# Patient Record
Sex: Female | Born: 1935 | Race: White | Hispanic: No | Smoking: Never smoker
Health system: Southern US, Community
[De-identification: ages and names within clinical notes are randomized; demographics above are authoritative.]

## PROBLEM LIST (undated history)

## (undated) DIAGNOSIS — Z9889 Other specified postprocedural states: Secondary | ICD-10-CM

## (undated) DIAGNOSIS — B059 Measles without complication: Secondary | ICD-10-CM

## (undated) DIAGNOSIS — K649 Unspecified hemorrhoids: Secondary | ICD-10-CM

## (undated) DIAGNOSIS — G5600 Carpal tunnel syndrome, unspecified upper limb: Secondary | ICD-10-CM

## (undated) DIAGNOSIS — M1990 Unspecified osteoarthritis, unspecified site: Secondary | ICD-10-CM

## (undated) DIAGNOSIS — Z9289 Personal history of other medical treatment: Secondary | ICD-10-CM

## (undated) DIAGNOSIS — T07XXXA Unspecified multiple injuries, initial encounter: Secondary | ICD-10-CM

## (undated) DIAGNOSIS — E039 Hypothyroidism, unspecified: Secondary | ICD-10-CM

## (undated) DIAGNOSIS — G479 Sleep disorder, unspecified: Secondary | ICD-10-CM

## (undated) DIAGNOSIS — E782 Mixed hyperlipidemia: Secondary | ICD-10-CM

## (undated) DIAGNOSIS — I4891 Unspecified atrial fibrillation: Secondary | ICD-10-CM

## (undated) DIAGNOSIS — M069 Rheumatoid arthritis, unspecified: Secondary | ICD-10-CM

## (undated) DIAGNOSIS — Z8709 Personal history of other diseases of the respiratory system: Secondary | ICD-10-CM

## (undated) DIAGNOSIS — B269 Mumps without complication: Secondary | ICD-10-CM

## (undated) DIAGNOSIS — I739 Peripheral vascular disease, unspecified: Secondary | ICD-10-CM

## (undated) DIAGNOSIS — E669 Obesity, unspecified: Secondary | ICD-10-CM

## (undated) DIAGNOSIS — I779 Disorder of arteries and arterioles, unspecified: Secondary | ICD-10-CM

## (undated) DIAGNOSIS — R112 Nausea with vomiting, unspecified: Secondary | ICD-10-CM

## (undated) DIAGNOSIS — Z8719 Personal history of other diseases of the digestive system: Secondary | ICD-10-CM

## (undated) DIAGNOSIS — K219 Gastro-esophageal reflux disease without esophagitis: Secondary | ICD-10-CM

## (undated) DIAGNOSIS — D649 Anemia, unspecified: Secondary | ICD-10-CM

## (undated) DIAGNOSIS — M5135 Other intervertebral disc degeneration, thoracolumbar region: Secondary | ICD-10-CM

## (undated) HISTORY — DX: Disorder of arteries and arterioles, unspecified: I77.9

## (undated) HISTORY — DX: Peripheral vascular disease, unspecified: I73.9

## (undated) HISTORY — DX: Personal history of other medical treatment: Z92.89

## (undated) HISTORY — PX: TUBAL LIGATION: SHX77

## (undated) HISTORY — PX: APPENDECTOMY: SHX54

## (undated) HISTORY — DX: Mixed hyperlipidemia: E78.2

## (undated) HISTORY — DX: Personal history of other diseases of the digestive system: Z87.19

## (undated) HISTORY — DX: Other intervertebral disc degeneration, thoracolumbar region: M51.35

## (undated) HISTORY — PX: JOINT REPLACEMENT: SHX530

## (undated) HISTORY — DX: Unspecified atrial fibrillation: I48.91

## (undated) HISTORY — DX: Obesity, unspecified: E66.9

## (undated) HISTORY — PX: ROTATOR CUFF REPAIR: SHX139

## (undated) HISTORY — PX: TONSILLECTOMY: SUR1361

---

## 1995-07-19 ENCOUNTER — Encounter: Payer: Self-pay | Admitting: Cardiology

## 2000-05-24 ENCOUNTER — Encounter: Payer: Self-pay | Admitting: Orthopedic Surgery

## 2000-05-30 ENCOUNTER — Inpatient Hospital Stay (HOSPITAL_COMMUNITY): Admission: RE | Admit: 2000-05-30 | Discharge: 2000-05-31 | Payer: Self-pay | Admitting: Orthopedic Surgery

## 2001-08-05 ENCOUNTER — Emergency Department (HOSPITAL_COMMUNITY): Admission: EM | Admit: 2001-08-05 | Discharge: 2001-08-05 | Payer: Self-pay | Admitting: Internal Medicine

## 2005-03-26 ENCOUNTER — Inpatient Hospital Stay (HOSPITAL_COMMUNITY): Admission: EM | Admit: 2005-03-26 | Discharge: 2005-03-29 | Payer: Self-pay | Admitting: Emergency Medicine

## 2005-03-27 ENCOUNTER — Ambulatory Visit: Payer: Self-pay | Admitting: Internal Medicine

## 2005-03-28 ENCOUNTER — Encounter (INDEPENDENT_AMBULATORY_CARE_PROVIDER_SITE_OTHER): Payer: Self-pay | Admitting: Specialist

## 2005-03-28 HISTORY — PX: OTHER SURGICAL HISTORY: SHX169

## 2005-04-01 ENCOUNTER — Ambulatory Visit: Payer: Self-pay | Admitting: Internal Medicine

## 2005-04-08 ENCOUNTER — Ambulatory Visit (HOSPITAL_COMMUNITY): Admission: RE | Admit: 2005-04-08 | Discharge: 2005-04-08 | Payer: Self-pay | Admitting: Internal Medicine

## 2005-04-20 ENCOUNTER — Ambulatory Visit (HOSPITAL_COMMUNITY): Admission: RE | Admit: 2005-04-20 | Discharge: 2005-04-20 | Payer: Self-pay | Admitting: Internal Medicine

## 2005-04-20 ENCOUNTER — Ambulatory Visit (HOSPITAL_COMMUNITY): Admission: RE | Admit: 2005-04-20 | Discharge: 2005-04-20 | Payer: Self-pay | Admitting: Surgery

## 2005-04-29 HISTORY — PX: CHOLECYSTECTOMY: SHX55

## 2005-05-25 ENCOUNTER — Ambulatory Visit (HOSPITAL_COMMUNITY): Admission: RE | Admit: 2005-05-25 | Discharge: 2005-05-26 | Payer: Self-pay | Admitting: Surgery

## 2005-05-25 ENCOUNTER — Encounter (INDEPENDENT_AMBULATORY_CARE_PROVIDER_SITE_OTHER): Payer: Self-pay | Admitting: *Deleted

## 2005-05-25 HISTORY — PX: VENTRAL HERNIA REPAIR: SHX424

## 2005-06-02 ENCOUNTER — Encounter: Admission: RE | Admit: 2005-06-02 | Discharge: 2005-06-02 | Payer: Self-pay | Admitting: Surgery

## 2005-06-26 HISTORY — PX: THORACOTOMY: SUR1349

## 2005-07-08 ENCOUNTER — Encounter: Payer: Self-pay | Admitting: Internal Medicine

## 2005-07-08 ENCOUNTER — Encounter: Payer: Self-pay | Admitting: Cardiothoracic Surgery

## 2005-07-08 ENCOUNTER — Ambulatory Visit: Payer: Self-pay | Admitting: Internal Medicine

## 2005-07-08 ENCOUNTER — Ambulatory Visit (HOSPITAL_COMMUNITY): Admission: RE | Admit: 2005-07-08 | Discharge: 2005-07-08 | Payer: Self-pay | Admitting: Cardiothoracic Surgery

## 2005-07-12 ENCOUNTER — Encounter (INDEPENDENT_AMBULATORY_CARE_PROVIDER_SITE_OTHER): Payer: Self-pay | Admitting: *Deleted

## 2005-07-12 ENCOUNTER — Inpatient Hospital Stay (HOSPITAL_COMMUNITY): Admission: RE | Admit: 2005-07-12 | Discharge: 2005-07-17 | Payer: Self-pay | Admitting: Cardiothoracic Surgery

## 2005-07-19 ENCOUNTER — Ambulatory Visit: Payer: Self-pay | Admitting: Cardiology

## 2005-07-19 ENCOUNTER — Inpatient Hospital Stay (HOSPITAL_COMMUNITY): Admission: EM | Admit: 2005-07-19 | Discharge: 2005-07-23 | Payer: Self-pay | Admitting: Emergency Medicine

## 2005-07-28 ENCOUNTER — Ambulatory Visit: Payer: Self-pay | Admitting: Cardiology

## 2005-08-05 ENCOUNTER — Encounter: Admission: RE | Admit: 2005-08-05 | Discharge: 2005-08-05 | Payer: Self-pay | Admitting: Cardiothoracic Surgery

## 2005-08-23 ENCOUNTER — Ambulatory Visit: Payer: Self-pay | Admitting: Cardiology

## 2005-09-06 ENCOUNTER — Ambulatory Visit: Payer: Self-pay | Admitting: Cardiology

## 2005-10-06 ENCOUNTER — Ambulatory Visit: Payer: Self-pay | Admitting: Family Medicine

## 2005-10-11 ENCOUNTER — Encounter: Admission: RE | Admit: 2005-10-11 | Discharge: 2005-10-11 | Payer: Self-pay | Admitting: Surgery

## 2005-12-16 ENCOUNTER — Encounter: Admission: RE | Admit: 2005-12-16 | Discharge: 2005-12-16 | Payer: Self-pay | Admitting: Cardiothoracic Surgery

## 2005-12-28 ENCOUNTER — Ambulatory Visit: Payer: Self-pay | Admitting: Cardiology

## 2006-06-23 ENCOUNTER — Encounter: Admission: RE | Admit: 2006-06-23 | Discharge: 2006-06-23 | Payer: Self-pay | Admitting: Cardiothoracic Surgery

## 2006-06-23 ENCOUNTER — Ambulatory Visit: Payer: Self-pay | Admitting: Cardiothoracic Surgery

## 2007-05-18 ENCOUNTER — Ambulatory Visit: Payer: Self-pay | Admitting: Cardiology

## 2007-12-14 ENCOUNTER — Encounter: Payer: Self-pay | Admitting: Cardiology

## 2007-12-15 ENCOUNTER — Encounter: Payer: Self-pay | Admitting: Cardiology

## 2007-12-17 ENCOUNTER — Encounter: Payer: Self-pay | Admitting: Cardiology

## 2007-12-22 ENCOUNTER — Encounter: Payer: Self-pay | Admitting: Cardiology

## 2008-04-11 ENCOUNTER — Inpatient Hospital Stay (HOSPITAL_COMMUNITY): Admission: AD | Admit: 2008-04-11 | Discharge: 2008-04-12 | Payer: Self-pay | Admitting: Obstetrics and Gynecology

## 2008-07-10 ENCOUNTER — Emergency Department (HOSPITAL_COMMUNITY): Admission: EM | Admit: 2008-07-10 | Discharge: 2008-07-10 | Payer: Self-pay | Admitting: Emergency Medicine

## 2008-11-10 DIAGNOSIS — E785 Hyperlipidemia, unspecified: Secondary | ICD-10-CM | POA: Insufficient documentation

## 2008-11-11 DIAGNOSIS — I4891 Unspecified atrial fibrillation: Secondary | ICD-10-CM | POA: Insufficient documentation

## 2008-11-11 DIAGNOSIS — I10 Essential (primary) hypertension: Secondary | ICD-10-CM

## 2008-11-12 ENCOUNTER — Encounter: Payer: Self-pay | Admitting: Cardiology

## 2008-11-12 ENCOUNTER — Ambulatory Visit: Payer: Self-pay | Admitting: Cardiology

## 2008-11-12 DIAGNOSIS — L301 Dyshidrosis [pompholyx]: Secondary | ICD-10-CM

## 2008-11-12 DIAGNOSIS — R0989 Other specified symptoms and signs involving the circulatory and respiratory systems: Secondary | ICD-10-CM

## 2008-11-20 ENCOUNTER — Ambulatory Visit: Payer: Self-pay | Admitting: Cardiology

## 2008-11-20 ENCOUNTER — Ambulatory Visit: Payer: Self-pay

## 2008-11-26 ENCOUNTER — Encounter: Payer: Self-pay | Admitting: Cardiology

## 2008-11-28 LAB — CONVERTED CEMR LAB
Cholesterol: 197 mg/dL (ref 0–200)
HDL: 24.3 mg/dL — ABNORMAL LOW (ref >39.00)

## 2008-12-03 ENCOUNTER — Telehealth (INDEPENDENT_AMBULATORY_CARE_PROVIDER_SITE_OTHER): Payer: Self-pay | Admitting: *Deleted

## 2009-06-01 ENCOUNTER — Telehealth (INDEPENDENT_AMBULATORY_CARE_PROVIDER_SITE_OTHER): Payer: Self-pay | Admitting: *Deleted

## 2009-06-01 ENCOUNTER — Ambulatory Visit: Payer: Self-pay | Admitting: Cardiology

## 2009-06-01 DIAGNOSIS — R21 Rash and other nonspecific skin eruption: Secondary | ICD-10-CM | POA: Insufficient documentation

## 2009-06-18 ENCOUNTER — Ambulatory Visit: Payer: Self-pay | Admitting: Vascular Surgery

## 2009-11-23 ENCOUNTER — Encounter: Payer: Self-pay | Admitting: Cardiology

## 2010-01-14 ENCOUNTER — Telehealth (INDEPENDENT_AMBULATORY_CARE_PROVIDER_SITE_OTHER): Payer: Self-pay | Admitting: *Deleted

## 2010-03-15 ENCOUNTER — Encounter: Payer: Self-pay | Admitting: Cardiology

## 2010-03-15 ENCOUNTER — Ambulatory Visit: Payer: Self-pay | Admitting: Cardiology

## 2010-03-15 DIAGNOSIS — I669 Occlusion and stenosis of unspecified cerebral artery: Secondary | ICD-10-CM | POA: Insufficient documentation

## 2010-03-23 ENCOUNTER — Encounter: Payer: Self-pay | Admitting: Cardiology

## 2010-03-31 ENCOUNTER — Encounter (INDEPENDENT_AMBULATORY_CARE_PROVIDER_SITE_OTHER): Payer: Self-pay | Admitting: *Deleted

## 2010-04-07 ENCOUNTER — Telehealth (INDEPENDENT_AMBULATORY_CARE_PROVIDER_SITE_OTHER): Payer: Self-pay | Admitting: *Deleted

## 2010-04-18 ENCOUNTER — Encounter: Payer: Self-pay | Admitting: Internal Medicine

## 2010-04-18 ENCOUNTER — Encounter: Payer: Self-pay | Admitting: Surgery

## 2010-04-20 ENCOUNTER — Encounter (INDEPENDENT_AMBULATORY_CARE_PROVIDER_SITE_OTHER): Payer: Self-pay | Admitting: *Deleted

## 2010-04-21 ENCOUNTER — Ambulatory Visit: Admit: 2010-04-21 | Payer: Self-pay | Admitting: Cardiology

## 2010-04-27 NOTE — Assessment & Plan Note (Signed)
Visit Type:  Follow-up Primary Provider:  [facility]  CC:  follow-up visit.  History of Present Illness: the patient is a 56 her old female with history of paroxysmal atrial fibrillation.  The patient reports no recurrent palpitations, presyncope or syncope.  She remains in normal sinus rhythm.  She reports a rash and is wondering whetheris secondary from simvastatin or vitamin A and she's taking period  The patient denies any chest pain orthopnea PND.  Clinical Review Panels:  Lipid Levels LDL 141 (11/20/2008) HDL 24.30 (11/20/2008) Cholesterol 197 (11/20/2008)  CXR CXR results Normal mediastinum and cardiac silhouette.  Costophrenic         angles are clear.  There is coarsening of the central         bronchovascular markings suggest bronchitic change.  No evidence of         focal consolidation.  Surgical clips in the mediastinum.                   IMPRESSION:                   1..  No acute cardiopulmonary process.  Two.  Bronchitic change. (12/14/2007)  Carotid Studies Carotid Doppler Results moderate soft plaque in the right internal carotid artery.  60 to 79% stenosis in the RICA, zero to 39% stenosis in the LICA. (11/20/2008)   EKG  Procedure date:  06/01/2009  Findings:      normal sinus rhythm.  Nonspecific ST-T wave changes.  Heart rate 69 beats/min  Carotid Doppler  Procedure date:  11/20/2008  Findings:      moderate soft plaque in the right internal carotid artery.  60 to 79% stenosis in the RICA, zero to 39% stenosis in the LICA.   Preventive Screening-Counseling & Management  Alcohol-Tobacco     Smoking Status: never  Current Medications (verified): 1)  Alprazolam 0.5 Mg Tabs (Alprazolam) .... 2 Daily 2)  Synthroid 175 Mcg Tabs (Levothyroxine Sodium) .... Once Daily 3)  Detrol La 4 Mg Xr24h-Cap (Tolterodine Tartrate) .... Once Daily 4)  Oxaprozin 600 Mg Tabs (Oxaprozin) .... Two Times A Day 5)  Aspirin 81 Mg Tbec (Aspirin) .... Take One Tablet By  Mouth Daily 6)  Multivitamins   Tabs (Multiple Vitamin) .... Once Daily 7)  Nexium 40 Mg Pack (Esomeprazole Magnesium) .... As Needed 8)  Vitamin C 500 Mg  Tabs (Ascorbic Acid) .... Once Daily 9)  Fish Oil   Oil (Fish Oil) .... 1000mg  2 Daily 10)  Vitamin D3 2000 Unit Caps (Cholecalciferol) .... Once Daily 11)  Garlic Oil 1000 Mg Caps (Garlic) .... Once Daily 12)  Triamcinolone Acetonide 0.1 % Crea (Triamcinolone Acetonide) .... Apply Topically To Toes Once To Twice Per Day. 13)  Simvastatin 20 Mg Tabs (Simvastatin) .... Take One Tablet By Mouth Daily At Bedtime 14)  Zyrtec Allergy 10 Mg Caps (Cetirizine Hcl) .... Take 1 Tablet By Mouth Once A Day  Allergies (verified): 1)  ! Pcn 2)  ! Morphine  Comments:  Nurse/Medical Assistant: The patient is currently on medications but does not know the name or dosage at this time. Instructed to contact our office with details. Will update medication list at that time.  Past History:  Past Medical History: HYPERTENSION, UNSPECIFIED (ICD-401.9) HYPERLIPIDEMIA-MIXED (ICD-272.4) ATRIAL FIBRILLATION (ICD-427.31) carotid artery disease    Review of Systems  The patient denies fatigue, malaise, fever, weight gain/loss, vision loss, decreased hearing, hoarseness, chest pain, palpitations, shortness of breath, prolonged cough, wheezing, sleep  apnea, coughing up blood, abdominal pain, blood in stool, nausea, vomiting, diarrhea, heartburn, incontinence, blood in urine, muscle weakness, joint pain, leg swelling, rash, skin lesions, headache, fainting, dizziness, depression, anxiety, enlarged lymph nodes, easy bruising or bleeding, and environmental allergies.    Vital Signs:  Patient profile:   77 year old female Height:      65 inches Weight:      169 pounds BMI:     28.22 Pulse rate:   71 / minute BP sitting:   143 / 84  (left arm) Cuff size:   regular  Vitals Entered By: Carlye Grippe (June 01, 2009 10:33 AM)  Nutrition Counseling:  Patient's BMI is greater than 25 and therefore counseled on weight management options. CC: follow-up visit   Physical Exam  Additional Exam:  General: Well-developed, well-nourished in no distress head: Normocephalic and atraumatic eyes PERRLA/EOMI intact, conjunctiva and lids normal nose: No deformity or lesions mouth normal dentition, normal posterior pharynx neck: Supple, no JVD.  No masses, thyromegaly or abnormal cervical nodes lungs: Normal breath sounds bilaterally without wheezing.  Normal percussion.  Right carotid bruit heart: regular rate and rhythm with normal S1 and S2, no S3 or S4.  PMI is normal.  No pathological murmurs abdomen: Normal bowel sounds, abdomen is soft and nontender without masses, organomegaly or hernias noted.  No hepatosplenomegaly musculoskeletal: Back normal, normal gait muscle strength and tone normal pulsus: Pulse is normal in all 4 extremities Extremities: No peripheral pitting edema neurologic: Alert and oriented x 3 skin: Intact without lesions or rashes cervical nodes: No significant adenopathy psychologic: Normal affect    Impression & Recommendations:  Problem # 1:  ATRIAL FIBRILLATION (ICD-427.31) the patient remains in normal sinus rhythm.  Continue current medical therapy. Her updated medication list for this problem includes:    Aspirin 81 Mg Tbec (Aspirin) .[name]... Take one tablet by mouth daily  Orders: EKG w/ Interpretation (93000)  Problem # 2:  CAROTID BRUIT, RIGHT (ICD-785.9) the patient is carotid Dopplers during the next clinic visit.  Her last Dopplers were done in August of 2010.  Problem # 3:  SKIN RASH (ICD-782.1) the patient reports a skin rash although currently there is no definite visible rash.  She thinks it may be related to simvastatin or vitamin A..  I asked her to stop the drugs and stop simvastatin for two weeks.  At that time resume his rash is persistent.  Problem # 4:  HYPERTENSION, UNSPECIFIED  (ICD-401.9) Assessment: Improved  Her updated medication list for this problem includes:    Aspirin 81 Mg Tbec (Aspirin) .[name]... Take one tablet by mouth daily  Patient Instructions: 1)  Carotid dopplers in August 2011 2)  Zyrtec 10mg  daily (OTC) 3)  Stop Simvastatin x 2 weeks, then may restart. 4)  Stop Vitamin A 5)  Follow up in  6 months.

## 2010-04-27 NOTE — Miscellaneous (Signed)
Summary: Orders Update  Clinical Lists Changes  Orders: Added new Test order of Carotid Duplex (Carotid Duplex) - Signed 

## 2010-04-27 NOTE — Progress Notes (Signed)
Summary: PHONE  Phone Note Call from Patient Call back at Home Phone [phone]   Caller: Patient Summary of Call: Mrs. Lovern called in regards to an appt. On June 16, 2009 in [place]. States that Dr. Andee Lineman told her today on her visit to cancel this appt. and re-schedule for august.  Initial call taken by: Zachary George,  June 01, 2009 4:40 PM

## 2010-04-27 NOTE — Progress Notes (Signed)
Summary: flag on carotid  ---- Converted from flag ---- ---- 12/25/2009 5:07 PM, Maxwell Caul wrote: Inocencio Homes, patient apparently did no show for her appointment.   ---- 12/24/2009 4:38 PM, Hoover Brunette, LPN wrote: Working on my procedure log - Please advise on order.  Has this been scheduled or do we need to do something with this.   thanks ------------------------------

## 2010-04-29 NOTE — Assessment & Plan Note (Signed)
Summary: 6 MO FUL   Visit Type:  Follow-up Primary Provider:  [facility]   History of Present Illness: patient is a 77 year old female with a history ofparoxysmal atrial fibrillation.she reports no recurrence. She's been doing well. She denies any chest pain short of breath. She's been very busy preparing Christmas dinner. She gets around pretty good about any symptoms. However blood pressures markedly elevated in the office today. She does not have a blood pressure cuff at home and it's unknown mother home readings are. She does have a history of carotid artery disease with significant right internal carotid artery stenosis of 60-79%.the patient's history of a defibrillation somewhat sketchy. She's currently not on Coumadin.the patient also was initially seen at [facility] and was hospitalized for a week because of intractable vomiting. Currently the patient has some adhesions but no surgery was performed.  Preventive Screening-Counseling & Management  Alcohol-Tobacco     Smoking Status: never  Current Medications (verified): 1)  Alprazolam 0.5 Mg Tabs (Alprazolam) .... Take 1 Tablet By Mouth Two Times A Day As Needed 2)  Synthroid 175 Mcg Tabs (Levothyroxine Sodium) .... Take 1 Tablet By Mouth Once A Day 3)  Detrol La 4 Mg Xr24h-Cap (Tolterodine Tartrate) .... Take 1 Tablet By Mouth Once A Day 4)  Oxaprozin 600 Mg Tabs (Oxaprozin) .... Take 1 Tablet By Mouth Two Times A Day 5)  Aspirin 81 Mg Tbec (Aspirin) .... Take One Tablet By Mouth Daily 6)  Multivitamins   Tabs (Multiple Vitamin) .... Take 1 Tablet By Mouth Once A Day 7)  Nexium 40 Mg Pack (Esomeprazole Magnesium) .... Take 1 Tablet By Mouth Once A Day As Needed 8)  Vitamin C 500 Mg  Tabs (Ascorbic Acid) .... Take 1 Tablet By Mouth Once A Day 9)  Fish Oil 1000 Mg Caps (Omega-3 Fatty Acids) .... Take 1 Tablet By Mouth Once A Day 10)  Vitamin D3 1000 Unit Tabs (Cholecalciferol) .... Take 1 Tablet By Mouth Once A Day 11)  Garlic Oil  1000 Mg Caps (Garlic) .... Take 1 Tablet By Mouth Once A Day 12)  Simvastatin 20 Mg Tabs (Simvastatin) .... Take One Tablet By Mouth Daily At Bedtime 13)  Vicodin 5-500 Mg Tabs (Hydrocodone-Acetaminophen) .... Take One By Mouth Every Six Hours As Needed 14)  Premarin 0.625 Mg/gm Crea (Estrogens, Conjugated) .... Use As Directed 15)  Vitamin A 8000 Units .... Take 1 Tablet By Mouth Once A Day 16)  Promethazine Hcl 25 Mg Tabs (Promethazine Hcl) .... As Needed 17)  Cvs Digestive Probiotic  Caps (Probiotic Product) .... Take 3 Tablet By Mouth Once A Day 18)  Bd Assure Bpm/auto Arm Cuff  Misc (Blood Pressure Monitoring) .... Use As Directed  Allergies (verified): 1)  ! Pcn 2)  ! Morphine  Comments:  Nurse/Medical Assistant: The patient's medication list and allergies were reviewed with the patient and were updated in the Medication and Allergy Lists.   Past History:  Past Medical History: HYPERTENSION, UNSPECIFIED (ICD-401.9) HYPERLIPIDEMIA-MIXED (ICD-272.4) ATRIAL FIBRILLATION (ICD-427.31)unclear history carotid artery disease60-79% stenosis right internal carotid artery    Review of Systems       The patient complains of vomiting.  The patient denies fatigue, malaise, fever, weight gain/loss, vision loss, decreased hearing, hoarseness, chest pain, palpitations, shortness of breath, prolonged cough, wheezing, sleep apnea, coughing up blood, abdominal pain, blood in stool, nausea, diarrhea, heartburn, incontinence, blood in urine, muscle weakness, joint pain, leg swelling, rash, skin lesions, headache, fainting, dizziness, depression, anxiety, enlarged lymph nodes, easy  bruising or bleeding, and environmental allergies.    Vital Signs:  Patient profile:   77 year old female Height:      65 inches Weight:      157 pounds BMI:     26.22 Pulse rate:   74 / minute BP sitting:   171 / 90  (left arm) Cuff size:   regular  Vitals Entered By: Carlye Grippe (March 15, 2010 9:52  AM)  Nutrition Counseling: Patient's BMI is greater than 25 and therefore counseled on weight management options.  Physical Exam  Additional Exam:  General: Well-developed, well-nourished in no distress head: Normocephalic and atraumatic eyes PERRLA/EOMI intact, conjunctiva and lids normal nose: No deformity or lesions mouth normal dentition, normal posterior pharynx neck: Supple, no JVD.  No masses, thyromegaly or abnormal cervical nodes lungs: Normal breath sounds bilaterally without wheezing.  Normal percussion.  Right carotid bruit heart: regular rate and rhythm with normal S1 and S2, no S3 or S4.  PMI is normal.  No pathological murmurs abdomen: Normal bowel sounds, abdomen is soft and nontender without masses, organomegaly or hernias noted.  No hepatosplenomegaly musculoskeletal: Back normal, normal gait muscle strength and tone normal pulsus: Pulse is normal in all 4 extremities Extremities: No peripheral pitting edema neurologic: Alert and oriented x 3 skin: Intact without lesions or rashes cervical nodes: No significant adenopathy psychologic: Normal affect    EKG  Procedure date:  03/15/2010  Findings:      normal sinus rhythm. Heart rate 82 beats per minute no acute ischemic changes.  Impression & Recommendations:  Problem # 1:  CAROTID BRUIT, RIGHT (ICD-785.9) the patient needs follow up carotid Dopplers. We will schedule this before the end of the year. She may need referral to vascular surgery.  Problem # 2:  HYPERTENSION, UNSPECIFIED (ICD-401.9) blood pressure is uncontrolled for the patient's has white coat hypertension. However am unconvinced of this and agreed in order to obtain a blood pressure cuff. The patient will give [place] some readings in the next few weeks she also has 2 daughters who are nurses who will monitor her blood pressure. Based on this we will decide on what pressure management. Her updated medication list for this problem includes:    Aspirin  81 Mg Tbec (Aspirin) .[name]... Take one tablet by mouth daily  Problem # 3:  ATRIAL FIBRILLATION (ICD-427.31) no clear history of atrial fibrillation. The patient is currently not on Coumadin.I could not find anything regarding this in the old records. We will continue aspirin. Her updated medication list for this problem includes:    Aspirin 81 Mg Tbec (Aspirin) .[name]... Take one tablet by mouth daily  Orders: EKG w/ Interpretation (93000)  Other Orders: Carotid Duplex (Carotid Duplex)  Patient Instructions: 1)  bilateral carotid dopplers 2)  keep log of blood pressure reading and bring to office  3)  Follow up in  1 month Prescriptions: BD ASSURE BPM/AUTO ARM CUFF  MISC (BLOOD PRESSURE MONITORING) USE AS DIRECTED  #1 x 0   Entered by:   Hoover Brunette, LPN   Authorized by:   Lewayne Bunting, MD, [facility]   Signed by:   Hoover Brunette, LPN on 16/12/9602   Method used:   Print then Give to Patient   RxID:   [id number]

## 2010-04-29 NOTE — Letter (Signed)
Summary: Generic Engineer, agricultural at [facility] [address]   [place], [place] [address]   Phone: [phone]  Fax: [phone]        April 20, 2010 MRN: [id number]    [name] [name] [address] [place], [place]  [address]    Dear Ms. Cichowski,  Your appointment has be scheduled for Thursday, June 03, 2010 at 2:00 with Dr. Andee Lineman.    If you have any further questions, please call:  [phone].          Sincerely,  Hoover Brunette, LPN  This letter has been electronically signed by your physician.

## 2010-04-29 NOTE — Progress Notes (Signed)
Summary: BP readings   Phone Note Call from Patient Call back at Home Phone [phone]   Caller: Patient Summary of Call: Questions if she needs to keep 1 month f/u scheduled for 1/25.  States she was unable to purchase a blood pressure monitor and her daughter-in-laws live too far away to check for her.  States her vertigo has been really bothering her.  Larey Seat and broke left arm recently.  Had hard cast taken off today and replaced with black one she can take off and on herself.  On 12/19 (MD office)  133/71.  On 12/22 (setting arm) 146/79.  Today at visit for cast - 150/88.   Initial call taken by: Hoover Brunette, LPN,  April 07, 2010 5:11 PM  Follow-up for Phone Call        we can extend her appointment another 4-6 weeks. In the absence of any other readings would not start any additional treatment Follow-up by: Lewayne Bunting, MD, [facility],  April 07, 2010 6:06 PM  Additional Follow-up for Phone Call Additional follow up Details #1::        Left message to return call.  Hoover Brunette, LPN  April 16, 2010 5:18 PM     Additional Follow-up for Phone Call Additional follow up Details #2::    RETURNING YOUR CALL;......Marland KitchenPATIENT NEEDS TO KNOW  WHETHER OR NOT SHE NEEDS TO COME FOR APPOINTMENT.   Follow-up by: Claudette Laws,  April 19, 2010 2:42 PM  Additional Follow-up for Phone Call Additional follow up Details #3:: Details for Additional Follow-up Action Taken: .Patient notified.   Rescheduled OV for Thursday, March 8 at 2:00 with Dr. Andee Lineman.   Advised her to keep blood pressure readings if able to.  ie:  from MD office, pharm, or family member.  Stated that she did price some monitors at [facility] and they were too expensive (50.00-100.00).  Also, told her that she may find a cheaper one at [facility].   Hoover Brunette, LPN  April 20, 2010 9:14 AM

## 2010-04-29 NOTE — Letter (Signed)
Summary: Engineer, materials at [facility]  [address]   [place], [place] [address]   Phone: [phone]  Fax: [phone]        March 31, 2010 MRN: [id number]   [name] [name] [address] [place], [place]  [address]   Dear [name] [name],  Your test ordered by Selena Batten has been reviewed by your physician (or physician assistant) and was found to be normal or stable. Your physician (or physician assistant) felt no changes were needed at this time.  ____ Echocardiogram  ____ Cardiac Stress Test  ____ Lab Work  __X__ Peripheral vascular study of arms, legs or neck - moderate carotid artery disease, follow up carotid dopplers in one year.  Keep already scheduled appointment for end of January.   ____ CT scan or X-ray  ____ Lung or Breathing test  ____ Other:   Thank you.   Hoover Brunette, LPN    Duane Boston, M.D., F.A.C.C. Thressa Sheller, M.D., F.A.C.C. Oneal Grout, M.D., F.A.C.C. Cheree Ditto, M.D., F.A.C.C. Daiva Nakayama, M.D., F.A.C.C. Kenney Houseman, M.D., F.A.C.C. Jeanne Ivan, PA-C

## 2010-05-27 ENCOUNTER — Encounter: Payer: Self-pay | Admitting: Physician Assistant

## 2010-06-03 ENCOUNTER — Encounter: Payer: Self-pay | Admitting: Physician Assistant

## 2010-06-03 ENCOUNTER — Ambulatory Visit (INDEPENDENT_AMBULATORY_CARE_PROVIDER_SITE_OTHER): Payer: Medicare Other | Admitting: Cardiology

## 2010-06-03 DIAGNOSIS — I10 Essential (primary) hypertension: Secondary | ICD-10-CM

## 2010-06-03 DIAGNOSIS — I6529 Occlusion and stenosis of unspecified carotid artery: Secondary | ICD-10-CM | POA: Insufficient documentation

## 2010-06-03 DIAGNOSIS — I4891 Unspecified atrial fibrillation: Secondary | ICD-10-CM

## 2010-06-15 NOTE — Assessment & Plan Note (Signed)
Summary: ROV-F/U ON BP --[facility]   Visit Type:  Follow-up Primary Provider:  [facility]   History of Present Illness: patient presents for scheduled followup.  When last seen, she was referred for surveillance carotid Dopplers. This yielded stable, moderate disease with 50-69% right ICA, and less than 50% left ICA stenosis.   Of note, patient has history of postop PAF, following a right thoracotomy procedure, April 2007, for resection of a large bronchogenic cyst. A recent TSH level at that time was normal. Of note, patient reports today that she did not experience any palpitations at that time, nor since that episode.  Since her recent visit this past December, patient denies interim development of exertional angina pectoris, DOE, or tachypalpitations. In fact, she was plowing several rows yesterday for a potato patch, without difficulty. She continues to live alone, and is quite self-sufficient.  Preventive Screening-Counseling & Management  Alcohol-Tobacco     Smoking Status: never  Current Medications (verified): 1)  Alprazolam 0.5 Mg Tabs (Alprazolam) .... Take 1 Tablet By Mouth Two Times A Day As Needed 2)  Synthroid 175 Mcg Tabs (Levothyroxine Sodium) .... Take 1 Tablet By Mouth Once A Day 3)  Detrol La 4 Mg Xr24h-Cap (Tolterodine Tartrate) .... Take 1 Tablet By Mouth Once A Day 4)  Oxaprozin 600 Mg Tabs (Oxaprozin) .... Take 1 Tablet By Mouth Two Times A Day 5)  Aspirin 81 Mg Tbec (Aspirin) .... Take One Tablet By Mouth Daily 6)  Multivitamins   Tabs (Multiple Vitamin) .... Take 1 Tablet By Mouth Once A Day 7)  Nexium 40 Mg Pack (Esomeprazole Magnesium) .... Take 1 Tablet By Mouth Once A Day As Needed 8)  Vitamin C 500 Mg  Tabs (Ascorbic Acid) .... Take 1 Tablet By Mouth Once A Day 9)  Fish Oil 1000 Mg Caps (Omega-3 Fatty Acids) .... Take 1 Tablet By Mouth Once A Day 10)  Vitamin D3 1000 Unit Tabs (Cholecalciferol) .... Take 1 Tablet By Mouth Once A Day 11)  Garlic Oil 1000 Mg Caps  (Garlic) .... Take 1 Tablet By Mouth Once A Day 12)  Simvastatin 20 Mg Tabs (Simvastatin) .... Take One Tablet By Mouth Daily At Bedtime 13)  Vicodin 5-500 Mg Tabs (Hydrocodone-Acetaminophen) .... Take One By Mouth Every Six Hours As Needed 14)  Premarin 0.625 Mg/gm Crea (Estrogens, Conjugated) .... Use As Directed 15)  Vitamin A 8000 Units .... Take 1 Tablet By Mouth Once A Day 16)  Promethazine Hcl 25 Mg Tabs (Promethazine Hcl) .... As Needed 17)  Cvs Digestive Probiotic  Caps (Probiotic Product) .... Take 3 Tablet By Mouth Once A Day 18)  Bd Assure Bpm/auto Arm Cuff  Misc (Blood Pressure Monitoring) .... Use As Directed  Allergies (verified): 1)  ! Pcn 2)  ! Morphine  Comments:  Nurse/Medical Assistant: The patient's medication list and allergies were reviewed with the patient and were updated in the Medication and Allergy Lists.  Past History:  Past Medical History: HYPERTENSION, UNSPECIFIED (ICD-401.9) HYPERLIPIDEMIA-MIXED (ICD-272.4) ATRIAL FIBRILLATION (ICD-427.31)... postop, 4/07, status post thoracotomy (bronchogenic cyst), treated with      amiodarone...current CHADS: 2 (HTN, age) carotid artery disease 50-69% stenosis R. ICA, 12/11  Review of Systems       No fevers, chills, hemoptysis, dysphagia, melena, hematocheezia, hematuria, rash, claudication, orthopnea, pnd, pedal edema. All other systems negative.   Vital Signs:  Patient profile:   77 year old female Height:      65 inches Weight:      155 pounds  Pulse rate:   70 / minute BP sitting:   125 / 81  (left arm) Cuff size:   large  Vitals Entered By: Carlye Grippe (June 03, 2010 2:22 PM)  Physical Exam  Additional Exam:  GEN: 77 year old female, no distress HEENT: NCAT,PERRLA,EOMI NECK: palpable pulses, no bruits; no JVD; no TM LUNGS: CTA bilaterally HEART: RRR (S1S2); no significant murmurs; no rubs; no gallops ABD: soft, NT; intact BS EXT: intact distal pulses; no edema SKIN: warm, dry MUSC: no  obvious deformity NEURO: A/O (x3)     EKG  Procedure date:  06/03/2010  Findings:      normal sinus rhythm 68 bpm; normal axis; nonspecific ST changes;  Impression & Recommendations:  Problem # 1:  I willATRIAL FIBRILLATION (ICD-427.31)  no documented recurrence since 2009. Patient has a current CHADS score of 2 (HTN, age). Continue low-dose aspirin, in the absence of any evidence of recurrent PAF. Schedule return visit with Dr. Andee Lineman in 6 months.  Problem # 2:  CAROTID ARTERY DISEASE (ICD-433.10)  stable, moderate disease by recent Dopplers, 12/11. Recommend repeat in one year.  Problem # 3:  HYPERTENSION, UNSPECIFIED (ICD-401.9)  currently normotensive, on no medications. Continue monitoring closely in the ambulatory setting, for suspected "white coat" HTN.  Problem # 4:  HYPERLIPIDEMIA-MIXED (ICD-272.4)  followed by Dr. Elana Alm, [facility]. Recommend aggressive management with target LDL 70 or less, given documented CVD.  Other Orders: EKG w/ Interpretation (93000)  Patient Instructions: 1)  Your physician recommends that you continue on your current medications as directed. Please refer to the Current Medication list given to you today. 2)  Follow up in  6 months

## 2010-07-07 LAB — CBC
Hemoglobin: 12.3 g/dL (ref 12.0–15.0)
Platelets: 200 10*3/uL (ref 150–400)
WBC: 9.1 10*3/uL (ref 4.0–10.5)

## 2010-07-07 LAB — DIFFERENTIAL
Basophils Relative: 0 % (ref 0–1)
Eosinophils Absolute: 0.1 10*3/uL (ref 0.0–0.7)
Lymphocytes Relative: 17 % (ref 12–46)
Lymphs Abs: 1.6 10*3/uL (ref 0.7–4.0)
Monocytes Relative: 11 % (ref 3–12)
Neutro Abs: 6.5 10*3/uL (ref 1.7–7.7)

## 2010-07-07 LAB — URINALYSIS, ROUTINE W REFLEX MICROSCOPIC
Glucose, UA: NEGATIVE mg/dL
Ketones, ur: 15 mg/dL — AB
Protein, ur: NEGATIVE mg/dL

## 2010-07-07 LAB — BASIC METABOLIC PANEL
BUN: 13 mg/dL (ref 6–23)
CO2: 23 mEq/L (ref 19–32)
Creatinine, Ser: 0.68 mg/dL (ref 0.4–1.2)
Potassium: 3.6 mEq/L (ref 3.5–5.1)

## 2010-07-12 LAB — URINALYSIS, ROUTINE W REFLEX MICROSCOPIC
Bilirubin Urine: NEGATIVE
Hgb urine dipstick: NEGATIVE
Protein, ur: NEGATIVE mg/dL
Specific Gravity, Urine: 1.01 (ref 1.005–1.030)
Urobilinogen, UA: 0.2 mg/dL (ref 0.0–1.0)

## 2010-07-12 LAB — COMPREHENSIVE METABOLIC PANEL
ALT: 12 U/L (ref 0–35)
CO2: 26 mEq/L (ref 19–32)
Calcium: 9.2 mg/dL (ref 8.4–10.5)
GFR calc non Af Amer: >60 mL/min (ref >60)
Glucose, Bld: 97 mg/dL (ref 70–99)
Sodium: 138 mEq/L (ref 135–145)

## 2010-07-12 LAB — CBC
Hemoglobin: 12.4 g/dL (ref 12.0–15.0)
MCHC: 33.5 g/dL (ref 30.0–36.0)
MCV: 92.2 fL (ref 78.0–100.0)
RBC: 4.02 MIL/uL (ref 3.87–5.11)

## 2010-07-12 LAB — DIFFERENTIAL
Basophils Relative: 0 % (ref 0–1)
Eosinophils Absolute: 0 10*3/uL (ref 0.0–0.7)
Eosinophils Relative: 0 % (ref 0–5)
Monocytes Absolute: 1.1 10*3/uL — ABNORMAL HIGH (ref 0.1–1.0)
Monocytes Relative: 11 % (ref 3–12)

## 2010-08-10 NOTE — Discharge Summary (Signed)
[name] [name], [name] [name]                 ACCOUNT NO.:  [id number]   MEDICAL RECORD NO.:  [id number]          PATIENT TYPE:  INP   LOCATION:  1532                         FACILITY:  [facility]   PHYSICIAN:  Hillery Aldo, M.D.   DATE OF BIRTH:  07-06-1930   DATE OF ADMISSION:  04/11/2008  DATE OF DISCHARGE:  04/12/2008                               DISCHARGE SUMMARY   PRIMARY CARE PHYSICIAN:  Dr. Robynn Pane.   DISCHARGE DIAGNOSES:  1. Abdominal pain, likely due to transient small-bowel obstruction.  2. Mild hypokalemia.  3. Nausea and vomiting, resolved.  4. History of paroxysmal atrial fibrillation.  5. Borderline hypertension.  6. Dyslipidemia.  7. Hypothyroidism.  8. Osteoarthritis.  9. Gastroesophageal reflux disease/hiatal hernia.  10.Mild sigmoid diverticulosis.   DISCHARGE MEDICATIONS:  1. Synthroid 175 mcg daily.  2. Alprazolam 0.5 mg q.h.s.  3. Nexium 40 mg daily.  4. Aspirin 81 mg daily.  5. Hydrocodone 5/500 one tablet q.6h. p.r.n.  6. Detrol LA 4 mg daily.  7. Oxaprozin 600 mg 2 tablets daily.   CONSULTATIONS:  None.   BRIEF ADMISSION HPI:  Patient is a 77 year old female who was originally  evaluated appointments hospital for abdominal pain.  She underwent  abdominal imaging there including a CT scan and an abdominal ultrasound  which was essentially unremarkable.  She was transferred to [facility] for internal medicine evaluation.  The patient was completely  symptom free by the time she arrived and was admitted for further  evaluation and workup.  For full details, please see the dictated report  done by Dr. Glade Lloyd.   PROCEDURES AND DIAGNOSTIC STUDIES:  1. Pelvic and transvaginal ultrasound done on April 10, 2008 was      unremarkable.  2. CT scan of the abdomen and pelvis on April 11, 2008 was stable      with no acute findings.  Mild sigmoid diverticulosis.  No acute      findings.   DISCHARGE LABORATORY VALUES:  The patient had no further lab work  done  while in the hospital.  Her admission lab work did show a potassium of  3.4 and the patient was supplemented with potassium prior to discharge.  Otherwise, her CBC, comprehensive metabolic panel, and urinalysis on  admission were unremarkable.   HOSPITAL COURSE BY PROBLEM:  1. Abdominal pain:  The patient's abdominal pain essentially was      resolved upon transfer.  A review of her CT scans, abdominal      ultrasound, and laboratory findings did not reveal any worrisome      findings.  She had no leukocytosis.  Her diet was advanced which      she tolerated without any difficulty.  Her bowels moved prior to      discharge.  Given her history of abdominal surgeries in the past,      including laparoscopic repair of an incarcerated ventral hernia and      lysis of adhesions as well as cholecystectomy in February of 2007,      the patient was felt to have likely suffered with  a transient small-      bowel obstruction that had resolved by the time CT scan was done.      She had no evidence of nephrolithiasis on the CT scan and with no      blood in her urine, having passed a kidney stone is not likely.      The patient was counseled that if her pain returns, she still would      need a further hospital evaluation given that she is at high risk      for recurrent problems with small bowel obstruction given her      abdominal surgical history.  Nevertheless, the patient's symptoms      have resolved and she is eating and drinking without difficulty and      her bowels are moving and her abdominal pain has resolved with the      exception of some minor residual soreness in the right lower      quadrant which she feels is likely due to muscle strain from      vomiting.  The patient will be sent home with a prescription for      Vicodin to use as needed for abdominal soreness but has been fully      apprised that she should return to the hospital should her symptoms      return.   All  the patient's other medical problems have remained stable.  Again,  she was given a potassium supplement of 20 mEq prior to discharge.   DISPOSITION:  The patient is medically stable and will be discharged  home.  She is to follow up with her primary care physician, Dr. Robynn Pane,  in 1-2 weeks and she is to call for an appointment time.   Time spent coordinating care for discharge and discharge instructions,  including speaking with the patient's mother-in-law, equaled 35 minutes.      Hillery Aldo, M.D.  Electronically Signed     CR/MEDQ  D:  04/12/2008  T:  04/13/2008  Job:  [id number]   cc:   Robynn Pane, MD

## 2010-08-10 NOTE — Procedures (Signed)
DUPLEX DEEP VENOUS EXAM - LOWER EXTREMITY   INDICATION:  Left leg pain.   HISTORY:  Edema:  Yes.  Trauma/Surgery:  No.  Pain:  Yes.  PE:  No.  Previous DVT:  No.  Anticoagulants:  No.  Other:   DUPLEX EXAM:                CFV   SFV   PopV  PTV    GSV                R  L  R  L  R  L  R   L  R  L  Thrombosis    o  o     o     o      o     o  Spontaneous   +  +     +     +      +     +  Phasic        +  +     +     +      +     +  Augmentation  +  +     +     +      +     +  Compressible  +  +     +     +      +     +  Competent     +  +     +     +      +     +   Legend:  + - yes  o - no  p - partial  D - decreased   IMPRESSION:  There does not appear to be any deep venous thrombus noted  in the left leg.    _____________________________  Di Kindle. Edilia Bo, M.D.   CB/MEDQ  D:  06/18/2009  T:  06/19/2009  Job:  [id number]

## 2010-08-10 NOTE — Assessment & Plan Note (Signed)
[facility]                          [facility] OFFICE NOTE   NAME:[name], [name]                        MRN:          [id number]  DATE:05/18/2007                            DOB:          September 21, 1930    PRIMARY CARDIOLOGIST:  Dr. Lewayne Bunting   REASON FOR VISIT:  Annual follow-up.   [name] [name] is a very pleasant 77 year old female with history of  postoperative atrial fibrillation, who now returns for annual follow-up.  Since last seen here in the clinic in May 2007 by Dr. Andee Lineman, she  reports no interim development of tachypalpitations.  She was in normal  sinus rhythm at time of her last office visit and was felt to be a  candidate for treatment with aspirin alone, rather than be started on  Coumadin.  In fact, she has a CHAD2 score of only 1.   As when last seen here in 2007, she continues to remain in normal sinus  rhythm by electrocardiogram today.   The patient has no history of ischemic heart disease.  A 2-D  echocardiogram in April 2007 was normal.  She has not, however, had any  formal perfusion imaging testing.  However, she reports no prior history  of exertional angina pectoris or dyspnea.   The patient is limited in her ambulation, secondary to apparent  significant arthritis of the knees.  She ambulates with the use of a  cane.  She informs me that she has been told that she might benefit from  a partial replacement of her right knee.  However, she has been  reluctant to do so.   Electrocardiogram today reveals NSR at 71 BPM with borderline left axis  deviation and nonspecific ST abnormalities.   CURRENT MEDICATIONS:  1. Aspirin 81 daily.  2. Detrol LA 4 mg daily.  3. Thyroxine 0.175 mg daily.  4. Alprazolam 0.5 mg q.h.s.  5. Prevacid 30 mg daily.   PHYSICAL EXAMINATION:  Blood pressure 150/77, pulse 70 and regular,  weight 182.4.  GENERAL:  A 77 year old female sitting upright in no distress.  HEENT:  Normocephalic,  atraumatic.  NECK:  Palpable bilateral carotid pulses without bruits; no JVD.  LUNGS:  Clear to auscultation all fields.  HEART:  Regular rate and rhythm (S1, S2).  No significant murmurs.  No  rubs or gallops.  ABDOMEN:  Soft, nontender with intact bowel sounds.  EXTREMITIES:  Palpable pulses with no significant edema.  NEUROLOGIC:  No focal deficit.   IMPRESSION:  1. History of paroxysmal atrial fibrillation.      a.     Maintaining normal sinus rhythm.      b.     History of amiodarone intolerance, secondary to nausea.      c.     CHAD2 score:  1.  2. Borderline hypertension.  3. Mixed dyslipidemia/low HDL.  4. Preserved left ventricular function, by 2-D echocardiogram, April      2007.  5. Treated hypothyroidism.  6. Severe osteoarthritis of the knees.   PLAN:  1. Continue current medication regimen, including low-dose aspirin,  indefinitely.  2. I recommended that the patient seek a second opinion with respect      to possible surgery for her knees, secondary to severe arthritis.      I gave her the name of Dr. Ollen Gross of [facility]      [facility].  She seems quite interested in seeking a second opinion      regarding possible surgery.  I pointed out to her, however, that if      she were to opt for surgery, that she would most likely need a      pharmacologic stress test for preoperative clearance.  She is quite      amenable to this plan and will await scheduling of this, pending      her decision as to whether or not to proceed with surgery.  3. Plan on having the patient continue annual follow-up with Dr.      Andee Lineman in 1 year, for continued monitoring of history of paroxysmal      atrial fibrillation.      Gene Serpe, PA-C  Electronically Signed      Learta Codding, MD,FACC  Electronically Signed   GS/MedQ  DD: 05/18/2007  DT: 05/19/2007  Job #: [id number]   cc:   Lia Hopping

## 2010-08-10 NOTE — H&P (Signed)
[name], [name]                 ACCOUNT NO.:  [id number]   MEDICAL RECORD NO.:  [id number]          PATIENT TYPE:  INP   LOCATION:  1532                         FACILITY:  [facility]   PHYSICIAN:  Theodosia Paling, MD    DATE OF BIRTH:  03/21/1931   DATE OF ADMISSION:  04/11/2008  DATE OF DISCHARGE:                              HISTORY & PHYSICAL   Patient is admitted.  He is unassigned.   CHIEF COMPLAINT:  Severe abdominal pain.   HISTORY OF PRESENT ILLNESS:  [name] is a very pleasant 77-year-  old lady with a past medical history of right lower quadrant abdominal  surgeries in the past.  Several years back, she had surgery in the right  lower quadrant.  According to her, she woke up yesterday with severe  abdominal pain, which was more than 10/10 in severity.  It caused her to  bend over.  Also, she had associated intermittent vomiting and nausea.  According to her, for the last 48 hours, she has not eaten anything.  She has been needing IV pain medication.  She went to [facility]  for further evaluation, where she underwent CT scan, which did not show  any acute intra-abdominal or intrapelvic events.  There was mild  diverticulosis but no acute findings were noted.  She received a  transabdominal ultrasound, which was essentially unremarkable.   During the hospitalization, patient was still needing IV pain  medication.  The patient was transferred from [facility] to [facility] for internal medicine evaluation.   PAST MEDICAL HISTORY:  1. History of paroxysmal atrial fibrillation.  2. Borderline hypertension.  3. Dyslipidemia.  4. Treated hypothyroidism.  5. Severe osteoarthritis of the knee.   PAST SURGICAL HISTORY:  1. Bronchogenic cyst, status post right radio-assisted thoracoscopic      surgery and dissection of mediastinal mass.  2. Acute blood loss anemia.  3. Status post laparoscopic repair of incarcerated ventral hernia,      lysis of adhesions,  and cholecystectomy in February, 2007 by Dr.      Daphine Deutscher.  4. History of appendectomy.  5. Bilateral tubal ligation.  6. Bilateral rotator cuff tear repair.  7. Diverticulosis.   DICTATION ENDED AT THIS POINT.      Theodosia Paling, MD  Electronically Signed     NP/MEDQ  D:  04/11/2008  T:  04/11/2008  Job:  [id number]

## 2010-08-10 NOTE — H&P (Signed)
[name] [name], [name] [name]                 ACCOUNT NO.:  [id number]   MEDICAL RECORD NO.:  [id number]          PATIENT TYPE:  INP   LOCATION:  1532                         FACILITY:  [facility]   PHYSICIAN:  Theodosia Paling, MD    DATE OF BIRTH:  06/20/1930   DATE OF ADMISSION:  04/11/2008  DATE OF DISCHARGE:                              HISTORY & PHYSICAL   ADDENDUM:   HOME MEDICATIONS:  1. Synthroid 175 mcg p.o. daily.  2. Alprazolam 0.5 mg p.o. nightly.  3. Nexium 40 mg p.o. daily.  4. Aspirin 81 mg p.o. daily.  5. Hydrocodone 5/500 mg p.o. q.6 h as needed.  6. Detrol LA 4 mg p.o. daily.   ALLERGIES:  MORPHINE causes rash and hot flashes.   REVIEW OF SYSTEMS:  As per in HPI, otherwise negative.   FAMILY HISTORY:  Mom died of old age, her Dad died also died of old age  but had a prior history of stroke.  Denies any history of cancer,  diabetes, hypertension in the family.   SOCIAL HISTORY:  She lives alone in [place].  Never smoked cigarettes  or has history of tobacco or alcohol or IV drug abuse.   EXAMINATION:  Blood pressure 160/74, temperature 98.1, heart rate 68,  respiration 18, oxygen saturation 91% at room air.  GENERAL:  No cardiorespiratory distress.  HEENT:  Pupils equally reactive to light, EOM intact.  LUNGS:  Normal breath sounds, no rhonchi or crepitations.  CARDIOVASCULAR:  S1 and S2 normal, no murmur, gallop or rub heard.  GI:  Soft, nontender, no organomegaly.  EXTREMITIES:  No pedal edema.  CNS:  Motor sensory intact, skin intact, no rashes.   A CT scan of the abdomen and pelvis shows no acute intra-abdominal or  pelvic pathologies.   LAB DATA:  CBC:  WBC 10.4, hemoglobin 12.4, hematocrit 37.1, platelet  count was 236.  Sodium 138, potassium 3.4, chloride 102, bicarbonate 26,  glucose 97, BUN 13, creatinine 0.71.  AST, ALT normal.  Urinalysis  normal.  Transvaginal ultrasound no pelvic pathology.   ASSESSMENT AND PLAN:  1. Abdominal pain.  Patient had  severe abdominal pain, it could be      secondary to diuretic stone that she has passed already or may be      intermittent small bowel obstruction secondary to adhesion which      was not visible in the CT scan.  At this time, patient is      completely abdominal pain-free.  Will continue oral and IV pain      medications as needed.  If needed, x-ray will be repeated.  Patient      specifies that most likely the pain is at her surgical scar site,      at this time it does not appear infected.  Abdominal examination is      benign.  We will continue to follow up symptomatically as an      outpatient.  If patient's condition worsens, general surgery      consult can be carried out.  At this time, there is  no indication      for general surgery to get involved.  2. Hypothyroidism.  Continue Synthroid.  3. Gastroesophageal reflux disease.  Continue proton pump inhibitor.  4. Anxiety.  Continue as needed alprazolam.  5. Prophylaxis.  Deep venous thrombosis and GI on board.  6. Code status.  Patient is Full Code.   TOTAL TIME SPENT ON ADMISSION:  45 minutes.      Theodosia Paling, MD  Electronically Signed     NP/MEDQ  D:  04/11/2008  T:  04/11/2008  Job:  [id number]

## 2010-08-13 NOTE — H&P (Signed)
[name] [name], MAIDEN                 ACCOUNT NO.:  [id number]   MEDICAL RECORD NO.:  [id number]          PATIENT TYPE:  INP   LOCATION:  1831                         FACILITY:  [facility]   PHYSICIAN:  Kerin Perna, M.D.  DATE OF BIRTH:  [birth year]   DATE OF ADMISSION:  07/19/2005  DATE OF DISCHARGE:                                HISTORY & PHYSICAL   CHIEF COMPLAINT:  Severe abdominal right chest and back pain since midnight.   HISTORY OF PRESENT ILLNESS:  [name] [name] is a 77 year old Caucasian female who  is status post a right thoracotomy for bronchogenic cyst on July 12, 2005.  She was discharged home on July 17, 2005.  Currently there is no discharge  summary available but apparently she was started on Metoprolol and digoxin  for intermittent atrial fibrillation.  She reports that at discharge she was  still having some right chest incisional pain and rated this around a 6/10,  and says this was tolerable and she was able to get around without  difficulty.  However, around midnight last night she developed increasing  pain now also involving the abdomen, right chest and extending to the back.  Pain was constant at a number 10/10 and sharp.  She had no relief with  Tylox.  She did get a little relief with Dilaudid in the emergency  department.  She is unable to determine if her abdomen or right chest pain  as worse.  Of note, she did have a laparoscopic repair of incarcerated  ventral incisional hernia, lysis of adhesions and a cholecystectomy by Dr.  Daphine Deutscher on May 25, 2005.  Since then she has been tolerating regular  food and reports a normal bowel movement this morning.  She denies  hematochezia. She had some mild intermittent nausea for the last 24 hours  but no emesis.  She reports she has been a little more short of breath  secondary to her pain.  She has had no productive cough or fever.  This  morning she was examined by the Home Health nurse who called EMS  due to Ms.  [name]'s increasing pain as well as increase her heart rate which was  reported as 140, sinus tachycardia noted during EMS transport but she now  has a heart rate between 60's 70's.  and she is now with a heart rate in 60s  and 70s.   PAST MEDICAL HISTORY/SURGICAL HISTORY:  1.  Status post right VATS and thoracotomy for resection of  bronchogenic      cyst, July 12, 2005 by Dr. Donata Clay.  2.  Status post laparoscopic repair of an incarcerated ventral hernia, lysis      of adhesion and cholecystectomy on May 25, 2005 by Dr. Daphine Deutscher.  3.  Hypothyroidism.  4.  Bilateral rotator cuff repair.  5.  History of appendectomy.  6.  Bilateral tubal ligation.  7.  Degenerative joint disease particularly of the spine.  8.  Diverticulosis.  9.  Osteoarthritis particularly in the knees.  10. Paroxysmal atrial fibrillation during her most recent hospitalization.   ALLERGIES:  MORPHINE -  caused a rash and hot flashes.   MEDICATIONS:  1.  Xanax 0.5 mg p.o. q.h.s.  2.  Synthroid 150 mcg  p.o. daily.  3.  Detrol LA 4 mg p.o. daily.  4.  Oxaprozin 600 mg p.o. daily.  5.  Nexium 40 mg p.o. daily.  6.  Digitek 0.125 mg p.o. daily.  7.  Metoprolol 25 mg p.o. daily.  8.  Multivitamin p.o. daily.  9.  Neurontin 300 mg p.o. b.i.d.   REVIEW OF SYSTEMS:  Please refer to history of present illness for pertinent  positives and negatives.  She denies any dysuria.   FAMILY HISTORY:  Her mom died of old age.  Her dad also died of old age but  had a prior history of stroke.  She denied known history of cancer,  aneurysm, diabetes mellitus or hypertension.   SOCIAL HISTORY:  She lives alone in [place]. She has never smoked and  does not use alcohol.   PHYSICAL EXAMINATION:  VITAL SIGNS:  Blood pressure 160/73, heart rate 84,  temperature 98.0, respirations 18. Oxygen  saturation 98% on room air.  GENERAL APPEARANCE:  She is alert, cooperative and appears in no acute  distress.  HEENT:  Head is  normocephalic, atraumatic. Sclerae anicteric.  Pharynx is  clean, dry, intact without lesions.  NECK:  Supple, no lymphadenopathy noted.  CARDIAC:  Her heart has a regular rate and rhythm.  No murmur, rub or gallop  noted.  LUNGS:  Clear but diminished at the right base, no wheezes, rhonchi or  crackles noted.  ABDOMEN:  Soft, nondistended. She did have some tenderness with guarding in  the right upper quadrant.  She also had some mild tenderness in the left  upper quadrant and right lower quadrant.  She has good bowel sounds.  No  masses were palpated.  Abdomen is rather obese.  Her laparoscopic incision  sites appear to be healing well without signs of infection.  EXTREMITIES:  Show palpable distal pulses x4 without edema.  SKIN:  Her right chest wall incision is clean, dry and intact without signs  of infection.  She has a scant amount of serosanguineous drainage from her  old right chest tube site.   LABORATORY/RADIOLOGY DATA:  Her PA and lateral chest x-ray shows right-sided  pleural thickening, small amount of pleural fluid but no  pneumothorax or  focal air space disease. White blood count 11,200, hemoglobin 10.3,  hematocrit 30.3, platelet count 419. Creatinine 0.7, total bilirubin and  direct bilirubin both slightly increased at 1.40 and 0.8 respectively,  indirect bilirubin is 0.6, alkaline phosphatase 54, AST 48, ALT 12, total  protein 6.4 and blood albumin 2.6.   ASSESSMENT/PLAN:  This is a 77 year old Caucasian female status post recent  right thoracotomy for resection of a bronchogenic cyst and also status post  laparoscopic cholecystectomy with ventral hernia repair and lysis of  adhesions.  She now presents with worsening new onset of pain involving the  abdomen, right chest wall and back.  Currently her pulmonary exam seems  rather benign and pain seems more localized in her abdominal region. Subsequently Dr. Daphine Deutscher who was seeing another patient in the emergency   department asked to see [name] [name]. He felt that her abdominal tenderness and  guarding was rather  superficial and overall expected postoperatively.  He  had no further recommendations from a surgery standpoint but noted he would  be happy to see her later on in this hospitalization if  needed.  Since her  pain is obviously an issue we will order a CT scan of the abdomen and chest.  We will also start her on a  fentanyl  patch and IV Dilaudid in addition to Tylox p.r.n.  Will check  urinalysis and admit her to a  telemetry unit 2000 under the care of Dr.  Kathlee Nations Trigt.      Jerold Coombe, P.A.      Kerin Perna, M.D.  Electronically Signed    AWZ/MEDQ  D:  07/19/2005  T:  07/19/2005  Job:  [id number]

## 2010-08-13 NOTE — Op Note (Signed)
NAMETANETTA, FUHRIMAN                 ACCOUNT NO.:  [id number]   MEDICAL RECORD NO.:  [id number]          PATIENT TYPE:  OIB   LOCATION:  5733                         FACILITY:  [facility]   PHYSICIAN:  Thornton Park. Daphine Deutscher, MD  DATE OF BIRTH:  1931-02-17   DATE OF PROCEDURE:  05/25/2005  DATE OF DISCHARGE:                                 OPERATIVE REPORT   PREOPERATIVE DIAGNOSIS:  Right lower quadrant pain and palpable hernia in  the right lower quadrant and gallstones.   POSTOPERATIVE DIAGNOSIS:  Right lower quadrant pain and palpable hernia in  the right lower quadrant and gallstones.   PROCEDURE:  Laparoscopic takedown of numerous adhesions stuck up in a small  right lower quadrant ventral incisional hernia containing a tongue of fat  incarcerated within the hernia, chronic cholecystitis and cholelithiasis.   SURGEON:  Thornton Park. Daphine Deutscher, MD   ASSISTANT:  Leonie Man, MD   ANESTHESIA:  General endotracheal.   DESCRIPTION OF PROCEDURE:  Ms. Sellinger was taken to room 16 on May 25, 2005 and given general anesthesia.  The abdomen was prepped with  chlorhexidine.  A longitudinal incision was made down in the umbilicus and  through that, I entered the abdomen in an open fashion and insufflated.  I  focused attention first in the right lower quadrant and I had marked her  trigger point.  She had numerous adhesions covering the right lower quadrant  and I got a pair of scissors and I put another 5 in on the right side and  sharply took these down to a very avascular plane.  There was essentially no  bleeding encountered.  When I came to the trigger point, I was found a  hernia defect in the posterior sheath containing a knuckle of fat trapped  between the layers which was getting pinched, presumably the source for her  pain.  I went ahead and removed that and took down all the adhesions.  I  then cut down on the skin down over this hernia.  I used 0 Prolenes and I  used the EndoClose  device to place 2 simple sutures through the fascia full-  thickness.  These were tied down and this completely obliterated the defect.  The wound was then closed with 4-0 Vicryl.   We then went back up and put the remaining portion of our trocars in place.  The gallbladder was grasped and elevated.  I had dissected free Calot's  triangle and got a critical view demonstrating the long cystic duct.  No  cholangiogram was performed.  The cystic duct was triply clipped and  divided.  The cystic artery was double-clipped and the gallbladder was  removed from the gallbladder bed with hook electrocautery.  Bleeding was  controlled throughout and there were no bleeders or bile leaks noted at the  end of the case.  The gallbladder was placed a bag, although there was a  tear from 1 of the grasping points, but it was placed a bag.  No stone  leaked out, but I removed it through the bag without difficulty.  The  gallbladder was brought out through the upper abdomen.  The area was  irrigated.  The umbilical defect was repaired with 2 simple suture of 0  Vicryl under laparoscopic  vision.  The hernia repair was intact.  No bleeding or bile leaks were  noted.  The abdomen was deflated.  The wounds were closed with 4-0 Vicryl,  Benzoin and Steri-Strips.  The patient seemed to tolerate the procedure well  and was taken to the recovery room in satisfactory condition.      Thornton Park Daphine Deutscher, MD  Electronically Signed     MBM/MEDQ  D:  05/25/2005  T:  05/26/2005  Job:  [id number]   cc:   Kerin Perna, M.D.  [address]  [place]  [place] [address]   Lia Hopping  Fax: [phone]

## 2010-08-13 NOTE — Discharge Summary (Signed)
[name] [name], [name] [name]                 ACCOUNT NO.:  [id number]   MEDICAL RECORD NO.:  [id number]          PATIENT TYPE:  INP   LOCATION:  2040                         FACILITY:  [facility]   PHYSICIAN:  Kerin Perna, M.D.  DATE OF BIRTH:  [birth year]   DATE OF ADMISSION:  07/19/2005  DATE OF DISCHARGE:  07/23/2005                                 DISCHARGE SUMMARY   ADMISSION DIAGNOSIS:  A 6-cm mediastinal subchondral mass consistent with  bronchogenic cyst.   DISCHARGE DIAGNOSES:  1.  Bronchogenic cyst, status post right video-assisted thoracoscopic      surgery and resection of mediastinal mass.  2.  Acute blood loss anemia.  3.  Atrial fibrillation.  4.  Hypothyroidism.  5.  Arthritis.  6.  Gastroesophageal reflux disease.  7.  Stress incontinence.   SERVICE:  CVTS.   CONSULTATIONS:  None.   PROCEDURE:  On July 12, 2005, the patient underwent a video-assisted  thoracoscopic surgery, right thoracotomy, and resection of mediastinal mass.  The patient underwent fiberoptic flexible bronchoscopy and placement of  wound on T-catheter system by Dr. Kathlee Nations Trigt.   HISTORY AND PHYSICAL EXAMINATION:  This is a 77 year old obese white female,  nonsmoker, who was found in late 2006 to have a large mediastinal mass with  compression of the right mainstem bronchus and deviation of the esophagus. A  PET scan of the mass showed no evidence of __________ activity or any other  evidence of neoplastic disease. MRI study showed it to be a probable  bronchitic cyst and by barium swallow there was no communication of this  mass in the esophagus. Bronchoscopy previously performed by Dr. Sung Amabile with  transbronchial alveoloplasty showed extrinsic compression of the right  mainstem bronchus and biopsy results consistent with malignant cell.   The patient recently was found to have a entrapped hernia on CT scan and  underwent a laparotomy, takedown of the right lower quadrant adhesions and  ventral hernia, and cholecystectomy on February 28th by Dr. Daphine Deutscher. The  patient recovered from the laparotomy without any pulmonary problems, but  complain of persistent groin pain. The patient requires narcotics. The  patient was recovered from the laparotomy in the telemetry suite, was  ambulating, eating, and had some energy level. The patient presented to Dr.  Donata Clay on July 08, 2005, to discuss elective surgery for resection of  mediastinal mass. The patient had a previous pulmonary function test  performed by Dr. Jetty Duhamel which showed an FEV1 of 2.14, FVC of 2.77,  predicted function. Her maximal ventilatory volume was 78% of predicted. The  patient denied any recent symptoms of an upper respiratory tract infection,  fevers, or other acute medical problems.   HOSPITAL COURSE:  Postoperatively the patient progressed well. She did  experience acute blood loss anemia with a hemoglobin of 10.2 on  postoperative day #1. The patient did not receive packed red blood cells.  Her H&H was monitored closely and did improve appropriately by the time of  discharge. The patient had some leukocytosis postoperatively with no  temperature. A urinalysis and blood  culture were assessed on postoperative  day #1, which were negative. Her leukocytosis had improved by the end of the  patient's hospital stay. The patient is having a small run of atrial  fibrillation on postoperative day #2. She was started on digoxin and a beta  blocker and she had a return into normal sinus rhythm.   The patient was ambulating on postoperative day #1. She was continuing her  incentive spirometry. Her diet was advanced fully on postoperative day #2.   On postoperative day #2 her pathology showed a bronchogenic cyst with lymph  nodes negative.  The patient did not have an air leak per chest tube  placement. The patient's chest tube was discontinued on July 15, 2005. Her  chest x-ray remained stable throughout  her stay.  Throughout the patient's  hospital stay she was stable and progressed as expected.   PHYSICAL EXAMINATION ON DISCHARGE:  VITAL SIGNS: Blood pressure of 145/58,  heart rate 68, respiratory rate 20, O2 saturation 95% on room air, I&O 250,  temperature 97.4.  CARDIOVASCULAR: Regular rate and rhythm.  RESPIRATORY: Clear to auscultation bilaterally.  ABDOMEN: Benign.  EXTREMITIES: No edema.  CHEST: Incision clean, dry, and intact, healing well.   No labs were done at the time of discharge.   DISCHARGE CONDITION:  Stable.   DISPOSITION:  The patient will be discharged to home.   MEDICATIONS:  1.  Tylox one to two q.4h. p.r.n.  2.  Digoxin 0.125 mg p.o. daily.  3.  Xanax 0.5 mg p.o. b.i.d.  4.  Detrol-LA 4 mg p.o. daily.  5.  Levothyroxine 175 mcg p.o. daily.  6.  Nexium 40 mg p.o. daily.  7.  Neurontin 300 mg p.o. daily.  8.  Multivitamin p.o. daily.  9.  Toprol-XL 25 mg p.o. daily.  10. Daypro 600 mg p.o. daily.   INSTRUCTIONS:  The patient is instructed to follow diet as before, low fat,  low salt. She is to shower and to be __________. The patient is to do no  driving or heavy lifting greater than 10 pounds for three weeks. The patient  is to walk with assistance, increase activity slowly and as tolerated. The  patient is instructed to call the office if she has any problems at [phone].   FOLLOWUP:  The patient has a follow-up appointment with Dr. Donata Clay on May  11th at 12:45 p.m. She is to have a chest x-ray taken at [facility] on Aug 05, 2005, at 11:45 a.m.      Constance Holster, [place]      Kerin Perna, M.D.  Electronically Signed    JMW/MEDQ  D:  08/17/2005  T:  08/17/2005  Job:  [id number]

## 2010-08-13 NOTE — H&P (Signed)
NAMEGIORGIA, [name]                 ACCOUNT NO.:  [id number]   MEDICAL RECORD NO.:  [id number]          PATIENT TYPE:  EMS   LOCATION:  MAJO                         FACILITY:  [facility]   PHYSICIAN:  Lonia Blood, M.D.DATE OF BIRTH:  Jul 02, 1930   DATE OF ADMISSION:  03/25/2005  DATE OF DISCHARGE:                                HISTORY & PHYSICAL   CHIEF COMPLAINT:  Severe right lower quadrant abdominal pain x5 days.   HISTORY OF PRESENT ILLNESS:  [name] [name] is a very pleasant 77 year old  female who lives in [place], [place].  She presented to [facility]  emergency room tonight with a complaint of approximately five days of severe  abdominal pain.  With further history, she actually relates that the pain  dates back to November 23, 2004.  On that night, she had the acute onset of  right lower quadrant abdominal pain.  It was crampy in nature and  nonradiating.  It occurred irrespective of po intake.  An increase in  severity after its onset, and she therefore presented to [facility].  She had a CT scan at that time, which revealed a 7 mm gallstone and  diverticulosis.  The patient was treated with antibiotics and discharged  home.  Since that time, she has had intermittent spells, which were mild.  On December 25th, however, she had recurrence of pain that she noticed first  when awakening from sleep.  The pain was again crampy and severe and focused  on the right lower quadrant.  There was associated intermittent nausea.  As  the day progressed, the patient proceeded to vomit 4-5 times.  Her po intake  became limited; however, she was able to eat and drink.  She has had very  little appetite.  Bowel movements were regular.  There was no hematemesis.  Symptoms worsened through the night.  On March 22, 2005, the patient  presented to [facility] again for re-evaluation.  At that time, she  apparently had a CT scan of the abdomen, although we have not  been  successful in obtaining those records.  She was told her CT scan was  normal and that she had a urinary tract infection.  She was placed on  Cipro and sent home.  Since that time, symptoms have continued without  significant change.  She reports a baseline, crampy right lower quadrant  abdominal pain with intermittent, acutely severe spikes.  She has continued  to move her bowels with two bowel movements in the last 48 hours.  These  were regular in quality.  Again, there was no hematochezia and no melena.  Throughout the day, however, she has had paroxysms of pain in the right  lower quadrant, described as severe crampy pain, which was unrelenting.  As  a result, she presented to [facility] for a second opinion tonight.   At present, she is lying comfortably on a hospital stretcher in room 23 at  [facility] ER.  I have been asked to evaluate the patient as the emergency  room physician felt that she had  gallstone cholecystitis and needed medical  stabilization prior to determination of surgical intervention.   REVIEW OF SYSTEMS:  Patient is racked with chronic severe osteoarthritis  primarily affecting the shoulders, knees, and back.  Otherwise, she is in  remarkable health and the only positives on comprehensive review of systems  are those noted in the history of present illness listed above.   PAST MEDICAL HISTORY:  1.  Hypothyroidism.  2.  Right and left rotator cuff surgeries with the most recent being in      March, 2002.  3.  Status post appendectomy.  4.  Status post bilateral tubal ligations.  5.  Severe degenerative disk disease of the thoracolumbar spine noted on CT      scan in August, 2006.  6.  Diverticulosis coli, noted via CT scan on November 22, 2004.  7.  Severe diffuse osteoarthritis.   OUTPATIENT MEDICATIONS:  1.  Xanax 0.5 mg p.o. q.h.s.  2.  Synthroid 150 mcg p.o. daily.  3.  Detrol LA 4 mg p.o. daily.  4.  Oxaprozin 600 mg p.o. q.12h.  5.  Nexium 40  mg p.o. daily.  6.  Lortab q.12h. p.r.n.  7.  Cipro 500 mg p.o. b.i.d. since March 22, 2005.  8.  Phenergan 25 mg suppositories p.r.n. since March 22, 2005.   ALLERGIES:  No known drug allergies.   FAMILY HISTORY:  Noncontributory secondary to age.   SOCIAL HISTORY:  [place]  She lives  alone.  She does not smoke.  She does not drink.  She has children who are  active in her life.   DATA REVIEWED:  White count is 9.2, hemoglobin 12.3, MCV 93, platelets 282.  Urinalysis is negative.  Lipase is normal.  Electrolytes are balanced.  BUN  is 7, creatinine 0.9.  Serum glucose is normal.  Calcium is normal.  LFTs  are normal.  Total bili 0.6, albumin 3.7.   Ultrasound of the abdomen reveals cholelithiasis without wall thickening or  pericystic coli fluid.   PHYSICAL EXAMINATION:  VITAL SIGNS:  Temperature 96.9, blood pressure  148/73, heart rate 64, respiratory rate 20, O2 sat 96% on room air.  GENERAL:  A well-developed and well-nourished female in no acute respiratory  distress, lying comfortably in the hospital stretcher.  HEENT:  Normocephalic and atraumatic.  Pupils are equal, round and reactive  to light and accommodation.  Extraocular muscles are intact bilaterally.  OC/OP clear.  NECK:  No JVD.  No lymphadenopathy.  No thyromegaly.  LUNGS:  Clear to auscultation bilaterally without wheezes or rhonchi.  CARDIOVASCULAR:  Regular rate and rhythm without murmur, rub or gallop.  Normal S1 and S2.  ABDOMEN:  Mildly obese, slightly distended.  Bowel sounds are positive.  There is no appreciable mass.  There is no rebound tenderness.  There is  very mild tenderness diffusely across the abdomen.  There is no Murphy's  sign.  There is pain primarily in the right lower quadrant.  The patient has  an approximately 4 inch diagonal right lower quadrant scar from multiple years past.  It is well healed.  On deep palpation within the scar, I do   feel what is either scar tissue or possibly bowel.  When I palpate this  structure, the patient reports that it reproduces her pain, and it is clear  that the pain is exquisite during my inspection.  There is no change with  Valsalva/cough.  EXTREMITIES:  No clubbing, cyanosis  or edema in the bilateral lower  extremities.  NEUROLOGIC:  Cranial nerves II-XII are intact bilaterally.  Strength 5/5  bilaterally in the upper and lower extremities.  Intact sensation and touch  throughout.  No Babinski's.  Alert and oriented x4.   IMPRESSION/PLAN:  1.  Severe right lower quadrant abdominal pain:  The two primary issues on      my differential at this point include diverticulitis versus possible      incisional hernia.  Worst case scenario would be an incarcerated hernia.      There is no bulging mass at this time, and the patient is not violently      ill, so I think an incarcerated hernia is less likely, but I do feel      that it needs to be ruled out.  Despite the fact that the patient had a      CT scan of the abdomen just three days ago, incarcerated hernia      certainly could have occurred since that time; therefore, I feel a      repeat CT scan is required.  As the CT scan of the abdomen will make a      diagnosis of diverticulitis clear and therefore give [place] clear indication      for antibiotic therapy.  Pain control will be administered using      Dilaudid IV.  Supportive IV fluids will be administered, and the patient      will be kept n.p.o. until it is clear as to whether or not she needs      surgical intervention.  I will check coags in case the patient does      require operative intervention.  2.  Hyperthyroidism:  We will continue the patient's Synthroid at 150 mcg      p.o. daily.  I will continue this per the oral route, unless it becomes      clear that the patient will need surgery, then we will cut her dose to      half and administer it via IV.  3.  Severe degenerative  disk disease:  The patient's pain is usually      controlled with nonsteroidals.  In the setting of inpatient      hospitalization and possible surgery, I wish to withhold high dose      nonsteroidals.  We will use Dilaudid for general pain control but with      attempt to resume NSAIDs if the patient proves not to require surgical      intervention.  4.  Cholelithiasis:  Patient clearly does have cholelithiasis.  She does not      clinically appear to have cholecystitis.  CT scan of the abdomen will      help delineate whether the patient has developed an acute cholecystitis      since the scan results that we have been able to obtain from [facility] in      August, which revealed no evidence of cholecystitis.  5.  Diverticulosis:  Patient has confirmed diverticulosis via CT scan in      August, 2006.  This significantly raises a concern of acute      diverticulitis.  CT scan of the abdomen is being accomplished to rule     out acute diverticulitis.      Lonia Blood, M.D.  Electronically Signed     JTM/MEDQ  D:  03/25/2005  T:  03/25/2005  Job:  [id number]

## 2010-08-13 NOTE — Op Note (Signed)
[facility]  Patient:    [name] [name], [name] [name]                       MRN: [id number] Proc. Date: 05/30/00 Attending:  Fayrene Fearing P. Aplington, M.D.                           Operative Report  PREOPERATIVE DIAGNOSIS:  Torn rotator cuff, right shoulder.  POSTOPERATIVE DIAGNOSIS:  Torn rotator cuff, right shoulder.  PROCEDURE:  Anterior acromionectomy and repair of torn rotator cuff, right shoulder.  SURGEON:  Illene Labrador. Aplington, M.D.  ASSISTANT:  Ralene Bathe, P.A.  ANESTHESIA:  General.  PATHOLOGY AND INDICATION FOR PROCEDURE:  She fell on December 16 of last year, injuring her right wrist and her right shoulder.  Because of persistent right shoulder pain, she saw me on January 18, at which time I set her up for an MRI of the right shoulder, which was performed on February 4 and demonstrated an intrasubstance tear of the long head of the biceps tendon as well as a complete full-thickness tear through the rotator cuff about a centimeter from its attachment with some retraction of underlying fibers.  See operative description for additional details of pathology.  She also had some tenderness at the [facility] joint with only slight narrowing on plain films and no real abnormalities on the MRI, and since she preferred not to have any distal clavicle excision unless absolutely necessary, I did not excise it.  It appears that her area of pathology was close to this location and could very well have been the cause for some of her symptoms at this location.  DESCRIPTION OF PROCEDURE:  Prophylactic antibiotics.  Satisfactory general endotracheal anesthesia following interscalene block by anesthesiologist. Schlein frame.  Beach chair position.  The right shoulder girdle was prepped with Duraprep, draped in a sterile field, Ioban employed.  Vertical incision from left of the [facility] joint down to the greater tuberosity.  The fascia over the anterior acromion was incised in  line with the skin incision and reflected anteriorly and posteriorly.  The [facility] joint was identified with a Mellody Dance needle and tentative line for the initial anterior acromionectomy marked out with a Bovie.  I then undermined the anterior acromion and joint fluid came forth, confirming that we did have a full-thickness tear.  Protecting the underlying rotator cuff, I then made my initial anterior acromionectomy with a micro-saw, removing the bone fragment of the coracoacromial ligament.  I then performed additional decompression with saw and a small rongeur until I was satisfied that we had a wide subacromial space.  Then inspected the rotator cuff area. A subtotal bursectomy was performed.  She had an indented area with a thin layer of rotator cuff on top of it, which corresponded to the location of the tear on the MRI.  I saw no other evidence for a tear externally.  I then opened this in line with the rotator cuff, that is, vertically, with a 15 knife blade and confirmed that this was just a thin onion-skin topping with most of the rotator cuff beneath and retracted a good centimeter.  I was not able to immediately see the long head of the biceps tendon, which I wanted to search for because of the MRI picture of intrasubstance tear.  Accordingly, I opened the vertical portion of the rotator cuff in hockey-stick fashion, continuing it anteriorly, leaving a nice flap  of rotator cuff so that I could re-suture it.  I finally located what was left of the biceps tendon with a fragment proximally, which had completely ruptured from the remainder of the long head of the biceps tendon.  I removed the proximal remnant so as to minimize any mechanical obstruction problems.  I then created a bony trough with quarter-inch osteotome and rongeur and made three lateral drill holes through the greater tuberosity.  I then used two horizontal mattress sutures, securing both the superficial and the retracted  portions of the rotator cuff with them.  I then supplemented these sutures with multiple #1 Ethibond, both in the vertical portion of the hockey stick, placing the tissue side-to-side, as well as reapproximating the anterior horizontal portion of the hockey stick, reconstituting what I had cut there.  With the arm slightly abducted, I then tied these sutures through the drill holes and then oversewed the distal end of the rotator cuff to the lateral soft tissues with multiple #1 Vicryl sutures.  This gave a nice, stable repair.  I checked for impingement, and there was none.  The wound was then irrigated with sterile saline, and soft tissue was infiltrated with 0.5% Marcaine with adrenalin.  Then closed the deltoid in two layers with #1 Vicryl and fascia over the anterior acromion with a nice, tight closure, since she had good, healthy tissue on top of the acromion, with interrupted #1 Vicryl as well.  Subcutaneous tissue was closed with a combination of 2-0 Vicryl deep, 4-0 Vicryl subcuticular, with Steri-Strips on the skin. Dry sterile dressing and shoulder immobilizer were applied.  She tolerated the procedure well and was taken to the recovery room in satisfactory condition with no known complications. DD:  05/30/00 TD:  05/31/00 Job: [id number]

## 2010-08-13 NOTE — Op Note (Signed)
NAMEZAKHIA, SERES                 ACCOUNT NO.:  [id number]   MEDICAL RECORD NO.:  [id number]          PATIENT TYPE:  INP   LOCATION:  3307                         FACILITY:  [facility]   PHYSICIAN:  Kerin Perna, M.D.  DATE OF BIRTH:  December 14, 1930   DATE OF PROCEDURE:  07/12/2005  DATE OF DISCHARGE:                                 OPERATIVE REPORT   OPERATION:  1.  Right video assisted thoracoscopic surgery, right thoracotomy and      resection of mediastinal mass.  2.  Fiberoptic flexible bronchoscopy.  3.  Placement of wound On-Q catheter system.   PREOPERATIVE DIAGNOSIS:  Large mediastinal bronchogenic cyst.   POSTOPERATIVE DIAGNOSIS:  Large mediastinal bronchogenic cyst.   SURGEON:  Kerin Perna, M.D.   ASSISTANT:  Coral Ceo, P.A.-C.   ANESTHESIA:  General by Dr. Guadalupe Maple   INDICATIONS:  The patient is a 77 year old female who was found to have a  large subcarinal mediastinal mass on CT scan when she was being evaluated  for abdominal pain.  She subsequently had bronchoscopy and transbronchial  biopsy of this which showed only mesothelial cells, no evidence of  malignancy.  CAT scan was negative for evidence of malignancy.  An MRI scan  was performed and it was felt she had a probable bronchogenic cyst.  A  barium swallow study showed no communication with the esophagus and  bronchoscopy by her pulmonologist showed no evidence of communication with  the bronchial tree but there was extrinsic compression of the right main  stem bronchus.   After she underwent laparotomy and repair of an incarcerated incisional  hernia and recovered, she was felt be a candidate for resection of this  large mediastinal mass.  I examined the patient in the office on several  occasions and reviewed the results of the x-ray studies with her and her  family.  I discussed indications and expected benefits of right thoracotomy  and resection of this mass.  I reviewed the alternatives  to surgical  therapy, as well.  I reviewed with the patient the risks to her with right  thoracotomy and resection of this mass including the risks of MI, bleeding,  blood transfusion requirement, pneumothorax, pneumonia, esophageal leak,  chylothorax, and death.  She understood these implications for the surgery  and agreed to proceed with operation as planned under what I felt was an  informed consent.   OPERATIVE FINDINGS:  The patient had a large mid mediastinal mass contained  within the visceral pleura.  It was consistent with a bronchogenic cyst.   PROCEDURE:  The patient was brought to operating room and placed supine on  the operating table where general anesthesia was induced under invasive  hemodynamic monitoring.  The patient was intubated with a double-lumen  endotracheal tube which was positioned and confirmed by bronchoscopy by the  anesthesiologist.  The patient was then turned to expose the right chest  which was prepped and draped as a sterile field.  First, a right VATS portal  incision was made in the posterior axillary line and a 30  degrees angled  scope was inserted in the right hemithorax.  The lung was without evidence  of lesions on its surface.  The pleura was intact and there was no evidence  of nodules on the pleura.  The scope was then withdrawn and a small  posterior thoracotomy was made and the chest was entered in the fifth  interspace.  The fifth rib was divided posteriorly and a thoracotomy  retractor was gently inserted.  The thorax was examined.  The upper lobe,  middle lobe and lower lobes were found be free of nodules or tumor.  There  was a soft tissue mass extending from the azygos vein to the inferior  pulmonary ligament which was within the mediastinal pleura.  The mediastinal  pleura was incised around the margins of this mass and the mass was slowly  and carefully dissected from the surrounding adherent structures which  included the  pericardium, esophagus, right main stem bronchus, and inferior  pulmonary vein.  There is no separate blood supply or systemic vessel which  communicated with this structure.  Care was taken to avoid injury to the  esophagus especially during the soft tissue dissection of this bronchogenic  cyst.  Several mediastinal lymph nodes were also removed and although they  appeared to be inflammatory, these were submitted as a separate specimen.   The mass was then removed and opened and had a thick paste like mud  consistency contents.  This was sent for culture.  The mass sent for  permanent section.   The space that the mass occupied was carefully irrigated.  Hemostasis was  obtained.  Two pleural chest tubes were placed posteriorly and anteriorly in  the right hemithorax and brought out through separate incisions.  The  thoracotomy incision was then closed after it was apparent that the mass had  been completely removed and hemostasis was adequate.  Four pericostal  sutures of #2 Vicryl were then placed to reapproximate the ribs.  The muscle  was closed interrupted #1 Vicryl.  The subcutaneous and skin layers were  closed using running Vicryl.  An On-Q catheter was placed between the chest  tubes parallel to the thoracotomy incision and brought out through a  separate incision.  This was filled with the Marcaine from the reservoir for  local pain control.   After the patient was turned supine, the double-lumen tube was exchanged  with a single-lumen tube.  Through this, a fiberoptic bronchoscope was  placed.  The carina was sharp and normal.  The left main stem bronchus had  some mild superficial erythema from the double-lumen tube cuff.  The right  main stem bronchus was examined and there was no mucosal injury and it was  entirely intact without deformity.  The bronchoscope was then removed and the patient was extubated and returned recovery room.  Total blood loss was  minimal.       Kerin Perna, M.D.  Electronically Signed     PV/MEDQ  D:  07/12/2005  T:  07/12/2005  Job:  [id number]   cc:   Joni Fears D. Maple Hudson, M.D.  [facility]  [address]  [place]  [place] [address]

## 2010-08-13 NOTE — Consult Note (Signed)
[name] [name], BUECHNER                 ACCOUNT NO.:  [id number]   MEDICAL RECORD NO.:  [id number]          PATIENT TYPE:  INP   LOCATION:  2040                         FACILITY:  [facility]   PHYSICIAN:  Jonelle Sidle, M.D. LHCDATE OF BIRTH:  1931/02/12   DATE OF CONSULTATION:  DATE OF DISCHARGE:                                   CONSULTATION   REQUESTING PHYSICIAN:  Dr. Kathlee Nations Trigt.   PRIMARY CARE PHYSICIAN:  Dr. Lia Hopping in [place], [place].   REASON FOR CONSULTATION:  Paroxysmal atrial fibrillation.   HISTORY OF PRESENT ILLNESS:  [name] [name] is a 77 year old woman with available  history indicating hypothyroidism, on Synthroid, and recent surgical  procedures including laparoscopic repair of an incarcerated ventral hernia  with lysis of adhesions and cholecystectomy in February of this year,  followed by a right thoracotomy for a bronchogenic cyst on July 12, 2005.  She was recently discharged home from the hospital and was apparently noted  to have some episodes of paroxysmal atrial fibrillation during her prior  hospital stay.  She was managed with beta blocker therapy and digoxin at  that time by her surgical team.  She is now readmitted to the hospital with  discomfort in her abdomen and right chest wall in the area of her prior  incision.  She is being managed for pain control and has been noted on  telemetry to have some episodes of paroxysmal atrial fibrillation.  She may  have also had an episode at home prior to arrival of emergency medical  services when she was transported here initially on the 24th.  She does  state that she at times feels a sense of rapid palpitations, but has had no  obvious chest heaviness or dyspnea with her atrial fibrillation.  She was  placed on amiodarone and Toprol-XL recently and at present is in normal  sinus rhythm with frequent premature atrial complexes.  Of note, she did  have a recent echocardiogram during her recent hospital  stay indicating a  left ventricular ejection fraction of 65% with normal left atrial size and  no major valvular abnormalities or pericardial effusion.  Her  electrocardiogram from July 19, 2005 showed sinus rhythm with nonspecific T  wave changes.   ALLERGIES:  MORPHINE.   PRESENT MEDICATIONS:  1.  Xanax 0.5 mg p.o. nightly.  2.  Synthroid 150 mcg p.o. daily.  3.  Detrol LA 4 mg p.o. daily.  4.  Daypro 600 mg p.o. daily.  5.  Protonix 80 mg p.o. daily.  6.  Toprol-XL 25 mg p.o. daily.  7.  Multivitamin one p.o. daily.  8.  Neurontin 300 mg p.o. b.i.d.  9.  Colace 100 mg p.o. b.i.d.  10. Amiodarone 400 mg p.o. b.i.d.  11. Duragesic patch.  12. Lovenox 40 mg subcu nightly.  13. Cefepime 1 g IV q.12 h.   COMMENT:  Past medical history, social history and family history were all  recently reviewed.  The recent history and physical was reviewed and placed  in the chart.  There has been no major  interval change since her hospital  stay.  She resides in [place] and lives alone.  There is no tobacco use  history.  She notes no prior history of coronary artery disease or  myocardial infarction and is not aware of any previous history of atrial  fibrillation until her recent hospital stays.  She denies any history of  stroke, congestive heart failure or type 2 diabetes mellitus.  She reports a  longstanding history of hypothyroidism.   REVIEW OF SYSTEMS:  Review of systems as described in history of present  illness.  She denies any problems with bleeding diathesis or frequent falls.   PHYSICAL EXAMINATION:  VITAL SIGNS:  Telemetry at present reveals normal  sinus rhythm with frequent premature atrial complexes.  Temperature 99.1  degrees, heart rate 66, respirations 20, blood pressure 122/63.  Oxygen  saturation is 91% on room air.  GENERAL:  This is a normally nourished-appearing woman in no acute distress  complaining of mild incisional discomfort in the right thoracic area.   NECK:  Examination of the neck reveals no elevated jugular venous pressure  or loud carotid bruits.  LUNGS:  Generally clear, although with decreased breath sounds on the right  without obvious wheezing or rales.  The patient is somewhat uncomfortable on  taking a deep breath.  CARDIAC:  Exam reveals a regular rate and rhythm with occasional ectopic  beat, no S3 gallop or pericardial rub is evident and there is no loud  murmur.  ABDOMEN:  Soft with some mild tenderness to palpation, but no frank  guarding.  Bowel sounds are present.  There is no obvious hepatomegaly  noted.  EXTREMITIES:  Extremities exhibit 2+ pulses with no significant edema.  SKIN:  No ulcerative changes are noted.  The patient's thoracic incision and  prior chest tube site are healing without obvious infection (no erythema or  exudates).  NEUROPSYCHIATRIC:  The patient is alert and oriented x3.   LABORATORY DATA:  WBC is 10.0, hemoglobin 8.3, platelets 401,000.  Sodium  136, potassium 3.9, glucose 92, BUN 4, creatinine 0.8.  AST 20, ALT 15,  amylase 40, lipase 18.  TSH 0.937.   RADIOLOGIC FINDINGS:  Chest x-ray from the 26th reveals decreased right  pleural effusion with atelectasis.  There is also pleural thickening on the  right.   IMPRESSION:  1.  Paroxysmal atrial fibrillation noted, status post right thoracotomy      secondary to bronchogenic cyst resection on July 12, 2005.  The patient      is not aware of any prior history of atrial fibrillation.  2.  Hypothyroidism, on Synthroid, with normal recent TSH.  3.  Status post recent laparoscopic repair of an incarcerated ventral hernia      with lysis of adhesions and cholecystectomy in February of this year.   RECOMMENDATIONS:  1.  Agree with beta blocker therapy and at this point, amiodarone.  I would      likely decrease amiodarone dose to 200 mg p.o. b.i.d. for a slow load     over 2 weeks.  I am not entirely certain whether she will need this long-       term, as her rhythm issues may stabilize the further away she is from      her recent thoracotomy.  2.  In terms of anticoagulation, Coumadin would be reasonable, although I am      a bit concerned about starting this now until we are certain she is not  going to need any additional invasive procedures in the near future.  We      will place her on a full-dose aspirin for the time-being.  I did discuss      Coumadin with her today and she at least      initially was somewhat hesitant to consider it anyway.  We will plan to      follow her in the office and can addressed this with her again.  3.  We will follow telemetry with you.           ______________________________  Jonelle Sidle, M.D. [facility]     SGM/MEDQ  D:  07/22/2005  T:  07/22/2005  Job:  [id number]   cc:   Kerin Perna, M.D.  [address]  [place]  [place] [address]   Lia Hopping  Fax: [phone]

## 2010-08-13 NOTE — Op Note (Signed)
NAMEJAHNIA, HEWES                 ACCOUNT NO.:  [id number]   MEDICAL RECORD NO.:  [id number]          PATIENT TYPE:  INP   LOCATION:  6705                         FACILITY:  [facility]   PHYSICIAN:  Clinton D. Maple Hudson, M.D. DATE OF BIRTH:  January 28, 1931   DATE OF PROCEDURE:  03/28/2005  DATE OF DISCHARGE:                                 OPERATIVE REPORT   PROCEDURE:  Bronchoscopy.   INDICATIONS FOR PROCEDURE:  This is a 77 year old white female never smoker  admitted originally for abdominal pain and being evaluated now for an  incidentally discovered subcarinal mass.  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  History and physical was  reviewed and the procedure was discussed with the patient who has given  consent.   DESCRIPTION OF PROCEDURE:  After fully informed consent, bronchoscopy was  done on an inpatient basis in the endoscopy suite.  Oxygen was provided at 8  liters per minute by nasal prongs, holding saturations over 95%.  Cardiac  monitor showed normal sinus rhythm.  The upper airway was anesthetized  topically with 1% Xylocaine.  A cumulative dose of 4 mg of intravenous  Versed was required for additional sedation and cough control.  A video  bronchoscope was advanced via the right nostril to the level of the cords  without difficulty.  Cough reflex was modest.  The trachea was unremarkable  and the cords moved normally.  The main carina was somewhat widened.  Mucosa  was normal and secretions were clear and mucoid.  Sequential examination  bilaterally to the cord division level revealed no endobronchial lesions or  obvious mucosal abnormality.  There was extrinsic compression from the  medial side, particularly for the right lower lobe bronchus without  obstruction.  Rushing and saline lavage were performed in the right lower  lobe.  Wang needle aspiration using a 21-gauge needle was performed  bilaterally on the medial wall of the  bronchus intermedius, one at 3 cm  distal to the carina and in the left bronchus on the medial wall 2 cm distal  to the carina.  There was no evident bleeding or complication. Fluoroscopic  guidance was available.  Patient is being held until stable and then will be  returned to her hospital floor to resume preoperative orders.  Specimens  have done for cytopathology and chest x-ray is pending.      Clinton D. Maple Hudson, M.D.  Electronically Signed     CDY/MEDQ  D:  03/28/2005  T:  03/28/2005  Job:  [id number]   cc:   Lonia Blood, M.D.  Fax: [phone]

## 2010-08-13 NOTE — Discharge Summary (Signed)
[name] [name], [name] [name]                 ACCOUNT NO.:  [id number]   MEDICAL RECORD NO.:  [id number]          PATIENT TYPE:  INP   LOCATION:  6705                         FACILITY:  [facility]   PHYSICIAN:  Isidor Holts, M.D.  DATE OF BIRTH:  1931/03/17   DATE OF ADMISSION:  03/25/2005  DATE OF DISCHARGE:  03/29/2005                                 DISCHARGE SUMMARY   PRIMARY CARE PHYSICIAN:  Ellison Carwin, [place], [place]   DISCHARGE DIAGNOSES:  1.  Nonspecific right upper quadrant abdominal pain.  Negative workup,      including abdominal/pelvic computed tomography scan/abdominal ultrasound      scan/HIDA scan.  2.  Thoracolumbar degenerative joint disease with radiculopathy/chronic back      pain.  3.  Radiculopathic pain lower abdomen secondary to #2 above.  4.  Asymptomatic cholelithiasis.  5.  Hypothyroidism.  6.  Right medial lung mass; status post bronchoscopy March 29, 2005.   DISCHARGE MEDICATIONS:  1.  Synthroid 175 mcg p.o. daily (Was on 150 mcg p.o. daily).  2.  Daypro 600 mg p.o. daily.  3.  Nexium 40 mg p.o. daily.  4.  Detrol LA 4 mg p.o. daily.  5.  Xanax 0.5 mg p.o. q.h.s.  6.  Multivitamin 1 p.o. daily.  7.  Vitamin C 1 p.o. daily.  8.  Vicodin (5/500) 1 p.o. t.i.d. p.r.n., a total of #21 pills have been      dispensed.   PROCEDURES:  1.  Abdominal ultrasound scan dated March 25, 2005, that showed      cholelithiasis, although there is no wall thickening or pericholecystic      fluid.  Patient was, however, tender over the gallbladder.  2.  Abdominal/pelvic CT scan dated March 18, 2005.  This showed mildly      enlarged lymph nodes just above the pancreas, cholelithiasis, lumbar      spondylosis, as well as a possible mediastinal mass posterior to the      heart.  Chest CT scan was recommended.  3.  Chest x-ray dated March 26, 2005.  This showed cardiomegaly,      suboptimal inspiration causing mild bibasilar atelectasis, ectatic      ascending  thoracic aorta is suspected.  4.  Chest CT scan dated March 26, 2005.  This showed large centrally      located chest mass in the subcarinal region.  No distinct lung nodules.  5.  HIDA scan dated March 27, 2005.  This was a normal hepatobiliary      study.  Gallbladder filling is noted.  Imaging features are not      consistent with acute cholecystitis.  6.  Bronchoscopy dated March 28, 2005 performed by Dr. Jetty Duhamel,      [facility].  There was extrinsic compression from medial side,      of right lower bronchus, without obstruction.  Bronchoalveolar lavage      was done, as well as needle biopsy.  7.  Chest x-ray dated March 29, 2005 showed no pneumothorax post      bronchoscopy, stable  cardiomegaly, right medial lung mass.   CONSULTATIONS:  1.  Dr. Delford Field, [facility].  2.  Dr. Jetty Duhamel, [facility].   ADMISSION HISTORY:  As in H&P notes of March 25, 2005, dictated by Dr.  Jetty Duhamel. However, in brief, this is a 78 year old female, with known  history of hypothyroidism, previous appendectomy, severe degenerative disk  disease of the thoracolumbar spine, diverticulosis, severe diffuse  osteoarthritis, who presents with right lower abdominal pain, of  approximately five days' duration.  On detailed questioning, it appears that  symptoms actually started November 19, 2004, with acute onset right lower  quadrant abdominal pain at which time patient presented to [facility].  CT scan at that time revealed a 7 mm gallstone and  diverticulosis.  She was managed with antibiotics and discharged on March 21, 2005.  However, she had recurrence of pain associated with nausea and  vomiting.  She was admitted for further evaluation, investigation and  management.   HOSPITAL COURSE:  Problem 1. Right lower quadrant abdominal pain.  For  details of presentation, refer to above mentioned admission history.  The  initial suspicion was of  possible diverticulitis, given patient's known  history of diverticulosis.  However, abdominal CT scan dated March 26, 2005 failed to reveal evidence of acute diverticulitis.  In addition,  physical examination revealed some right upper quadrant abdominal  discomfort/tenderness.  The patient was managed with analgesics and  antiemetics and a variety of imaging investigations were carried out which  included abdominal ultrasound scan, as well as HIDA scan.  All of these  tests confirmed cholelithiasis, however, failed to reveal any evidence of  acute cholecystitis.  The patient remained afebrile.  White cell count was  normal.  No further episodes of vomiting were recorded after one episode on  March 27, 2005.  The patient was able to maintain oral intake which was  subsequently advanced to regular diet without any further symptomatology.  She was continued on proton-pump inhibitor treatment.   Problem 2.  Thoracolumbar degenerative joint disease with radiculopathy.  As  previously mentioned, the patient presented with lower quadrant abdominal  pain which patient on detailed questioning, insisted, radiated to the back.  She had thoracolumbar spinal tenderness and some pain on lateral rotation of  torso.  It is likely that patient has radiculopathy, secondary to severe DJD  thoracolumbar spine and that the abdominal pain is referred.  Certainly, she  had no guarding or rebound tenderness.  Bowel sounds were normal and the  abdominal imaging was unremarkable for intra-abdominal disease.  The patient  has been reassured accordingly.  As it turned out, she was managed with  analgesics with good clinical response.   Problem 3. Medial right lung mass.  On abdominal CT scan dated March 18, 2005, an incidental finding was of a middle mediastinal mass posterior to the heart.  Chest CT scan was recommended.  This was carried out on March 26, 2005, and confirmed a medial lung mass  which obviously raised the need  for further investigation.  Pulmonary consultation was called, which was  kindly provided by Dr. Delford Field, [facility].  Subsequently on March 28, 2005, Dr. Jetty Duhamel performed a bronchoscopy with bronchoalveolar  lavage and biopsy.  Apparently in the right main bronchus, there was noted  to be extrinsic compression.  Pathology findings were still being awaited,  at the time of this dictation, however, patient is scheduled for for  pulmonary followup  appointment on April 01, 2005 at 4:10 p.m.   Problem 4. Hypothyroidism.  Patient's TSH was found to be markedly elevated  at 25.223, on March 25, 2005.  Her pre-admission Synthroid dosage was 150  mcg daily.  This has been increased to 175 mcg daily.  However, this will  need to be followed up subsequently with repeat TSH in about two weeks, by  patient's primary MD.   DISPOSITION:  Patient was deemed clinically stable for discharge on March 29, 2005.   DIET:  No restrictions.   ACTIVITY:  As tolerated.   WOUND CARE:  Not applicable.   PAIN MANAGEMENT:  Refer to discharge medication list.   FOLLOWUP INSTRUCTIONS:  Patient is instructed to return to her PMD, Dr. Ellison Carwin, [place], [place] in one to two weeks.  She has been instructed  to call for an appointment.  She is also to follow up with Dr. Jetty Duhamel, [facility], telephone number [phone] on April 01, 2005 at  4:10 p.m.  Primary MD is to check TSH in two weeks' time.  All of this has  been communicated to patient.  She has verbalized understanding.      Isidor Holts, M.D.  Electronically Signed     CO/MEDQ  D:  03/29/2005  T:  03/29/2005  Job:  [id number]   cc:   Lia Hopping  Fax: [phone]   Clinton D. Maple Hudson, M.D.  [facility]  [address]  [place]  [place] [address]   Shan Levans, M.D. [facility]  [address]  [place]  [place] [address]

## 2010-08-13 NOTE — Discharge Summary (Signed)
NAMELACHRISHA, ZIEBARTH                 ACCOUNT NO.:  [id number]   MEDICAL RECORD NO.:  [id number]          PATIENT TYPE:  INP   LOCATION:  2040                         FACILITY:  [facility]   PHYSICIAN:  Kerin Perna, M.D.  DATE OF BIRTH:  [birth year]   DATE OF ADMISSION:  07/19/2005  DATE OF DISCHARGE:  07/23/2005                                 DISCHARGE SUMMARY   ADMISSION DIAGNOSIS:  Abdominal and right chest pain following recent  thoracotomy for bronchogenic cyst.   DISCHARGE/SECONDARY DIAGNOSES:  1.  Abdominal and right chest pain following recent thoracotomy for      bronchogenic cyst.  2.  Paroxysmal atrial fibrillation, currently in sinus rhythm on amiodarone      and metoprolol.  3.  Mild hypokalemia, supplemented.  4.  Anemia.  5.  Hypothyroidism.  6.  Bilateral rotator cuff repair.  7.  History of appendectomy.  8.  Bilateral tubal ligation.  9.  Degenerative joint disease, particular of the spine.  10. Diverticulosis.  11. Osteoarthritis, particularly in the knees.  12. Status post video-assisted thoracoscopy and thoracotomy for resection of      bronchogenic cyst July 12, 2005, by Dr. Donata Clay.  13. Status post laparoscopic repair of an incarcerated ventral hernia, lysis      of adhesions and cholecystectomy on May 25, 2005 by Dr. Luretha Murphy.   ALLERGIES:  MORPHINE which causes a rash and hot flashes.   HISTORY:  Ms. Calamari is a 77 year old Caucasian female who is status post a  right thoracotomy for bronchogenic cyst on July 12, 2005 by Dr. Donata Clay.  She was discharged home on April 22.  Her postoperative course was  complicated by paroxysmal atrial fibrillation which was initially treated  with metoprolol and digoxin.  She was discharged with home health nursing  arrangements.  She reports at discharge she was still having some right  incisional pain which she rated around a 6/10 but said this was tolerable,  and she was able to get around  without difficulty.  However, around midnight  on the evening of April 23, she developed increasing pain, now involving the  abdomen, right chest, and extending to the back.  She described the pain as  sharp and constant with a rating of 10/10.  She had no relief with Tylox.  The pain persisted through the next morning, and she described this to home  health nurse.  At that time, she was also noted be tachycardiac with a rate  around 140.  EMS was called for transport to [facility] Emergency  Department.  While in the emergency department, she did find some relief  with IV Dilaudid.   Her exam showed guarding in the abdomen, particularly in the right upper  quadrant.  Note, she is status post laparoscopic repair for incarcerated  ventral hernia, lysis of adhesions and cholecystectomy on February 28.  She  has been tolerating regular feeds since then and reports normal bowel  movements.  She has had no hematochezia but has experienced some mild  intermittent  nausea without vomiting.  She had had a slight increase in  shortness of breath secondary to her increasing pain but denied any  productive cough or fever.  On exam, both her abdominal and thoracotomy  incisions appeared to be healing well without signs of infection, and labs  were overall stable.  Due to the right upper quadrant guarding, Dr. Daphine Deutscher  did see her as well as in the emergency department but did not feel there is  any acute process occurring in the abdomen.  It was felt she should be  admitted for further evaluation and treatment including abdominal CT scan of  the chest and abdomen and pain management including Duragesic patch, IV  Dilaudid and oxycodone.   HOSPITAL COURSE:  Ms. Adsit was admitted to [facility] on July 19, 2005 for management of abdominal and right sided chest pain following a  recent thoracotomy for bronchogenic cyst.  Her workup included a CT scan of  the abdomen and chest which  showed mediastinal operative site, loculated air  fluid collection concerning for developing abscess or possible communication  with the tracheobronchial tree or esophagus.  There is also right lower lobe  atelectasis and small right effusion.  There were no acute findings of the  abdomen with no degenerative thoracolumbar lumbar spinal changes and  scoliosis.  Due to the concern for possible of fluid collection  communicating with the tracheobronchial tree or esophagus,  a Gastrografin  swallow study was ordered which showed no evidence of extravasation but  diminished primary esophageal peristaltic wave was noted.  She was started  on IV Maxipime as there was concern for possible abscess, although her  incision site is continued to appear free from sinus infection.  During her  hospital course, she did become more comfortable with the Duragesic patch.  She did, however, have a few episodes of nausea and vomiting initially which  were treated symptomatically.  These did resolve on their own.  Lab work  showed lipase of 18, amylase of 40, prealbumin of 11.4 and normal liver  function tests with a total bilirubin 0.4, alkaline phosphatase 55, AST 28,  ALT of 15.  A urine culture was also done which showed insignificant growth.  Her CBC showed a normal white count of 10.0, platelet count of 401. She did  have a drop in her hemoglobin and hematocrit during the hospitalization with  her admitting H&H 10.3 and 30.3, respectively; the latest hemoglobin and  hematocrit at 8.3 and 24.7 at the time of this dictation.  She has been  started on Niferex and folic acid as well as order for her stools to be sent  for Hemoccult that are still pending at this time.  During this  hospitalization, she also developed recurrent atrial fibrillation.  Of note,  her digoxin was held on admission due to some her vague symptoms.  Her atrial fibrillation was initially treated with Cardizem drip but was later   changed to amiodarone.  She was continued on metoprolol.  A cardiology  consult was requested for management of her atrial fibrillation, and she was  seen by Dr. Diona Browner on April 27 who felt that Coumadin did not need to be  started at this time but would follow her up on outpatient basis and make a  final decision based on any further arrhythmias.  So far, she had been  holding sinus rhythm on amiodarone.  If she continues to show good  progression, adequate pain control, stable  labs and maintained sinus rhythm,  it is anticipated that she will be ready for discharge home on either April  28 or July 24, 2005.  Other labs during this hospitalization not previously  mentioned include a normal TSH of 0.937.  Sodium 136, potassium 3.9,  chloride 104, CO2 28, blood glucose 92, BUN 4, creatinine 0.8, total protein  5.2, blood albumin 2.4, calcium 8.4.   MEDICATIONS:  1.  Xanax 0.5 mg p.o. q.h.s.  2.  Synthroid 150 mcg p.o. daily.  3.  Detrol LA 4 mg p.o. daily.  4.  Oxaprozin 600 mg p.o. daily.  5.  Nexium 40 mg p.o. daily.  6.  Metoprolol 25 mg daily.  7.  Neurontin 300 mg p.o. b.i.d.  8.  Multivitamin p.o. daily.  9.  Niferex 150 mg p.o. daily.  10. Duragesic patch 15 mcg per hour q.72h. (a total of five patches      prescribed).  11. Amiodarone 200 mg p.o. b.i.d.  12. Aspirin 325 mg p.o. daily,  13. Ceftin 500 mg p.o. b.i.d. for 1 week.  14. Tylox 1 to 2 tablets p.o. q.4h. p.r.n. pain.   DISCHARGE INSTRUCTIONS:  She is to follow a low-fat, low-salt diet.  She may  increase her activity slowly but should avoid driving and heavy lifting for  the next two to three weeks.  She may shower and clear her incisions gently  with soap and water. She should call if she develops fever greater than 101,  redness or drainage from her around her incision site or increasing pain or  shortness of breath.   FOLLOW UP:  She is to follow up with Dr. Donata Clay in the [facility] office on Aug 05, 2005 at  12:45 p.m. with a chest x-ray at [facility] at 11:45  a.m.  She is to call and schedule two-week follow-up with Dr. Diona Browner with  [name].  She is to continue to receive home health services  after discharge.      Jerold Coombe, P.A.      Kerin Perna, M.D.  Electronically Signed    AWZ/MEDQ  D:  07/22/2005  T:  07/23/2005  Job:  [id number]   cc:   Joni Fears D. Maple Hudson, M.D.  [facility]  [address]  [place]  [place] [address]   Lia Hopping  Fax: [phone]   Jonelle Sidle, M.D. [facility]  [address]  [place]  [place] [address]   [place] Daphine Deutscher, MD  [address]  [place]  [place] [address]

## 2011-01-28 ENCOUNTER — Telehealth: Payer: Self-pay | Admitting: *Deleted

## 2011-01-28 NOTE — Telephone Encounter (Signed)
Patient instructed nurse to give appt and leave information on message machine since she would be in and out all day. Appointment scheduled for 03/28/11 @1 :15pm with Dr. Andee Lineman and information left on machine per patient request.

## 2011-03-18 ENCOUNTER — Encounter: Payer: Self-pay | Admitting: *Deleted

## 2011-03-28 ENCOUNTER — Encounter: Payer: Self-pay | Admitting: Cardiology

## 2011-03-28 ENCOUNTER — Ambulatory Visit (INDEPENDENT_AMBULATORY_CARE_PROVIDER_SITE_OTHER): Payer: Medicare Other | Admitting: Cardiology

## 2011-03-28 VITALS — BP 124/80 | HR 61 | Ht 65.0 in | Wt 163.0 lb

## 2011-03-28 DIAGNOSIS — I10 Essential (primary) hypertension: Secondary | ICD-10-CM

## 2011-03-28 DIAGNOSIS — E785 Hyperlipidemia, unspecified: Secondary | ICD-10-CM

## 2011-03-28 DIAGNOSIS — I6529 Occlusion and stenosis of unspecified carotid artery: Secondary | ICD-10-CM

## 2011-03-28 DIAGNOSIS — I4891 Unspecified atrial fibrillation: Secondary | ICD-10-CM

## 2011-03-28 NOTE — Progress Notes (Signed)
Peyton Bottoms, MD, [facility] ABIM Board Certified in Adult Cardiovascular Medicine,Internal Medicine and Critical Care Medicine    CC: Routine followup patient with paroxysmal atrial fibrillation  HPI:  The patient is a 77 year old female with a history of paroxysmal atrial fibrillation without recurrence. She denies any chest pain or shortness of breath orthopnea PND. She does have vascular disease with significant right internal carotid artery stenosis 60-79%. 6 months ago she underwent carotid Dopplers which were currently stable. From a cardiac standpoint the patient is stable. She denies any orthopnea PND palpitations or syncope. She was told by primary care physician that she has anemia secondary to iron deficiency. She is now on iron supplementation. The patient is not clear as to the etiology of her iron deficiency     PMH: reviewed and listed in Problem List in Electronic Records (and see below) Past Medical History  Diagnosis Date  . Unspecified essential hypertension   . Other and unspecified hyperlipidemia   . Campath-induced atrial fibrillation     postop, 4/07, status post thoracotomy (bronchogenic cyst), treated  with amiodarone....current CHADS;2(HTN,age)  . Carotid artery disease     50-69% stenosis R.ICA,12.11   No past surgical history on file.   Allergies/SH/FHX : available in Electronic Records for review  Allergies  Allergen Reactions  . Morphine   . Penicillins    History   Social History  . Marital Status: Widowed    Spouse Name: N/A    Number of Children: N/A  . Years of Education: N/A   Occupational History  . Not on file.   Social History Main Topics  . Smoking status: Never Smoker   . Smokeless tobacco: Never Used  . Alcohol Use: No  . Drug Use: No  . Sexually Active: Not on file   Other Topics Concern  . Not on file   Social History Narrative  . No narrative on file   Family History  Problem Relation Age of Onset  . Cancer    .  Stroke    . Diabetes    . Hypertension      Medications: Current Outpatient Prescriptions  Medication Sig Dispense Refill  . ALPRAZolam (XANAX) 0.5 MG tablet Take 0.5 mg by mouth 2 (two) times daily as needed.        [name] aspirin EC 81 MG tablet Take 81 mg by mouth daily.        . Cholecalciferol (VITAMIN D3) 1000 UNITS CAPS Take 1 capsule by mouth daily.        [name] esomeprazole (NEXIUM) 40 MG capsule Take 40 mg by mouth daily as needed.        [name] estrogens, conjugated, (PREMARIN) 0.625 MG tablet Take 0.625 mg by mouth as directed. Take daily for 21 days then do not take for 7 days.       . fish oil-omega-3 fatty acids 1000 MG capsule Take 1 g by mouth daily.        [name] gabapentin (NEURONTIN) 300 MG capsule Take 2 capsules by mouth Every morning. & at bedtime      . Garlic Oil 1000 MG CAPS Take 1 capsule by mouth daily.        [name] HYDROcodone-acetaminophen (VICODIN) 5-500 MG per tablet Take 1 tablet by mouth every 6 (six) hours as needed.        . IRON CR PO Take 1 tablet by mouth daily.        [name] levothyroxine (SYNTHROID, LEVOTHROID) 175 MCG tablet Take  175 mcg by mouth daily.        . Multiple Vitamin (MULTIVITAMIN) tablet Take 1 tablet by mouth daily.        [name] oxaprozin (DAYPRO) 600 MG tablet Take 600 mg by mouth 2 (two) times daily.        . simvastatin (ZOCOR) 20 MG tablet Take 20 mg by mouth at bedtime.        . tolterodine (DETROL LA) 4 MG 24 hr capsule Take 4 mg by mouth daily.        . vitamin A 8000 UNIT capsule Take 8,000 Units by mouth daily.        . vitamin C (ASCORBIC ACID) 500 MG tablet Take 500 mg by mouth daily.          ROS: No nausea or vomiting. No fever or chills.No melena or hematochezia.No bleeding.No claudication.  Physical Exam: BP 124/80  Pulse 61  Ht 5\' 5"  (1.651 m)  Wt 163 lb (73.936 kg)  BMI 27.12 kg/m2 General: Well-nourished white female somewhat pale-appearing Neck: Normal carotid upstroke right carotid bruit. No thyromegaly nonnodular thyroid. JVP is  approximately 6 cm. Lungs: Clear breath sounds bilaterally. No wheezing Cardiac: Regular rate and rhythm with normal S1-S2 no rubs or gallops Vascular: No edema. Normal distal pulses bilaterally Skin: Warm and dry Physcologic: Normal affect  12lead ECG: Limited bedside ECHO:N/A   Assessment and Plan

## 2011-03-28 NOTE — Assessment & Plan Note (Signed)
Blood pressure well controlled

## 2011-03-28 NOTE — Assessment & Plan Note (Signed)
Followup carotid Dopplers in 6 months.

## 2011-03-28 NOTE — Assessment & Plan Note (Signed)
No clear documentation in the past of atrial fibrillation. Patient was never placed on Coumadin. No evidence of atrial fibrillation currently.

## 2011-03-28 NOTE — Patient Instructions (Signed)
Continue all current medications. Carotid dopplers in 6 months before next office visit Your physician wants you to follow up in: 6 months.  You will receive a reminder letter in the mail one-two months in advance.  If you don't receive a letter, please call our office to schedule the follow up appointment

## 2011-03-28 NOTE — Assessment & Plan Note (Signed)
Followed by the patient's primary care physician 

## 2011-05-17 ENCOUNTER — Other Ambulatory Visit: Payer: Self-pay | Admitting: Orthopedic Surgery

## 2011-05-18 ENCOUNTER — Telehealth: Payer: Self-pay | Admitting: *Deleted

## 2011-05-18 NOTE — Telephone Encounter (Signed)
Patient returned call.  Notified will fax clearance to MD as soon as GD returns.

## 2011-05-18 NOTE — Telephone Encounter (Signed)
Patient left message on voicemail - having total knee replacement & wanted to let us know that MD would be sending clearance papers.  Received fax yesterday evening & paper was given to Dr. Andee Lineman.   Left message to return call.

## 2011-05-25 ENCOUNTER — Other Ambulatory Visit: Payer: Self-pay | Admitting: Cardiology

## 2011-05-25 DIAGNOSIS — I6529 Occlusion and stenosis of unspecified carotid artery: Secondary | ICD-10-CM

## 2011-07-20 ENCOUNTER — Encounter (INDEPENDENT_AMBULATORY_CARE_PROVIDER_SITE_OTHER): Payer: Medicare Other

## 2011-07-20 DIAGNOSIS — I6529 Occlusion and stenosis of unspecified carotid artery: Secondary | ICD-10-CM

## 2011-07-25 NOTE — H&P (Signed)
[name] [name] DOB: Feb 16, 1931  Chief Complaint: left knee pain  History of Present Illness The patient is a 78 year old female who comes in today for a preoperative History and Physical. The patient is scheduled for a left total knee arthroplasty to be performed by Dr. Gus Rankin. Aluisio, MD at [facility] on Wednesday Aug 10, 2011 . She states that she has dealt with knee pain for over 20 years after she fell at work. She reports that the pain in worse in the left knee but the right is quite painful itself. She reports AM stiffness and pain with walking. She denies locking and catching symptoms. She reports that she feels that her knees are becoming increasingly unstable. She is still quite active, working in her garden and yard. She currently takes medication daily for her arthritis. She is very concerned as the knee has been buckling more and it is getting more deformed. She is very concerned about falling. She has had multiple near falls because of the knee giving out on her. The right knee bothers her some but no where as near as bad as the left. She has not had any visco supplement injections, but she has had cortisone injections which have not been beneficial. Due to failure of conservative measures, the most predictable means for decreased pain and increased function in the left knee is a left total knee arthroplasty. Risks and benefits of the surgery discussed. PCP: Dr. Starling Manns Cardio: Dr. Andee Lineman   Past MedicalHistory Osteoarthritis, Knee (715.96) Sprain/strain, rotator cuff (840.4). 04/19/1991 Degeneration, lumbar/lumbosacral disc (722.52). 12/27/1996 Contusion, wrist (923.21). 04/14/2000 Syndrome, carpal tunnel (354.0). 10/25/2000 Pain in joint, shoulder (719.41). 10/25/2000 Pain in limb (729.5). 10/25/2000 Gastroesophageal Reflux Disease Hypercholesterolemia Rheumatoid Arthritis Vertigo Bronchitis Varicose veins Urinary Tract  Infection Hypothyroidism Measles. childhood Mumps. childhood   Allergies Morphine Sulfate *ANALGESICS - OPIOID*. Rash. Penicillin VK *PENICILLINS*. Rash.   Family History Cerebrovascular Accident. grandfather fathers side Father. deceased age at 52 due to complications from a stroke Mother. deceased age 69 due to complications from old age Siblings. Brother, Sister. cancer   Social History Marital status. widowed Number of flights of stairs before winded. 2-3 Pain Contract. no Exercise. Exercises weekly; does running / walking and other Illicit drug use. no Living situation. live alone Tobacco / smoke exposure. no Tobacco use. never smoker Current work status. retired Alcohol use. never consumed alcohol Children. 2 Advance Directives. healthcare POA Post-Surgical Plans. [facility]   Medication History ALPRAZolam (0.5MG  Tablet, Oral three times daily, as needed) Active. Aspirin EC (81MG  Tablet DR, Oral daily) Active. Cholecalciferol (1000UNIT Capsule, Oral daily) Active. Esomeprazole Magnesium (40MG  Packet, Oral daily) Active. Estrogens Conj Synthetic A (0.625MG  Tablet, Oral) Active. Fish Oil Burp-Less (1000MG  Capsule, Oral daily) Active. Gabapentin (300MG  Capsule, two tablets Oral daily) Active. Garlic Oil (1000MG  Capsule, Oral daily) Active. Hydrocodone-Acetaminophen (5-500MG  Tablet, Oral four times daily, as needed) Active. Iron CR (50MG  Capsule ER, Oral daily) Active. Levothyroxine Sodium ( Tablet, Oral daily) Active. Multivitamins ( Oral daily) Active. Oxaprozin (600MG  Tablet, Oral daily) Active. Simvastatin (20MG  Tablet, Oral daily) Active. Tolterodine Tartrate (2MG  Tablet, two tablets Oral daily) Active. Vitamin A (8000UNIT Capsule, Oral daily) Active. Vitamin C Plus (500MG  Tablet, Oral daily) Active. Calcium 600 (600MG  Tablet, Oral four times daily, as needed) Active.   Pregnancy / Birth History Pregnant.  no   Past Surgical History Tubal Ligation Appendectomy. as a child Gallbladder Surgery. laporoscopic 2007 Rotator Cuff Repair. bilateral 1992, 1993 Bronchogenic Cyst removed 2007  Review of Systems General:Not Present- Chills, Fever, Night Sweats, Fatigue, Weight Gain, Weight Loss and Memory Loss. Skin:Not Present- Hives, Itching, Rash, Eczema and Lesions. HEENT:Not Present- Tinnitus, Headache, Double Vision, Visual Loss, Hearing Loss and Dentures. Respiratory:Present- Shortness of breath with exertion, Cough and Wheezing. Not Present- Shortness of breath at rest, Allergies, Coughing up blood and Chronic Cough. Cardiovascular:Present- Leg Pain and/or Swelling. Not Present- Chest Pain, Racing/skipping heartbeats, Difficulty Breathing Lying Down, Murmur, Swelling and Palpitations. Gastrointestinal:Not Present- Bloody Stool, Heartburn, Abdominal Pain, Vomiting, Nausea, Constipation, Diarrhea, Difficulty Swallowing, Jaundice and Loss of appetitie. Female Genitourinary:Present- Urinary frequency. Not Present- Blood in Urine, Weak urinary stream, Discharge, Flank Pain, Incontinence, Painful Urination, Urgency, Urinary Retention and Urinating at Night. Musculoskeletal:Present- Joint Swelling, Joint Pain, Back Pain and Morning Stiffness. Not Present- Muscle Weakness, Muscle Pain and Spasms. Neurological:Not Present- Tremor, Dizziness, Blackout spells, Paralysis, Difficulty with balance and Weakness. Psychiatric:Present- Insomnia.   Vitals Weight: 169 lb Height: 65 in Body Surface Area: 1.87 m Body Mass Index: 28.12 kg/m Pulse: 60 (Regular) Resp.: 17 (Unlabored) BP: 126/78 (Sitting, Left Arm, Standard)    Physical Exam General Mental Status - Alert, cooperative and good historian. General Appearance- pleasant. Not in acute distress. Orientation- Oriented X3. Build & Nutrition- Overweight, Well nourished and Well developed. Head and Neck Head-  normocephalic, atraumatic . Neck Global Assessment- supple. no bruit auscultated on the right and no bruit auscultated on the left. Eye Pupil- Bilateral- Normal. Motion- Bilateral- EOMI. Chest and Lung Exam Auscultation: Breath sounds:- clear at anterior chest wall and - clear at posterior chest wall. Adventitious sounds:- No Adventitious sounds Cardiovascular Auscultation:Rhythm- Regular rate and rhythm. Heart Sounds- S1 WNL and S2 WNL. Early systolic murmur over aortic region.  Abdomen Palpation/Percussion:Tenderness- Abdomen is non-tender to palpation. Rigidity (guarding)- Abdomen is soft. Auscultation:Auscultation of the abdomen reveals - Bowel sounds normal. Female Genitourinary Not done, not pertinent to present illness Peripheral Vascular Upper Extremity: Palpation:- Pulses bilaterally normal. Lower Extremity: Palpation:- Pulses bilaterally normal Neurologic Examination of related systems reveals - normal muscle strength and tone in all extremities. Neurologic evaluation reveals - normal sensation and upper and lower extremity deep tendon reflexes intact bilaterally . Musculoskeletal Both hips have a normal range of motion with no discomfort. The left knee shows a very mild swelling. She has a significant varus deformity. Her range of motion is about 5-120. There is no instability noted. The right knee with no deformity. Range is 5-125 with moderate crepitus on range of motion. Slightly tender medial greater than lateral. No instability is noted. Pulses, sensation and motor are intact in both lower extremities.   RADIOGRAPHS: AP of both knees and lateral show a severe erosive end stage arthritis of the left worse than right knee. She is bone on bone in the medial and patellofemoral compartments with significant bony erosion of the proximal medial tibia and about a 10 degree varus deformity on the left. She has about a 5 degree varus on the right with  some erosion to the tibia but less than on the left side.  Assessment & Plan Osteoarthritis, Knee (715.96) Left total knee arthroplasty      Dimitri Ped, PA-C

## 2011-07-27 ENCOUNTER — Telehealth: Payer: Self-pay | Admitting: *Deleted

## 2011-07-27 NOTE — Telephone Encounter (Signed)
Message copied by Murriel Hopper on Wed Jul 27, 2011 11:39 AM ------      Message from: Lewayne Bunting E      Created: Wed Jul 27, 2011  6:01 AM       Moderate right carotid FU fu 6 months

## 2011-07-27 NOTE — Telephone Encounter (Signed)
Notes Recorded by Lesle Chris, LPN on 08/02/8467 at 11:39 AM Left message to return call.

## 2011-08-01 NOTE — Telephone Encounter (Signed)
Notes Recorded by Lesle Chris, LPN on 04/01/7827 at 11:53 AM Left message to return call.

## 2011-08-01 NOTE — Telephone Encounter (Signed)
Patient returned call

## 2011-08-01 NOTE — Telephone Encounter (Signed)
Notes Recorded by Lesle Chris, LPN on 04/02/1094 at 12:08 PM Patient notified and verbalized understanding. States she is getting ready to have knee replacements back to back very soon. Advised her that we will send her a reminder in the mail when time. States she will try to do this, but has a lot of pain in neck several weeks after doing test.

## 2011-08-02 ENCOUNTER — Encounter (HOSPITAL_COMMUNITY): Payer: Self-pay | Admitting: Pharmacy Technician

## 2011-08-02 ENCOUNTER — Encounter (HOSPITAL_COMMUNITY): Payer: Self-pay

## 2011-08-02 ENCOUNTER — Ambulatory Visit (HOSPITAL_COMMUNITY)
Admission: RE | Admit: 2011-08-02 | Discharge: 2011-08-02 | Disposition: A | Discharge status: Home / Self Care | Payer: Medicare Other | Source: Ambulatory Visit | Attending: Orthopedic Surgery | Admitting: Orthopedic Surgery

## 2011-08-02 ENCOUNTER — Encounter (HOSPITAL_COMMUNITY)
Admission: RE | Admit: 2011-08-02 | Discharge: 2011-08-02 | Disposition: A | Discharge status: Home / Self Care | Payer: Medicare Other | Source: Ambulatory Visit | Attending: Orthopedic Surgery | Admitting: Orthopedic Surgery

## 2011-08-02 DIAGNOSIS — Z01812 Encounter for preprocedural laboratory examination: Secondary | ICD-10-CM | POA: Insufficient documentation

## 2011-08-02 DIAGNOSIS — M4120 Other idiopathic scoliosis, site unspecified: Secondary | ICD-10-CM | POA: Insufficient documentation

## 2011-08-02 HISTORY — DX: Gastro-esophageal reflux disease without esophagitis: K21.9

## 2011-08-02 HISTORY — DX: Hypothyroidism, unspecified: E03.9

## 2011-08-02 HISTORY — DX: Unspecified osteoarthritis, unspecified site: M19.90

## 2011-08-02 HISTORY — DX: Anemia, unspecified: D64.9

## 2011-08-02 LAB — URINALYSIS, ROUTINE W REFLEX MICROSCOPIC
Leukocytes, UA: NEGATIVE
Protein, ur: NEGATIVE mg/dL
Urobilinogen, UA: 0.2 mg/dL (ref 0.0–1.0)

## 2011-08-02 LAB — COMPREHENSIVE METABOLIC PANEL
BUN: 11 mg/dL (ref 6–23)
Calcium: 8.9 mg/dL (ref 8.4–10.5)
Creatinine, Ser: 0.74 mg/dL (ref 0.50–1.10)
GFR calc Af Amer: >90 mL/min (ref >90)
Glucose, Bld: 96 mg/dL (ref 70–99)
Sodium: 139 mEq/L (ref 135–145)
Total Protein: 7.7 g/dL (ref 6.0–8.3)

## 2011-08-02 LAB — SURGICAL PCR SCREEN: MRSA, PCR: NEGATIVE

## 2011-08-02 LAB — CBC
HCT: 38.6 % (ref 36.0–46.0)
Hemoglobin: 12.5 g/dL (ref 12.0–15.0)
MCH: 29.6 pg (ref 26.0–34.0)
MCHC: 32.4 g/dL (ref 30.0–36.0)

## 2011-08-02 LAB — PROTIME-INR: Prothrombin Time: 13.2 seconds (ref 11.6–15.2)

## 2011-08-02 NOTE — Patient Instructions (Signed)
[address] MIGNONNE AFONSO  08/02/2011   Your procedure is scheduled on: 08/10/11   Wednesday  Surgery [id number]   Report to [facility] at     1015 AM    Call this number if you have problems the morning of surgery: [phone]     Or PST   [phone]  [name]   Remember:   Do not eat food   Or drink fluids:After Midnight. Tuesday NIGHT  STOP ASPIRIN AND VITAMINS AS DIRECTED BY OFFICE   Take these medicines the morning of surgery with A SIP OF WATER:    NEXIUM,LEVOTHYROXINE, DETROL                                APRAZOLAM,VICODIN IF NEEDED   Do not wear jewelry, make-up or nail polish.  Do not wear lotions, powders, or perfumes. You may wear deodorant.  Do not shave 48 hours prior to surgery.  Do not bring valuables to the hospital.  Contacts, dentures or bridgework may not be worn into surgery.  Leave suitcase in the car. After surgery it may be brought to your room.  For patients admitted to the hospital, checkout time is 11:00 AM the day of discharge.   Patients discharged the day of surgery will not be allowed to drive home.  Name and phone number of your driver:      REHAB                                                                Special Instructions: CHG Shower Use Special Wash: 1/2 bottle night before surgery and 1/2 bottle morning of surgery. REGULAR SOAP FACE AND PRIVATES              LADIES- NO SHAVING 48 HOURS BEFORE USING BETASEPT SOAP.                Please read over the following fact sheets that you were given: MRSA Information

## 2011-08-09 MED ORDER — BUPIVACAINE 0.25 % ON-Q PUMP SINGLE CATH 300ML
300.0000 mL | INJECTION | Status: DC
  Filled 2011-08-09: qty 300

## 2011-08-10 ENCOUNTER — Encounter (HOSPITAL_COMMUNITY): Payer: Self-pay | Admitting: Orthopedic Surgery

## 2011-08-10 ENCOUNTER — Encounter (HOSPITAL_COMMUNITY)
Admission: RE | Disposition: A | Discharge status: Skilled Nursing Facility | Payer: Self-pay | Source: Ambulatory Visit | Attending: Orthopedic Surgery

## 2011-08-10 ENCOUNTER — Encounter (HOSPITAL_COMMUNITY): Payer: Self-pay | Admitting: Anesthesiology

## 2011-08-10 ENCOUNTER — Inpatient Hospital Stay (HOSPITAL_COMMUNITY)
Admission: RE | Admit: 2011-08-10 | Discharge: 2011-08-18 | DRG: 469 | Disposition: A | Discharge status: Skilled Nursing Facility | Payer: Medicare Other | Source: Ambulatory Visit | Attending: Orthopedic Surgery | Admitting: Orthopedic Surgery

## 2011-08-10 ENCOUNTER — Encounter (HOSPITAL_COMMUNITY): Payer: Self-pay | Admitting: *Deleted

## 2011-08-10 ENCOUNTER — Ambulatory Visit (HOSPITAL_COMMUNITY): Payer: Medicare Other | Admitting: Anesthesiology

## 2011-08-10 DIAGNOSIS — R112 Nausea with vomiting, unspecified: Secondary | ICD-10-CM | POA: Diagnosis not present

## 2011-08-10 DIAGNOSIS — R509 Fever, unspecified: Secondary | ICD-10-CM | POA: Diagnosis present

## 2011-08-10 DIAGNOSIS — Z96659 Presence of unspecified artificial knee joint: Secondary | ICD-10-CM

## 2011-08-10 DIAGNOSIS — R748 Abnormal levels of other serum enzymes: Secondary | ICD-10-CM | POA: Diagnosis not present

## 2011-08-10 DIAGNOSIS — M179 Osteoarthritis of knee, unspecified: Secondary | ICD-10-CM | POA: Diagnosis present

## 2011-08-10 DIAGNOSIS — I10 Essential (primary) hypertension: Secondary | ICD-10-CM | POA: Diagnosis present

## 2011-08-10 DIAGNOSIS — R9389 Abnormal findings on diagnostic imaging of other specified body structures: Secondary | ICD-10-CM | POA: Diagnosis present

## 2011-08-10 DIAGNOSIS — R918 Other nonspecific abnormal finding of lung field: Secondary | ICD-10-CM | POA: Diagnosis present

## 2011-08-10 DIAGNOSIS — D696 Thrombocytopenia, unspecified: Secondary | ICD-10-CM

## 2011-08-10 DIAGNOSIS — E871 Hypo-osmolality and hyponatremia: Secondary | ICD-10-CM | POA: Diagnosis not present

## 2011-08-10 DIAGNOSIS — Z9889 Other specified postprocedural states: Secondary | ICD-10-CM

## 2011-08-10 DIAGNOSIS — R7989 Other specified abnormal findings of blood chemistry: Secondary | ICD-10-CM | POA: Diagnosis not present

## 2011-08-10 DIAGNOSIS — D62 Acute posthemorrhagic anemia: Secondary | ICD-10-CM | POA: Diagnosis not present

## 2011-08-10 DIAGNOSIS — M1710 Unilateral primary osteoarthritis, unspecified knee: Principal | ICD-10-CM | POA: Diagnosis present

## 2011-08-10 DIAGNOSIS — I6529 Occlusion and stenosis of unspecified carotid artery: Secondary | ICD-10-CM | POA: Diagnosis present

## 2011-08-10 DIAGNOSIS — E876 Hypokalemia: Secondary | ICD-10-CM | POA: Diagnosis not present

## 2011-08-10 DIAGNOSIS — I4891 Unspecified atrial fibrillation: Secondary | ICD-10-CM | POA: Diagnosis not present

## 2011-08-10 DIAGNOSIS — D6959 Other secondary thrombocytopenia: Secondary | ICD-10-CM | POA: Diagnosis not present

## 2011-08-10 DIAGNOSIS — E039 Hypothyroidism, unspecified: Secondary | ICD-10-CM | POA: Diagnosis present

## 2011-08-10 DIAGNOSIS — Z9289 Personal history of other medical treatment: Secondary | ICD-10-CM

## 2011-08-10 DIAGNOSIS — G9340 Encephalopathy, unspecified: Secondary | ICD-10-CM | POA: Diagnosis present

## 2011-08-10 DIAGNOSIS — R079 Chest pain, unspecified: Secondary | ICD-10-CM | POA: Diagnosis not present

## 2011-08-10 HISTORY — PX: TOTAL KNEE ARTHROPLASTY: SHX125

## 2011-08-10 LAB — ABO/RH: ABO/RH(D): A POS

## 2011-08-10 SURGERY — ARTHROPLASTY, KNEE, TOTAL
Anesthesia: General | Site: Knee | Laterality: Left | Wound class: Clean

## 2011-08-10 MED ORDER — BUPIVACAINE 0.25 % ON-Q PUMP SINGLE CATH 300ML
INJECTION | Status: DC | PRN
  Administered 2011-08-10: 300 mL

## 2011-08-10 MED ORDER — HYDROMORPHONE 0.3 MG/ML IV SOLN
INTRAVENOUS | Status: DC
  Administered 2011-08-10: 2.09 mg via INTRAVENOUS
  Administered 2011-08-10: 19:00:00 via INTRAVENOUS
  Administered 2011-08-11: 0.6 mg via INTRAVENOUS
  Administered 2011-08-11: 1.79 mg via INTRAVENOUS
  Administered 2011-08-11: 0.4 mg via INTRAVENOUS
  Filled 2011-08-10: qty 25

## 2011-08-10 MED ORDER — ETOMIDATE 2 MG/ML IV SOLN
INTRAVENOUS | Status: DC | PRN
  Administered 2011-08-10: 14 mg via INTRAVENOUS

## 2011-08-10 MED ORDER — DEXAMETHASONE SODIUM PHOSPHATE 10 MG/ML IJ SOLN
INTRAMUSCULAR | Status: DC | PRN
  Administered 2011-08-10: 10 mg via INTRAVENOUS

## 2011-08-10 MED ORDER — ONDANSETRON HCL 4 MG/2ML IJ SOLN
4.0000 mg | Freq: Four times a day (QID) | INTRAMUSCULAR | Status: DC | PRN
  Administered 2011-08-10 – 2011-08-11 (×2): 4 mg via INTRAVENOUS
  Filled 2011-08-10 (×2): qty 2

## 2011-08-10 MED ORDER — BUPIVACAINE ON-Q PAIN PUMP (FOR ORDER SET NO CHG)
INJECTION | Status: DC
  Filled 2011-08-10: qty 1

## 2011-08-10 MED ORDER — FENTANYL CITRATE 0.05 MG/ML IJ SOLN
INTRAMUSCULAR | Status: AC
  Filled 2011-08-10: qty 2

## 2011-08-10 MED ORDER — MENTHOL 3 MG MT LOZG: 1.0000 | LOZENGE | OROMUCOSAL | Status: DC | PRN

## 2011-08-10 MED ORDER — DIPHENHYDRAMINE HCL 12.5 MG/5ML PO ELIX: 12.5000 mg | ORAL_SOLUTION | Freq: Four times a day (QID) | ORAL | Status: DC | PRN

## 2011-08-10 MED ORDER — GABAPENTIN 300 MG PO CAPS
600.0000 mg | ORAL_CAPSULE | Freq: Every day | ORAL | Status: DC
  Administered 2011-08-10 – 2011-08-17 (×8): 600 mg via ORAL
  Filled 2011-08-10 (×9): qty 2

## 2011-08-10 MED ORDER — ONDANSETRON HCL 4 MG/2ML IJ SOLN
4.0000 mg | Freq: Four times a day (QID) | INTRAMUSCULAR | Status: DC | PRN
  Administered 2011-08-11 – 2011-08-14 (×8): 4 mg via INTRAVENOUS
  Filled 2011-08-10 (×8): qty 2

## 2011-08-10 MED ORDER — 0.9 % SODIUM CHLORIDE (POUR BTL) OPTIME
TOPICAL | Status: DC | PRN
  Administered 2011-08-10: 1000 mL

## 2011-08-10 MED ORDER — SODIUM CHLORIDE 0.9 % IJ SOLN: 9.0000 mL | INTRAMUSCULAR | Status: DC | PRN

## 2011-08-10 MED ORDER — DEXTROSE-NACL 5-0.9 % IV SOLN
INTRAVENOUS | Status: DC
  Administered 2011-08-10 – 2011-08-11 (×2): via INTRAVENOUS

## 2011-08-10 MED ORDER — METHOCARBAMOL 100 MG/ML IJ SOLN
500.0000 mg | Freq: Four times a day (QID) | INTRAVENOUS | Status: DC | PRN
  Administered 2011-08-10: 500 mg via INTRAVENOUS
  Filled 2011-08-10: qty 5

## 2011-08-10 MED ORDER — LIDOCAINE HCL (CARDIAC) 20 MG/ML IV SOLN
INTRAVENOUS | Status: DC | PRN
  Administered 2011-08-10: 50 mg via INTRAVENOUS

## 2011-08-10 MED ORDER — ACETAMINOPHEN 10 MG/ML IV SOLN
INTRAVENOUS | Status: AC
  Filled 2011-08-10: qty 100

## 2011-08-10 MED ORDER — FENTANYL CITRATE 0.05 MG/ML IJ SOLN
25.0000 ug | INTRAMUSCULAR | Status: DC | PRN
  Administered 2011-08-10 (×3): 25 ug via INTRAVENOUS
  Administered 2011-08-10: 12.5 ug via INTRAVENOUS
  Administered 2011-08-10: 25 ug via INTRAVENOUS

## 2011-08-10 MED ORDER — SUCCINYLCHOLINE CHLORIDE 20 MG/ML IJ SOLN
INTRAMUSCULAR | Status: DC | PRN
  Administered 2011-08-10: 8 mg via INTRAVENOUS

## 2011-08-10 MED ORDER — METOCLOPRAMIDE HCL 5 MG/ML IJ SOLN
5.0000 mg | Freq: Three times a day (TID) | INTRAMUSCULAR | Status: DC | PRN
  Administered 2011-08-11 – 2011-08-12 (×3): 10 mg via INTRAVENOUS
  Filled 2011-08-10 (×3): qty 2

## 2011-08-10 MED ORDER — VANCOMYCIN HCL 1000 MG IV SOLR
1000.0000 mg | INTRAVENOUS | Status: DC | PRN
  Administered 2011-08-10: 1000 mg via INTRAVENOUS

## 2011-08-10 MED ORDER — PHENYLEPHRINE HCL 10 MG/ML IJ SOLN
INTRAMUSCULAR | Status: DC | PRN
  Administered 2011-08-10 (×2): 20 ug via INTRAVENOUS
  Administered 2011-08-10: 40 ug via INTRAVENOUS

## 2011-08-10 MED ORDER — VANCOMYCIN HCL IN DEXTROSE 1-5 GM/200ML-% IV SOLN
INTRAVENOUS | Status: AC
  Filled 2011-08-10: qty 200

## 2011-08-10 MED ORDER — ACETAMINOPHEN 325 MG PO TABS
650.0000 mg | ORAL_TABLET | Freq: Four times a day (QID) | ORAL | Status: DC | PRN
  Administered 2011-08-12 – 2011-08-14 (×3): 650 mg via ORAL
  Filled 2011-08-10 (×3): qty 2

## 2011-08-10 MED ORDER — DIPHENHYDRAMINE HCL 50 MG/ML IJ SOLN: 12.5000 mg | Freq: Four times a day (QID) | INTRAMUSCULAR | Status: DC | PRN

## 2011-08-10 MED ORDER — FESOTERODINE FUMARATE ER 8 MG PO TB24
8.0000 mg | ORAL_TABLET | Freq: Every day | ORAL | Status: DC
  Administered 2011-08-11 – 2011-08-18 (×7): 8 mg via ORAL
  Filled 2011-08-10 (×9): qty 1

## 2011-08-10 MED ORDER — ONDANSETRON HCL 4 MG PO TABS: 4.0000 mg | ORAL_TABLET | Freq: Four times a day (QID) | ORAL | Status: DC | PRN

## 2011-08-10 MED ORDER — POLYETHYLENE GLYCOL 3350 17 G PO PACK
17.0000 g | PACK | Freq: Every day | ORAL | Status: DC | PRN
  Filled 2011-08-10: qty 1

## 2011-08-10 MED ORDER — ACETAMINOPHEN 10 MG/ML IV SOLN
1000.0000 mg | Freq: Four times a day (QID) | INTRAVENOUS | Status: AC
  Administered 2011-08-10 – 2011-08-11 (×4): 1000 mg via INTRAVENOUS
  Filled 2011-08-10 (×5): qty 100

## 2011-08-10 MED ORDER — MIDAZOLAM HCL 5 MG/5ML IJ SOLN
INTRAMUSCULAR | Status: DC | PRN
  Administered 2011-08-10: .25 mg via INTRAVENOUS
  Administered 2011-08-10: 1 mg via INTRAVENOUS

## 2011-08-10 MED ORDER — PANTOPRAZOLE SODIUM 40 MG PO TBEC
80.0000 mg | DELAYED_RELEASE_TABLET | Freq: Every day | ORAL | Status: DC
  Administered 2011-08-11: 80 mg via ORAL
  Filled 2011-08-10: qty 2

## 2011-08-10 MED ORDER — OXYCODONE HCL 5 MG PO TABS
5.0000 mg | ORAL_TABLET | ORAL | Status: DC | PRN
  Administered 2011-08-10 – 2011-08-11 (×3): 5 mg via ORAL
  Filled 2011-08-10 (×3): qty 1

## 2011-08-10 MED ORDER — LACTATED RINGERS IV SOLN
INTRAVENOUS | Status: DC | PRN
  Administered 2011-08-10 (×2): via INTRAVENOUS

## 2011-08-10 MED ORDER — HYDROMORPHONE HCL PF 1 MG/ML IJ SOLN
0.5000 mg | INTRAMUSCULAR | Status: DC | PRN
  Administered 2011-08-10: 1 mg via INTRAVENOUS
  Filled 2011-08-10: qty 1

## 2011-08-10 MED ORDER — FLEET ENEMA 7-19 GM/118ML RE ENEM: 1.0000 | ENEMA | Freq: Once | RECTAL | Status: AC | PRN

## 2011-08-10 MED ORDER — DOCUSATE SODIUM 100 MG PO CAPS
100.0000 mg | ORAL_CAPSULE | Freq: Two times a day (BID) | ORAL | Status: DC
  Administered 2011-08-10 – 2011-08-18 (×15): 100 mg via ORAL
  Filled 2011-08-10 (×13): qty 1

## 2011-08-10 MED ORDER — PHENOL 1.4 % MT LIQD: 1.0000 | OROMUCOSAL | Status: DC | PRN

## 2011-08-10 MED ORDER — ONDANSETRON HCL 4 MG/2ML IJ SOLN
INTRAMUSCULAR | Status: DC | PRN
  Administered 2011-08-10 (×2): 2 mg via INTRAVENOUS

## 2011-08-10 MED ORDER — METOCLOPRAMIDE HCL 10 MG PO TABS: 5.0000 mg | ORAL_TABLET | Freq: Three times a day (TID) | ORAL | Status: DC | PRN

## 2011-08-10 MED ORDER — SODIUM CHLORIDE 0.9 % IV SOLN: INTRAVENOUS | Status: DC

## 2011-08-10 MED ORDER — METHOCARBAMOL 500 MG PO TABS
500.0000 mg | ORAL_TABLET | Freq: Four times a day (QID) | ORAL | Status: DC | PRN
  Administered 2011-08-11: 500 mg via ORAL
  Filled 2011-08-10: qty 1

## 2011-08-10 MED ORDER — TEMAZEPAM 15 MG PO CAPS: 15.0000 mg | ORAL_CAPSULE | Freq: Every evening | ORAL | Status: DC | PRN

## 2011-08-10 MED ORDER — DIPHENHYDRAMINE HCL 12.5 MG/5ML PO ELIX: 12.5000 mg | ORAL_SOLUTION | ORAL | Status: DC | PRN

## 2011-08-10 MED ORDER — LEVOTHYROXINE SODIUM 175 MCG PO TABS
175.0000 ug | ORAL_TABLET | Freq: Every day | ORAL | Status: DC
  Administered 2011-08-11 – 2011-08-18 (×7): 175 ug via ORAL
  Filled 2011-08-10 (×9): qty 1

## 2011-08-10 MED ORDER — ACETAMINOPHEN 10 MG/ML IV SOLN
INTRAVENOUS | Status: DC | PRN
  Administered 2011-08-10: 1000 mg via INTRAVENOUS

## 2011-08-10 MED ORDER — VANCOMYCIN HCL IN DEXTROSE 1-5 GM/200ML-% IV SOLN: 1000.0000 mg | INTRAVENOUS | Status: DC

## 2011-08-10 MED ORDER — ALPRAZOLAM 0.5 MG PO TABS
0.5000 mg | ORAL_TABLET | Freq: Three times a day (TID) | ORAL | Status: DC | PRN
  Administered 2011-08-10 – 2011-08-18 (×8): 0.5 mg via ORAL
  Filled 2011-08-10 (×8): qty 1

## 2011-08-10 MED ORDER — SODIUM CHLORIDE 0.9 % IR SOLN
Status: DC | PRN
  Administered 2011-08-10: 1000 mL

## 2011-08-10 MED ORDER — SIMVASTATIN 20 MG PO TABS
20.0000 mg | ORAL_TABLET | Freq: Every day | ORAL | Status: DC
  Administered 2011-08-12: 20 mg via ORAL
  Filled 2011-08-10 (×3): qty 1

## 2011-08-10 MED ORDER — ACETAMINOPHEN 650 MG RE SUPP: 650.0000 mg | Freq: Four times a day (QID) | RECTAL | Status: DC | PRN

## 2011-08-10 MED ORDER — VANCOMYCIN HCL IN DEXTROSE 1-5 GM/200ML-% IV SOLN
1000.0000 mg | Freq: Two times a day (BID) | INTRAVENOUS | Status: AC
  Administered 2011-08-11: 1000 mg via INTRAVENOUS
  Filled 2011-08-10: qty 200

## 2011-08-10 MED ORDER — BUPIVACAINE 0.25 % ON-Q PUMP SINGLE CATH 300ML
INJECTION | Status: AC
  Filled 2011-08-10: qty 300

## 2011-08-10 MED ORDER — DEXAMETHASONE SODIUM PHOSPHATE 10 MG/ML IJ SOLN: 10.0000 mg | Freq: Once | INTRAMUSCULAR | Status: DC

## 2011-08-10 MED ORDER — ACETAMINOPHEN 10 MG/ML IV SOLN: 1000.0000 mg | Freq: Once | INTRAVENOUS | Status: DC

## 2011-08-10 MED ORDER — HYDROMORPHONE HCL PF 1 MG/ML IJ SOLN
INTRAMUSCULAR | Status: DC | PRN
  Administered 2011-08-10 (×4): 0.5 mg via INTRAVENOUS

## 2011-08-10 MED ORDER — TRAMADOL HCL 50 MG PO TABS
50.0000 mg | ORAL_TABLET | Freq: Four times a day (QID) | ORAL | Status: DC | PRN
  Administered 2011-08-11: 50 mg via ORAL
  Administered 2011-08-13: 100 mg via ORAL
  Administered 2011-08-13 (×2): 50 mg via ORAL
  Administered 2011-08-13: 100 mg via ORAL
  Administered 2011-08-14: 50 mg via ORAL
  Administered 2011-08-14 – 2011-08-18 (×6): 100 mg via ORAL
  Filled 2011-08-10: qty 1
  Filled 2011-08-10: qty 2
  Filled 2011-08-10: qty 1
  Filled 2011-08-10 (×10): qty 2

## 2011-08-10 MED ORDER — FENTANYL CITRATE 0.05 MG/ML IJ SOLN
INTRAMUSCULAR | Status: DC | PRN
  Administered 2011-08-10: 100 ug via INTRAVENOUS
  Administered 2011-08-10 (×3): 50 ug via INTRAVENOUS
  Administered 2011-08-10: 100 ug via INTRAVENOUS
  Administered 2011-08-10: 50 ug via INTRAVENOUS

## 2011-08-10 MED ORDER — NALOXONE HCL 0.4 MG/ML IJ SOLN: 0.4000 mg | INTRAMUSCULAR | Status: DC | PRN

## 2011-08-10 MED ORDER — BISACODYL 10 MG RE SUPP
10.0000 mg | Freq: Every day | RECTAL | Status: DC | PRN
  Administered 2011-08-16: 10 mg via RECTAL
  Filled 2011-08-10: qty 1

## 2011-08-10 MED ORDER — RIVAROXABAN 10 MG PO TABS
10.0000 mg | ORAL_TABLET | Freq: Every day | ORAL | Status: DC
  Administered 2011-08-11 – 2011-08-12 (×2): 10 mg via ORAL
  Filled 2011-08-10 (×3): qty 1

## 2011-08-10 SURGICAL SUPPLY — 54 items
BAG SPEC THK2 15X12 ZIP CLS (MISCELLANEOUS) ×1
BAG ZIPLOCK 12X15 (MISCELLANEOUS) ×2 IMPLANT
BANDAGE ELASTIC 6 VELCRO ST LF (GAUZE/BANDAGES/DRESSINGS) ×2 IMPLANT
BANDAGE ESMARK 6X9 LF (GAUZE/BANDAGES/DRESSINGS) ×1 IMPLANT
BLADE SAG 18X100X1.27 (BLADE) ×2 IMPLANT
BLADE SAW SGTL 11.0X1.19X90.0M (BLADE) ×2 IMPLANT
BNDG CMPR 9X6 STRL LF SNTH (GAUZE/BANDAGES/DRESSINGS) ×1
BNDG ESMARK 6X9 LF (GAUZE/BANDAGES/DRESSINGS) ×2
BOWL SMART MIX CTS (DISPOSABLE) ×2 IMPLANT
CATH KIT ON-Q SILVERSOAK 5 (CATHETERS) ×1 IMPLANT
CATH KIT ON-Q SILVERSOAK 5IN (CATHETERS) ×2 IMPLANT
CEMENT HV SMART SET (Cement) ×4 IMPLANT
CLOTH BEACON ORANGE TIMEOUT ST (SAFETY) ×2 IMPLANT
CUFF TOURN SGL QUICK 34 (TOURNIQUET CUFF) ×2
CUFF TRNQT CYL 34X4X40X1 (TOURNIQUET CUFF) ×1 IMPLANT
DRAPE EXTREMITY T 121X128X90 (DRAPE) ×2 IMPLANT
DRAPE POUCH INSTRU U-SHP 10X18 (DRAPES) ×2 IMPLANT
DRAPE U-SHAPE 47X51 STRL (DRAPES) ×2 IMPLANT
DRSG ADAPTIC 3X8 NADH LF (GAUZE/BANDAGES/DRESSINGS) ×2 IMPLANT
DRSG PAD ABDOMINAL 8X10 ST (GAUZE/BANDAGES/DRESSINGS) ×1 IMPLANT
DURAPREP 26ML APPLICATOR (WOUND CARE) ×2 IMPLANT
ELECT REM PT RETURN 9FT ADLT (ELECTROSURGICAL) ×2
ELECTRODE REM PT RTRN 9FT ADLT (ELECTROSURGICAL) ×1 IMPLANT
EVACUATOR 1/8 PVC DRAIN (DRAIN) ×2 IMPLANT
FACESHIELD LNG OPTICON STERILE (SAFETY) ×10 IMPLANT
GLOVE BIO SURGEON STRL SZ7.5 (GLOVE) ×2 IMPLANT
GLOVE BIO SURGEON STRL SZ8 (GLOVE) ×2 IMPLANT
GLOVE BIOGEL PI IND STRL 8 (GLOVE) ×2 IMPLANT
GLOVE BIOGEL PI INDICATOR 8 (GLOVE) ×2
GOWN STRL NON-REIN LRG LVL3 (GOWN DISPOSABLE) ×2 IMPLANT
GOWN STRL REIN XL XLG (GOWN DISPOSABLE) ×2 IMPLANT
HANDPIECE INTERPULSE COAX TIP (DISPOSABLE) ×2
IMMOBILIZER KNEE 20 (SOFTGOODS) ×2
IMMOBILIZER KNEE 20 THIGH 36 (SOFTGOODS) ×1 IMPLANT
KIT BASIN OR (CUSTOM PROCEDURE TRAY) ×2 IMPLANT
MANIFOLD NEPTUNE II (INSTRUMENTS) ×2 IMPLANT
NS IRRIG 1000ML POUR BTL (IV SOLUTION) ×2 IMPLANT
PACK TOTAL JOINT (CUSTOM PROCEDURE TRAY) ×2 IMPLANT
PAD ABD 7.5X8 STRL (GAUZE/BANDAGES/DRESSINGS) ×2 IMPLANT
PADDING CAST COTTON 6X4 STRL (CAST SUPPLIES) ×4 IMPLANT
POSITIONER SURGICAL ARM (MISCELLANEOUS) ×2 IMPLANT
SET HNDPC FAN SPRY TIP SCT (DISPOSABLE) ×1 IMPLANT
SPONGE GAUZE 4X4 12PLY (GAUZE/BANDAGES/DRESSINGS) ×2 IMPLANT
STRIP CLOSURE SKIN 1/2X4 (GAUZE/BANDAGES/DRESSINGS) ×3 IMPLANT
SUCTION FRAZIER 12FR DISP (SUCTIONS) ×2 IMPLANT
SUT MNCRL AB 4-0 PS2 18 (SUTURE) ×2 IMPLANT
SUT PDS AB 1 CT1 27 (SUTURE) ×6 IMPLANT
SUT VIC AB 2-0 CT1 27 (SUTURE) ×6
SUT VIC AB 2-0 CT1 TAPERPNT 27 (SUTURE) ×3 IMPLANT
SUT VLOC 180 0 24IN GS25 (SUTURE) ×2 IMPLANT
TOWEL OR 17X26 10 PK STRL BLUE (TOWEL DISPOSABLE) ×4 IMPLANT
TRAY FOLEY CATH 14FRSI W/METER (CATHETERS) ×2 IMPLANT
WATER STERILE IRR 1500ML POUR (IV SOLUTION) ×2 IMPLANT
WRAP KNEE MAXI GEL POST OP (GAUZE/BANDAGES/DRESSINGS) ×4 IMPLANT

## 2011-08-10 NOTE — Interval H&P Note (Signed)
History and Physical Interval Note:  08/10/2011 11:41 AM  [name] [name]  has presented today for surgery, with the diagnosis of Osteoarthrits of the Left knee  The various methods of treatment have been discussed with the patient and family. After consideration of risks, benefits and other options for treatment, the patient has consented to  Procedure(s) (LRB): TOTAL KNEE ARTHROPLASTY (Left) as a surgical intervention .  The patients' history has been reviewed, patient examined, no change in status, stable for surgery.  I have reviewed the patients' chart and labs.  Questions were answered to the patient's satisfaction.     Loanne Drilling

## 2011-08-10 NOTE — Op Note (Signed)
Pre-operative diagnosis- Osteoarthritis  Left knee(s)  Post-operative diagnosis- Osteoarthritis Left knee(s)  Procedure-  Left  Total Knee Arthroplasty  Surgeon- Gus Rankin. Dequincy Born, MD  Assistant- Avel Peace, Pa-C   Anesthesia-  General EBL-* No blood loss amount entered *  Drains Hemovac  Tourniquet time-  Total Tourniquet Time Documented: Thigh (Left) - 32 minutes   Complications- None  Condition-PACU - hemodynamically stable.   Brief Clinical Note  [name] [name] is a 78 y.o. year old female with end stage OA of her left knee with progressively worsening pain and dysfunction. She has constant pain, with activity and at rest and significant functional deficits with difficulties even with ADLs. She has had extensive non-op management including analgesics, injections of cortisone and viscosupplements, and home exercise program, but remains in significant pain with significant dysfunction. Radiographs show bone on bone arthritis all 3 compartments with varus deformity and tibial subluxation. She presents now for left Total Knee Arthroplasty.    Procedure in detail---   The patient is brought into the operating room and positioned supine on the operating table. After successful administration of  General,   a tourniquet is placed high on the  Left thigh(s) and the lower extremity is prepped and draped in the usual sterile fashion. Time out is performed by the operating team and then the  Left lower extremity is wrapped in Esmarch, knee flexed and the tourniquet inflated to 300 mmHg.       A midline incision is made with a ten blade through the subcutaneous tissue to the level of the extensor mechanism. A fresh blade is used to make a medial parapatellar arthrotomy. Soft tissue over the proximal medial tibia is subperiosteally elevated to the joint line with a knife and into the semimembranosus bursa with a Cobb elevator. Soft tissue over the proximal lateral tibia is elevated with attention  being paid to avoiding the patellar tendon on the tibial tubercle. The patella is everted, knee flexed 90 degrees and the ACL and PCL are removed. Findings are bone on bone all 3 compartments with large osteophyte formation.        The drill is used to create a starting hole in the distal femur and the canal is thoroughly irrigated with sterile saline to remove the fatty contents. The 5 degree Left  valgus alignment guide is placed into the femoral canal and the distal femoral cutting block is pinned to remove 10 mm off the distal femur. Resection is made with an oscillating saw.      The tibia is subluxed forward and the menisci are removed. The extramedullary alignment guide is placed referencing proximally at the medial aspect of the tibial tubercle and distally along the second metatarsal axis and tibial crest. The block is pinned to remove 2mm off the more deficient medial  side. Resection is made with an oscillating saw. Size 3is the most appropriate size for the tibia and the proximal tibia is prepared with the modular drill and keel punch for that size.      The femoral sizing guide is placed and size 3 is most appropriate. Rotation is marked off the epicondylar axis and confirmed by creating a rectangular flexion gap at 90 degrees. The size 3 cutting block is pinned in this rotation and the anterior, posterior and chamfer cuts are made with the oscillating saw. The intercondylar block is then placed and that cut is made.      Trial size 3 tibial component, trial size 3 posterior  stabilized femur and a 10  mm posterior stabilized rotating platform insert trial is placed. Full extension is achieved with excellent varus/valgus and anterior/posterior balance throughout full range of motion. The patella is everted and thickness measured to be 24  mm. Free hand resection is taken to 14 mm, a 38 template is placed, lug holes are drilled, trial patella is placed, and it tracks normally. Osteophytes are removed  off the posterior femur with the trial in place. All trials are removed and the cut bone surfaces prepared with pulsatile lavage. Cement is mixed and once ready for implantation, the size 3 tibial implant, size  3 posterior stabilized femoral component, and the size 38 patella are cemented in place and the patella is held with the clamp. The trial insert is placed and the knee held in full extension. All extruded cement is removed and once the cement is hard the permanent 10 mm posterior stabilized rotating platform insert is placed into the tibial tray.      The wound is copiously irrigated with saline solution and the extensor mechanism closed over a hemovac drain with #1 PDS suture. The tourniquet is released for a total tourniquet time of 32  minutes. Flexion against gravity is 140 degrees and the patella tracks normally. Subcutaneous tissue is closed with 2.0 vicryl and subcuticular with running 4.0 Monocryl. The catheter for the Marcaine pain pump is placed and the pump is initiated. The incision is cleaned and dried and steri-strips and a bulky sterile dressing are applied. The limb is placed into a knee immobilizer and the patient is awakened and transported to recovery in stable condition.      Please note that a surgical assistant was a medical necessity for this procedure in order to perform it in a safe and expeditious manner. Surgical assistant was necessary to retract the ligaments and vital neurovascular structures to prevent injury to them and also necessary for proper positioning of the limb to allow for anatomic placement of the prosthesis.   Gus Rankin Cici Rodriges, MD    08/10/2011, 1:28 PM

## 2011-08-10 NOTE — Preoperative (Signed)
Beta Blockers   Reason not to administer Beta Blockers:Not Applicable 

## 2011-08-10 NOTE — Anesthesia Postprocedure Evaluation (Signed)
  Anesthesia Post-op Note  Patient: [name] [name]  Procedure(s) Performed: Procedure(s) (LRB): TOTAL KNEE ARTHROPLASTY (Left)  Patient Location: PACU  Anesthesia Type: General  Level of Consciousness: oriented and sedated  Airway and Oxygen Therapy: Patient Spontanous Breathing and Patient connected to nasal cannula oxygen  Post-op Pain: mild  Post-op Assessment: Post-op Vital signs reviewed, Patient's Cardiovascular Status Stable, Respiratory Function Stable and Patent Airway  Post-op Vital Signs: stable  Complications: No apparent anesthesia complications

## 2011-08-10 NOTE — Transfer of Care (Signed)
Immediate Anesthesia Transfer of Care Note  Patient: [name] [name]  Procedure(s) Performed: Procedure(s) (LRB): TOTAL KNEE ARTHROPLASTY (Left)  Patient Location: PACU  Anesthesia Type: General  Level of Consciousness: pateint uncooperative and confused  Airway & Oxygen Therapy: Patient Spontanous Breathing and Patient connected to face mask oxygen  Post-op Assessment: Report given to PACU RN, Post -op Vital signs reviewed and stable and Patient moving all extremities  Post vital signs: Reviewed and stable  Complications: No apparent anesthesia complications

## 2011-08-10 NOTE — Anesthesia Procedure Notes (Addendum)
Performed by: Edison Pace   Procedure Name: Intubation Date/Time: 08/10/2011 12:30 PM Performed by: Edison Pace Pre-anesthesia Checklist: Patient identified, Timeout performed, Emergency Drugs available, Suction available and Patient being monitored Patient Re-evaluated:Patient Re-evaluated prior to inductionOxygen Delivery Method: Circle system utilized Preoxygenation: Pre-oxygenation with 100% oxygen Intubation Type: IV induction Ventilation: Mask ventilation without difficulty Grade View: Grade II Tube type: Oral Airway Equipment and Method: Stylet Placement Confirmation: positive ETCO2,  ETT inserted through vocal cords under direct vision and breath sounds checked- equal and bilateral Future Recommendations: Recommend- induction with short-acting agent, and alternative techniques readily available

## 2011-08-10 NOTE — Anesthesia Preprocedure Evaluation (Signed)
Anesthesia Evaluation  Patient identified by MRN, date of birth, ID band Patient awake  General Assessment Comment:Advanced yrs  Reviewed: Allergy & Precautions, H&P , NPO status , Patient's Chart, lab work & pertinent test results, reviewed documented beta blocker date and time   Airway Mallampati: II TM Distance: >3 FB Neck ROM: Full    Dental  (+) Dental Advisory Given   Pulmonary  S/p thoracotomy for lung cyst-left breath sounds clear to auscultation        Cardiovascular hypertension, + dysrhythmias Atrial Fibrillation Rhythm:Regular Rate:Normal  Denies cardiac symptom HTN on chart, pt denies   Neuro/Psych negative neurological ROS  negative psych ROS   GI/Hepatic negative GI ROS, Neg liver ROS,   Endo/Other  Hypothyroidism   Renal/GU negative Renal ROS  negative genitourinary   Musculoskeletal   Abdominal   Peds negative pediatric ROS (+)  Hematology negative hematology ROS (+)   Anesthesia Other Findings Caps on side  Reproductive/Obstetrics negative OB ROS                           Anesthesia Physical Anesthesia Plan  ASA: III  Anesthesia Plan: General   Post-op Pain Management:    Induction: Intravenous  Airway Management Planned: Oral ETT  Additional Equipment:   Intra-op Plan:   Post-operative Plan: Extubation in OR  Informed Consent: I have reviewed the patients History and Physical, chart, labs and discussed the procedure including the risks, benefits and alternatives for the proposed anesthesia with the patient or authorized representative who has indicated his/her understanding and acceptance.   Dental advisory given  Plan Discussed with: CRNA and Surgeon  Anesthesia Plan Comments:         Anesthesia Quick Evaluation

## 2011-08-11 LAB — CBC
HCT: 29.3 % — ABNORMAL LOW (ref 36.0–46.0)
MCV: 91.8 fL (ref 78.0–100.0)
Platelets: 168 10*3/uL (ref 150–400)
RBC: 3.19 MIL/uL — ABNORMAL LOW (ref 3.87–5.11)
RDW: 15.1 % (ref 11.5–15.5)
WBC: 21.8 10*3/uL — ABNORMAL HIGH (ref 4.0–10.5)

## 2011-08-11 LAB — BASIC METABOLIC PANEL
CO2: 26 mEq/L (ref 19–32)
Chloride: 102 mEq/L (ref 96–112)
Creatinine, Ser: 0.78 mg/dL (ref 0.50–1.10)
GFR calc Af Amer: >90 mL/min (ref >90)
Potassium: 4.1 mEq/L (ref 3.5–5.1)

## 2011-08-11 MED ORDER — ESOMEPRAZOLE MAGNESIUM 40 MG PO CPDR
40.0000 mg | DELAYED_RELEASE_CAPSULE | Freq: Every day | ORAL | Status: DC
  Administered 2011-08-11 – 2011-08-13 (×3): 40 mg via ORAL
  Filled 2011-08-11 (×4): qty 1

## 2011-08-11 MED ORDER — HYDROMORPHONE HCL PF 1 MG/ML IJ SOLN: 0.5000 mg | INTRAMUSCULAR | Status: DC | PRN

## 2011-08-11 MED ORDER — FUROSEMIDE 10 MG/ML IJ SOLN
10.0000 mg | Freq: Once | INTRAMUSCULAR | Status: AC
  Administered 2011-08-11: 10 mg via INTRAVENOUS
  Filled 2011-08-11: qty 1

## 2011-08-11 MED ORDER — NON FORMULARY: 40.0000 mg | Freq: Every day | Status: DC

## 2011-08-11 MED ORDER — SODIUM CHLORIDE 0.9 % IV BOLUS (SEPSIS): 500.0000 mL | Freq: Once | INTRAVENOUS | Status: DC

## 2011-08-11 NOTE — Progress Notes (Signed)
Physical Therapy Treatment Patient Details Name: [name] [name] MRN: [id number] DOB: November 02, 1930 Today's Date: 08/11/2011 Time: 4098-1191 PT Time Calculation (min): 29 min  PT Assessment / Plan / Recommendation Comments on Treatment Session  Pt making slow progression with mobility and continues to require +2 assist for all mobility.  Pts cognition slightly improved in pm with pt more oriented to being in hospital.      Follow Up Recommendations  Skilled nursing facility    Barriers to Discharge Decreased caregiver support      Equipment Recommendations  Defer to next venue    Recommendations for Other Services OT consult  Frequency 7X/week   Plan Discharge plan remains appropriate    Precautions / Restrictions Precautions Precautions: Knee Required Braces or Orthoses: Knee Immobilizer - Left Knee Immobilizer - Left: Discontinue once straight leg raise with < 10 degree lag Restrictions Weight Bearing Restrictions: No Other Position/Activity Restrictions: WBAT   Pertinent Vitals/Pain Painful but pt motivated to perform mobility.     Mobility  Bed Mobility Bed Mobility: Sit to Supine Supine to Sit: 1: +2 Total assist;HOB elevated Supine to Sit: Patient Percentage: 40% Sitting - Scoot to Edge of Bed: 1: +2 Total assist Sitting - Scoot to Edge of Bed: Patient Percentage: 50% Sit to Supine: 1: +2 Total assist Sit to Supine: Patient Percentage: 30% Details for Bed Mobility Assistance: Assist for B LE into bed and for controlled descent of trunk with cues for hand placement to self assist.  Transfers Transfers: Sit to Stand;Stand to Sit Sit to Stand: 1: +2 Total assist;With upper extremity assist;With armrests;From chair/3-in-1 Sit to Stand: Patient Percentage: 40% Stand to Sit: 1: +2 Total assist;With upper extremity assist;To elevated surface;To bed Stand to Sit: Patient Percentage: 40% Details for Transfer Assistance: Continues to require assist for rising and to maintain  upright position initially, cues for hand placement and technique.  Ambulation/Gait Ambulation/Gait Assistance: 1: +2 Total assist Ambulation/Gait: Patient Percentage: 30% Ambulation Distance (Feet): 5 Feet Assistive device: Rolling walker Ambulation/Gait Assistance Details: Mulimodal cues for sequencing/technique with RW and increasing L step length, upright stance and safety.  Noted slightly improved steps during pm.  Gait Pattern: Step-to pattern;Decreased stride length;Trunk flexed;Decreased step length - left;Decreased stance time - right Gait velocity: decreased    Exercises Total Joint Exercises Ankle Circles/Pumps: AROM;Both;20 reps Quad Sets: AROM;Left;10 reps Heel Slides: AAROM;Left;10 reps Hip ABduction/ADduction: AAROM;Left;10 reps Straight Leg Raises: AAROM;Left;10 reps   PT Diagnosis: Difficulty walking;Abnormality of gait;Generalized weakness;Acute pain  PT Problem List: Decreased strength;Decreased range of motion;Decreased activity tolerance;Decreased balance;Decreased mobility;Decreased coordination;Decreased knowledge of use of DME;Decreased cognition;Decreased knowledge of precautions;Pain PT Treatment Interventions: DME instruction;Gait training;Functional mobility training;Therapeutic activities;Therapeutic exercise;Balance training;Patient/family education   PT Goals Acute Rehab PT Goals PT Goal Formulation: With patient Time For Goal Achievement: 08/16/11 Potential to Achieve Goals: Fair Pt will go Supine/Side to Sit: with min assist PT Goal: Supine/Side to Sit - Progress: Goal set today Pt will go Sit to Supine/Side: with min assist PT Goal: Sit to Supine/Side - Progress: Progressing toward goal Pt will go Sit to Stand: with mod assist PT Goal: Sit to Stand - Progress: Progressing toward goal Pt will go Stand to Sit: with mod assist PT Goal: Stand to Sit - Progress: Progressing toward goal Pt will Transfer Bed to Chair/Chair to Bed: with mod assist PT  Transfer Goal: Bed to Chair/Chair to Bed - Progress: Progressing toward goal Pt will Ambulate: 16 - 50 feet;with mod assist;with least restrictive assistive device PT Goal:  Ambulate - Progress: Goal set today  Visit Information  Last PT Received On: 08/11/11 Assistance Needed: +2    Subjective Data  Subjective: I hurt so bad Patient Stated Goal: I want to return home.    Cognition  Overall Cognitive Status: Appears within functional limits for tasks assessed/performed Arousal/Alertness: Lethargic Orientation Level: Appears intact for tasks assessed Behavior During Session: [facility] for tasks performed Cognition - Other Comments: Pts cognition slightly improved this afternoon and more oriented to being in hospital.     Balance     End of Session PT - End of Session Equipment Utilized During Treatment: Gait belt;Left knee immobilizer Activity Tolerance: Patient limited by pain Patient left: in bed;with call bell/phone within reach Nurse Communication: Mobility status CPM Left Knee CPM Left Knee: On    Lessie Dings 08/11/2011, 4:54 PM

## 2011-08-11 NOTE — Progress Notes (Signed)
Subjective: 1 Day Post-Op Procedure(s) (LRB): TOTAL KNEE ARTHROPLASTY (Left) Patient reports pain as mild.   Patient seen in rounds with Dr. Lequita Halt. Patient is well, and has had no acute complaints or problems We will start therapy today.  Plan is to go Skilled nursing facility after hospital stay - [facility]  Objective: Vital signs in last 24 hours: Temp:  [97.4 F (36.3 C)-99.6 F (37.6 C)] 99.1 F (37.3 C) (05/16 0941) Pulse Rate:  [63-99] 69  (05/16 0941) Resp:  [13-21] 16  (05/16 1102) BP: (85-162)/(49-115) 85/49 mmHg (05/16 0941) SpO2:  [91 %-100 %] 94 % (05/16 1102) Weight:  [75.751 kg (167 lb)] 75.751 kg (167 lb) (05/15 1552)  Intake/Output from previous day:  Intake/Output Summary (Last 24 hours) at 08/11/11 1106 Last data filed at 08/11/11 1023  Gross per 24 hour  Intake   3520 ml  Output    836 ml  Net   2684 ml    Intake/Output this shift: Total I/O In: 973.8 [P.O.:120; I.V.:853.8] Out: - 200 since MN (BP is soft)  Labs:  [facility] 08/11/11 0432  HGB 9.4*    Basename 08/11/11 0432  WBC 21.8*  RBC 3.19*  HCT 29.3*  PLT 168    Basename 08/11/11 0432  NA 136  K 4.1  CL 102  CO2 26  BUN 10  CREATININE 0.78  GLUCOSE 138*  CALCIUM 8.5   No results found for this basename: LABPT:2,INR:2 in the last 72 hours  EXAM General - Patient is Alert, Appropriate and Oriented Extremity - Neurovascular intact Sensation intact distally Dressing - dressing C/D/I Motor Function - intact, moving foot and toes well on exam.  Hemovac pulled without difficulty.  Past Medical History  Diagnosis Date  . Other and unspecified hyperlipidemia   . Carotid artery disease     50-69% stenosis R.ICA,12.11        4/13 carotid dopplar EPIC  . Hypothyroidism   . Anemia   . GERD (gastroesophageal reflux disease)   . Arthritis   . Atrial fibrillation 4/07    EKG, Clearance and note, LOV Dr Andee Lineman EPIC  . Shortness of breath 4/07    with   walking long distances since removal bronchogenic cyst- states no changes  . Unspecified essential hypertension     Assessment/Plan: 1 Day Post-Op Procedure(s) (LRB): TOTAL KNEE ARTHROPLASTY (Left) Principal Problem:  *OA (osteoarthritis) of knee   Advance diet Up with therapy Discharge to SNF  DVT Prophylaxis - Xarelto Weight-Bearing as tolerated to left leg Keep foley until tomorrow. No vaccines. D/C PCA Dilaudid, Change to IV push D/C O2 and Pulse OX and try on Room Air Blood Pressure is soft - Give extra fluids and monitor output closely.  Cheyenna Pankowski 08/11/2011, 11:06 AM

## 2011-08-11 NOTE — Progress Notes (Signed)
Clinical Social Work Department BRIEF PSYCHOSOCIAL ASSESSMENT  08/11/2011   Patient:  [name] [name], [name] [name]     Account Number:  [id number]     Admit date:  08/10/2011  Clinical Social Worker:  Candie Chroman  Date/Time:  08/11/2011 01:06 AM  Referred by:  Physician  Date Referred:  08/11/2011 Referred for  SNF Placement   Other Referral:   Interview type:  Patient Other interview type:    PSYCHOSOCIAL DATA Living Status:  ALONE Admitted from facility:   Level of care:   Primary support name:  [name] [name] Primary support relationship to patient:  CHILD, ADULT Degree of support available:    CURRENT CONCERNS Current Concerns  Post-Acute Placement   Other Concerns:    SOCIAL WORK ASSESSMENT / PLAN Pt is a 78 yr old female living at home prior to hospitalization. Met with pt to assist with d/c planning. Pt requesting ST SNF placement following hospitalization. SNF search has been initiated . CSW will follow to assist wit d/c planning to SNF.   Assessment/plan status:  Psychosocial Support/Ongoing Assessment of Needs Other assessment/ plan:   Information/referral to community resources:    PATIENT'S/FAMILY'S RESPONSE TO PLAN OF CARE: Pt hopes to go to [facility] upon d/c.    Cori Razor LCSW [phone]

## 2011-08-11 NOTE — Plan of Care (Signed)
Problem: Consults Goal: Diagnosis- Total Joint Replacement Outcome: Completed/Met Date Met:  08/11/11 Primary Total Knee LEFT  Problem: Phase I Progression Outcomes Goal: [place] evening of surgery Outcome: Not Met (add Reason) Increased nausea and pain, pt refuses.

## 2011-08-11 NOTE — Progress Notes (Signed)
Clinical Social Work Department CLINICAL SOCIAL WORK PLACEMENT NOTE 08/11/2011  Patient:  [name] [name], [name] [name]  Account Number:  [id number] Admit date:  08/10/2011  Clinical Social Worker:  Cori Razor, LCSW  Date/time:  08/11/2011 01:26 AM  Clinical Social Work is seeking post-discharge placement for this patient at the following level of care:   SKILLED NURSING   (*CSW will update this form in Epic as items are completed)   08/11/2011  Patient/family provided with [facility] Department of Clinical Social Work's list of facilities offering this level of care within the geographic area requested by the patient (or if unable, by the patient's family).  08/11/2011  Patient/family informed of their freedom to choose among providers that offer the needed level of care, that participate in Medicare, Medicaid or managed care program needed by the patient, have an available bed and are willing to accept the patient.  08/11/2011  Patient/family informed of [facility]' ownership interest in [facility], as well as of the fact that they are under no obligation to receive care at this facility.  PASARR submitted to EDS on 08/11/2011 PASARR number received from EDS on   FL2 transmitted to all facilities in geographic area requested by pt/family on  08/11/2011 FL2 transmitted to all facilities within larger geographic area on   Patient informed that his/her managed care company has contracts with or will negotiate with  certain facilities, including the following:     Patient/family informed of bed offers received:   Patient chooses bed at  Physician recommends and patient chooses bed at    Patient to be transferred to  on   Patient to be transferred to facility by   The following physician request were entered in Epic:   Additional Comments:  Cori Razor  LCSW  [phone]

## 2011-08-11 NOTE — Evaluation (Signed)
Physical Therapy Evaluation Patient Details Name: [name] [name] MRN: [id number] DOB: [birth year] Today's Date: 08/11/2011 Time: 4098-1191 PT Time Calculation (min): 32 min  PT Assessment / Plan / Recommendation Clinical Impression  Pt presents s/p L TKA POD 1 with decreased strength and ROM in LLE and decreased mobility.  Tolerated some steps from bed to chair, however is slightly confused with delayed response to therapists commands.  Pt will benefit from skilled PT in acute venue to address deficits.  PT recommends SNF for follow up therapy at D/C to increase pt safety and decrease burden of care.      PT Assessment  Patient needs continued PT services    Follow Up Recommendations  Skilled nursing facility    Barriers to Discharge Decreased caregiver support      lEquipment Recommendations  Defer to next venue    Recommendations for Other Services OT consult   Frequency 7X/week    Precautions / Restrictions Precautions Precautions: Knee Required Braces or Orthoses: Knee Immobilizer - Left Knee Immobilizer - Left: Discontinue once straight leg raise with < 10 degree lag Restrictions Weight Bearing Restrictions: No Other Position/Activity Restrictions: WBAT   Pertinent Vitals/Pain 10/10      Mobility  Bed Mobility Bed Mobility: Supine to Sit;Sitting - Scoot to Edge of Bed Supine to Sit: 1: +2 Total assist;HOB elevated Supine to Sit: Patient Percentage: 40% Sitting - Scoot to Edge of Bed: 1: +2 Total assist Sitting - Scoot to Edge of Bed: Patient Percentage: 50% Details for Bed Mobility Assistance: Assist for LLE off of bed with some assist for trunk into sitting position with cues for UE placement to self assist trunk.   Transfers Transfers: Sit to Stand;Stand to Sit Sit to Stand: 1: +2 Total assist;From elevated surface;With upper extremity assist;From bed Sit to Stand: Patient Percentage: 30% Stand to Sit: 1: +2 Total assist;With upper extremity assist;With armrests;To  chair/3-in-1 Stand to Sit: Patient Percentage: 30% Details for Transfer Assistance: Requires assist for attaining upright position with multimodal cuing for hand placement and LE management.   Ambulation/Gait Ambulation/Gait Assistance: 1: +2 Total assist Ambulation/Gait: Patient Percentage: 30% Ambulation Distance (Feet): 5 Feet Assistive device: Rolling walker Ambulation/Gait Assistance Details: Mulimodal cues for sequencing/technique with RW and increasing L step length, upright stance and safety.   Gait Pattern: Step-to pattern;Decreased stride length;Trunk flexed;Decreased step length - left;Decreased stance time - right Gait velocity: decreased    Exercises     PT Diagnosis: Difficulty walking;Abnormality of gait;Generalized weakness;Acute pain  PT Problem List: Decreased strength;Decreased range of motion;Decreased activity tolerance;Decreased balance;Decreased mobility;Decreased coordination;Decreased knowledge of use of DME;Decreased cognition;Decreased knowledge of precautions;Pain PT Treatment Interventions: DME instruction;Gait training;Functional mobility training;Therapeutic activities;Therapeutic exercise;Balance training;Patient/family education   PT Goals Acute Rehab PT Goals PT Goal Formulation: With patient Time For Goal Achievement: 08/16/11 Potential to Achieve Goals: Fair Pt will go Supine/Side to Sit: with min assist PT Goal: Supine/Side to Sit - Progress: Goal set today Pt will go Sit to Supine/Side: with min assist PT Goal: Sit to Supine/Side - Progress: Goal set today Pt will go Sit to Stand: with mod assist PT Goal: Sit to Stand - Progress: Goal set today Pt will go Stand to Sit: with mod assist PT Goal: Stand to Sit - Progress: Goal set today Pt will Transfer Bed to Chair/Chair to Bed: with mod assist PT Transfer Goal: Bed to Chair/Chair to Bed - Progress: Goal set today Pt will Ambulate: 16 - 50 feet;with mod assist;with least restrictive assistive  device  PT Goal: Ambulate - Progress: Goal set today  Visit Information  Last PT Received On: 08/11/11 Assistance Needed: +2    Subjective Data  Subjective: My legs hurts a lot  Patient Stated Goal: I want to return home.    Prior Functioning  Home Living Lives With: Alone (son lives across the street) Available Help at Discharge: Family Type of Home: House Home Access: Stairs to enter Secretary/administrator of Steps: 1 Home Layout: Able to live on main level with bedroom/bathroom;Two level Bathroom Shower/Tub: Health visitor: Standard Home Adaptive Equipment: Grab bars in shower;Bedside commode/3-in-1;Walker - rolling;Straight cane Prior Function Level of Independence: Independent Able to Take Stairs?: Yes Driving: Yes Vocation: Retired Musician: No difficulties    Cognition  Overall Cognitive Status: Appears within functional limits for tasks assessed/performed Arousal/Alertness: Lethargic Orientation Level: Appears intact for tasks assessed Behavior During Session: [facility] for tasks performed Cognition - Other Comments: Pt able to follow commands when asked, however she would return to the thought that she was at home, then state "oh yeah I'm at the hospital"    Extremity/Trunk Assessment Right Lower Extremity Assessment RLE ROM/Strength/Tone: [facility] for tasks assessed RLE Sensation: WFL - Light Touch RLE Coordination: WFL - gross motor Left Lower Extremity Assessment LLE ROM/Strength/Tone: Deficits LLE ROM/Strength/Tone Deficits: Unable to perform SLR, ankle motions WFL, unable to fully test due to pain.  LLE Sensation: WFL - Light Touch LLE Coordination: WFL - gross motor Trunk Assessment Trunk Assessment: Kyphotic   Balance    End of Session PT - End of Session Equipment Utilized During Treatment: Gait belt;Left knee immobilizer Activity Tolerance: Patient limited by fatigue;Other (comment) (Pt limited by nausea) Patient left: in  chair;with call bell/phone within reach;with family/visitor present Nurse Communication: Mobility status   Page, Meribeth Mattes 08/11/2011, 2:37 PM

## 2011-08-11 NOTE — Progress Notes (Signed)
CARE MANAGEMENT NOTE 08/11/2011  Patient:  [name] [name], [name] [name]   Account Number:  [id number]  Date Initiated:  08/11/2011  Documentation initiated by:  Colleen Can  Subjective/Objective Assessment:   dx osteoarthritis left knee; total knee replacemnt     Action/Plan:   Cm spoke with patient. Plans is requesting skilled facility rehab   Anticipated DC Date:  08/13/2011   Anticipated DC Plan:  SKILLED NURSING FACILITY  In-house referral  Clinical Social Worker      DC Planning Services  CM consult      [facility] Choice  NA   Choice offered to / List presented to:  NA   DME arranged  NA      DME agency  NA     HH arranged  NA      HH agency  NA   Status of service:  Completed, signed off

## 2011-08-12 ENCOUNTER — Inpatient Hospital Stay (HOSPITAL_COMMUNITY): Payer: Medicare Other

## 2011-08-12 ENCOUNTER — Encounter (HOSPITAL_COMMUNITY): Payer: Self-pay | Admitting: Orthopedic Surgery

## 2011-08-12 DIAGNOSIS — Z9289 Personal history of other medical treatment: Secondary | ICD-10-CM

## 2011-08-12 DIAGNOSIS — R112 Nausea with vomiting, unspecified: Secondary | ICD-10-CM

## 2011-08-12 DIAGNOSIS — I471 Supraventricular tachycardia: Secondary | ICD-10-CM

## 2011-08-12 DIAGNOSIS — E871 Hypo-osmolality and hyponatremia: Secondary | ICD-10-CM | POA: Diagnosis not present

## 2011-08-12 DIAGNOSIS — I4891 Unspecified atrial fibrillation: Secondary | ICD-10-CM

## 2011-08-12 DIAGNOSIS — I10 Essential (primary) hypertension: Secondary | ICD-10-CM

## 2011-08-12 DIAGNOSIS — E8771 Transfusion associated circulatory overload: Secondary | ICD-10-CM

## 2011-08-12 DIAGNOSIS — D62 Acute posthemorrhagic anemia: Secondary | ICD-10-CM

## 2011-08-12 LAB — BASIC METABOLIC PANEL
Calcium: 8.1 mg/dL — ABNORMAL LOW (ref 8.4–10.5)
GFR calc Af Amer: >90 mL/min (ref >90)
GFR calc non Af Amer: 78 mL/min — ABNORMAL LOW (ref >90)
Glucose, Bld: 160 mg/dL — ABNORMAL HIGH (ref 70–99)
Sodium: 132 mEq/L — ABNORMAL LOW (ref 135–145)

## 2011-08-12 LAB — CBC
Hemoglobin: 7.6 g/dL — ABNORMAL LOW (ref 12.0–15.0)
MCH: 29.7 pg (ref 26.0–34.0)
MCHC: 33 g/dL (ref 30.0–36.0)
Platelets: 112 10*3/uL — ABNORMAL LOW (ref 150–400)
RDW: 15.4 % (ref 11.5–15.5)

## 2011-08-12 MED ORDER — METOPROLOL TARTRATE 1 MG/ML IV SOLN
5.0000 mg | INTRAVENOUS | Status: AC
  Administered 2011-08-12: 5 mg via INTRAVENOUS

## 2011-08-12 MED ORDER — WARFARIN VIDEO: Freq: Once | Status: DC

## 2011-08-12 MED ORDER — METHOCARBAMOL 100 MG/ML IJ SOLN
500.0000 mg | Freq: Three times a day (TID) | INTRAVENOUS | Status: DC | PRN
  Administered 2011-08-13 – 2011-08-16 (×4): 500 mg via INTRAVENOUS
  Filled 2011-08-12 (×6): qty 5

## 2011-08-12 MED ORDER — METOPROLOL TARTRATE 1 MG/ML IV SOLN
INTRAVENOUS | Status: AC
  Administered 2011-08-12: 5 mg via INTRAVENOUS
  Filled 2011-08-12: qty 5

## 2011-08-12 MED ORDER — WARFARIN - PHARMACIST DOSING INPATIENT: Freq: Every day | Status: DC

## 2011-08-12 MED ORDER — ENOXAPARIN SODIUM 40 MG/0.4ML ~~LOC~~ SOLN
40.0000 mg | SUBCUTANEOUS | Status: DC
  Administered 2011-08-13: 40 mg via SUBCUTANEOUS
  Filled 2011-08-12: qty 0.4

## 2011-08-12 MED ORDER — METHOCARBAMOL 500 MG PO TABS
500.0000 mg | ORAL_TABLET | Freq: Three times a day (TID) | ORAL | Status: DC | PRN
  Administered 2011-08-15: 500 mg via ORAL
  Filled 2011-08-12 (×2): qty 1

## 2011-08-12 MED ORDER — IOHEXOL 300 MG/ML  SOLN
100.0000 mL | Freq: Once | INTRAMUSCULAR | Status: AC | PRN
  Administered 2011-08-12: 100 mL via INTRAVENOUS

## 2011-08-12 MED ORDER — CIPROFLOXACIN IN D5W 400 MG/200ML IV SOLN
400.0000 mg | Freq: Two times a day (BID) | INTRAVENOUS | Status: DC
  Administered 2011-08-13: 400 mg via INTRAVENOUS
  Filled 2011-08-12 (×2): qty 200

## 2011-08-12 MED ORDER — METRONIDAZOLE IN NACL 5-0.79 MG/ML-% IV SOLN
500.0000 mg | Freq: Three times a day (TID) | INTRAVENOUS | Status: DC
  Administered 2011-08-13 (×2): 500 mg via INTRAVENOUS
  Filled 2011-08-12 (×4): qty 100

## 2011-08-12 MED ORDER — PROMETHAZINE HCL 25 MG/ML IJ SOLN
6.2500 mg | Freq: Four times a day (QID) | INTRAMUSCULAR | Status: DC | PRN
  Administered 2011-08-12 (×3): 6.25 mg via INTRAVENOUS
  Filled 2011-08-12 (×3): qty 1

## 2011-08-12 MED ORDER — METOPROLOL TARTRATE 12.5 MG HALF TABLET
12.5000 mg | ORAL_TABLET | Freq: Two times a day (BID) | ORAL | Status: DC
  Administered 2011-08-12 – 2011-08-18 (×11): 12.5 mg via ORAL
  Filled 2011-08-12 (×13): qty 1

## 2011-08-12 MED ORDER — WARFARIN SODIUM 5 MG PO TABS
5.0000 mg | ORAL_TABLET | Freq: Once | ORAL | Status: AC
  Administered 2011-08-13: 5 mg via ORAL
  Filled 2011-08-12 (×2): qty 1

## 2011-08-12 MED ORDER — DEXTROSE 5 % IV SOLN
1.0000 g | INTRAVENOUS | Status: DC
  Administered 2011-08-12: 1 g via INTRAVENOUS
  Filled 2011-08-12 (×2): qty 10

## 2011-08-12 MED ORDER — COUMADIN BOOK
Freq: Once | Status: DC
  Filled 2011-08-12 (×2): qty 1

## 2011-08-12 MED ORDER — DILTIAZEM HCL 100 MG IV SOLR
5.0000 mg/h | INTRAVENOUS | Status: DC
  Administered 2011-08-12: 5 mg/h via INTRAVENOUS
  Administered 2011-08-13 (×3): 15 mg/h via INTRAVENOUS
  Administered 2011-08-14: 5 mg/h via INTRAVENOUS
  Filled 2011-08-12: qty 100

## 2011-08-12 MED ORDER — SODIUM CHLORIDE 0.9 % IV SOLN: INTRAVENOUS | Status: DC

## 2011-08-12 MED ORDER — FUROSEMIDE 10 MG/ML IJ SOLN
80.0000 mg | Freq: Two times a day (BID) | INTRAMUSCULAR | Status: DC
  Administered 2011-08-12: 80 mg via INTRAVENOUS
  Filled 2011-08-12 (×3): qty 8

## 2011-08-12 MED ORDER — FUROSEMIDE 10 MG/ML IJ SOLN
10.0000 mg | Freq: Once | INTRAMUSCULAR | Status: AC
  Administered 2011-08-12: 10 mg via INTRAVENOUS
  Filled 2011-08-12: qty 1

## 2011-08-12 MED ORDER — DILTIAZEM LOAD VIA INFUSION
10.0000 mg | Freq: Once | INTRAVENOUS | Status: AC
  Administered 2011-08-12: 10 mg via INTRAVENOUS
  Filled 2011-08-12: qty 10

## 2011-08-12 MED ORDER — FUROSEMIDE 10 MG/ML IJ SOLN
20.0000 mg | Freq: Once | INTRAMUSCULAR | Status: DC
  Filled 2011-08-12: qty 2

## 2011-08-12 NOTE — Progress Notes (Signed)
PT Cancellation Note  Treatment cancelled today due to medical issues with patient which prohibited therapy.  Pt was receiving 2 units of blood due to HGB 7.6, then per RN, pt states that she is very nauseated, therefore deferred therapy for today.  Will check back with pt tomorrow.    Thanks,   Page, Meribeth Mattes 08/12/2011, 4:49 PM

## 2011-08-12 NOTE — Progress Notes (Signed)
[facility] follow-up progress note (same day)  Reviewed patients CXR and noted new development of pulmonary infiltrates.  She likely could have TACO related to her recent fluid resuscitation of 500 cc yesterday and then 2 U PRBC's as well earlier today.  I will order IV lasix 80 mg bid, first dose now and reassess her clinically.  I will curbside Pulmonology regarding this, although she seems to be satting a little better now.  Pleas Koch, MD [facility] [phone]

## 2011-08-12 NOTE — Progress Notes (Signed)
ANTICOAGULATION/Antibiotic CONSULT NOTE - Initial Consult  Pharmacy Consult for warfarin/prophylactic Lovenox and Cipro/Flagyl Indication: VTE prophylaxis  Allergies  Allergen Reactions  . Morphine   . Penicillins Itching    Patient Measurements: Height: 5' 5.5" (166.4 cm) Weight: 167 lb (75.751 kg) IBW/kg (Calculated) : 58.15   Vital Signs: Temp: 99.5 F (37.5 C) (05/17 2055) Temp src: Oral (05/17 2055) BP: 193/114 mmHg (05/17 2055) Pulse Rate: 144  (05/17 2142)  Labs:  Basename 08/12/11 0355 08/11/11 0432  HGB 7.6* 9.4*  HCT 23.0* 29.3*  PLT 112* 168  APTT -- --  LABPROT -- --  INR -- --  HEPARINUNFRC -- --  CREATININE 0.79 0.78  CKTOTAL -- --  CKMB -- --  TROPONINI -- --    Estimated Creatinine Clearance: 59.7 ml/min (by C-G formula based on Cr of 0.79).   Medical History: Past Medical History  Diagnosis Date  . Other and unspecified hyperlipidemia   . Carotid artery disease     50-69% stenosis R.ICA,12.11        4/13 carotid dopplar EPIC  . Hypothyroidism   . Anemia   . GERD (gastroesophageal reflux disease)   . Arthritis   . Atrial fibrillation 4/07    EKG, Clearance and note, LOV Dr Andee Lineman EPIC  . Shortness of breath 4/07    with  walking long distances since removal bronchogenic cyst- states no changes  . Unspecified essential hypertension     Medications:  Scheduled:    . docusate sodium  100 mg Oral BID  . esomeprazole  40 mg Oral Q1200  . fesoterodine  8 mg Oral Daily  . furosemide  10 mg Intravenous Once  . furosemide  20 mg Intravenous Once  . gabapentin  600 mg Oral QHS  . levothyroxine  175 mcg Oral QAC breakfast  . metoprolol  5 mg Intravenous NOW  . metoprolol tartrate  12.5 mg Oral BID  . simvastatin  20 mg Oral QHS  . sodium chloride  500 mL Intravenous Once  . DISCONTD: cefTRIAXone (ROCEPHIN)  IV  1 g Intravenous Q24H  . DISCONTD: rivaroxaban  10 mg Oral Q breakfast   Infusions:    . sodium chloride    . DISCONTD:  bupivacaine ON-Q pain pump      Assessment:  78 yo female s/p TKA started on Xarelto post op and has had 2 doses including this morning, so Lovenox will not be started until tomorrow when the Xarelto was due  Warfarin to start tonight  Baseline INR 0.98  Patient with low plts currently so will need to monitor closely   Goal of Therapy:  INR 2-3    Plan:  1. Warfarin 5mg  tonight 2. Start Lovenox 40mg  SQ q24 beginning tomorrow AM since patient already had a dose of Xarelto today 3. Warfarin education booklet and video 4. Daily INR 5. Also to start Cipro 400mg  IV q12 and Flagyl 500mg  IV q8 per Md orders   Hessie Knows, PharmD, BCPS Pager [phone] 08/12/2011 10:00 PM

## 2011-08-12 NOTE — Progress Notes (Signed)
Subjective: 2 Days Post-Op Procedure(s) (LRB): TOTAL KNEE ARTHROPLASTY (Left) Patient reports pain as mild and moderate on rounds this morning.  Patient is having problems with nausea/vomiting and pain in the knee, requiring pain medications Plan is to go Skilled nursing facility after hospital stay - [facility].  Objective: Vital signs in last 24 hours: Temp:  [98.9 F (37.2 C)-103.1 F (39.5 C)] 99.5 F (37.5 C) (05/17 2055) Pulse Rate:  [67-174] 174  (05/17 2055) Resp:  [16-24] 22  (05/17 2055) BP: (143-211)/(60-120) 193/114 mmHg (05/17 2055) SpO2:  [89 %-97 %] 89 % (05/17 2055)  Intake/Output from previous day:  Intake/Output Summary (Last 24 hours) at 08/12/11 2122 Last data filed at 08/12/11 1925  Gross per 24 hour  Intake 2226.09 ml  Output   1850 ml  Net 376.09 ml    Intake/Output this shift: Total I/O In: 141.7 [Blood:141.7] Out: -   Labs:  Basename 08/12/11 0355 08/11/11 0432  HGB 7.6* 9.4*    Basename 08/12/11 0355 08/11/11 0432  WBC 22.4* 21.8*  RBC 2.56* 3.19*  HCT 23.0* 29.3*  PLT 112* 168    Basename 08/12/11 0355 08/11/11 0432  NA 132* 136  K 3.9 4.1  CL 100 102  CO2 24 26  BUN 13 10  CREATININE 0.79 0.78  GLUCOSE 160* 138*  CALCIUM 8.1* 8.5   No results found for this basename: LABPT:2,INR:2 in the last 72 hours  EXAM General - Patient is Alert, Appropriate and Oriented Extremity - Neurovascular intact Sensation intact distally Dressing/Incision - clean, dry, no drainage, healing Motor Function - intact, moving foot and toes well on exam.   Past Medical History  Diagnosis Date  . Other and unspecified hyperlipidemia   . Carotid artery disease     50-69% stenosis R.ICA,12.11        4/13 carotid dopplar EPIC  . Hypothyroidism   . Anemia   . GERD (gastroesophageal reflux disease)   . Arthritis   . Atrial fibrillation 4/07    EKG, Clearance and note, LOV Dr Andee Lineman EPIC  . Shortness of breath 4/07    with  walking  long distances since removal bronchogenic cyst- states no changes  . Unspecified essential hypertension     Assessment/Plan: 2 Days Post-Op Procedure(s) (LRB): TOTAL KNEE ARTHROPLASTY (Left) Principal Problem:  *OA (osteoarthritis) of knee Active Problems:  Postop Hyponatremia  Postop Acute blood loss anemia  Postop Transfusion  Elevate temp today Elevated WBC Check CXR - R/O aspiration, infiltrate  Up with therapy Blood today and recheck labs  DVT Prophylaxis - Xarelto Weight-Bearing as tolerated to left leg  Amal Renbarger 08/12/2011, 9:22 PM

## 2011-08-12 NOTE — Care Management Note (Signed)
    Page 1 of 2   08/12/2011     5:38:14 PM   CARE MANAGEMENT NOTE 08/12/2011  Patient:  [name] [name], [name] [name]   Account Number:  [id number]  Date Initiated:  08/11/2011  Documentation initiated by:  Colleen Can  Subjective/Objective Assessment:   dx osteoarthritis left knee; total knee replacemnt     Action/Plan:   Cm spoke with patient. Plans is requesting skilled facility rehab   Anticipated DC Date:  08/13/2011   Anticipated DC Plan:  SKILLED NURSING FACILITY  In-house referral  Clinical Social Worker      DC Planning Services  CM consult      [facility] Choice  NA   Choice offered to / List presented to:  NA   DME arranged  NA      DME agency  NA     HH arranged  NA      HH agency  NA   Status of service:  Completed, signed off Medicare Important Message given?  NA - LOS <3 / Initial given by admissions (If response is "NO", the following Medicare IM given date fields will be blank) Date Medicare IM given:   Date Additional Medicare IM given:    Discharge Disposition:    Per UR Regulation:    If discussed at Long Length of Stay Meetings, dates discussed:    Comments:

## 2011-08-12 NOTE — Progress Notes (Signed)
Pt has felt nauseated and sick to stomach all day, antiemetic not effective, pt had fever through night and into the day.  Pt received 2 units of PRCS. 1st unit patient had no symptoms of blood reactions, 2nd unit of blood pre vitals were 1615 99.7 HR 73, 16 resp, 185/77.15 min vitals while receiving blood were 98.9, hr 74, 16, 189/91. Hourly vitals after were received by NT at  1740 t 99.4, hr 145, resp 18, b/p 211/120, 1840 101.1 HR 141, resp 18, 196/82.  At 1915 Night RN came to address IV beep and received a set a vitals at 1915 t 100.4 HR 152, resp 21 176/99.  Day RN was unaware of HR and had spoke with MD about B/p and gave an order to give lasix 10 IV after blood. MD was called with increased Heart rate and EKG was done, orders received to transfer pt to telemetry. Family was made aware.

## 2011-08-12 NOTE — Progress Notes (Deleted)
ANTIBIOTIC CONSULT NOTE - INITIAL  Pharmacy Consult for Zosyn Indication: ?aspiration PNA  Allergies  Allergen Reactions  . Morphine   . Penicillins Itching    Patient Measurements: Height: 5' 5.5" (166.4 cm) Weight: 167 lb (75.751 kg) IBW/kg (Calculated) : 58.15   Vital Signs: Temp: 99.5 F (37.5 C) (05/17 2055) Temp src: Oral (05/17 2055) BP: 193/114 mmHg (05/17 2055) Pulse Rate: 174  (05/17 2055) Intake/Output from previous day: 05/16 0701 - 05/17 0700 In: 3369 [P.O.:240; I.V.:2325; IV Piggyback:4] Out: 825 [Urine:825] Intake/Output from this shift: Total I/O In: 141.7 [Blood:141.7] Out: -   Labs:  Basename 08/12/11 0355 08/11/11 0432  WBC 22.4* 21.8*  HGB 7.6* 9.4*  PLT 112* 168  LABCREA -- --  CREATININE 0.79 0.78   Estimated Creatinine Clearance: 59.7 ml/min (by C-G formula based on Cr of 0.79). No results found for this basename: VANCOTROUGH:2,VANCOPEAK:2,VANCORANDOM:2,GENTTROUGH:2,GENTPEAK:2,GENTRANDOM:2,TOBRATROUGH:2,TOBRAPEAK:2,TOBRARND:2,AMIKACINPEAK:2,AMIKACINTROU:2,AMIKACIN:2, in the last 72 hours   Microbiology: Recent Results (from the past 720 hour(s))  SURGICAL PCR SCREEN     Status: Normal   Collection Time   08/02/11 12:00 PM      Component Value Range Status Comment   MRSA, PCR NEGATIVE  NEGATIVE  Final    Staphylococcus aureus NEGATIVE  NEGATIVE  Final     Medical History: Past Medical History  Diagnosis Date  . Other and unspecified hyperlipidemia   . Carotid artery disease     50-69% stenosis R.ICA,12.11        4/13 carotid dopplar EPIC  . Hypothyroidism   . Anemia   . GERD (gastroesophageal reflux disease)   . Arthritis   . Atrial fibrillation 4/07    EKG, Clearance and note, LOV Dr Andee Lineman EPIC  . Shortness of breath 4/07    with  walking long distances since removal bronchogenic cyst- states no changes  . Unspecified essential hypertension     Medications:  Scheduled:    . docusate sodium  100 mg Oral BID  . esomeprazole   40 mg Oral Q1200  . fesoterodine  8 mg Oral Daily  . furosemide  10 mg Intravenous Once  . furosemide  20 mg Intravenous Once  . gabapentin  600 mg Oral QHS  . levothyroxine  175 mcg Oral QAC breakfast  . metoprolol  5 mg Intravenous NOW  . metoprolol tartrate  12.5 mg Oral BID  . rivaroxaban  10 mg Oral Q breakfast  . simvastatin  20 mg Oral QHS  . sodium chloride  500 mL Intravenous Once  . DISCONTD: cefTRIAXone (ROCEPHIN)  IV  1 g Intravenous Q24H   Infusions:    . DISCONTD: bupivacaine ON-Q pain pump     Assessment: 78 yo female s/p L TKA now with possible aspiration PNA to start Zosyn per pharmacy dosing  Goal of Therapy:  Zosyn adjustment per renal function  Plan:  Zosyn 3.375g IV q8 (extended interval infusion) for CrCl > 20 ml/min   Hessie Knows, PharmD, BCPS Pager [phone] 08/12/2011 9:40 PM

## 2011-08-12 NOTE — Consult Note (Signed)
PCP:   Horald Pollen., PA, PA   Chief Complaint:  78 yr old Cf presented initially to [facility] for L total knee arthroscopy 08/10/2011.  She was doing well until yesterday when it was noted that she developed significant pain in her chest and has been constant she hass been out fo the surgery-sharp pain and is hard to cdescribe.  The pain is assoc with some sickness on her sotmach-she did not reprt the CP yesterday to anyone yesterday but had 2 episodes of Vomiting yesterday and she was pretty disoriented yesterday-she thought she was home yesterdya and that she was going to go grocery shopping-she was on a dilaudid PCA until 17:30 pm yesterday and this was transitioned to PO Pain meds She overall is very anxious and a tangential historian with consistent needs to prompt for specific answers and although oriented to person, does seem to have some intermittent confusion as to where she is   The pain in the chest fever and tachycardia and became symptomatically anemic, necessitating transfusion of 2 U PRBC's. She does have some central CP and right now states that she has 9/10 CP-burning and aching type of pain in the chest.  Usually is totally independent and drives and does stuff for herself  States she has been sick since the the surgery-has nausea, and has had significant nausea.  She states that she felt some "pounding" in her heart today, and did have some abd pain and chest pain with radiation into the neck as well.  She vomited 2 times over the past 24 hours that has been documented .  Has had some headaches as well since she has been here  Review of Systems:  The patient denies anorexia, + fever, - weight loss, vision loss, decreased hearing, hoarseness, + chest pain, - syncope, peripheral edema, balance deficits, - hemoptysis, + abdominal pain, - melena, - hematochezia, - severe indigestion/heartburn, hematuria, - incontinence, + difficulty walking, depression, - unusual weight change, -  abnormal bleeding, enlarged lymph nodes, angioedema, and breast masses.  Past Medical History: Past Medical History  Diagnosis Date  . Other and unspecified hyperlipidemia   . Carotid artery disease     50-69% stenosis R.ICA,12.11        4/13 carotid dopplar EPIC  . Hypothyroidism   . Anemia   . GERD (gastroesophageal reflux disease)   . Arthritis   . Atrial fibrillation 4/07    EKG, Clearance and note, LOV Dr Andee Lineman EPIC  . Shortness of breath 4/07    with  walking long distances since removal bronchogenic cyst- states no changes  . Unspecified essential hypertension   Abdominal Pain 03/25/05-asympt cholelithiasis Thoracolumbar ddd +chronic back pain Hypothyroid R medial Lung mass-s/p bronch 03/29/05=Bornchogenic cyst P afib 07/19/2005-used to be on metoprolol and amiodarone-appears to have been taken off of meds?? Possible defibrillated? Bilateral rotator cuff tears S/p Video assit thorscopy +thoracotomy 4/27/7 S/p Lap repair Incarcerated Ventral hernia same date Transient SBO Sigmoid diverticulitis Dyslipidemia Dyshidrotic eczema R internal carotid artery steniosis60-79%       Past surgical history: Past Surgical History  Procedure Date  . Tonsillectomy   . Cholecystectomy   . Appendectomy   . Thoracotomy 4/07    removal bronchogenic cyst  . Total knee arthroplasty 08/10/2011    Procedure: TOTAL KNEE ARTHROPLASTY;  Surgeon: Loanne Drilling, MD;  Location: [facility];  Service: Orthopedics;  Laterality: Left;    Medications: Prior to Admission medications   Medication Sig Start Date End Date Taking?  Authorizing Provider  ALPRAZolam Prudy Feeler) 0.5 MG tablet Take 0.5 mg by mouth 3 (three) times daily as needed. anxiety   Yes Historical Provider, MD  conjugated estrogens (PREMARIN) vaginal cream Place vaginally 2 (two) times daily.   Yes Historical Provider, MD  esomeprazole (NEXIUM) 40 MG capsule Take 40 mg by mouth daily.    Yes Historical Provider, MD  gabapentin  (NEURONTIN) 300 MG capsule Take 2 capsules by mouth at bedtime. & at bedtime 01/04/11  Yes Historical Provider, MD  HYDROcodone-acetaminophen (VICODIN) 5-500 MG per tablet Take 1 tablet by mouth every 6 (six) hours as needed.    Yes Historical Provider, MD  levothyroxine (SYNTHROID, LEVOTHROID) 175 MCG tablet Take 175 mcg by mouth daily.    Yes Historical Provider, MD  simvastatin (ZOCOR) 20 MG tablet Take 20 mg by mouth daily.    Yes Historical Provider, MD  tolterodine (DETROL LA) 4 MG 24 hr capsule Take 4 mg by mouth daily.    Yes Historical Provider, MD  aspirin EC 81 MG tablet Take 81 mg by mouth daily.     Historical Provider, MD  Calcium Carbonate (CALCIUM 600 PO) Take 1 tablet by mouth daily.    Historical Provider, MD  Cholecalciferol (VITAMIN D3) 1000 UNITS CAPS Take 1 capsule by mouth daily.     Historical Provider, MD  fish oil-omega-3 fatty acids 1000 MG capsule Take 1 g by mouth daily.     Historical Provider, MD  Garlic Oil 1000 MG CAPS Take 1 capsule by mouth daily.     Historical Provider, MD  IRON CR PO Take 1 tablet by mouth daily.     Historical Provider, MD  Multiple Vitamin (MULTIVITAMIN) tablet Take 1 tablet by mouth daily.     Historical Provider, MD  oxaprozin (DAYPRO) 600 MG tablet Take 600 mg by mouth 2 (two) times daily.     Historical Provider, MD  vitamin A 8000 UNIT capsule Take 8,000 Units by mouth daily.     Historical Provider, MD  vitamin C (ASCORBIC ACID) 500 MG tablet Take 500 mg by mouth daily.     Historical Provider, MD  zinc gluconate 50 MG tablet Take 50 mg by mouth daily.    Historical Provider, MD    Allergies:   Allergies  Allergen Reactions  . Morphine   . Penicillins Itching    Social History:  reports that she has never smoked. She has never used smokeless tobacco. She reports that she does not drink alcohol or use illicit drugs.  Family History: Family History  Problem Relation Age of Onset  . Cancer    . Stroke    . Diabetes    .  Hypertension      Physical Exam: Filed Vitals:   08/12/11 1430 08/12/11 1615 08/12/11 1640 08/12/11 1915  BP: 151/64 185/77  176/99  Pulse: 70 73 94 152  Temp: 99.9 F (37.7 C) 99.7 F (37.6 C) 98.9 F (37.2 C) 100.4 F (38 C)  TempSrc: Oral Oral Oral Oral  Resp: 16 16  24   Height:      Weight:      SpO2:        HEENT-alert frail CF in NAD, no notable bruit, some JVD CHEST-cta b, no added sound.  NO tvr/tvf CARDS-s1 s2 tachycardic, regularily regular-Sinus tach vs Flutter 2:1 on EKG done earlier today ABD-soft, nt, nd.  No rebound, no gaurd SKIN-soft, no RLE swelling, however L knee does appear dark and red wit significant calf swelling  in the L side NEURO-alert, oriented x3 speech: normal in context and clarity memory: intact grossly cranial nerves II-XII: intact motor strength: full proximally and distally no involuntary movements or tremors sensation: intact to vibration, pain, and light touch cerebellar: finger-to-nose and heel-to-shin intact gait: normal reflexes: full and symmetric plantar responses: downgoing bilaterally   Labs on Admission:   Basename 08/12/11 0355 08/11/11 0432  NA 132* 136  K 3.9 4.1  CL 100 102  CO2 24 26  GLUCOSE 160* 138*  BUN 13 10  CREATININE 0.79 0.78  CALCIUM 8.1* 8.5  MG -- --  PHOS -- --   No results found for this basename: AST:2,ALT:2,ALKPHOS:2,BILITOT:2,PROT:2,ALBUMIN:2 in the last 72 hours No results found for this basename: LIPASE:2,AMYLASE:2 in the last 72 hours  Basename 08/12/11 0355 08/11/11 0432  WBC 22.4* 21.8*  NEUTROABS -- --  HGB 7.6* 9.4*  HCT 23.0* 29.3*  MCV 89.8 91.8  PLT 112* 168   No results found for this basename: CKTOTAL:3,CKMB:3,CKMBINDEX:3,TROPONINI:3 in the last 72 hours No results found for this basename: TSH,T4TOTAL,FREET3,T3FREE,THYROIDAB in the last 72 hours No results found for this basename: VITAMINB12:2,FOLATE:2,FERRITIN:2,TIBC:2,IRON:2,RETICCTPCT:2 in the last 72  hours  Radiological Exams on Admission: Dg Chest 2 View  08/12/2011  *RADIOLOGY REPORT*  Clinical Data: Nausea, vomiting.  Postop knee replacement. Elevated white count.  CHEST - 2 VIEW  Comparison: 08/02/2011  Findings: Heart is borderline in size.  Mild vascular congestion and interstitial prominence.  No confluent opacities or effusions. No acute bony abnormality.  IMPRESSION: New vascular congestion and interstitial prominence.  Cannot exclude early interstitial edema.  Original Report Authenticated By: Cyndie Chime, M.D.    Chest 2 View  08/02/2011  *RADIOLOGY REPORT*  Clinical Data: Preop radiograph.  Knee surgery.  CHEST - 2 VIEW  Comparison: 06/23/2006  Findings: Heart size is normal.  No pleural effusion or pulmonary edema.  No airspace consolidation.  There is a scoliosis deformity involving the thoracic spine which is convex to the right.  IMPRESSION:  1.  No acute cardiopulmonary abnormalities. 2.  Scoliosis  Original Report Authenticated By: Rosealee Albee, M.D.    Assessment/Plan  1-Sinus tachycardia-2/2 to ABLA vs Possible PE vs Volume depletion from N/V +/-Sepsis-will get stat CT chest given her recent operative state and immobility, get a AXR to rule out intrabdominal pathology and CXR.  PE seems less likely, but given h/o CP and sinus tachycardia, despite her being on Xarelto, feel this is prudent to be ruled out.  Keep on IVF at 75 cc/hour for 18 hours and will reassess in am--IF CE's come back + will consult cards.  See below  2-SVT-She flips into and out of Atrial flutter and has a h.o of P Afib, however this is not currently controlled on any agents and from review of chart this does not seem to have been any issue recently with Dr. Tommi Rumps gent's notes stating he couldn't;t find specific evidence of this on subsequent follow uop visits.  Her blood pressure is elevated and I feel we can symptomatically treat this with Metoprolol 12.5 Bid pending CT scan Chest to look for PE,  although she is on Xarelto and had a SCD on the RLE This could also be related to her ABLA, and we will rpt am CBC and review her trends We will continue her Alprazolam as well for the interim Would prefer to hold Reglan given her arrythmia and its potential to increase likelihood of this Would hold on getting TSH at current  time-we will involve cardiology in her care-these symptoms may be a surrogate for ACS and will cycle cardiac enzymes to rule this out.   Her EKG done today shows A flutter with 2:1 block and slight St-T wave depressions across precordial leads, possibly rate related with 1 mm depressions-compare dto prior EKG 02/2011 that was NSR with no ST depressions   2 Abla-probably related to surgery.  Will guaiac stools to ensure no GI losses-she is currently on Xarelto which can worsen this-will change her over to Coumadin with Lovenox overlap.  3-n/v- Could be temporally related to her being on various Pain medications +/- Xarelto abdominal pathology.  Would starts with Axr as she has a h/o SBO and if this is equivocal in any way will CT abdomen-her abdomen on exam seems relatively benign--will continue PPI.  Will back down to clear fluids only with meds until AXR done-could represent also a post-op ileus and if not able to take PO, would put in NGT  4-Leukocytosis-could be related to her recent arthroscopy.  Will Get blood and urine cultures and broaden spectrum of ABx to Cipro/Flagyl, given she had a fever yesterday above what would be considered usual post op fever and still has low grade temperatures  5-TCP-Could be 2/2 to loss of total blood cell mass.  Will save platelet smear and reasses.  >60 minutes Discussed case with Son in room who understands  We will follow as medical consult-to be seen in am by Dr. Waymon Amato, Lucien Mons tm 2  After discussion with cardiologist, Dr. Charm Barges, it was thoguht she might benefit from a Cardizem Gtt and will probably require SDU placement for closer  monitoring I will Transfer her out to SDU  I will alert CCM to her presence if she further decompensates    Rubbie Goostree,JAI 08/12/2011, 8:23 PM

## 2011-08-12 NOTE — Consult Note (Addendum)
Cardiology IP Consult  Reason for Consult:afib Referring Physician: Dr. Ellamae Sia  HPI: [name] [name] is a 78 y.o.female with a remote history of AF followed by Dr. Andee Lineman who was admitted a few days ago for total knee arthroplasty.  I was consulted by the hospitalist tonight when she was noted to be in rapid afib.  She underwent arthroplasty on the 15th.  Since yesterday she has been complaining of sharp chest pain and n/v.  Upon my evaluation tonight she is somewhat delirious and unable to give much history.  She dose complain of sharp mid epigastric/chest pain that she states has been constant for a week.  She has continued to have dry heaving tonight.  She does not really have any SOB.  She cannot give any details of her past history of afib and per Dr. Margarita Mail note there is little documentation of this and she has never been on coumadin.  Per nursing she has not had any obvious bleeding; however, her hgb has dropped significantly over the last 24hrs and she received 2 units PRBCs.  PMH/PSH/FH/SH was reviewed in the chart and prior notes.    Past Medical History  Diagnosis Date  . Other and unspecified hyperlipidemia   . Carotid artery disease     50-69% stenosis R.ICA,12.11        4/13 carotid dopplar EPIC  . Hypothyroidism   . Anemia   . GERD (gastroesophageal reflux disease)   . Arthritis   . Atrial fibrillation 4/07    Remote h/o amiodarone use, recurred in 07/2011 post op from knee arthroplasty  . Shortness of breath 4/07    with  walking long distances since removal bronchogenic cyst- states no changes  . Unspecified essential hypertension     Past Surgical History  Procedure Date  . Tonsillectomy   . Cholecystectomy   . Appendectomy   . Thoracotomy 4/07    removal bronchogenic cyst  . Total knee arthroplasty 08/10/2011    Procedure: TOTAL KNEE ARTHROPLASTY;  Surgeon: Loanne Drilling, MD;  Location: [facility];  Service: Orthopedics;  Laterality: Left;    Family History  Problem  Relation Age of Onset  . Cancer    . Stroke    . Diabetes    . Hypertension      Social History:  reports that she has never smoked. She has never used smokeless tobacco. She reports that she does not drink alcohol or use illicit drugs.  Allergies:  Allergies  Allergen Reactions  . Morphine   . Penicillins Itching    Current Facility-Administered Medications  Medication Dose Route Frequency Provider Last Rate Last Dose  . acetaminophen (TYLENOL) tablet 650 mg  650 mg Oral Q6H PRN Loanne Drilling, MD   650 mg at 08/12/11 1246   Or  . acetaminophen (TYLENOL) suppository 650 mg  650 mg Rectal Q6H PRN Loanne Drilling, MD      . ALPRAZolam Prudy Feeler) tablet 0.5 mg  0.5 mg Oral TID PRN Loanne Drilling, MD   0.5 mg at 08/11/11 2244  . bisacodyl (DULCOLAX) suppository 10 mg  10 mg Rectal Daily PRN Loanne Drilling, MD      . ciprofloxacin (CIPRO) IVPB 400 mg  400 mg Intravenous Q12H Berkley Harvey, [place]      . coumadin book   Does not apply Once Berkley Harvey, PHARMD      . diltiazem (CARDIZEM) 1 mg/mL load via infusion 10 mg  10 mg Intravenous Once [employer],  MD   10 mg at 08/12/11 2310   And  . diltiazem (CARDIZEM) 100 mg in dextrose 5 % 100 mL infusion  5-15 mg/hr Intravenous Continuous Rhetta Mura, MD 5 mL/hr at 08/12/11 2308 5 mg/hr at 08/12/11 2308  . diphenhydrAMINE (BENADRYL) 12.5 MG/5ML elixir 12.5-25 mg  12.5-25 mg Oral Q4H PRN Loanne Drilling, MD      . docusate sodium (COLACE) capsule 100 mg  100 mg Oral BID Loanne Drilling, MD   100 mg at 08/12/11 1118  . enoxaparin (LOVENOX) injection 40 mg  40 mg Subcutaneous Q24H Berkley Harvey, [place]      . esomeprazole (NEXIUM) capsule 40 mg  40 mg Oral Q1200 Loanne Drilling, MD   40 mg at 08/12/11 1118  . fesoterodine (TOVIAZ) tablet 8 mg  8 mg Oral Daily Loanne Drilling, MD   8 mg at 08/12/11 1118  . furosemide (LASIX) injection 10 mg  10 mg Intravenous Once Loanne Drilling, MD   10 mg at 08/12/11  2001  . furosemide (LASIX) injection 20 mg  20 mg Intravenous Once Alexzandrew Perkins, PA      . furosemide (LASIX) injection 80 mg  80 mg Intravenous BID Rhetta Mura, MD   80 mg at 08/12/11 2310  . gabapentin (NEURONTIN) capsule 600 mg  600 mg Oral QHS Loanne Drilling, MD   600 mg at 08/11/11 2248  . iohexol (OMNIPAQUE) 300 MG/ML solution 100 mL  100 mL Intravenous Once PRN Medication Radiologist, MD   100 mL at 08/12/11 2228  . levothyroxine (SYNTHROID, LEVOTHROID) tablet 175 mcg  175 mcg Oral QAC breakfast Loanne Drilling, MD   175 mcg at 08/12/11 1118  . menthol-cetylpyridinium (CEPACOL) lozenge 3 mg  1 lozenge Oral PRN Loanne Drilling, MD       Or  . phenol (CHLORASEPTIC) mouth spray 1 spray  1 spray Mouth/Throat PRN Loanne Drilling, MD      . methocarbamol (ROBAXIN) tablet 500 mg  500 mg Oral Q8H PRN Rhetta Mura, MD       Or  . methocarbamol (ROBAXIN) 500 mg in dextrose 5 % 50 mL IVPB  500 mg Intravenous Q8H PRN Rhetta Mura, MD      . metoprolol (LOPRESSOR) injection 5 mg  5 mg Intravenous NOW Jinger Neighbors, NP   5 mg at 08/12/11 2055  . metoprolol tartrate (LOPRESSOR) tablet 12.5 mg  12.5 mg Oral BID Rhetta Mura, MD   12.5 mg at 08/12/11 2142  . metroNIDAZOLE (FLAGYL) IVPB 500 mg  500 mg Intravenous Q8H Berkley Harvey, [place]      . ondansetron [facility]) tablet 4 mg  4 mg Oral Q6H PRN Loanne Drilling, MD       Or  . ondansetron [facility]) injection 4 mg  4 mg Intravenous Q6H PRN Loanne Drilling, MD   4 mg at 08/12/11 2028  . polyethylene glycol (MIRALAX / GLYCOLAX) packet 17 g  17 g Oral Daily PRN Loanne Drilling, MD      . promethazine (PHENERGAN) injection 6.25 mg  6.25 mg Intravenous Q6H PRN Alexzandrew Perkins, PA   6.25 mg at 08/12/11 2320  . simvastatin (ZOCOR) tablet 20 mg  20 mg Oral QHS Loanne Drilling, MD      . sodium chloride 0.9 % bolus 500 mL  500 mL Intravenous Once Alexzandrew Perkins, PA      . temazepam (RESTORIL) capsule 15-30 mg   15-30 mg Oral  QHS PRN Loanne Drilling, MD      . traMADol Janean Sark) tablet 50-100 mg  50-100 mg Oral Q6H PRN Loanne Drilling, MD   50 mg at 08/11/11 1610  . warfarin (COUMADIN) tablet 5 mg  5 mg Oral Once Berkley Harvey, PHARMD      . warfarin (COUMADIN) video   Does not apply Once Berkley Harvey, [facility]      . Warfarin - Pharmacist Dosing Inpatient   Does not apply q1800 Berkley Harvey, PHARMD      . DISCONTD: 0.9 %  sodium chloride infusion   Intravenous Continuous Rhetta Mura, MD      . DISCONTD: bupivacaine ON-Q pain pump   Other Continuous Loanne Drilling, MD      . DISCONTD: cefTRIAXone (ROCEPHIN) 1 g in dextrose 5 % 50 mL IVPB  1 g Intravenous Q24H Alexzandrew Perkins, PA   1 g at 08/12/11 1000  . DISCONTD: HYDROmorphone (DILAUDID) injection 0.5-1 mg  0.5-1 mg Intravenous Q2H PRN Alexzandrew Perkins, PA      . DISCONTD: methocarbamol (ROBAXIN) 500 mg in dextrose 5 % 50 mL IVPB  500 mg Intravenous Q6H PRN Loanne Drilling, MD   500 mg at 08/10/11 1417  . DISCONTD: methocarbamol (ROBAXIN) tablet 500 mg  500 mg Oral Q6H PRN Loanne Drilling, MD   500 mg at 08/11/11 0823  . DISCONTD: metoCLOPramide (REGLAN) injection 5-10 mg  5-10 mg Intravenous Q8H PRN Loanne Drilling, MD   10 mg at 08/12/11 1939  . DISCONTD: metoCLOPramide (REGLAN) tablet 5-10 mg  5-10 mg Oral Q8H PRN Loanne Drilling, MD      . DISCONTD: oxyCODONE (Oxy IR/ROXICODONE) immediate release tablet 5-10 mg  5-10 mg Oral Q3H PRN Loanne Drilling, MD   5 mg at 08/11/11 1208  . DISCONTD: rivaroxaban (XARELTO) tablet 10 mg  10 mg Oral Q breakfast Loanne Drilling, MD   10 mg at 08/12/11 1118    ROS: unable to obtain due to delerium  Physical Exam: Blood pressure 193/114, pulse 144, temperature 99.5 F (37.5 C), temperature source Oral, resp. rate 22, height 5' 5.5" (1.664 m), weight 75.751 kg (167 lb), SpO2 89.00%.  GENERAL: mild distress EYES: Extra ocular movements are intact. There is no lid lag. Sclera is  anicteric.  ENT: Oropharynx is clear. Dentition is within normal limits.  NECK: Supple. The thyroid is not enlarged.  LYMPH: There are no masses or lymphadenopathy present.  HEART: irregular, tachy, no m/r/g, no JVD LUNGS: basilar crackles ABDOMEN: Soft, mild mid epigastric TTP, no rebound or guarding.  EXTREMITIES: No clubbing, cyanosis, or edema. Left knee dressing in place PULSES: . DP/PT pulses were +1 and equal bilaterally.  SKIN: Warm, dry, and intact.  NEUROLOGIC: The patient was not oriented to place but knew the month and day, moves all ext well, alert   Results: Results for orders placed during the hospital encounter of 08/10/11 (from the past 24 hour(s))  CBC     Status: Abnormal   Collection Time   08/12/11  3:55 AM      Component Value Range   WBC 22.4 (*) 4.0 - 10.5 (K/uL)   RBC 2.56 (*) 3.87 - 5.11 (MIL/uL)   Hemoglobin 7.6 (*) 12.0 - 15.0 (g/dL)   HCT 96.0 (*) 45.4 - 46.0 (%)   MCV 89.8  78.0 - 100.0 (fL)   MCH 29.7  26.0 - 34.0 (pg)   MCHC 33.0  30.0 - 36.0 (  g/dL)   RDW 16.1  09.6 - 04.5 (%)   Platelets 112 (*) 150 - 400 (K/uL)  BASIC METABOLIC PANEL     Status: Abnormal   Collection Time   08/12/11  3:55 AM      Component Value Range   Sodium 132 (*) 135 - 145 (mEq/L)   Potassium 3.9  3.5 - 5.1 (mEq/L)   Chloride 100  96 - 112 (mEq/L)   CO2 24  19 - 32 (mEq/L)   Glucose, Bld 160 (*) 70 - 99 (mg/dL)   BUN 13  6 - 23 (mg/dL)   Creatinine, Ser 4.09  0.50 - 1.10 (mg/dL)   Calcium 8.1 (*) 8.4 - 10.5 (mg/dL)   GFR calc non Af Amer 78 (*) >90 (mL/min)   GFR calc Af Amer >90  >90 (mL/min)  PREPARE RBC (CROSSMATCH)     Status: Normal   Collection Time   08/12/11  8:30 AM      Component Value Range   Order Confirmation ORDER PROCESSED BY BLOOD BANK     CTA chest : no PE, increased size of infrahilar opacity (area of prior surgery) CXR: retrocardiac mass as above EKG: afib with RVR, mild inferolateral ST depression Tele reviewed: she is at times in aflutter with  2:1 conduction and other times in afib, no sinus rhythm seen  Assessment/Plan: 78 yo WF with h/o paroxysmal AF now with fever, leukocytosis, anemia and atrial fibrillation with RVR after total knee arthroplasty on 08/10/11.  I suspect her AF is being driven by acute blood loss anemia and likely infection.  CTA chest was negative for PE.  I would not be surprised if cardiac enzymes are mildly elevated secondary to demand ischemia from anemia and tachycardia. - agree with transfer to ICU - agree with urine/blood cultures and broad spectrum antibiotics - unclear etiology of significant hgb drop 12->7, agree with transfusion and following hgb closely - would control rate with diltiazem ggt for now, I suspect she will improve after blood products and antibiotics - follow cardiac enzymes tonight - check TSH, would wait to get echo until after HR has been controlled - would avoid further doses of lasix for now  [facility] 08/12/2011, 11:24 PM

## 2011-08-12 NOTE — Progress Notes (Signed)
OT Note:  Pt not ready for OT today:  Was receiving blood earlier and now has nausea.  Noted pt plans to go to [facility] for rehab.  Will check back tomorrow to see if pt wants to begin OT in acute vs. Waiting for next venue.  [place], OTR/L [phone] 08/12/2011

## 2011-08-12 NOTE — Progress Notes (Signed)
CSW assisting with d/c planning to SNF. [facility] will have a bed available on Monday for this pt. CSW will assist with d/c planning to SNF when stable.  Cori Razor  LCSW [phone]

## 2011-08-13 ENCOUNTER — Inpatient Hospital Stay (HOSPITAL_COMMUNITY): Payer: Medicare Other

## 2011-08-13 DIAGNOSIS — R509 Fever, unspecified: Secondary | ICD-10-CM

## 2011-08-13 DIAGNOSIS — I4891 Unspecified atrial fibrillation: Secondary | ICD-10-CM | POA: Diagnosis not present

## 2011-08-13 DIAGNOSIS — G9340 Encephalopathy, unspecified: Secondary | ICD-10-CM | POA: Diagnosis present

## 2011-08-13 DIAGNOSIS — D62 Acute posthemorrhagic anemia: Secondary | ICD-10-CM

## 2011-08-13 DIAGNOSIS — D696 Thrombocytopenia, unspecified: Secondary | ICD-10-CM | POA: Diagnosis not present

## 2011-08-13 DIAGNOSIS — E876 Hypokalemia: Secondary | ICD-10-CM | POA: Diagnosis not present

## 2011-08-13 LAB — CARDIAC PANEL(CRET KIN+CKTOT+MB+TROPI)
CK, MB: 2.4 ng/mL (ref 0.3–4.0)
CK, MB: 1.6 ng/mL (ref 0.3–4.0)
CK, MB: 1.6 ng/mL (ref 0.3–4.0)
Relative Index: 1.2 (ref 0.0–2.5)
Relative Index: 1 (ref 0.0–2.5)
Total CK: 204 U/L — ABNORMAL HIGH (ref 7–177)
Total CK: 200 U/L — ABNORMAL HIGH (ref 7–177)
Troponin I: 0.62 ng/mL (ref <0.30)

## 2011-08-13 LAB — URINALYSIS, ROUTINE W REFLEX MICROSCOPIC
Glucose, UA: NEGATIVE mg/dL
Ketones, ur: NEGATIVE mg/dL
Leukocytes, UA: NEGATIVE
Nitrite: NEGATIVE
Protein, ur: NEGATIVE mg/dL
pH: 7 (ref 5.0–8.0)

## 2011-08-13 LAB — TYPE AND SCREEN: Unit division: 0

## 2011-08-13 LAB — BASIC METABOLIC PANEL
BUN: 14 mg/dL (ref 6–23)
Calcium: 8.5 mg/dL (ref 8.4–10.5)
GFR calc non Af Amer: 65 mL/min — ABNORMAL LOW (ref >90)
Glucose, Bld: 136 mg/dL — ABNORMAL HIGH (ref 70–99)
Sodium: 129 mEq/L — ABNORMAL LOW (ref 135–145)

## 2011-08-13 LAB — CBC
MCH: 28.5 pg (ref 26.0–34.0)
Platelets: 129 10*3/uL — ABNORMAL LOW (ref 150–400)
RBC: 3.68 MIL/uL — ABNORMAL LOW (ref 3.87–5.11)
RDW: 15.9 % — ABNORMAL HIGH (ref 11.5–15.5)
WBC: 21.3 10*3/uL — ABNORMAL HIGH (ref 4.0–10.5)

## 2011-08-13 LAB — URINE MICROSCOPIC-ADD ON

## 2011-08-13 MED ORDER — CHLORHEXIDINE GLUCONATE CLOTH 2 % EX PADS
6.0000 | MEDICATED_PAD | Freq: Every day | CUTANEOUS | Status: AC
  Administered 2011-08-13 – 2011-08-17 (×4): 6 via TOPICAL

## 2011-08-13 MED ORDER — DEXTROSE 5 % IV SOLN
2.0000 g | Freq: Three times a day (TID) | INTRAVENOUS | Status: DC
  Administered 2011-08-13 – 2011-08-16 (×9): 2 g via INTRAVENOUS
  Filled 2011-08-13 (×12): qty 2

## 2011-08-13 MED ORDER — METOPROLOL TARTRATE 1 MG/ML IV SOLN
INTRAVENOUS | Status: AC
  Administered 2011-08-13: 5 mg via INTRAVENOUS
  Filled 2011-08-13: qty 5

## 2011-08-13 MED ORDER — AZTREONAM 2 G IJ SOLR
2.0000 g | Freq: Three times a day (TID) | INTRAMUSCULAR | Status: DC
  Filled 2011-08-13 (×3): qty 2

## 2011-08-13 MED ORDER — ATORVASTATIN CALCIUM 10 MG PO TABS
10.0000 mg | ORAL_TABLET | Freq: Every day | ORAL | Status: DC
  Administered 2011-08-13 – 2011-08-17 (×5): 10 mg via ORAL
  Filled 2011-08-13 (×6): qty 1

## 2011-08-13 MED ORDER — VANCOMYCIN HCL IN DEXTROSE 1-5 GM/200ML-% IV SOLN
1000.0000 mg | Freq: Two times a day (BID) | INTRAVENOUS | Status: DC
  Administered 2011-08-13 – 2011-08-16 (×7): 1000 mg via INTRAVENOUS
  Filled 2011-08-13 (×8): qty 200

## 2011-08-13 MED ORDER — METOPROLOL TARTRATE 1 MG/ML IV SOLN
5.0000 mg | INTRAVENOUS | Status: AC
  Administered 2011-08-13: 5 mg via INTRAVENOUS

## 2011-08-13 MED ORDER — POTASSIUM CHLORIDE CRYS ER 20 MEQ PO TBCR
40.0000 meq | EXTENDED_RELEASE_TABLET | Freq: Once | ORAL | Status: AC
  Administered 2011-08-13: 40 meq via ORAL
  Filled 2011-08-13: qty 2

## 2011-08-13 MED ORDER — RIVAROXABAN 10 MG PO TABS
20.0000 mg | ORAL_TABLET | Freq: Every day | ORAL | Status: DC
  Administered 2011-08-14: 20 mg via ORAL
  Filled 2011-08-13 (×2): qty 2

## 2011-08-13 MED ORDER — DIPHENHYDRAMINE HCL 12.5 MG/5ML PO ELIX: 12.5000 mg | ORAL_SOLUTION | ORAL | Status: DC | PRN

## 2011-08-13 MED ORDER — TEMAZEPAM 7.5 MG PO CAPS: 7.5000 mg | ORAL_CAPSULE | Freq: Every evening | ORAL | Status: DC | PRN

## 2011-08-13 MED ORDER — ZOLPIDEM TARTRATE 5 MG PO TABS: 5.0000 mg | ORAL_TABLET | Freq: Every evening | ORAL | Status: DC | PRN

## 2011-08-13 MED ORDER — MUPIROCIN 2 % EX OINT
1.0000 "application " | TOPICAL_OINTMENT | Freq: Two times a day (BID) | CUTANEOUS | Status: AC
  Administered 2011-08-13 – 2011-08-17 (×10): 1 via NASAL
  Filled 2011-08-13: qty 22

## 2011-08-13 NOTE — Progress Notes (Signed)
[name] [name]  MRN: [id number] DOB/Age: 1930/11/06 77 y.o. Physician: Jacquelyne Balint Procedure: Procedure(s) (LRB): TOTAL KNEE ARTHROPLASTY (Left)     Subjective: Feels "lousy" everything hurts and generally feels ill  Vital Signs Temp:  [98 F (36.7 C)-102 F (38.9 C)] 101 F (38.3 C) (05/18 1100) Pulse Rate:  [70-174] 79  (05/18 1200) Resp:  [16-36] 27  (05/18 1200) BP: (109-211)/(49-131) 118/63 mmHg (05/18 1200) SpO2:  [89 %-99 %] 96 % (05/18 1200) FiO2 (%):  [2 %-4 %] 2 % (05/18 0800)  Lab Results  Basename 08/13/11 0150 08/12/11 0355  WBC 21.3* 22.4*  HGB 10.5* 7.6*  HCT 31.5* 23.0*  PLT 129* 112*   BMET  Basename 08/13/11 0150 08/12/11 0355  NA 129* 132*  K 3.0* 3.9  CL 88* 100  CO2 27 24  GLUCOSE 136* 160*  BUN 14 13  CREATININE 0.84 0.79  CALCIUM 8.5 8.1*   INR  Date Value Range Status  08/13/2011 1.66* 0.00-1.49 (no units) Final     Exam Awakens to answer questions but appears ill Moves toes on command Knee with no active drainage. Incision is dry. No erythema or sign of infection at the knee        Plan Cont management per Cardiology and Intensive care team Will follow  [facility] for Dr.Kevin Supple 08/13/2011, 1:37 PM

## 2011-08-13 NOTE — Progress Notes (Signed)
The order for simvastatin(Zocor) was changed to an equivalent dose of atorvastatin(Lipitor) due to the potential drug interaction with ___diltiazem_____.  When taken in combination with medications that inhibit its metabolism, simvastatin can accumulate which increases the risk of liver toxicity, myopathy, or rhabdomyolysis.  Simvastatin dose should not exceed 10mg /day in patients taking verapamil, diltiazem, fibrates, or niacin >or= 1g/day.   Simvastatin dose should not exceed 20mg /day in patients taking amlodipine, ranolazine or amiodarone.   Please consider this potential interaction at discharge.  Gwen Her 08/13/2011 3:09 PM

## 2011-08-13 NOTE — Progress Notes (Signed)
PT/OT Cancellation Note  Treatment cancelled today due to medical issues with patient which prohibited therapy: RN requesting therapy to hold today secondary to pt constantly nauseated as well as with rapid HR and on cardizem drip. Will check back as schedule allows. Thank you.  Glendale Chard, OTR/L Pager: [phone] 08/13/2011    Emre Stock 08/13/2011, 10:13 AM

## 2011-08-13 NOTE — Progress Notes (Signed)
Patient ID: [name] [name], female   DOB: 1931-01-09, 78 y.o.   MRN: [id number] Chart and current trends reviewed. VR greater thaan 100. Hemodynamically stable. Continue IV diltiazem and titrate for HR of <90/min. Will follow. See Dr Charm Barges note at 1159PM.

## 2011-08-13 NOTE — Progress Notes (Signed)
ANTICOAGULATION CONSULT NOTE - Follow Up Consult  Pharmacy Consult for Lovenox/Coumadin Indication: VTE ppx  Allergies  Allergen Reactions  . Morphine   . Penicillins Itching    Patient Measurements: Height: 5' 5.5" (166.4 cm) Weight: 167 lb (75.751 kg) IBW/kg (Calculated) : 58.15    Vital Signs: Temp: 102 F (38.9 C) (05/18 0800) Temp src: Oral (05/18 0800) BP: 133/56 mmHg (05/18 0800) Pulse Rate: 71  (05/18 0800)  Labs:  Basename 08/13/11 0511 08/13/11 0150 08/12/11 2259 08/12/11 0355 08/11/11 0432  HGB -- 10.5* -- 7.6* --  HCT -- 31.5* -- 23.0* 29.3*  PLT -- 129* -- 112* 168  APTT -- -- -- -- --  LABPROT -- 19.9* -- -- --  INR -- 1.66* -- -- --  HEPARINUNFRC -- -- -- -- --  CREATININE -- 0.84 -- 0.79 0.78  CKTOTAL 200* -- 204* -- --  CKMB 1.6 -- 2.4 -- --  TROPONINI 1.04* -- 1.06* -- --    Estimated Creatinine Clearance: 56.8 ml/min (by C-G formula based on Cr of 0.84).   Medications:  Scheduled:    . aztreonam  2 g Intramuscular Q8H  . Chlorhexidine Gluconate Cloth  6 each Topical Q0600  . coumadin book   Does not apply Once  . diltiazem  10 mg Intravenous Once  . docusate sodium  100 mg Oral BID  . enoxaparin (LOVENOX) injection  40 mg Subcutaneous Q24H  . esomeprazole  40 mg Oral Q1200  . fesoterodine  8 mg Oral Daily  . furosemide  10 mg Intravenous Once  . gabapentin  600 mg Oral QHS  . levothyroxine  175 mcg Oral QAC breakfast  . metoprolol  5 mg Intravenous NOW  . metoprolol  5 mg Intravenous STAT  . metoprolol tartrate  12.5 mg Oral BID  . mupirocin ointment  1 application Nasal BID  . potassium chloride  40 mEq Oral Once  . simvastatin  20 mg Oral QHS  . sodium chloride  500 mL Intravenous Once  . vancomycin  1,000 mg Intravenous Q12H  . warfarin  5 mg Oral Once  . warfarin   Does not apply Once  . Warfarin - Pharmacist Dosing Inpatient   Does not apply q1800  . DISCONTD: cefTRIAXone (ROCEPHIN)  IV  1 g Intravenous Q24H  . DISCONTD:  ciprofloxacin  400 mg Intravenous Q12H  . DISCONTD: furosemide  20 mg Intravenous Once  . DISCONTD: furosemide  80 mg Intravenous BID  . DISCONTD: metronidazole  500 mg Intravenous Q8H  . DISCONTD: rivaroxaban  10 mg Oral Q breakfast    Assessment:  80 YOF s/p TKA 5/15, was on Xarelto x 2 doses post op (5/16-17).   Changed to Lovenox/Warfarin for ppx 5/17  Lovenox 40mg  sq q24h, first dose due this morning (1100)  Warfarin 5mg  ordered last night but not given until ~0200 this am  INR 1.66 at 0200, not reflective of Coumadin since dose given at around the same time the lab was drawn  INR likely elevated d/t effects of Xarelto  CT shows no PE  No bleeding reported   Goal of Therapy:  INR 2-3 Monitor platelets by anticoagulation protocol: Yes   Plan:   No further Coumadin today  F/U AM INR   Continue LMWH until INR >=2  Gwen Her PharmD  [phone] 08/13/2011 10:46 AM

## 2011-08-13 NOTE — Progress Notes (Signed)
Name: QUINCIE HAROON MRN: [id number] DOB: 04-09-1930  ELECTRONIC ICU PHYSICIAN NOTE  Problem:  K 3.0  Intervention:  KCL 40 meq po   Sandrea Hughs 08/13/2011, 5:10 AM

## 2011-08-13 NOTE — Progress Notes (Signed)
ANTIBIOTIC CONSULT NOTE - INITIAL  Pharmacy Consult for Vancomycin/Aztreonam (changed from Cipro/Flagyl) Indication: Febrile illness- unclear source. L TKA on 5/15. UA- seems clean. CT chest= No PNA  Allergies  Allergen Reactions  . Morphine   . Penicillins Itching    Patient Measurements: Height: 5' 5.5" (166.4 cm) Weight: 167 lb (75.751 kg) IBW/kg (Calculated) : 58.15   Vital Signs: Temp: 102 F (38.9 C) (05/18 0800) Temp src: Oral (05/18 0800) BP: 133/56 mmHg (05/18 0800) Pulse Rate: 71  (05/18 0800) Intake/Output from previous day: 05/17 0701 - 05/18 0700 In: 1125.8 [P.O.:220; I.V.:75; Blood:530.8; IV Piggyback:300] Out: 5650 [Urine:5650] Intake/Output from this shift: Total I/O In: 100 [P.O.:100] Out: -   Labs:  Basename 08/13/11 0150 08/12/11 0355 08/11/11 0432  WBC 21.3* 22.4* 21.8*  HGB 10.5* 7.6* 9.4*  PLT 129* 112* 168  LABCREA -- -- --  CREATININE 0.84 0.79 0.[name]   Estimated Creatinine Clearance: 56.8 ml/min (by C-G formula based on Cr of 0.84). Normalized CrCl 34ml/min/1.73m2   Microbiology: Recent Results (from the past 720 hour(s))  SURGICAL PCR SCREEN     Status: Normal   Collection Time   08/02/11 12:00 PM      Component Value Range Status Comment   MRSA, PCR NEGATIVE  NEGATIVE  Final    Staphylococcus aureus NEGATIVE  NEGATIVE  Final   MRSA PCR SCREENING     Status: Abnormal   Collection Time   08/13/11 12:50 AM      Component Value Range Status Comment   MRSA by PCR POSITIVE (*) NEGATIVE  Final   TECHNOLOGIST SMEAR REVIEW     Status: Normal   Collection Time   08/13/11  1:50 AM      Component Value Range Status Comment   Path Review POLYCHROMASIA PRESENT   Final     Medical History: Past Medical History  Diagnosis Date  . Other and unspecified hyperlipidemia   . Carotid artery disease     50-69% stenosis R.ICA,12.11        4/13 carotid dopplar EPIC  . Hypothyroidism   . Anemia   . GERD (gastroesophageal reflux disease)   . Arthritis    . Atrial fibrillation 4/07    Remote h/o amiodarone use, recurred in 07/2011 post op from knee arthroplasty  . Shortness of breath 4/07    with  walking long distances since removal bronchogenic cyst- states no changes  . Unspecified essential hypertension     Medications:  Anti-infectives     Start     Dose/Rate Route Frequency Ordered Stop   08/13/11 1200   vancomycin (VANCOCIN) IVPB 1000 mg/200 mL premix        1,000 mg 200 mL/hr over 60 Minutes Intravenous Every 12 hours 08/13/11 1020     08/13/11 1100   aztreonam (AZACTAM) injection 2 g        2 g Intramuscular Every 8 hours 08/13/11 1018     08/12/11 2300   metroNIDAZOLE (FLAGYL) IVPB 500 mg  Status:  Discontinued        500 mg 100 mL/hr over 60 Minutes Intravenous Every 8 hours 08/12/11 2204 08/13/11 0934   08/12/11 2300   ciprofloxacin (CIPRO) IVPB 400 mg  Status:  Discontinued        400 mg 200 mL/hr over 60 Minutes Intravenous Every 12 hours 08/12/11 2204 08/13/11 0934   08/12/11 0900   cefTRIAXone (ROCEPHIN) 1 g in dextrose 5 % 50 mL IVPB  Status:  Discontinued  1 g 100 mL/hr over 30 Minutes Intravenous Every 24 hours 08/12/11 0752 08/12/11 2129   08/11/11 0100   vancomycin (VANCOCIN) IVPB 1000 mg/200 mL premix        1,000 mg 200 mL/hr over 60 Minutes Intravenous Every 12 hours 08/10/11 1404 08/11/11 0114   08/10/11 1005   vancomycin (VANCOCIN) IVPB 1000 mg/200 mL premix  Status:  Discontinued        1,000 mg 200 mL/hr over 60 Minutes Intravenous 60 min pre-op 08/10/11 1005 08/10/11 1546         Assessment: [name] [name] w/ fever of unknown source. Was on D#2 of Flagyl and Cipro, now to change to Vanc/Aztreonam given Tm 102  Goal of Therapy:  Vancomycin trough level 15-20 mcg/ml Appropriate dose of Aztreonam   Plan:   Aztreonam 2g IV q8h  Vancomycin 1g IV q12h  Follow labs vitals and cultures  Vancomycin trough at steady state  Adjust doses as appropriate  Gwen Her PharmD    [phone] 08/13/2011 10:26 AM

## 2011-08-13 NOTE — Progress Notes (Addendum)
Subjective:   Chart reviewed. Intermittent high fevers since 5/16, this am 102.6. Confusion since 5/17. Transferred to ICU last night 2/2 rapid Afib. No vomiting at least since midnight last night. Confusion seems slightly better per nursing. Denies CP. Some epigastric discomfort.  Objective  Vital signs in last 24 hours: Filed Vitals:   08/13/11 0200 08/13/11 0300 08/13/11 0400 08/13/11 0800  BP: 135/84 132/73 127/77 133/56  Pulse: 123 135 132 71  Temp:   98 F (36.7 C) 102 F (38.9 C)  TempSrc:   Oral Oral  Resp: 22 26 22  33  Height:      Weight:      SpO2: 99% 97% 96% 95%   Weight change:   Intake/Output Summary (Last 24 hours) at 08/13/11 0939 Last data filed at 08/13/11 0800  Gross per 24 hour  Intake 1105.84 ml  Output   5650 ml  Net -4544.16 ml    Physical Exam:  General Exam: Comfortable.  Respiratory System: Clear. No increased work of breathing.  Cardiovascular System: First and second heart sounds heard. Irregularly irregular and tachycardic. No JVD/murmurs/pedal edema.  Gastrointestinal System: Abdomen is non distended, soft and normal bowel sounds heard. Non Tender.  Central Nervous System: Alert and oriented to self only. No focal neurological deficits. Extremities: Left knee surgical site dressing looks clean, dry and intact. Neck: Supple.  Labs:  Basic Metabolic Panel:  Lab 08/13/11 1610 08/12/11 0355 08/11/11 0432  NA 129* 132* 136  K 3.0* 3.9 4.1  CL 88* 100 102  CO2 27 24 26   GLUCOSE 136* 160* 138*  BUN 14 13 10   CREATININE 0.84 0.79 0.78  CALCIUM 8.5 8.1* 8.5  ALB -- -- --  PHOS -- -- --   Liver Function Tests: No results found for this basename: AST:3,ALT:3,ALKPHOS:3,BILITOT:3,PROT:3,ALBUMIN:3 in the last 168 hours No results found for this basename: LIPASE:3,AMYLASE:3 in the last 168 hours No results found for this basename: AMMONIA:3 in the last 168 hours CBC:  Lab 08/13/11 0150 08/12/11 0355 08/11/11 0432  WBC 21.3* 22.4* 21.8*    NEUTROABS -- -- --  HGB 10.5* 7.6* 9.4*  HCT 31.5* 23.0* 29.3*  MCV 85.6 89.8 91.8  PLT 129* 112* 168   Cardiac Enzymes:  Lab 08/13/11 0511 08/12/11 2259  CKTOTAL 200* 204*  CKMB 1.6 2.4  CKMBINDEX -- --  TROPONINI 1.04* 1.06*   CBG: No results found for this basename: GLUCAP:5 in the last 168 hours  Iron Studies: No results found for this basename: IRON,TIBC,TRANSFERRIN,FERRITIN in the last 72 hours Studies/Results: Dg Chest 1 View  08/13/2011  *RADIOLOGY REPORT*  Clinical Data: Fluid overload.  Coronary artery disease.  CHEST - 1 VIEW  Comparison: CT chest 08/12/2011.  Two-view chest x-ray 08/12/2011.  Findings: The heart is mildly enlarged.  A diffuse interstitial pattern has increased slightly.  Bibasilar airspace disease likely reflects atelectasis.  IMPRESSION:  1.  Mild cardiomegaly with increasing interstitial edema and pulmonary vascular congestion. 2.  Mild bibasilar airspace disease likely reflects atelectasis.  Original Report Authenticated By: Jamesetta Orleans. MATTERN, M.D.   Dg Chest 2 View  08/12/2011  *RADIOLOGY REPORT*  Clinical Data: Nausea, vomiting.  Postop knee replacement. Elevated white count.  CHEST - 2 VIEW  Comparison: 08/02/2011  Findings: Heart is borderline in size.  Mild vascular congestion and interstitial prominence.  No confluent opacities or effusions. No acute bony abnormality.  IMPRESSION: New vascular congestion and interstitial prominence.  Cannot exclude early interstitial edema.  Original Report Authenticated By: Aubery Lapping  Kearney Hard, M.D.   Ct Angio Chest W/cm &/or Wo Cm  08/12/2011  *RADIOLOGY REPORT*  Clinical Data: Tachycardia, shortness of breath, total knee arthroplasty.  Resection of posterior mediastinal bronchogenic cyst 04/20/2005.  CT ANGIOGRAPHY CHEST  Technique:  Multidetector CT imaging of the chest using the standard protocol during bolus administration of intravenous contrast. Multiplanar reconstructed images including MIPs were obtained and  reviewed to evaluate the vascular anatomy.  Contrast: OMNIPAQUE IOHEXOL 300 MG/ML  SOLN  Comparison: Chest radiograph same date, chest CT 07/19/2005, chest MRI 04/20/2005  Findings: Images are degraded due to motion in the arms overlying the chest. The study is of adequate technical quality for evaluation for pulmonary embolism up to and including the 3rd order pulmonary arteries. No central focal filling defect is seen to suggest acute pulmonary embolism.  Heart size is upper limits of normal.  No pericardial effusion. Trace pleural effusions are noted with associated compressive atelectasis.  Clips are noted in the infrahilar region.  Fluid density mass like opacity is noted in the infrahilar region measuring 4.5 x 3.5 cm.  Previously, this measured 3.8 x 3.2 cm. No new lymphadenopathy.  Left humeral bone anchor noted.  Right glenohumeral degenerative changes.  No acute osseous abnormality.  Areas of curvilinear dependent atelectasis are noted.  Central airways are patent.  IMPRESSION: Increase in size of fluid density mass like opacity in the infrahilar region.  Given the history of surgery to this area, this could be postoperative, although recurrent bronchogenic cyst or new mass related to the esophagus is possible.  Trace pleural effusions and associated compressive atelectasis.  No other acute cardiopulmonary process, including pulmonary embolism.  Outpatient nonemergent chest MRI with contrast is recommended. These results will be called to the ordering clinician or representative by the Radiologist Assistant, and communication documented in the PACS Dashboard.  Original Report Authenticated By: Harrel Lemon, M.D.   Dg Abd Acute W/chest  08/12/2011  *RADIOLOGY REPORT*  Clinical Data: Abdominal pain, nausea and vomiting.  ACUTE ABDOMEN SERIES (ABDOMEN 2 VIEW & CHEST 1 VIEW)  Comparison: 07/10/2008  Findings: Cholecystectomy clips noted.  No free air identified. Leftward curvature of the lumbar  spine again noted.  No dilated gas filled loop of bowel.  No differential air-fluid level.  Mild cardiomegaly noted.  Central vascular congestion without overt edema.  Patchy retrocardiac possible airspace opacities noted. Trace right pleural effusion.  IMPRESSION: Nonobstructive bowel gas pattern.  Cardiomegaly with retrocardiac airspace opacity.  Please see dedicated report dictated separately.  Original Report Authenticated By: Harrel Lemon, M.D.   Medications:    . diltiazem (CARDIZEM) infusion 15 mg/hr (08/13/11 0800)  . DISCONTD: sodium chloride        . Chlorhexidine Gluconate Cloth  6 each Topical Q0600  . coumadin book   Does not apply Once  . diltiazem  10 mg Intravenous Once  . docusate sodium  100 mg Oral BID  . enoxaparin (LOVENOX) injection  40 mg Subcutaneous Q24H  . esomeprazole  40 mg Oral Q1200  . fesoterodine  8 mg Oral Daily  . furosemide  10 mg Intravenous Once  . gabapentin  600 mg Oral QHS  . levothyroxine  175 mcg Oral QAC breakfast  . metoprolol  5 mg Intravenous NOW  . metoprolol  5 mg Intravenous STAT  . metoprolol tartrate  12.5 mg Oral BID  . mupirocin ointment  1 application Nasal BID  . potassium chloride  40 mEq Oral Once  . simvastatin  20  mg Oral QHS  . sodium chloride  500 mL Intravenous Once  . warfarin  5 mg Oral Once  . warfarin   Does not apply Once  . Warfarin - Pharmacist Dosing Inpatient   Does not apply q1800  . DISCONTD: cefTRIAXone (ROCEPHIN)  IV  1 g Intravenous Q24H  . DISCONTD: ciprofloxacin  400 mg Intravenous Q12H  . DISCONTD: furosemide  20 mg Intravenous Once  . DISCONTD: furosemide  80 mg Intravenous BID  . DISCONTD: metronidazole  500 mg Intravenous Q8H  . DISCONTD: rivaroxaban  10 mg Oral Q breakfast    I  have reviewed scheduled and prn medications.     Problem/Plan: Principal Problem:  *OA (osteoarthritis) of knee Active Problems:  HYPERTENSION, UNSPECIFIED  CAROTID ARTERY DISEASE  Postop Hyponatremia   Postop Acute blood loss anemia  Postop Transfusion  Fever  Encephalopathy acute  Hypokalemia  Atrial fibrillation with RVR  Thrombocytopenia  Elevated troponin  1. S/P Left Total Knee Arthoplasty 5/15: Management per primary service. Will await their follow up today to see if the surgery or left knee could be a source of infection. 2. Febrile illness: unclear source. UA neg. CTA chest: no acute process. Has been spiking fevers since 5/15. Leukocytosis. Get Blood cultures. D/C Cipro and Flagyl(may be contributing to N/V). Start Vancomycin (MRSA screen +) and Aztreonam per Pharmacy.  3. Afib with RVR: pptd by fever, pain, ABLA,: Cardiology input appreciated. Treat underlying precipitating causes. IV Cardizen gtt and PO Metoprolol. On Coumadin. 4. Postop Acute posthemorrhagic Anemia (ABLA): S/P PRBC- Improved. Follow daily BMP. 5. Hypokalemia: Received Po KCL this am. F/U BMP, Mg  5/19. 6. Encephalopathy 2/2 to pain, pain meds, acute illness. No focal deficits. Minimize opioids. Control fevers. Monitor. 7. Thrombocytopenia: probably 2/2 ABLA, acute illness; Monitor. 8. Nausea and Vomiting: antiemetics, D/c Flagyl, minimize opioids and advance diet as tolerated. Continue PPI 9. Elevated Troponin: 2/2 to Demand ischemia from Rapid Afib, Fever, Anemia: Cardiology following. Echo when HR better controlled.  10. Hyponatremia: follow daily BMP. 11. HTN: reasonable control. 12. Fluid density mass like opacity in the infrahilar region on CTA chest, increasing in size compared to prior:? Enlarging bronchogenic cyst.? Pulmonary versus CTS consult. 13. Full Code: confirmed with Son Mr. Milana Obey who is at bedside.  Discussed patient's care at length with Son, Mr. Milana Obey and updated care and answered questions.  Addendum:  Patient was not on any anticoagulants prior to hospitalization. She was started on Xarelto post operatively for DVT prophylaxis and received it on 5/16 and 17. No clear reason  seen for switching from Xarelto to Lovenox/Coumadin. No indication of bleeding. It may be easier to use Xarelto due to ease of dosing and no need for frequent blood draws. Will request pharmacy to change to Xarelto and dose for A. fib which would also provide DVT prophylaxis. Discussed with cardiology who agree.  [name] [name] 08/13/2011,9:39 AM  LOS: 3 days

## 2011-08-13 NOTE — Progress Notes (Signed)
ANTICOAGULATION CONSULT NOTE - Follow Up Consult  Pharmacy Consult for Xarelto Indication: Afib  Allergies  Allergen Reactions  . Morphine   . Penicillins Itching    Patient Measurements: Height: 5' 5.5" (166.4 cm) Weight: 167 lb (75.751 kg) IBW/kg (Calculated) : 58.15    Vital Signs: Temp: 101 F (38.3 C) (05/18 1100) Temp src: Oral (05/18 1100) BP: 118/63 mmHg (05/18 1200) Pulse Rate: 79  (05/18 1200)  Labs:  Basename 08/13/11 0511 08/13/11 0150 08/12/11 2259 08/12/11 0355 08/11/11 0432  HGB -- 10.5* -- 7.6* --  HCT -- 31.5* -- 23.0* 29.3*  PLT -- 129* -- 112* 168  APTT -- -- -- -- --  LABPROT -- 19.9* -- -- --  INR -- 1.66* -- -- --  HEPARINUNFRC -- -- -- -- --  CREATININE -- 0.84 -- 0.79 0.78  CKTOTAL 200* -- 204* -- --  CKMB 1.6 -- 2.4 -- --  TROPONINI 1.04* -- 1.06* -- --    Estimated Creatinine Clearance: 56.8 ml/min (by C-G formula based on Cr of 0.84).   Medications:  Scheduled:     . aztreonam  2 g Intravenous Q8H  . Chlorhexidine Gluconate Cloth  6 each Topical Q0600  . coumadin book   Does not apply Once  . diltiazem  10 mg Intravenous Once  . docusate sodium  100 mg Oral BID  . enoxaparin (LOVENOX) injection  40 mg Subcutaneous Q24H  . esomeprazole  40 mg Oral Q1200  . fesoterodine  8 mg Oral Daily  . furosemide  10 mg Intravenous Once  . gabapentin  600 mg Oral QHS  . levothyroxine  175 mcg Oral QAC breakfast  . metoprolol  5 mg Intravenous NOW  . metoprolol  5 mg Intravenous STAT  . metoprolol tartrate  12.5 mg Oral BID  . mupirocin ointment  1 application Nasal BID  . potassium chloride  40 mEq Oral Once  . simvastatin  20 mg Oral QHS  . sodium chloride  500 mL Intravenous Once  . vancomycin  1,000 mg Intravenous Q12H  . warfarin  5 mg Oral Once  . warfarin   Does not apply Once  . Warfarin - Pharmacist Dosing Inpatient   Does not apply q1800  . DISCONTD: aztreonam  2 g Intramuscular Q8H  . DISCONTD: cefTRIAXone (ROCEPHIN)  IV  1 g  Intravenous Q24H  . DISCONTD: ciprofloxacin  400 mg Intravenous Q12H  . DISCONTD: furosemide  20 mg Intravenous Once  . DISCONTD: furosemide  80 mg Intravenous BID  . DISCONTD: metronidazole  500 mg Intravenous Q8H  . DISCONTD: rivaroxaban  10 mg Oral Q breakfast    Assessment:  80 YOF s/p TKA 5/15, was on Xarelto 10mg  daily x 2 doses post op (5/16-17 @ 0823 and 1119, respectively) for post op VTE ppx.   Changed to Lovenox/Warfarin 5/17  Lovenox 40mg  sq q24h, first dose given this morning (1100)  Warfarin 5mg  ordered last night but not given until ~0200 this am  INR 1.66 at 0200, not reflective of Coumadin since dose given at around the same time the lab was drawn; INR likely elevated d/t effects of Xarelto  CT shows no PE  No bleeding reported  Pt is now to be changed back to Xarelto but for the indication of Afib; recommended dose of Xarelto for Afib is 20mg  daily  Given that pt has received a dose of coumadin and a dose of Lovenox this morning in addition to two ppx doses of Xarelto 5/16  and 5/17, I don't feel she will need a dose of Xarelto for the indication of Afib until tomorrow evening.   Goal of Therapy:  Appropriate dosing of Xarelto   Plan:   D/C Lovenox  Xarelto 20mg  daily, 1st dose tomorrow at 1700  D/C Daily INR  Gwen Her PharmD  [phone] 08/13/2011 1:16 PM

## 2011-08-14 DIAGNOSIS — R509 Fever, unspecified: Secondary | ICD-10-CM

## 2011-08-14 DIAGNOSIS — E876 Hypokalemia: Secondary | ICD-10-CM

## 2011-08-14 DIAGNOSIS — D62 Acute posthemorrhagic anemia: Secondary | ICD-10-CM

## 2011-08-14 DIAGNOSIS — I4891 Unspecified atrial fibrillation: Secondary | ICD-10-CM

## 2011-08-14 LAB — BASIC METABOLIC PANEL
BUN: 27 mg/dL — ABNORMAL HIGH (ref 6–23)
Creatinine, Ser: 0.88 mg/dL (ref 0.50–1.10)
GFR calc Af Amer: 71 mL/min — ABNORMAL LOW (ref >90)
GFR calc non Af Amer: 61 mL/min — ABNORMAL LOW (ref >90)
Glucose, Bld: 108 mg/dL — ABNORMAL HIGH (ref 70–99)

## 2011-08-14 LAB — CBC
HCT: 24.7 % — ABNORMAL LOW (ref 36.0–46.0)
Hemoglobin: 8.5 g/dL — ABNORMAL LOW (ref 12.0–15.0)
MCH: 29 pg (ref 26.0–34.0)
MCHC: 34.4 g/dL (ref 30.0–36.0)
MCV: 84.3 fL (ref 78.0–100.0)
RDW: 15.3 % (ref 11.5–15.5)

## 2011-08-14 LAB — DIFFERENTIAL
Basophils Absolute: 0 10*3/uL (ref 0.0–0.1)
Basophils Relative: 0 % (ref 0–1)
Eosinophils Absolute: 0 10*3/uL (ref 0.0–0.7)
Lymphocytes Relative: 8 % — ABNORMAL LOW (ref 12–46)
Lymphs Abs: 1.4 10*3/uL (ref 0.7–4.0)
Neutro Abs: 12.8 10*3/uL — ABNORMAL HIGH (ref 1.7–7.7)

## 2011-08-14 LAB — URINE CULTURE

## 2011-08-14 MED ORDER — POTASSIUM CHLORIDE IN NACL 20-0.9 MEQ/L-% IV SOLN
INTRAVENOUS | Status: AC
  Administered 2011-08-14: 15:00:00 via INTRAVENOUS
  Filled 2011-08-14: qty 1000

## 2011-08-14 MED ORDER — ONDANSETRON HCL 4 MG/2ML IJ SOLN
4.0000 mg | Freq: Four times a day (QID) | INTRAMUSCULAR | Status: DC
  Administered 2011-08-14 – 2011-08-18 (×16): 4 mg via INTRAVENOUS
  Filled 2011-08-14 (×14): qty 2

## 2011-08-14 MED ORDER — SODIUM CHLORIDE 0.9 % IV SOLN
80.0000 mg | INTRAVENOUS | Status: DC
  Administered 2011-08-14 – 2011-08-18 (×5): 80 mg via INTRAVENOUS
  Filled 2011-08-14 (×7): qty 80

## 2011-08-14 MED ORDER — PROMETHAZINE HCL 25 MG/ML IJ SOLN: 12.5000 mg | Freq: Once | INTRAMUSCULAR | Status: DC

## 2011-08-14 MED ORDER — ONDANSETRON HCL 4 MG/2ML IJ SOLN
4.0000 mg | Freq: Three times a day (TID) | INTRAMUSCULAR | Status: DC | PRN
  Administered 2011-08-15: 4 mg via INTRAVENOUS
  Filled 2011-08-14 (×3): qty 2

## 2011-08-14 NOTE — Progress Notes (Signed)
Subjective:   Patient complains of feeling bad. Nausea persists. Complaints of nonspecific abdominal/chest pain. According to nursing, patient has been coherent and has been going in and out of A. fib.  Objective  Vital signs in last 24 hours: Filed Vitals:   08/14/11 0400 08/14/11 0800 08/14/11 0835 08/14/11 0840  BP: 115/49  136/57 119/49  Pulse: 87  133 107  Temp: 100 F (37.8 C) 99.1 F (37.3 C)    TempSrc: Oral Oral    Resp: 23     Height:      Weight:      SpO2: 100%  100%    Weight change:   Intake/Output Summary (Last 24 hours) at 08/14/11 1156 Last data filed at 08/14/11 0400  Gross per 24 hour  Intake    915 ml  Output    750 ml  Net    165 ml    Physical Exam:  General Exam: Comfortable.  Respiratory System: Clear. No increased work of breathing.  Cardiovascular System: First and second heart sounds heard. Irregularly irregular and mildly tachycardic. No JVD/murmurs/pedal edema. Telemetry shows sinus rhythm with occasional PVCs at this time. Gastrointestinal System: Abdomen is non distended, soft and normal bowel sounds heard. Non Tender.  Central Nervous System: Alert and oriented. No focal neurological deficits. Extremities: Left knee surgical site shows mild redness, significant bruising especially on the medial aspect and one or 2 water blisters. Neck: Supple.  Labs:  Basic Metabolic Panel:  Lab 08/14/11 1610 08/13/11 0150 08/12/11 0355  NA 124* 129* 132*  K 3.6 3.0* 3.9  CL 88* 88* 100  CO2 25 27 24   GLUCOSE 108* 136* 160*  BUN 27* 14 13  CREATININE 0.88 0.84 0.79  CALCIUM 7.9* 8.5 8.1*  ALB -- -- --  PHOS -- -- --   Liver Function Tests: No results found for this basename: AST:3,ALT:3,ALKPHOS:3,BILITOT:3,PROT:3,ALBUMIN:3 in the last 168 hours No results found for this basename: LIPASE:3,AMYLASE:3 in the last 168 hours No results found for this basename: AMMONIA:3 in the last 168 hours CBC:  Lab 08/14/11 0341 08/13/11 0150 08/12/11 0355  08/11/11 0432  WBC 16.9* 21.3* 22.4* --  NEUTROABS 12.8* -- -- --  HGB 8.5* 10.5* 7.6* --  HCT 24.7* 31.5* 23.0* --  MCV 84.3 85.6 89.8 91.8  PLT 119* 129* 112* --   Cardiac Enzymes:  Lab 08/13/11 1320 08/13/11 0511 08/12/11 2259  CKTOTAL 153 200* 204*  CKMB 1.6 1.6 2.4  CKMBINDEX -- -- --  TROPONINI 0.62* 1.04* 1.06*   CBG: No results found for this basename: GLUCAP:5 in the last 168 hours  Iron Studies: No results found for this basename: IRON,TIBC,TRANSFERRIN,FERRITIN in the last 72 hours Studies/Results: Dg Chest 1 View  08/13/2011  *RADIOLOGY REPORT*  Clinical Data: Fluid overload.  Coronary artery disease.  CHEST - 1 VIEW  Comparison: CT chest 08/12/2011.  Two-view chest x-ray 08/12/2011.  Findings: The heart is mildly enlarged.  A diffuse interstitial pattern has increased slightly.  Bibasilar airspace disease likely reflects atelectasis.  IMPRESSION:  1.  Mild cardiomegaly with increasing interstitial edema and pulmonary vascular congestion. 2.  Mild bibasilar airspace disease likely reflects atelectasis.  Original Report Authenticated By: Jamesetta Orleans. MATTERN, M.D.   Ct Angio Chest W/cm &/or Wo Cm  08/12/2011  *RADIOLOGY REPORT*  Clinical Data: Tachycardia, shortness of breath, total knee arthroplasty.  Resection of posterior mediastinal bronchogenic cyst 04/20/2005.  CT ANGIOGRAPHY CHEST  Technique:  Multidetector CT imaging of the chest using the standard protocol during  bolus administration of intravenous contrast. Multiplanar reconstructed images including MIPs were obtained and reviewed to evaluate the vascular anatomy.  Contrast: OMNIPAQUE IOHEXOL 300 MG/ML  SOLN  Comparison: Chest radiograph same date, chest CT 07/19/2005, chest MRI 04/20/2005  Findings: Images are degraded due to motion in the arms overlying the chest. The study is of adequate technical quality for evaluation for pulmonary embolism up to and including the 3rd order pulmonary arteries. No central  focal filling defect is seen to suggest acute pulmonary embolism.  Heart size is upper limits of normal.  No pericardial effusion. Trace pleural effusions are noted with associated compressive atelectasis.  Clips are noted in the infrahilar region.  Fluid density mass like opacity is noted in the infrahilar region measuring 4.5 x 3.5 cm.  Previously, this measured 3.8 x 3.2 cm. No new lymphadenopathy.  Left humeral bone anchor noted.  Right glenohumeral degenerative changes.  No acute osseous abnormality.  Areas of curvilinear dependent atelectasis are noted.  Central airways are patent.  IMPRESSION: Increase in size of fluid density mass like opacity in the infrahilar region.  Given the history of surgery to this area, this could be postoperative, although recurrent bronchogenic cyst or new mass related to the esophagus is possible.  Trace pleural effusions and associated compressive atelectasis.  No other acute cardiopulmonary process, including pulmonary embolism.  Outpatient nonemergent chest MRI with contrast is recommended. These results will be called to the ordering clinician or representative by the Radiologist Assistant, and communication documented in the PACS Dashboard.  Original Report Authenticated By: Harrel Lemon, M.D.   Dg Abd Acute W/chest  08/12/2011  *RADIOLOGY REPORT*  Clinical Data: Abdominal pain, nausea and vomiting.  ACUTE ABDOMEN SERIES (ABDOMEN 2 VIEW & CHEST 1 VIEW)  Comparison: 07/10/2008  Findings: Cholecystectomy clips noted.  No free air identified. Leftward curvature of the lumbar spine again noted.  No dilated gas filled loop of bowel.  No differential air-fluid level.  Mild cardiomegaly noted.  Central vascular congestion without overt edema.  Patchy retrocardiac possible airspace opacities noted. Trace right pleural effusion.  IMPRESSION: Nonobstructive bowel gas pattern.  Cardiomegaly with retrocardiac airspace opacity.  Please see dedicated report dictated separately.   Original Report Authenticated By: Harrel Lemon, M.D.   Medications:    . diltiazem (CARDIZEM) infusion 5 mg/hr (08/14/11 0400)      . atorvastatin  10 mg Oral QHS  . aztreonam  2 g Intravenous Q8H  . Chlorhexidine Gluconate Cloth  6 each Topical Q0600  . docusate sodium  100 mg Oral BID  . fesoterodine  8 mg Oral Daily  . gabapentin  600 mg Oral QHS  . levothyroxine  175 mcg Oral QAC breakfast  . metoprolol tartrate  12.5 mg Oral BID  . mupirocin ointment  1 application Nasal BID  . ondansetron (ZOFRAN) IV  4 mg Intravenous Q6H  . pantoprazole (PROTONIX) IV  80 mg Intravenous Q24H  . rivaroxaban  20 mg Oral Q supper  . sodium chloride  500 mL Intravenous Once  . vancomycin  1,000 mg Intravenous Q12H  . DISCONTD: coumadin book   Does not apply Once  . DISCONTD: enoxaparin (LOVENOX) injection  40 mg Subcutaneous Q24H  . DISCONTD: esomeprazole  40 mg Oral Q1200  . DISCONTD: promethazine  12.5 mg Intravenous Once  . DISCONTD: simvastatin  20 mg Oral QHS  . DISCONTD: warfarin   Does not apply Once  . DISCONTD: Warfarin - Pharmacist Dosing Inpatient   Does not  apply q1800    I  have reviewed scheduled and prn medications.     Problem/Plan: Principal Problem:  *OA (osteoarthritis) of knee Active Problems:  HYPERTENSION, UNSPECIFIED  CAROTID ARTERY DISEASE  Postop Hyponatremia  Postop Acute blood loss anemia  Postop Transfusion  Fever  Encephalopathy acute  Hypokalemia  Atrial fibrillation with RVR  Thrombocytopenia  Elevated troponin  1. S/P Left Total Knee Arthoplasty 5/15: Management per primary service. Orthopedics do not believe that left knee is a source of infection. 2. Febrile illness: unclear source. UA neg. CTA chest: no acute process. Seems to be defervescing. Blood cultures are negative to date. Vancomycin (MRSA screen +) and Aztreonam per Pharmacy (started 5/18).  3. Afib with RVR: pptd by fever, pain, ABLA,: Patient going in and out of A. fib.  Inconsistent by mouth intake. Continue Cardizem at 5 mg per hour and by mouth metoprolol. On Xarelto. 4. Aute posthemorrhagic Anemia (ABLA): S/P PRBC- hemoglobin has dropped again today. No overt bleeding. Followup CBCs tomorrow. Transfuse if less than 7 g/dL. 5. Hypokalemia: Repleted. 6. Encephalopathy 2/2 to pain, pain meds, acute illness. No focal deficits. Seems to have resolved. Monitor 7. Thrombocytopenia: probably 2/2 ABLA, acute illness; Monitor. 8. Nausea and Vomiting: Will do scheduled Zofran, change PPI to IV and monitor. May have to consider other medications if not better, such as Reglan. No clinical ileus and KUB on 5/17 was unremarkable. Unclear etiology.? Secondary to pain and acute illness. Has not been getting opioids. 9. Elevated Troponin: 2/2 to Demand ischemia from Rapid Afib, Fever, Anemia: Cardiology following. We'll order echocardiogram. 10. Hyponatremia: Worse than yesterday.? Secondary to poor oral intake with associated dehydration and diuretics. Diuretics were discontinued. Will briefly treat with IV fluids and follow BMP. 11. HTN: reasonable control. 12. Fluid density mass like opacity in the infrahilar region on CTA chest, increasing in size compared to prior:? Enlarging bronchogenic cyst.? Pulmonary versus CTS consult. 13. Full Code: confirmed with Son Mr. Milana Obey on 5/18.   Thaer Miyoshi 08/14/2011,11:56 AM  LOS: 4 days

## 2011-08-14 NOTE — Progress Notes (Signed)
Subjective: Procedure(s) (LRB): TOTAL KNEE ARTHROPLASTY (Left) 4 Days Post-Op  Patient reports pain as 5 on 0-10 scale.  Reports unchanged leg pain Foley in place - UA negative Negative bowel movement Negative flatus Positive chest pain or shortness of breath  Objective: Vital signs in last 24 hours: Temp:  [99.1 F (37.3 C)-101.5 F (38.6 C)] 99.1 F (37.3 C) (05/19 0800) Pulse Rate:  [79-136] 87  (05/19 0400) Resp:  [21-30] 23  (05/19 0400) BP: (99-134)/(49-63) 115/49 mmHg (05/19 0400) SpO2:  [96 %-100 %] 100 % (05/19 0400)  Intake/Output from previous day: 05/18 0701 - 05/19 0700 In: 1185 [P.O.:100; I.V.:290; IV Piggyback:795] Out: 750 [Urine:750]   Basename 08/14/11 0341 08/13/11 0150  WBC 16.9* 21.3*  RBC 2.93* 3.68*  HCT 24.7* 31.5*  PLT 119* 129*    Basename 08/14/11 0341 08/13/11 0150  NA 124* 129*  K 3.6 3.0*  CL 88* 88*  CO2 25 27  BUN 27* 14  CREATININE 0.88 0.84  GLUCOSE 108* 136*  CALCIUM 7.9* 8.5    Basename 08/13/11 0150  LABPT --  INR 1.66*    Neurologically intact  Assessment/Plan: Patient stable - Internal Medicine evaluating chest pain/discomfort Continue mobilization with physical therapy Continue care  Up with therapy if able.  Bowel regimen for constipation Appreciate medical input.    Venita Lick, MD 08/14/2011, 8:57 AM

## 2011-08-14 NOTE — Progress Notes (Signed)
Patient preceded to take scheduled PO meds as well as PRN anxiety and pain meds PO. Within a matter of minutes patient had a vomiting episode X3. Vomit was clear and there were whole meds in the emesis basin. PRN Zofran IV given. Will continue to monitor.

## 2011-08-14 NOTE — Progress Notes (Signed)
Physical Therapy Treatment Patient Details Name: [name] [name] MRN: [id number] DOB: September 09, 1930 Today's Date: 08/14/2011 Time: 3244-0102 PT Time Calculation (min): 40 min  PT Assessment / Plan / Recommendation Comments on Treatment Session  78 y.o. female s/p L TKA who has been in ICU with cardiac issues.  PT has been cancelled for past 2 days due to nausea and cardiac issues. Pt eager to increase mobility. With scooting up in bed her HR increased to 130, RN notified and increased Cardizem rate, HR then decreased to 90's. Assisted pt to sitting on EOB, pt reported dizziness and had drop in BP from 136/57 in supine to 119/49 in sitting. Pt sat on EOB 5 min. without improvement in dizziness. REturned pt to supine. Transfers/gait deferred due to orthostatic BP. Pt quite fatigued at end of tx.       Follow Up Recommendations  Skilled nursing facility    Barriers to Discharge        Equipment Recommendations  Defer to next venue    Recommendations for Other Services OT consult  Frequency 7X/week   Plan Discharge plan remains appropriate    Precautions / Restrictions Precautions Precautions: Knee Required Braces or Orthoses: Knee Immobilizer - Left Knee Immobilizer - Left: Discontinue once straight leg raise with < 10 degree lag Restrictions Weight Bearing Restrictions: No Other Position/Activity Restrictions: WBAT   Pertinent Vitals/Pain *9/10 L knee; RN aware, ice applied, no pain meds given due to nausea/vomiting**    Mobility  Bed Mobility Supine to Sit: 1: +2 Total assist Supine to Sit: Patient Percentage: 20% Sitting - Scoot to Edge of Bed: 1: +2 Total assist Sitting - Scoot to Edge of Bed: Patient Percentage: 20% Sit to Supine: 1: +2 Total assist Sit to Supine: Patient Percentage: 20% Details for Bed Mobility Assistance: assist to advance LEs and to elevate trunk Transfers Details for Transfer Assistance: deferred 2* dizziness & orthostatic BP in  sitting Ambulation/Gait Ambulation/Gait Assistance Details: deferred    Exercises Total Joint Exercises Ankle Circles/Pumps: AROM;Both;15 reps;Supine Quad Sets: AROM;Both;10 reps;Supine Heel Slides: AAROM;Left;10 reps;Supine Hip ABduction/ADduction: AAROM;Left;10 reps;Supine Goniometric ROM: L knee flexion approx 20*, knee very stiff and ROM limited by pain, pt unable to take pain meds due to nausea/vomiting; R knee flexion AAROM also limited by pain to approx 35*    PT Diagnosis:    PT Problem List:   PT Treatment Interventions:     PT Goals Acute Rehab PT Goals PT Goal Formulation: With patient Time For Goal Achievement: 08/16/11 Potential to Achieve Goals: Fair Pt will go Supine/Side to Sit: with mod assist PT Goal: Supine/Side to Sit - Progress: Revised due to lack of progress Pt will go Sit to Supine/Side: with min assist;with mod assist PT Goal: Sit to Supine/Side - Progress: Revised due to lack of progress Pt will go Sit to Stand: with mod assist PT Goal: Sit to Stand - Progress: Not met Pt will go Stand to Sit: with mod assist PT Goal: Stand to Sit - Progress: Not met Pt will Transfer Bed to Chair/Chair to Bed: with mod assist PT Transfer Goal: Bed to Chair/Chair to Bed - Progress: Not met Pt will Ambulate: with mod assist;1 - 15 feet PT Goal: Ambulate - Progress: Not met  Visit Information  Last PT Received On: 08/14/11 Assistance Needed: +2    Subjective Data  Subjective: I'll try my best, I'm a fighter. Patient Stated Goal: to get moving    Cognition  Overall Cognitive Status: Appears within functional limits  for tasks assessed/performed Arousal/Alertness: Awake/alert Orientation Level: Appears intact for tasks assessed Behavior During Session: [facility] for tasks performed Cognition - Other Comments: mild confusion, but able to follow directions and interact appropriately    Balance     End of Session PT - End of Session Equipment Utilized During Treatment:  Left knee immobilizer Activity Tolerance: Patient limited by pain;Treatment limited secondary to medical complications (Comment);Patient limited by fatigue Patient left: in bed Nurse Communication: Mobility status CPM Left Knee CPM Left Knee: Off    Ralene Bathe Kistler 08/14/2011, 9:11 AM [phone]

## 2011-08-15 DIAGNOSIS — D62 Acute posthemorrhagic anemia: Secondary | ICD-10-CM

## 2011-08-15 DIAGNOSIS — I059 Rheumatic mitral valve disease, unspecified: Secondary | ICD-10-CM

## 2011-08-15 DIAGNOSIS — I4891 Unspecified atrial fibrillation: Secondary | ICD-10-CM

## 2011-08-15 DIAGNOSIS — R7989 Other specified abnormal findings of blood chemistry: Secondary | ICD-10-CM

## 2011-08-15 DIAGNOSIS — R509 Fever, unspecified: Secondary | ICD-10-CM

## 2011-08-15 DIAGNOSIS — E876 Hypokalemia: Secondary | ICD-10-CM

## 2011-08-15 DIAGNOSIS — R9389 Abnormal findings on diagnostic imaging of other specified body structures: Secondary | ICD-10-CM | POA: Diagnosis present

## 2011-08-15 LAB — CBC
HCT: 22.5 % — ABNORMAL LOW (ref 36.0–46.0)
MCH: 29.2 pg (ref 26.0–34.0)
MCV: 85.2 fL (ref 78.0–100.0)
Platelets: 146 10*3/uL — ABNORMAL LOW (ref 150–400)
RBC: 2.64 MIL/uL — ABNORMAL LOW (ref 3.87–5.11)
RDW: 15.1 % (ref 11.5–15.5)
WBC: 11.8 10*3/uL — ABNORMAL HIGH (ref 4.0–10.5)

## 2011-08-15 LAB — BASIC METABOLIC PANEL
BUN: 30 mg/dL — ABNORMAL HIGH (ref 6–23)
CO2: 25 mEq/L (ref 19–32)
Calcium: 7.9 mg/dL — ABNORMAL LOW (ref 8.4–10.5)
Chloride: 91 mEq/L — ABNORMAL LOW (ref 96–112)
Creatinine, Ser: 0.86 mg/dL (ref 0.50–1.10)

## 2011-08-15 LAB — VANCOMYCIN, TROUGH: Vancomycin Tr: 18.5 ug/mL (ref 10.0–20.0)

## 2011-08-15 LAB — HEMOGLOBIN AND HEMATOCRIT, BLOOD
HCT: 30.1 % — ABNORMAL LOW (ref 36.0–46.0)
Hemoglobin: 10.2 g/dL — ABNORMAL LOW (ref 12.0–15.0)

## 2011-08-15 LAB — PREPARE RBC (CROSSMATCH)

## 2011-08-15 MED ORDER — ACETAMINOPHEN 10 MG/ML IV SOLN
1000.0000 mg | Freq: Once | INTRAVENOUS | Status: AC
  Administered 2011-08-15: 1000 mg via INTRAVENOUS
  Filled 2011-08-15: qty 100

## 2011-08-15 MED ORDER — DILTIAZEM HCL ER COATED BEADS 120 MG PO CP24
120.0000 mg | ORAL_CAPSULE | Freq: Every day | ORAL | Status: DC
  Administered 2011-08-15 – 2011-08-18 (×4): 120 mg via ORAL
  Filled 2011-08-15 (×4): qty 1

## 2011-08-15 MED ORDER — HYDROCODONE-ACETAMINOPHEN 5-325 MG PO TABS
1.0000 | ORAL_TABLET | ORAL | Status: DC | PRN
  Administered 2011-08-15 – 2011-08-16 (×3): 1 via ORAL
  Filled 2011-08-15 (×3): qty 1

## 2011-08-15 MED ORDER — RIVAROXABAN 10 MG PO TABS: 10.0000 mg | ORAL_TABLET | Freq: Every day | ORAL | Status: DC

## 2011-08-15 MED ORDER — RIVAROXABAN 10 MG PO TABS
20.0000 mg | ORAL_TABLET | Freq: Every day | ORAL | Status: DC
  Filled 2011-08-15: qty 2

## 2011-08-15 MED ORDER — POTASSIUM CHLORIDE IN NACL 20-0.9 MEQ/L-% IV SOLN
INTRAVENOUS | Status: DC
  Administered 2011-08-15: 50 mL/h via INTRAVENOUS
  Administered 2011-08-16 – 2011-08-17 (×2): via INTRAVENOUS
  Filled 2011-08-15 (×5): qty 1000

## 2011-08-15 MED ORDER — HYDROMORPHONE HCL PF 1 MG/ML IJ SOLN
0.5000 mg | Freq: Once | INTRAMUSCULAR | Status: AC
  Administered 2011-08-15: 0.5 mg via INTRAVENOUS

## 2011-08-15 MED ORDER — RIVAROXABAN 10 MG PO TABS
20.0000 mg | ORAL_TABLET | Freq: Every day | ORAL | Status: DC
  Administered 2011-08-15 – 2011-08-18 (×4): 20 mg via ORAL
  Filled 2011-08-15 (×4): qty 2

## 2011-08-15 MED ORDER — POTASSIUM CHLORIDE CRYS ER 20 MEQ PO TBCR
40.0000 meq | EXTENDED_RELEASE_TABLET | Freq: Once | ORAL | Status: AC
  Administered 2011-08-15: 40 meq via ORAL
  Filled 2011-08-15: qty 2

## 2011-08-15 MED ORDER — HYDROMORPHONE HCL PF 1 MG/ML IJ SOLN
INTRAMUSCULAR | Status: AC
  Filled 2011-08-15: qty 1

## 2011-08-15 NOTE — Progress Notes (Addendum)
Appreciate GI consult. Doubtful for GI source for drop in HGB and no need for scope.  Will resume Xarelto for now as was previously ordered (20 mg).  She is still having some pain. She has been using Ultram and Robaxin.  Family in room at this time and state that she has been on Vicodin at home and tolerated it well.  Will start a low dose Vicodin for break thru pain.      Avel Peace, PA-C

## 2011-08-15 NOTE — Consult Note (Signed)
Name: [name] [name] MRN: [id number] DOB: 1931/03/27    LOS: 5  Referring Provider:  Dr. Waymon Amato Reason for Referral:  Dyspnea, chest pain PCP:  Dr. Starling Manns Card:  Dr. Andee Lineman PULMONARY / CRITICAL CARE MEDICINE  HPI:  78 y/o F with PMH of HLD, CAD, Hypothyroidism, Afib, Bronchogenic cyst removal 4/07 admitted on 5/15 for L total knee arthroplasty.  5/16 noted to have sharp pain in her chest and anemia.  CTA of chest negative for PE but noted an increase in fluid density mass like opacity in infrahilar region.   Rx'd with PRBC's and 500 ml NS for anemia (hgb7.6). Following day had bilateral airspace disease on CXR.  She subsequently developed Afib with RVR on 5/17 and transferred to ICU.   Pt was diuresed and Cardiology consulted.  5/18 noted to have intermittent fevers (102.6) and confusion and episodes of vomiting.  5/20 Chest pain continued/reproducable with palpation.  PCCM consulted 5/20 for assistance with mass like opacity on CTA, SOB, hypoxemia.      Past Medical History  Diagnosis Date  . Other and unspecified hyperlipidemia   . Carotid artery disease     50-69% stenosis R.ICA,12.11        4/13 carotid dopplar EPIC  . Hypothyroidism   . Anemia   . GERD (gastroesophageal reflux disease)   . Arthritis   . Atrial fibrillation 4/07    Remote h/o amiodarone use, recurred in 07/2011 post op from knee arthroplasty  . Shortness of breath 4/07    with  walking long distances since removal bronchogenic cyst- states no changes  . Unspecified essential hypertension    Past Surgical History  Procedure Date  . Tonsillectomy   . Cholecystectomy   . Appendectomy   . Thoracotomy 4/07    removal bronchogenic cyst  . Total knee arthroplasty 08/10/2011    Procedure: TOTAL KNEE ARTHROPLASTY;  Surgeon: Loanne Drilling, MD;  Location: [facility];  Service: Orthopedics;  Laterality: Left;   Prior to Admission medications   Medication Sig Start Date End Date Taking? Authorizing Provider  ALPRAZolam  Prudy Feeler) 0.5 MG tablet Take 0.5 mg by mouth 3 (three) times daily as needed. anxiety   Yes Historical Provider, MD  conjugated estrogens (PREMARIN) vaginal cream Place vaginally 2 (two) times daily.   Yes Historical Provider, MD  esomeprazole (NEXIUM) 40 MG capsule Take 40 mg by mouth daily.    Yes Historical Provider, MD  gabapentin (NEURONTIN) 300 MG capsule Take 2 capsules by mouth at bedtime. & at bedtime 01/04/11  Yes Historical Provider, MD  HYDROcodone-acetaminophen (VICODIN) 5-500 MG per tablet Take 1 tablet by mouth every 6 (six) hours as needed.    Yes Historical Provider, MD  levothyroxine (SYNTHROID, LEVOTHROID) 175 MCG tablet Take 175 mcg by mouth daily.    Yes Historical Provider, MD  simvastatin (ZOCOR) 20 MG tablet Take 20 mg by mouth daily.    Yes Historical Provider, MD  tolterodine (DETROL LA) 4 MG 24 hr capsule Take 4 mg by mouth daily.    Yes Historical Provider, MD  aspirin EC 81 MG tablet Take 81 mg by mouth daily.     Historical Provider, MD  Calcium Carbonate (CALCIUM 600 PO) Take 1 tablet by mouth daily.    Historical Provider, MD  Cholecalciferol (VITAMIN D3) 1000 UNITS CAPS Take 1 capsule by mouth daily.     Historical Provider, MD  fish oil-omega-3 fatty acids 1000 MG capsule Take 1 g by mouth daily.  Historical Provider, MD  Garlic Oil 1000 MG CAPS Take 1 capsule by mouth daily.     Historical Provider, MD  IRON CR PO Take 1 tablet by mouth daily.     Historical Provider, MD  Multiple Vitamin (MULTIVITAMIN) tablet Take 1 tablet by mouth daily.     Historical Provider, MD  oxaprozin (DAYPRO) 600 MG tablet Take 600 mg by mouth 2 (two) times daily.     Historical Provider, MD  vitamin A 8000 UNIT capsule Take 8,000 Units by mouth daily.     Historical Provider, MD  vitamin C (ASCORBIC ACID) 500 MG tablet Take 500 mg by mouth daily.     Historical Provider, MD  zinc gluconate 50 MG tablet Take 50 mg by mouth daily.    Historical Provider, MD   Allergies Allergies    Allergen Reactions  . Morphine   . Penicillins Itching    Family History Family History  Problem Relation Age of Onset  . Cancer    . Stroke    . Diabetes    . Hypertension     Social History  reports that she has never smoked. She has never used smokeless tobacco. She reports that she does not drink alcohol or use illicit drugs.  Review Of Systems:  Gen: Denies fever, chills, weight change, fatigue, night sweats HEENT: Denies blurred vision, double vision, hearing loss, tinnitus, sinus congestion, rhinorrhea, sore throat, neck stiffness, dysphagia PULM: Denies shortness of breath, hemoptysis, wheezing.  Minimal cough with clear production CV: Denies chest pain, edema, orthopnea, paroxysmal nocturnal dyspnea, palpitations GI: Denies abdominal pain, nausea, vomiting, diarrhea, hematochezia, melena, constipation, change in bowel habits GU: Denies dysuria, hematuria, polyuria, oliguria, urethral discharge Endocrine: Denies hot or cold intolerance, polyuria, polyphagia or appetite change Derm: Denies rash, dry skin, scaling or peeling skin change Heme: Denies easy bruising, bleeding, bleeding gums Neuro: Denies headache, numbness, weakness, slurred speech, loss of memory or consciousness     Vital Signs: Temp:  [98.3 F (36.8 C)-98.9 F (37.2 C)] 98.4 F (36.9 C) (05/20 0800) Pulse Rate:  [36-77] 65  (05/20 1200) Resp:  [13-25] 16  (05/20 1200) BP: (106-145)/(41-69) 129/62 mmHg (05/20 1200) SpO2:  [99 %-100 %] 100 % (05/20 1200)  Physical Examination: General:  Elderly female in NAD Neuro:  AAOx4, speech clear, MAE HEENT:  Mm pink/dry Neck:  Supple without masses, LAN Cardiovascular:  s1s2 rrr, no m/r/g Lungs:  resp's even/non-labored, lungs bilaterally clear Abdomen:  Round/soft, bsx4 active Musculoskeletal:  CPM on L leg, bruising to L knee Skin:  Warm/dry, no edema  Principal Problem:  *OA (osteoarthritis) of knee Active Problems:  HYPERTENSION, UNSPECIFIED   CAROTID ARTERY DISEASE  Postop Hyponatremia  Postop Acute blood loss anemia  Postop Transfusion  Fever  Encephalopathy acute  Hypokalemia  Atrial fibrillation with RVR  Thrombocytopenia  Elevated troponin   ASSESSMENT AND PLAN  PULMONARY  CXR:  None.   CTA Chest 5/20:  Increase in size of fluid density mass like opacity in the infrahilar region. Given the history of surgery to this area, this could be postoperative, although recurrent bronchogenic cyst or new mass related to the esophagus is possible. Trace pleural effusions and associated compressive atelectasis. No other acute cardiopulmonary process, including pulmonary embolism.   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  07/12/05 underwent R VATS for resection of mass which demonstrated only mesothelial cells, no evidence of malignancy.  At  that time mass compressed R mainstem bronchus.     P:   -abx per Primary SVC -O2 to keep saturations greater 90% -pain may have some musculoskeletal component as can be reproduced with palpation -No indication for intervention @ this time. Unlikley to be explanation for drop in Hct  CARDIOVASCULAR  Lab 08/13/11 1320 08/13/11 0511 08/12/11 2259  TROPONINI 0.62* 1.04* 1.06*  LATICACIDVEN -- -- --  PROBNP -- -- --   ECG:  Current SR in 60's  A:  Atrial fibrillation with RVR Elevated troponin Anticoagulation  P:  -Recommendations per Cardiology  RENAL- no acute issues.  Lab 08/15/11 0338 08/14/11 0341 08/13/11 0150 08/12/11 0355 08/11/11 0432  NA 125* 124* 129* 132* 136  K 3.6 3.6 -- -- --  CL 91* 88* 88* 100 102  CO2 25 25 27 24 26   BUN 30* 27* 14 13 10   CREATININE 0.86 0.88 0.84 0.79 0.78  CALCIUM 7.9* 7.9* 8.5 8.1* 8.5  MG -- 1.8 -- -- --  PHOS -- -- -- -- --   Intake/Output      05/19 0701 - 05/20 0700 05/20 0701 - 05/21 0700   P.O.     I.V. (mL/kg) 706.3 (9.3)    Blood  12.5    IV Piggyback 762 200   Total Intake(mL/kg) 1468.3 (19.4) 212.5 (2.8)   Urine (mL/kg/hr) 1000 (0.6) 50 (0.1)   Total Output 1000 50   Net +468.3 +162.5           GASTROINTESTINAL  A:   Abdominal Pain Nausea / Vomiting  P:   -per primary SVC  HEMATOLOGIC  Lab 08/15/11 0338 08/14/11 0341 08/13/11 0150 08/12/11 0355 08/11/11 0432  HGB 7.7* 8.5* 10.5* 7.6* 9.4*  HCT 22.5* 24.7* 31.5* 23.0* 29.3*  PLT 146* 119* 129* 112* 168  INR -- -- 1.66* -- --  APTT -- -- -- -- --   A:   Anemia -likely post-surgical S/p transfusion PRBC's  P:  -per Primary SVC -consider stool for FOB   INFECTIOUS  Lab 08/15/11 0338 08/14/11 0341 08/13/11 0150 08/12/11 0355 08/11/11 0432  WBC 11.8* 16.9* 21.3* 22.4* 21.8*  PROCALCITON -- -- -- -- --   Cultures: 5/18 BCx2>>>  Antibiotics: Azactam 5/18>>> Vanco 5/16>>> Rocephin 5/17>>>5/17 Cipro 5/17>>>5/18  A:   Febrile Illness of unclear etiology  P:   -Abx per Primary SVC -likely can narrow / d/c as fever curve improved and cultures to date are negative     Canary Brim, NP-C [facility] Pgr: [phone]    08/15/2011, 12:31 PM    Seen on CCM rounds this morning with resident MD or ACNP above.  Pt examined and database reviewed. I agree with above findings, assessment and plan as reflected in the note above which I have edited. Her CT chest findings are probably unrelated to the observed drop in Hct. There is no need for any intervention for what is likely a recurrent bronchogenic cyst or persistent post-operative changes related to prior resection. Her medical problems are well-addressed by [facility]. PCCM will sign off. Please call if we can be of further assistance  Billy Fischer, MD;  PCCM service; Mobile [phone]

## 2011-08-15 NOTE — Progress Notes (Signed)
Subjective:   Patient says that her nausea is much improved today. Continues to complain of left knee pain and right-sided chest pain which is worse with deep breaths or coughing or even touching. Denies dyspnea. Denies bleeding. According to nursing, no evidence of bleeding.  Objective  Vital signs in last 24 hours: Filed Vitals:   08/15/11 0700 08/15/11 0800 08/15/11 0856 08/15/11 0900  BP: 127/49  127/63 129/44  Pulse: 54  71 72  Temp:  98.4 F (36.9 C)    TempSrc:  Oral    Resp: 20  21 17   Height:      Weight:      SpO2: 100%  100% 99%   Weight change:   Intake/Output Summary (Last 24 hours) at 08/15/11 1025 Last data filed at 08/15/11 0900  Gross per 24 hour  Intake 1598.25 ml  Output    950 ml  Net 648.25 ml    Physical Exam:  General Exam: Comfortable.  Respiratory System: Occasional basal crackles but otherwise clear to auscultation. No increased work of breathing. Reproducible right sided anterior chest wall tenderness  Cardiovascular System: First and second heart sounds heard. Regular Rate and rhythm. No JVD/murmurs/pedal edema. Telemetry shows sinus rhythm in 70's with frequent PVCs. Gastrointestinal System: Abdomen is non distended, soft and normal bowel sounds heard. Non Tender.  Central Nervous System: Alert and oriented. No focal neurological deficits. Extremities: Exam per orthopedics. Neck: Supple.  Labs:  Basic Metabolic Panel:  Lab 08/15/11 8119 08/14/11 0341 08/13/11 0150  NA 125* 124* 129*  K 3.6 3.6 3.0*  CL 91* 88* 88*  CO2 25 25 27   GLUCOSE 103* 108* 136*  BUN 30* 27* 14  CREATININE 0.86 0.88 0.84  CALCIUM 7.9* 7.9* 8.5  ALB -- -- --  PHOS -- -- --   Liver Function Tests: No results found for this basename: AST:3,ALT:3,ALKPHOS:3,BILITOT:3,PROT:3,ALBUMIN:3 in the last 168 hours No results found for this basename: LIPASE:3,AMYLASE:3 in the last 168 hours No results found for this basename: AMMONIA:3 in the last 168 hours CBC:  Lab  08/15/11 0338 08/14/11 0341 08/13/11 0150 08/12/11 0355 08/11/11 0432  WBC 11.8* 16.9* 21.3* -- --  NEUTROABS -- 12.8* -- -- --  HGB 7.7* 8.5* 10.5* -- --  HCT 22.5* 24.7* 31.5* -- --  MCV 85.2 84.3 85.6 89.8 91.8  PLT 146* 119* 129* -- --   Cardiac Enzymes:  Lab 08/13/11 1320 08/13/11 0511 08/12/11 2259  CKTOTAL 153 200* 204*  CKMB 1.6 1.6 2.4  CKMBINDEX -- -- --  TROPONINI 0.62* 1.04* 1.06*   CBG: No results found for this basename: GLUCAP:5 in the last 168 hours  Iron Studies: No results found for this basename: IRON,TIBC,TRANSFERRIN,FERRITIN in the last 72 hours Studies/Results: No results found. Medications:    . 0.9 % NaCl with KCl 20 mEq / L 75 mL/hr at 08/14/11 1447  . 0.9 % NaCl with KCl 20 mEq / L    . DISCONTD: diltiazem (CARDIZEM) infusion 5 mg/hr (08/14/11 1900)      . acetaminophen  1,000 mg Intravenous Once  . atorvastatin  10 mg Oral QHS  . aztreonam  2 g Intravenous Q8H  . Chlorhexidine Gluconate Cloth  6 each Topical Q0600  . diltiazem  120 mg Oral Daily  . docusate sodium  100 mg Oral BID  . fesoterodine  8 mg Oral Daily  . gabapentin  600 mg Oral QHS  . HYDROmorphone      .  HYDROmorphone (DILAUDID) injection  0.5  mg Intravenous Once  . levothyroxine  175 mcg Oral QAC breakfast  . metoprolol tartrate  12.5 mg Oral BID  . mupirocin ointment  1 application Nasal BID  . ondansetron (ZOFRAN) IV  4 mg Intravenous Q6H  . pantoprazole (PROTONIX) IV  80 mg Intravenous Q24H  . potassium chloride  40 mEq Oral Once  . sodium chloride  500 mL Intravenous Once  . vancomycin  1,000 mg Intravenous Q12H  . DISCONTD: rivaroxaban  20 mg Oral Q supper    I  have reviewed scheduled and prn medications.     Problem/Plan: Principal Problem:  *OA (osteoarthritis) of knee Active Problems:  HYPERTENSION, UNSPECIFIED  CAROTID ARTERY DISEASE  Postop Hyponatremia  Postop Acute blood loss anemia  Postop Transfusion  Fever  Encephalopathy acute  Hypokalemia   Atrial fibrillation with RVR  Thrombocytopenia  Elevated troponin  1. S/P Left Total Knee Arthoplasty 5/15: Management per primary service.  2. Febrile illness: unclear source. UCx neg. Blood cultures x2: Negative to date. CTA chest: no acute process. Seems to be defervescing. Vancomycin (MRSA screen +) and Aztreonam per Pharmacy (started 5/18). Will consider D. escalating antibiotics on 5/21. 3. Afib with RVR: pptd by fever, pain, ABLA,: Reverted to sinus rhythm. Being changed to oral Cardizem and metoprolol. .Xarelto on hold and do to anemia. 4. Aute posthemorrhagic Anemia (ABLA): Probably secondary to operative blood loss. Plan for additional 2 units of PRBCs today. Follow CBCs tomorrow. Hemoglobin dropped. 5. Hypokalemia: Repleted. 6. Encephalopathy 2/2 to pain, pain meds, acute illness. No focal deficits. Seems to have resolved. Monitor 7. Thrombocytopenia: probably 2/2 ABLA, acute illness; Monitor. Improving 8. Nausea and Vomiting: Will do scheduled Zofran, change PPI to IV and monitor. Unclear etiology.? Secondary to pain and acute illness. Has not been getting opioids. improving. 9. Elevated Troponin: 2/2 to Demand ischemia from Rapid Afib, Fever, Anemia: Cardiology following. We'll order echocardiogram. 10. Hyponatremia: ? Secondary to poor oral intake with associated dehydration and diuretics. Diuretics were discontinued. Will briefly treat with IV fluids and follow BMP. stable. Follow BMP tomorrow. 11. HTN: reasonable control. 12. Fluid density mass like opacity in the infrahilar region on CTA chest, increasing in size compared to prior:? Enlarging bronchogenic cyst.? Pulmonary  consulted . 13. Full Code: confirmed with Son Mr. Milana Obey on 5/18.   [name] [name] 08/15/2011,10:25 AM  LOS: 5 days

## 2011-08-15 NOTE — Plan of Care (Signed)
Problem: Phase II Progression Outcomes Goal: Ambulates Outcome: Progressing CPM today and PT did ROM w pt.  Severe pain noted after both interventions Goal: Tolerating diet Pt beginning to develop appetite, nausea improved. Goal: Discharge plan established Plan to have skilled care upon discharge per CM and pt.  Problem: Phase III Progression Outcomes Goal: Incision clean - minimal/no drainage Outcome: Progressing Bruising and blisters noted, MD changed dressing this am prior to this RN arriving.

## 2011-08-15 NOTE — Progress Notes (Signed)
    SUBJECTIVE: Pt has pain in the left leg and in the right chest wall which hurts when she moves and when this area is touched. Converted to NSR last night.   BP 106/41  Pulse 56  Temp(Src) 98.5 F (36.9 C) (Oral)  Resp 17  Ht 5' 5.5" (1.664 m)  Wt 167 lb (75.751 kg)  BMI 27.37 kg/m2  SpO2 100%  Intake/Output Summary (Last 24 hours) at 08/15/11 0732 Last data filed at 08/15/11 0555  Gross per 24 hour  Intake 1468.25 ml  Output   1000 ml  Net 468.25 ml    PHYSICAL EXAM General: Well developed, well nourished, appears to be in pain. Alert and oriented x 3.  Psych:  Good affect, responds appropriately Neck: No JVD. No masses noted.  Lungs: Clear bilaterally with no wheezes or rhonci noted.  Heart: RRR with ectopy with no murmurs noted. Abdomen: Bowel sounds are present. Soft, non-tender.  Extremities: No edema right leg. Surgical brace on left leg.   LABS: Basic Metabolic Panel:  Basename 08/15/11 0338 08/14/11 0341  NA 125* 124*  K 3.6 3.6  CL 91* 88*  CO2 25 25  GLUCOSE 103* 108*  BUN 30* 27*  CREATININE 0.86 0.88  CALCIUM 7.9* 7.9*  MG -- 1.8  PHOS -- --   CBC:  Basename 08/15/11 0338 08/14/11 0341  WBC 11.8* 16.9*  NEUTROABS -- 12.8*  HGB 7.7* 8.5*  HCT 22.5* 24.7*  MCV 85.2 84.3  PLT 146* 119*   Cardiac Enzymes:  Basename 08/13/11 1320 08/13/11 0511 08/12/11 2259  CKTOTAL 153 200* 204*  CKMB 1.6 1.6 2.4  CKMBINDEX -- -- --  TROPONINI 0.62* 1.04* 1.06*    Current Meds:    . atorvastatin  10 mg Oral QHS  . aztreonam  2 g Intravenous Q8H  . Chlorhexidine Gluconate Cloth  6 each Topical Q0600  . docusate sodium  100 mg Oral BID  . fesoterodine  8 mg Oral Daily  . gabapentin  600 mg Oral QHS  . HYDROmorphone      .  HYDROmorphone (DILAUDID) injection  0.5 mg Intravenous Once  . levothyroxine  175 mcg Oral QAC breakfast  . metoprolol tartrate  12.5 mg Oral BID  . mupirocin ointment  1 application Nasal BID  . ondansetron (ZOFRAN) IV  4 mg  Intravenous Q6H  . pantoprazole (PROTONIX) IV  80 mg Intravenous Q24H  . rivaroxaban  20 mg Oral Q supper  . sodium chloride  500 mL Intravenous Once  . vancomycin  1,000 mg Intravenous Q12H  . DISCONTD: esomeprazole  40 mg Oral Q1200     ASSESSMENT AND PLAN: 78 yo WF with h/o paroxysmal AF  with fever, leukocytosis, anemia and atrial fibrillation with RVR with mild elevation of troponin after total knee arthroplasty on 08/10/11. Now with reproducible right chest wall pain, left leg pain, NSR with PVCs, anemia.    1. Atrial fibrillation: Now in sinus rhythm. IV cardizem was stopped. Will add Cardizem CD 120 mg po Qdaily. Continue beta blocker. Would not anticoagulate at this time with recent surgical procedure. Will replace potassium. Check magnesium.   2. Elevated troponin: Likely secondary to demand ischemia with RVR, anemia, infection. Chest pain is reproducible with palpation and likely non-cardiac. No further ischemic workup at this time. Plans for echo today.   3. Post-op anemia: Consider transfusion given age and other comorbidities.   We will follow with you.    Kaeden Mester  5/20/20137:32 AM

## 2011-08-15 NOTE — Progress Notes (Signed)
  Echocardiogram 2D Echocardiogram has been performed.  Cathie Beams Deneen 08/15/2011, 11:26 AM

## 2011-08-15 NOTE — Consult Note (Signed)
[facility] Consultation Note  Referring Provider: Dr. Lequita Halt Primary Care Physician:  Horald Pollen., PA, PA  Reason for Consultation:  Abnormal CT scan, anemia  HPI: [name] [name] is a 77 y.o. female we were asked to see for drop in hemoglobin and for evaluation of abnormal CT scan (chest).  She is 5 days post-op from left knee replacement.  Since her surgery, she has had persistent troubles with left knee pain as well as troubles with right-sided chest pain.  She has poor appetite post-operatively, but specifically denies any dysphagia, abdominal pain.  Hgb has dropped about 2 grams post-operative.  However, she has not had any blood in her stool; in fact, she denies having any bowel movements since surgery. Prior history significant for bronchogenic cyst requiring surgical treatment at [facility] in 2007.  Chest CT done recently to evaluate her chest pain showed right infrahilar cystic-like mass, a little bit larger than prior comparison CT in 2007.  She denies having had any prior endoscopy.     Past Medical History  Diagnosis Date  . Other and unspecified hyperlipidemia   . Carotid artery disease     50-69% stenosis R.ICA,12.11        4/13 carotid dopplar EPIC  . Hypothyroidism   . Anemia   . GERD (gastroesophageal reflux disease)   . Arthritis   . Atrial fibrillation 4/07    Remote h/o amiodarone use, recurred in 07/2011 post op from knee arthroplasty  . Shortness of breath 4/07    with  walking long distances since removal bronchogenic cyst- states no changes  . Unspecified essential hypertension     Past Surgical History  Procedure Date  . Tonsillectomy   . Cholecystectomy   . Appendectomy   . Thoracotomy 4/07    removal bronchogenic cyst  . Total knee arthroplasty 08/10/2011    Procedure: TOTAL KNEE ARTHROPLASTY;  Surgeon: Loanne Drilling, MD;  Location: [facility];  Service: Orthopedics;  Laterality: Left;    Prior to Admission medications   Medication Sig  Start Date End Date Taking? Authorizing Provider  ALPRAZolam Prudy Feeler) 0.5 MG tablet Take 0.5 mg by mouth 3 (three) times daily as needed. anxiety   Yes Historical Provider, MD  conjugated estrogens (PREMARIN) vaginal cream Place vaginally 2 (two) times daily.   Yes Historical Provider, MD  esomeprazole (NEXIUM) 40 MG capsule Take 40 mg by mouth daily.    Yes Historical Provider, MD  gabapentin (NEURONTIN) 300 MG capsule Take 2 capsules by mouth at bedtime. & at bedtime 01/04/11  Yes Historical Provider, MD  HYDROcodone-acetaminophen (VICODIN) 5-500 MG per tablet Take 1 tablet by mouth every 6 (six) hours as needed.    Yes Historical Provider, MD  levothyroxine (SYNTHROID, LEVOTHROID) 175 MCG tablet Take 175 mcg by mouth daily.    Yes Historical Provider, MD  simvastatin (ZOCOR) 20 MG tablet Take 20 mg by mouth daily.    Yes Historical Provider, MD  tolterodine (DETROL LA) 4 MG 24 hr capsule Take 4 mg by mouth daily.    Yes Historical Provider, MD  aspirin EC 81 MG tablet Take 81 mg by mouth daily.     Historical Provider, MD  Calcium Carbonate (CALCIUM 600 PO) Take 1 tablet by mouth daily.    Historical Provider, MD  Cholecalciferol (VITAMIN D3) 1000 UNITS CAPS Take 1 capsule by mouth daily.     Historical Provider, MD  fish oil-omega-3 fatty acids 1000 MG capsule Take 1 g by mouth daily.  Historical Provider, MD  Garlic Oil 1000 MG CAPS Take 1 capsule by mouth daily.     Historical Provider, MD  IRON CR PO Take 1 tablet by mouth daily.     Historical Provider, MD  Multiple Vitamin (MULTIVITAMIN) tablet Take 1 tablet by mouth daily.     Historical Provider, MD  oxaprozin (DAYPRO) 600 MG tablet Take 600 mg by mouth 2 (two) times daily.     Historical Provider, MD  vitamin A 8000 UNIT capsule Take 8,000 Units by mouth daily.     Historical Provider, MD  vitamin C (ASCORBIC ACID) 500 MG tablet Take 500 mg by mouth daily.     Historical Provider, MD  zinc gluconate 50 MG tablet Take 50 mg by mouth  daily.    Historical Provider, MD    Current Facility-Administered Medications  Medication Dose Route Frequency Provider Last Rate Last Dose  . 0.9 % NaCl with KCl 20 mEq/ L  infusion   Intravenous Continuous Elease Etienne, MD 75 mL/hr at 08/14/11 1447    . acetaminophen (OFIRMEV) IV 1,000 mg  1,000 mg Intravenous Once Alexzandrew Perkins, PA   1,000 mg at 08/15/11 0849  . acetaminophen (TYLENOL) tablet 650 mg  650 mg Oral Q6H PRN Loanne Drilling, MD   650 mg at 08/14/11 1101   Or  . acetaminophen (TYLENOL) suppository 650 mg  650 mg Rectal Q6H PRN Loanne Drilling, MD      . ALPRAZolam Prudy Feeler) tablet 0.5 mg  0.5 mg Oral TID PRN Loanne Drilling, MD   0.5 mg at 08/15/11 0136  . atorvastatin (LIPITOR) tablet 10 mg  10 mg Oral QHS Gwen Her, PHARMD   10 mg at 08/14/11 2103  . aztreonam (AZACTAM) 2 g in dextrose 5 % 50 mL IVPB  2 g Intravenous Q8H Gwen Her, PHARMD   2 g at 08/15/11 0555  . bisacodyl (DULCOLAX) suppository 10 mg  10 mg Rectal Daily PRN Loanne Drilling, MD      . Chlorhexidine Gluconate Cloth 2 % PADS 6 each  6 each Topical Q0600 Rhetta Mura, MD   6 each at 08/15/11 0555  . diltiazem (CARDIZEM CD) 24 hr capsule 120 mg  120 mg Oral Daily Kathleene Hazel, MD   120 mg at 08/15/11 0855  . diphenhydrAMINE (BENADRYL) 12.5 MG/5ML elixir 12.5 mg  12.5 mg Oral Q4H PRN Elease Etienne, MD      . docusate sodium (COLACE) capsule 100 mg  100 mg Oral BID Loanne Drilling, MD   100 mg at 08/14/11 2102  . fesoterodine (TOVIAZ) tablet 8 mg  8 mg Oral Daily Loanne Drilling, MD   8 mg at 08/13/11 1037  . gabapentin (NEURONTIN) capsule 600 mg  600 mg Oral QHS Loanne Drilling, MD   600 mg at 08/14/11 2101  . HYDROmorphone (DILAUDID) 1 MG/ML injection           . HYDROmorphone (DILAUDID) injection 0.5 mg  0.5 mg Intravenous Once Jinger Neighbors, NP   0.5 mg at 08/15/11 0700  . levothyroxine (SYNTHROID, LEVOTHROID) tablet 175 mcg  175 mcg Oral QAC breakfast Loanne Drilling,  MD   175 mcg at 08/15/11 0818  . menthol-cetylpyridinium (CEPACOL) lozenge 3 mg  1 lozenge Oral PRN Loanne Drilling, MD       Or  . phenol (CHLORASEPTIC) mouth spray 1 spray  1 spray Mouth/Throat PRN Loanne Drilling, MD      .  methocarbamol (ROBAXIN) tablet 500 mg  500 mg Oral Q8H PRN Rhetta Mura, MD       Or  . methocarbamol (ROBAXIN) 500 mg in dextrose 5 % 50 mL IVPB  500 mg Intravenous Q8H PRN Rhetta Mura, MD   500 mg at 08/14/11 2041  . metoprolol tartrate (LOPRESSOR) tablet 12.5 mg  12.5 mg Oral BID Rhetta Mura, MD   12.5 mg at 08/14/11 2102  . mupirocin ointment (BACTROBAN) 2 % 1 application  1 application Nasal BID Rhetta Mura, MD   1 application at 08/15/11 0903  . ondansetron (ZOFRAN) injection 4 mg  4 mg Intravenous Q6H Elease Etienne, MD   4 mg at 08/15/11 0555  . ondansetron (ZOFRAN) injection 4 mg  4 mg Intravenous Q8H PRN Elease Etienne, MD   4 mg at 08/15/11 0432  . pantoprazole (PROTONIX) 80 mg in sodium chloride 0.9 % 100 mL IVPB  80 mg Intravenous Q24H Elease Etienne, MD   80 mg at 08/15/11 0900  . polyethylene glycol (MIRALAX / GLYCOLAX) packet 17 g  17 g Oral Daily PRN Loanne Drilling, MD      . potassium chloride SA (K-DUR,KLOR-CON) CR tablet 40 mEq  40 mEq Oral Once Kathleene Hazel, MD      . sodium chloride 0.9 % bolus 500 mL  500 mL Intravenous Once Alexzandrew Perkins, PA      . traMADol Janean Sark) tablet 50-100 mg  50-100 mg Oral Q6H PRN Loanne Drilling, MD   100 mg at 08/15/11 0432  . vancomycin (VANCOCIN) IVPB 1000 mg/200 mL premix  1,000 mg Intravenous Q12H Gwen Her, PHARMD   1,000 mg at 08/15/11 0013  . zolpidem (AMBIEN) tablet 5 mg  5 mg Oral QHS PRN Loanne Drilling, MD      . DISCONTD: diltiazem (CARDIZEM) 100 mg in dextrose 5 % 100 mL infusion  5-15 mg/hr Intravenous Continuous Rhetta Mura, MD 5 mL/hr at 08/14/11 1900 5 mg/hr at 08/14/11 1900  . DISCONTD: rivaroxaban (XARELTO) tablet 20 mg  20 mg Oral Q  supper Gwen Her, PHARMD   20 mg at 08/14/11 1713    Allergies as of 05/16/2011 - Review Complete 03/28/2011  Allergen Reaction Noted  . Morphine    . Penicillins      Family History  Problem Relation Age of Onset  . Cancer    . Stroke    . Diabetes    . Hypertension      History   Social History  . Marital Status: Widowed    Spouse Name: N/A    Number of Children: N/A  . Years of Education: N/A   Occupational History  . Not on file.   Social History Main Topics  . Smoking status: Never Smoker   . Smokeless tobacco: Never Used  . Alcohol Use: No  . Drug Use: No  . Sexually Active: Not on file   Other Topics Concern  . Not on file   Social History Narrative  . No narrative on file    Review of Systems: Positive bold Gen: Denies any fever, chills, rigors, night sweats, anorexia, fatigue, weakness, malaise, involuntary weight loss, and sleep disorder CV: Denies chest pain, angina, palpitations, syncope, orthopnea, PND, peripheral edema, and claudication. Resp: Denies dyspnea, cough, sputum, wheezing, coughing up blood. GI: Described in detail in HPI.    GU : Denies urinary burning, blood in urine, urinary frequency, urinary hesitancy, nocturnal urination, and urinary incontinence. MS: Denies left knee  pain and swelling.  Denies muscle weakness, cramps, atrophy.  Derm: Denies rash, itching, oral ulcerations, hives, unhealing ulcers.  Psych: Denies depression, anxiety, memory loss, suicidal ideation, hallucinations,  and confusion. Heme: Denies bruising, bleeding, and enlarged lymph nodes. Neuro:  Denies any headaches, dizziness, paresthesias. Endo:  Denies any problems with DM, thyroid, adrenal function.  Physical Exam: Vital signs in last 24 hours: Temp:  [98 F (36.7 C)-98.9 F (37.2 C)] 98.4 F (36.9 C) (05/20 0800) Pulse Rate:  [36-84] 72  (05/20 0900) Resp:  [15-25] 17  (05/20 0900) BP: (106-145)/(41-65) 129/44 mmHg (05/20 0900) SpO2:  [99  %-100 %] 99 % (05/20 0900) Last BM Date: 08/13/11 General:   Alert,  A bit uncomfortable-appearing, but not acutely toxic Head:  Normocephalic and atraumatic. Eyes:  Sclera clear, no icterus.   Conjunctiva pink. Ears:  Normal auditory acuity. Nose:  No deformity, discharge,  or lesions. Mouth:  No deformity or lesions.  Oropharynx pink & moist. Neck:  Supple; no masses or thyromegaly. Lungs:  Clear throughout to auscultation.   No wheezes, crackles, or rhonchi. No acute distress. Heart:  Regular rate and rhythm; no murmurs, clicks, rubs,  or gallops. Abdomen:  Soft, nontender and nondistended. No masses, hepatosplenomegaly or hernias noted. Normal bowel sounds, without guarding, and without rebound.     Msk:  Some right-sided chest soreness , Symmetrical without gross deformities. Normal posture. Pulses:  Normal pulses noted. Extremities:  Left knee pain Neurologic:  Alert and  oriented x4;  Limited post-operative mobility; grossly normal neurologically. Skin:  Intact without significant lesions or rashes. Psych:  Alert and cooperative. Normal mood and affect.  Lab Results:  Basename 08/15/11 0338 08/14/11 0341 08/13/11 0150  WBC 11.8* 16.9* 21.3*  HGB 7.7* 8.5* 10.5*  HCT 22.5* 24.7* 31.5*  PLT 146* 119* 129*   BMET  Basename 08/15/11 0338 08/14/11 0341 08/13/11 0150  NA 125* 124* 129*  K 3.6 3.6 3.0*  CL 91* 88* 88*  CO2 25 25 27   GLUCOSE 103* 108* 136*  BUN 30* 27* 14  CREATININE 0.86 0.88 0.84  CALCIUM 7.9* 7.9* 8.5   LFT No results found for this basename: PROT,ALBUMIN,AST,ALT,ALKPHOS,BILITOT,BILIDIR,IBILI in the last 72 hours PT/INR  Basename 08/13/11 0150  LABPROT 19.9*  INR 1.66*   Impression:  1.  Acute drop in hemoglobin.  Suspect post-operative changes.  Could perhaps have had some bleeding into her previously noted (and operated on) bronchogenic cyst.  Doubt GI blood loss; patient has had no blood in stool and has not had a bowel movement in several  days. 2.  Abnormal CT chest.  Suspect post-operative changes from prior bronchogenic cyst versus residual/recurrent cyst.  Doubt primary esophageal problem.  Plan:  1.  Would not suggest any endoscopic evaluation at this time, in absence of overt GI bleeding. 2.  If chest discomfort persists, could consider pulmonary consultation for evaluation of right infrahilar lesion, with history of prior bronchogenic cyst surgery 2007. 3.  Will sign off; please call with questions.   LOS: 5 days   Kate Sweetman M  08/15/2011, 10:03 AM

## 2011-08-15 NOTE — Progress Notes (Signed)
Physical Therapy Treatment Patient Details Name: [name] [name] MRN: [id number] DOB: 05/24/1930 Today's Date: 08/15/2011 Time: 4098-1191 PT Time Calculation (min): 27 min  PT Assessment / Plan / Recommendation Comments on Treatment Session  Pt receiving second unit of blood and RN stated that it would be best to do bed exercises.  Pt tolerated them very well, stating that she will do what she has to do to get better.      Follow Up Recommendations  Skilled nursing facility    Barriers to Discharge        Equipment Recommendations  Defer to next venue    Recommendations for Other Services OT consult  Frequency 7X/week   Plan Discharge plan remains appropriate    Precautions / Restrictions Precautions Precautions: Knee Required Braces or Orthoses: Knee Immobilizer - Left Knee Immobilizer - Left: Discontinue once straight leg raise with < 10 degree lag Restrictions Weight Bearing Restrictions: No Other Position/Activity Restrictions: WBAT   Pertinent Vitals/Pain Pt states she is sore with exercises, but will do them.     Mobility       Exercises Total Joint Exercises Ankle Circles/Pumps: AROM;Both;20 reps Quad Sets: AROM;Left;10 reps Short Arc QuadBarbaraann [name];Left;10 reps Heel Slides: AAROM;Left;10 reps Hip ABduction/ADduction: AAROM;Left;10 reps;Supine Straight Leg Raises: AAROM;Left;10 reps   PT Diagnosis:    PT Problem List:   PT Treatment Interventions:     PT Goals Acute Rehab PT Goals PT Goal Formulation: With patient Time For Goal Achievement: 08/16/11 Potential to Achieve Goals: Fair  Visit Information  Last PT Received On: 08/15/11 Assistance Needed: +2    Subjective Data  Subjective: Its sore, but I'll do my best Patient Stated Goal: to get moving    Cognition  Overall Cognitive Status: Appears within functional limits for tasks assessed/performed Arousal/Alertness: Awake/alert Orientation Level: Appears intact for tasks assessed Behavior During  Session: [facility] for tasks performed    Balance     End of Session PT - End of Session Activity Tolerance: Patient tolerated treatment well;Patient limited by pain Patient left: in bed;with call bell/phone within reach Nurse Communication: Mobility status CPM Left Knee CPM Left Knee: Off    Page, [name] [name] 08/15/2011, 2:44 PM

## 2011-08-15 NOTE — Progress Notes (Signed)
Subjective: 5 Days Post-Op Procedure(s) (LRB): TOTAL KNEE ARTHROPLASTY (Left) Patient reports pain as moderate.   Patient seen in rounds wby Dr. Lequita [name]. Patient is having problems with nausea/vomiting, pain in the knee, requiring pain medications and drop in HGB again requiring blood trasnfusion.  CT Abdomen done and reported to Dr. Lequita [name] that there was a fluid collection around the esophagus.  GI consult called to [name] , group on call at [name] [name] today. Plan is to go Skilled nursing facility after [name] stay - [name] [place].   Objective: Vital signs in last 24 hours: Temp:  [98 F (36.7 C)-98.9 F (37.2 C)] 98.5 F (36.9 C) (05/20 0400) Pulse Rate:  [36-133] 54  (05/20 0700) Resp:  [15-25] 20  (05/20 0700) BP: (106-145)/(41-65) 127/49 mmHg (05/20 0700) SpO2:  [100 %] 100 % (05/20 0700)  Intake/Output from previous day:  Intake/Output Summary (Last 24 hours) at 08/15/11 0810 Last data filed at 08/15/11 0756  Gross per 24 hour  Intake 1428.25 ml  Output    950 ml  Net 478.25 ml    Intake/Output this shift: Total I/O In: -  Out: 50 [Urine:50]  Labs:  [name] 08/15/11 0338 08/14/11 0341 08/13/11 0150  HGB 7.7* 8.5* 10.5*    Basename 08/15/11 0338 08/14/11 0341  WBC 11.8* 16.9*  RBC 2.64* 2.93*  HCT 22.5* 24.7*  PLT 146* 119*    Basename 08/15/11 0338 08/14/11 0341  NA 125* 124*  K 3.6 3.6  CL 91* 88*  CO2 25 25  BUN 30* 27*  CREATININE 0.86 0.88  GLUCOSE 103* 108*  CALCIUM 7.9* 7.9*    Basename 08/13/11 0150  LABPT --  INR 1.66*    EXAM General - Patient is Alert Extremity - Neurovascular intact Sensation intact distally Dressing/Incision - clean, dry, no drainage Motor Function - intact, moving foot and toes well on exam.   Past Medical History  Diagnosis Date  . Other and unspecified hyperlipidemia   . Carotid artery disease     50-69% stenosis R.ICA,12.11        4/13 carotid dopplar EPIC  . Hypothyroidism     . Anemia   . GERD (gastroesophageal reflux disease)   . Arthritis   . Atrial fibrillation 4/07    Remote h/o amiodarone use, recurred in 07/2011 post op from knee arthroplasty  . Shortness of breath 4/07    with  walking long distances since removal bronchogenic cyst- states no changes  . Unspecified essential hypertension     Assessment/Plan: 5 Days Post-Op Procedure(s) (LRB): TOTAL KNEE ARTHROPLASTY (Left) Principal Problem:  *OA (osteoarthritis) of knee Active Problems:  HYPERTENSION, UNSPECIFIED  CAROTID ARTERY DISEASE  Postop Hyponatremia  Postop Acute blood loss anemia  Postop Transfusion  Fever  Encephalopathy acute  Hypokalemia  Atrial fibrillation with RVR  Thrombocytopenia  Elevated troponin   Discharge to SNF, [facility], once the patient is stable.  ABLA - HGB down to 7.7 - Blood today. GI Consult called - Make NPO for now.  DVT Prophylaxis - Xarelto - Place on hold pending GI consult. Was on 20 mg  Dose for Afib to be given tonight at 1700.   Weight-Bearing as tolerated to left leg  [name] 08/15/2011, 8:10 AM

## 2011-08-15 NOTE — Progress Notes (Signed)
OT  Note  OT will recheck on pt next day. Pt just got in CPM and will get blood this afternoon.  Pt agreeable.  PT plans to see pt later this afternoon.   Alba Cory 08/15/2011, 1:20 PM

## 2011-08-15 NOTE — Progress Notes (Signed)
CARE MANAGEMENT NOTE 08/15/2011  Patient:  [name] [name], [name] [name]   Account Number:  [id number]  Date Initiated:  08/11/2011  Documentation initiated by:  Colleen Can  Subjective/Objective Assessment:   dx osteoarthritis left knee; total knee replacemnt     Action/Plan:   Cm spoke with patient. Plans is requesting skilled facility rehab   Anticipated DC Date:  08/16/2011   Anticipated DC Plan:  SKILLED NURSING FACILITY  In-house referral  Clinical Social Worker      DC Planning Services  CM consult      [facility] Choice  NA   Choice offered to / List presented to:  NA   DME arranged  NA      DME agency  NA     HH arranged  NA      HH agency  NA   Status of service:  Completed, signed off Medicare Important Message given?  NA - LOS <3 / Initial given by admissions (If response is "NO", the following Medicare IM given date fields will be blank) Date Medicare IM given:   Date Additional Medicare IM given:    Discharge Disposition:    Per UR Regulation:  Reviewed for med. necessity/level of care/duration of stay  If discussed at Long Length of Stay Meetings, dates discussed:    Comments:  05202013/Daniya Aramburo Earlene Plater, RN, BSN, CCM patient transferred to icu due to artrial fib with RVR and requiring iv Cardizem drip until am of 05202013/hgb 7.7 on [id number] and may require blood transfusion.s No discharge needs present at time of this review at the sdu/icu level. Case Management [phone]

## 2011-08-15 NOTE — Progress Notes (Signed)
Nursing note:  [phone] Noted both IV sites are oozing with blood from insertion sites at 1800, Xeralto dose due at this time.  This RN called Ortho PA, spoke w Dimitri Ped, PA regarding issue- she discussed with daytime PA.  Normal dose of Xeralto for DVT prophylaxis is 10mg  PO daily, currently pts order reads 20mg  PO daily.  This RN called Cardiology to discuss situation, spoke with Physicians Assistant regarding concern of bleeding, she referred to both Hospitalist and Ortho since med was not ordered by Cardiology, Ortho has been managing this medicine.  Reported to Dr. Waymon Amato and Amber Constable,PA (Ortho) and received order to decrease medicine to 10mg  PO tonight per Ortho.    Ok Edwards, RN

## 2011-08-16 DIAGNOSIS — R509 Fever, unspecified: Secondary | ICD-10-CM

## 2011-08-16 DIAGNOSIS — D62 Acute posthemorrhagic anemia: Secondary | ICD-10-CM

## 2011-08-16 DIAGNOSIS — I4891 Unspecified atrial fibrillation: Secondary | ICD-10-CM

## 2011-08-16 DIAGNOSIS — E876 Hypokalemia: Secondary | ICD-10-CM

## 2011-08-16 LAB — TYPE AND SCREEN: Unit division: 0

## 2011-08-16 LAB — BASIC METABOLIC PANEL
Calcium: 8.1 mg/dL — ABNORMAL LOW (ref 8.4–10.5)
GFR calc Af Amer: >90 mL/min (ref >90)
GFR calc non Af Amer: 81 mL/min — ABNORMAL LOW (ref >90)
Glucose, Bld: 106 mg/dL — ABNORMAL HIGH (ref 70–99)
Potassium: 3.9 mEq/L (ref 3.5–5.1)
Sodium: 128 mEq/L — ABNORMAL LOW (ref 135–145)

## 2011-08-16 LAB — CBC
MCH: 29 pg (ref 26.0–34.0)
RBC: 3.45 MIL/uL — ABNORMAL LOW (ref 3.87–5.11)
WBC: 11 10*3/uL — ABNORMAL HIGH (ref 4.0–10.5)

## 2011-08-16 MED ORDER — HYDROCODONE-ACETAMINOPHEN 5-325 MG PO TABS
1.0000 | ORAL_TABLET | ORAL | Status: DC | PRN
  Administered 2011-08-16: 1 via ORAL
  Administered 2011-08-16 – 2011-08-18 (×5): 2 via ORAL
  Filled 2011-08-16: qty 1
  Filled 2011-08-16 (×5): qty 2

## 2011-08-16 MED ORDER — DICLOFENAC SODIUM 1 % TD GEL
Freq: Three times a day (TID) | TRANSDERMAL | Status: DC | PRN
  Administered 2011-08-16: 11:00:00 via TOPICAL
  Filled 2011-08-16: qty 100

## 2011-08-16 NOTE — Progress Notes (Signed)
Physical Therapy Treatment Patient Details Name: [name] [name] MRN: [id number] DOB: 08-30-1930 Today's Date: 08/16/2011 Time: 7829-5621 PT Time Calculation (min): 32 min  PT Assessment / Plan / Recommendation Comments on Treatment Session  Pt with increased mobility today due to receiving blood yesterday.  Continues to require +2 assist due to weakness, fatigue and pain.      Follow Up Recommendations  Skilled nursing facility    Barriers to Discharge        Equipment Recommendations  Defer to next venue    Recommendations for Other Services OT consult  Frequency 7X/week   Plan Discharge plan remains appropriate    Precautions / Restrictions Precautions Precautions: Knee Required Braces or Orthoses: Knee Immobilizer - Left Knee Immobilizer - Left: Discontinue once straight leg raise with < 10 degree lag Restrictions Weight Bearing Restrictions: No Other Position/Activity Restrictions: WBAT   Pertinent Vitals/Pain 5/10    Mobility  Bed Mobility Bed Mobility: Supine to Sit;Sitting - Scoot to Edge of Bed Supine to Sit: 1: +2 Total assist Supine to Sit: Patient Percentage: 30% Sitting - Scoot to Edge of Bed: 1: +2 Total assist Sitting - Scoot to Edge of Bed: Patient Percentage: 20% Details for Bed Mobility Assistance: Assist for LLE off of bed and for trunk to attain sitting position.  Cues for hand placement on bed to self assist trunk.   Transfers Transfers: Sit to Stand;Stand to Sit;Stand Pivot Transfers Sit to Stand: 1: +2 Total assist;From elevated surface;With upper extremity assist;From bed Sit to Stand: Patient Percentage: 40% Stand to Sit: 1: +2 Total assist;With upper extremity assist;To chair/3-in-1;With armrests Stand to Sit: Patient Percentage: 30% Stand Pivot Transfers: 1: +2 Total assist Stand Pivot Transfers: Patient Percentage: 40% Details for Transfer Assistance: Assist to rise with max cuing for glute activation for upright posture.  Cues for  sequencing/technique with RW and hand placement when sitting due to pt sitting without warning.  Some manual assist for advancing LLE.   Ambulation/Gait Assistive device: Rolling walker Gait Pattern: Step-to pattern;Decreased stride length;Trunk flexed;Decreased step length - left;Decreased stance time - right Gait velocity: decreased    Exercises     PT Diagnosis:    PT Problem List:   PT Treatment Interventions:     PT Goals Acute Rehab PT Goals PT Goal Formulation: With patient Time For Goal Achievement: 08/16/11 Potential to Achieve Goals: Fair Pt will go Supine/Side to Sit: with mod assist PT Goal: Supine/Side to Sit - Progress: Progressing toward goal Pt will go Sit to Stand: with mod assist PT Goal: Sit to Stand - Progress: Progressing toward goal Pt will go Stand to Sit: with mod assist PT Goal: Stand to Sit - Progress: Progressing toward goal Pt will Transfer Bed to Chair/Chair to Bed: with mod assist PT Transfer Goal: Bed to Chair/Chair to Bed - Progress: Progressing toward goal Pt will Ambulate: with mod assist;1 - 15 feet PT Goal: Ambulate - Progress: Progressing toward goal  Visit Information  Last PT Received On: 08/16/11 Assistance Needed: +2    Subjective Data  Subjective: I hurt all over, but I'm going to try and get up. Patient Stated Goal: to get moving    Cognition  Overall Cognitive Status: Appears within functional limits for tasks assessed/performed Arousal/Alertness: Awake/alert Orientation Level: Appears intact for tasks assessed Behavior During Session: [facility] for tasks performed    Balance     End of Session PT - End of Session Equipment Utilized During Treatment: Left knee immobilizer Activity Tolerance: Patient  tolerated treatment well;Patient limited by pain Patient left: in chair;with call bell/phone within reach Nurse Communication: Mobility status    Page, Meribeth Mattes 08/16/2011, 11:07 AM

## 2011-08-16 NOTE — Clinical Social Work Placement (Signed)
Clinical Social Work Department CLINICAL SOCIAL WORK PLACEMENT NOTE 08/16/2011  Patient:  ABYGAIL, GALENO  Account Number:  [id number] Admit date:  08/10/2011  Clinical Social Worker:  Cori Razor, LCSW  Date/time:  08/11/2011 01:26 AM  Clinical Social Work is seeking post-discharge placement for this patient at the following level of care:   SKILLED NURSING   (*CSW will update this form in Epic as items are completed)   08/11/2011  Patient/family provided with [facility] Department of Clinical Social Work's list of facilities offering this level of care within the geographic area requested by the patient (or if unable, by the patient's family).  08/11/2011  Patient/family informed of their freedom to choose among providers that offer the needed level of care, that participate in Medicare, Medicaid or managed care program needed by the patient, have an available bed and are willing to accept the patient.  08/11/2011  Patient/family informed of [facility]' ownership interest in [facility], as well as of the fact that they are under no obligation to receive care at this facility.  PASARR submitted to EDS on 08/11/2011 PASARR number received from EDS on 08/16/2011  FL2 transmitted to all facilities in geographic area requested by pt/family on  08/11/2011 FL2 transmitted to all facilities within larger geographic area on   Patient informed that his/her managed care company has contracts with or will negotiate with  certain facilities, including the following:     Patient/family informed of bed offers received:  08/15/2011 Patient chooses bed at [facility] Physician recommends and patient chooses bed at    Patient to be transferred to  on   Patient to be transferred to facility by   The following physician request were entered in Epic:   Additional Comments:  Pt needs to be medically stable, but all set for SNF when ready.  Vennie Homans,  [place] 08/16/2011 10:37 AM [phone]

## 2011-08-16 NOTE — Progress Notes (Signed)
Received pt from ICU, on left leg immobilizer, slightly swollen on left upper thigh. AOx4. Complains of pain on her left leg of pain scale 8/10.

## 2011-08-16 NOTE — Progress Notes (Signed)
Subjective: 6 Days Post-Op Procedure(s) (LRB): TOTAL KNEE ARTHROPLASTY (Left) Patient reports pain as mild.   Patient seen in rounds with Dr. Lequita Halt. She states that she is doing OK.  Still with pain.  Nausea improving.  Still has complaints of the chest wall soreness.  Try Voltaren Gel if the hospital has on formulary. Patient is having problems with pain in the knee, requiring pain medications Plan is to go Skilled nursing facility after hospital stay - [facility] and [facility].  Objective: Vital signs in last 24 hours: Temp:  [97.8 F (36.6 C)-99 F (37.2 C)] 97.9 F (36.6 C) (05/20 2245) Pulse Rate:  [56-72] 63  (05/21 0400) Resp:  [13-21] 19  (05/21 0400) BP: (93-145)/(29-100) 138/65 mmHg (05/21 0400) SpO2:  [84 %-100 %] 100 % (05/21 0400)  Intake/Output from previous day:  Intake/Output Summary (Last 24 hours) at 08/16/11 0752 Last data filed at 08/16/11 0700  Gross per 24 hour  Intake 1909.5 ml  Output   2250 ml  Net -340.5 ml    Intake/Output this shift:    Labs:  Basename 08/16/11 0330 08/15/11 2054 08/15/11 0338 08/14/11 0341  HGB 10.0* 10.2* 7.7* 8.5*    Basename 08/16/11 0330 08/15/11 2054 08/15/11 0338  WBC 11.0* -- 11.8*  RBC 3.45* -- 2.64*  HCT 29.7* 30.1* --  PLT SPECIMEN CHECKED FOR CLOTS -- 146*    Basename 08/16/11 0330 08/15/11 0338  NA 128* 125*  K 3.9 3.6  CL 95* 91*  CO2 25 25  BUN 22 30*  CREATININE 0.69 0.86  GLUCOSE 106* 103*  CALCIUM 8.1* 7.9*   No results found for this basename: LABPT:2,INR:2 in the last 72 hours  EXAM General - Patient is Alert, Appropriate and Oriented Extremity - Neurovascular intact Sensation intact distally Dressing/Incision - clean, dry, no drainage, ecchymosis noted with swelling. Motor Function - intact, moving foot and toes well on exam.   Past Medical History  Diagnosis Date  . Other and unspecified hyperlipidemia   . Carotid artery disease     50-69% stenosis R.ICA,12.11        4/13  carotid dopplar EPIC  . Hypothyroidism   . Anemia   . GERD (gastroesophageal reflux disease)   . Arthritis   . Atrial fibrillation 4/07    Remote h/o amiodarone use, recurred in 07/2011 post op from knee arthroplasty  . Shortness of breath 4/07    with  walking long distances since removal bronchogenic cyst- states no changes  . Unspecified essential hypertension     Assessment/Plan: 6 Days Post-Op Procedure(s) (LRB): TOTAL KNEE ARTHROPLASTY (Left) Principal Problem:  *OA (osteoarthritis) of knee Active Problems:  HYPERTENSION, UNSPECIFIED  CAROTID ARTERY DISEASE  Postop Hyponatremia  Postop Acute blood loss anemia  Postop Transfusion  Fever  Encephalopathy acute  Hypokalemia  Atrial fibrillation with RVR  Thrombocytopenia  Elevated troponin  Abnormal chest CT  HGB back up after blood from 7.7 up to 10.2 last evening.  HGB 10.0 this morning - stable. Na+ improving also from 125 up to 128.   Recheck Labs in AM. Hopefully can move out of unit when Cards and Medicine feel that she is stable.  DVT Prophylaxis - Xarelto Weight-Bearing as tolerated to left leg  [name] [name] 08/16/2011, 7:52 AM

## 2011-08-16 NOTE — Progress Notes (Signed)
Interval history:  78 year old female with history of hyperlipidemia, coronary artery disease, hypothyroidism, remote A. fib who was not on anticoagulation, bronchogenic cyst removal in April 2007, underwent left total knee arthroplasty on 5/15. The hospitalist service was consulted on 5/17 secondary to right-sided chest pain, fevers, altered mental status, nausea and tachycardia. Patient was found to be in rapid A. fib and transferred to the intensive care unit. Patient was initially placed on Cardizem drip, reverted to sinus rhythm and has been switched to oral medications. Patient had high fevers and was empirically placed on broad spectrum IV antibiotics. No clear source was determined, patient defervesced and cultures are thus far negative and antibiotics will be discontinued on 5/21 and monitored. Patient is status post packed red blood cell transfusion for postoperative blood loss anemia. She had nausea and vomiting, possibly secondary to pain and medications which continues to improve. Mental status changes have resolved. Right-sided chest pain thought to be musculoskeletal in nature. CT angiogram of the chest was negative for PE. Patient has been started on Xarelto for A. fib. She is being transferred to telemetry on 5/21.    Subjective:   Nausea improved. Persisting left knee and right-sided chest pain. Right-sided chest pain made worse on movement of right shoulder, touching the right chest or deep breathing.? Dyspnea but no cough.  Objective  Vital signs in last 24 hours: Filed Vitals:   08/16/11 0400 08/16/11 0800 08/16/11 1033 08/16/11 1200  BP: 138/65 135/66 128/58 108/89  Pulse: 63 70 71 60  Temp:  98.7 F (37.1 C)  97.7 F (36.5 C)  TempSrc:  Oral  Oral  Resp: 19 19  14   Height:      Weight:      SpO2: 100% 100% 100% 100%   Weight change:   Intake/Output Summary (Last 24 hours) at 08/16/11 1317 Last data filed at 08/16/11 1300  Gross per 24 hour  Intake   2147 ml    Output   2500 ml  Net   -353 ml    Physical Exam:  General Exam: Comfortable.  Respiratory System: clear to auscultation. No increased work of breathing. Reproducible right sided anterior chest wall tenderness/right lower costochondral region tenderness.  Cardiovascular System: First and second heart sounds heard. Regular Rate and rhythm. No JVD/murmurs/pedal edema. Telemetry shows sinus rhythm in the 60s-70's with PVCs. Gastrointestinal System: Abdomen is non distended, soft and normal bowel sounds heard. Non Tender.  Central Nervous System: Alert and oriented. No focal neurological deficits. Extremities: Exam per orthopedics. Neck: Supple.  Labs:  Basic Metabolic Panel:  Lab 08/16/11 4098 08/15/11 0338 08/14/11 0341  NA 128* 125* 124*  K 3.9 3.6 3.6  CL 95* 91* 88*  CO2 25 25 25   GLUCOSE 106* 103* 108*  BUN 22 30* 27*  CREATININE 0.69 0.86 0.88  CALCIUM 8.1* 7.9* 7.9*  ALB -- -- --  PHOS -- -- --   Liver Function Tests: No results found for this basename: AST:3,ALT:3,ALKPHOS:3,BILITOT:3,PROT:3,ALBUMIN:3 in the last 168 hours No results found for this basename: LIPASE:3,AMYLASE:3 in the last 168 hours No results found for this basename: AMMONIA:3 in the last 168 hours CBC:  Lab 08/16/11 0330 08/15/11 2054 08/15/11 0338 08/14/11 0341 08/13/11 0150 08/12/11 0355  WBC 11.0* -- 11.8* 16.9* -- --  NEUTROABS -- -- -- 12.8* -- --  HGB 10.0* 10.2* 7.7* -- -- --  HCT 29.7* 30.1* 22.5* -- -- --  MCV 86.1 -- 85.2 84.3 85.6 89.8  PLT SPECIMEN CHECKED FOR CLOTS --  146* 119* -- --   Cardiac Enzymes:  Lab 08/13/11 1320 08/13/11 0511 08/12/11 2259  CKTOTAL 153 200* 204*  CKMB 1.6 1.6 2.4  CKMBINDEX -- -- --  TROPONINI 0.62* 1.04* 1.06*   CBG: No results found for this basename: GLUCAP:5 in the last 168 hours  Iron Studies: No results found for this basename: IRON,TIBC,TRANSFERRIN,FERRITIN in the last 72 hours Studies/Results: No results found. Medications:    . 0.9 %  NaCl with KCl 20 mEq / L 50 mL/hr (08/15/11 2018)      . atorvastatin  10 mg Oral QHS  . aztreonam  2 g Intravenous Q8H  . Chlorhexidine Gluconate Cloth  6 each Topical Q0600  . diltiazem  120 mg Oral Daily  . docusate sodium  100 mg Oral BID  . fesoterodine  8 mg Oral Daily  . gabapentin  600 mg Oral QHS  . HYDROmorphone      . levothyroxine  175 mcg Oral QAC breakfast  . metoprolol tartrate  12.5 mg Oral BID  . mupirocin ointment  1 application Nasal BID  . ondansetron (ZOFRAN) IV  4 mg Intravenous Q6H  . pantoprazole (PROTONIX) IV  80 mg Intravenous Q24H  . rivaroxaban  20 mg Oral Daily  . sodium chloride  500 mL Intravenous Once  . vancomycin  1,000 mg Intravenous Q12H  . DISCONTD: rivaroxaban  10 mg Oral Daily  . DISCONTD: rivaroxaban  20 mg Oral Daily    I  have reviewed scheduled and prn medications.     Problem/Plan: Principal Problem:  *OA (osteoarthritis) of knee Active Problems:  HYPERTENSION, UNSPECIFIED  CAROTID ARTERY DISEASE  Postop Hyponatremia  Postop Acute blood loss anemia  Postop Transfusion  Fever  Encephalopathy acute  Hypokalemia  Atrial fibrillation with RVR  Thrombocytopenia  Elevated troponin  Abnormal chest CT  1. S/P Left Total Knee Arthoplasty 5/15: Management per primary service.  2. Febrile illness: unclear source. UCx neg. Blood cultures x2: Negative to date. CTA chest: no acute process. Defervesced. Since no clear source clinically and cultures thus far negative and patient does not look toxic/septic, will discontinue antibiotics and monitor. Obviously if patient becomes febrile again, will need further evaluation and management. 3. Afib with RVR: pptd by fever, pain, ABLA,: Reverted to sinus rhythm. Continue oral Cardizem and metoprolol. Discussed with cardiology/Dr. Verne Carrow and agreed to continue Xarelto at 20 mg daily for anticoagulation. 4. Aute posthemorrhagic Anemia (ABLA): Likely secondary to intraoperative losses.  Improved after PRBCs. No overt source of bleeding. Follow CBCs closely. 5. Hypokalemia: Repleted. 6. Encephalopathy 2/2 to pain, pain meds, acute illness. No focal deficits. Resolved. 7. Thrombocytopenia: probably 2/2 ABLA, acute illness; Monitor. Improving 8. Nausea and Vomiting: Improving. Continue scheduled Zofran for additional day, then can be changed to when necessary. Probably secondary to pain and pain medications. 9. Elevated Troponin: 2/2 to Demand ischemia from Rapid Afib, Fever, Anemia: Cardiology following. 2-D echocardiogram was a poor study. Cardiology does not recommend any further ischemia workup. 10. Hyponatremia: ? Secondary to poor oral intake with associated dehydration and diuretics. Diuretics were discontinued. Continue gentle normal saline for additional day and consider discontinuing fluids tomorrow. Follow BMP tomorrow. 11. HTN: reasonable control. 12. Fluid density mass like opacity in the infrahilar region on CTA chest, increasing in size compared to prior:? Enlarging bronchogenic cyst. Pulmonology input appreciated and do not indicate any intervention at this time. 13. Right-sided chest pain: Likely musculoskeletal/costochondritis. Agree with topical NSAIDs and monitor. Reluctant to use oral NSAIDs secondary  to nausea and vomiting, on anticoagulation 14. Hypothyroid: Continue Synthroid. TSH: 0.342. Recommend repeating in 4-6 weeks. 15. Full Code  Disposition: Overall improving. Stable for transfer to a telemetry bed, from a medical standpoint. Will follow.   [name] [name] 08/16/2011,1:17 PM  LOS: 6 days

## 2011-08-16 NOTE — Progress Notes (Signed)
ANTIBIOTIC CONSULT NOTE - FOLLOW UP  Pharmacy Consult for Vancomycin Indication: febrile illness  Allergies  Allergen Reactions  . Morphine   . Penicillins Itching    Patient Measurements: Height: 5' 5.5" (166.4 cm) Weight: 167 lb (75.751 kg) IBW/kg (Calculated) : 58.15  Adjusted Body Weight:   Vital Signs: Temp: 97.9 F (36.6 C) (05/20 2245) Temp src: Oral (05/20 2245) BP: 118/74 mmHg (05/20 1900) Pulse Rate: 68  (05/20 1900) Intake/Output from previous day: 05/20 0701 - 05/21 0700 In: 1130.5 [P.O.:80; I.V.:100; Blood:750.5; IV Piggyback:200] Out: 800 [Urine:800] Intake/Output from this shift: Total I/O In: 50 [I.V.:50] Out: -   Labs:  Basename 08/15/11 2054 08/15/11 0338 08/14/11 0341 08/13/11 0150  WBC -- 11.8* 16.9* 21.3*  HGB 10.2* 7.7* 8.5* --  PLT -- 146* 119* 129*  LABCREA -- -- -- --  CREATININE -- 0.86 0.88 0.84   Estimated Creatinine Clearance: 55.5 ml/min (by C-G formula based on Cr of 0.86).  Basename 08/15/11 2255  VANCOTROUGH 18.5  VANCOPEAK --  VANCORANDOM --  GENTTROUGH --  GENTPEAK --  GENTRANDOM --  TOBRATROUGH --  TOBRAPEAK --  TOBRARND --  AMIKACINPEAK --  AMIKACINTROU --  AMIKACIN --     Microbiology: Recent Results (from the past 720 hour(s))  SURGICAL PCR SCREEN     Status: Normal   Collection Time   08/02/11 12:00 PM      Component Value Range Status Comment   MRSA, PCR NEGATIVE  NEGATIVE  Final    Staphylococcus aureus NEGATIVE  NEGATIVE  Final   URINE CULTURE     Status: Normal   Collection Time   08/13/11 12:19 AM      Component Value Range Status Comment   Specimen Description URINE, CATHETERIZED   Final    Special Requests NONE   Final    Culture  Setup Time [id number]   Final    Colony Count NO GROWTH   Final    Culture NO GROWTH   Final    Report Status 08/14/2011 FINAL   Final   MRSA PCR SCREENING     Status: Abnormal   Collection Time   08/13/11 12:50 AM      Component Value Range Status Comment   MRSA by  PCR POSITIVE (*) NEGATIVE  Final   TECHNOLOGIST SMEAR REVIEW     Status: Normal   Collection Time   08/13/11  1:50 AM      Component Value Range Status Comment   Path Review POLYCHROMASIA PRESENT   Final   CULTURE, BLOOD (ROUTINE X 2)     Status: Normal (Preliminary result)   Collection Time   08/13/11 10:30 AM      Component Value Range Status Comment   Specimen Description BLOOD RIGHT ARM  10 ML IN [facility] BOTTLE   Final    Special Requests NONE   Final    Culture  Setup Time [id number]   Final    Culture     Final    Value:        BLOOD CULTURE RECEIVED NO GROWTH TO DATE CULTURE WILL BE HELD FOR 5 DAYS BEFORE ISSUING A FINAL NEGATIVE REPORT   Report Status PENDING   Incomplete   CULTURE, BLOOD (ROUTINE X 2)     Status: Normal (Preliminary result)   Collection Time   08/13/11 10:40 AM      Component Value Range Status Comment   Specimen Description BLOOD RIGHT HAND  10 ML IN [facility] BOTTLE  Final    Special Requests NONE   Final    Culture  Setup Time [id number]   Final    Culture     Final    Value:        BLOOD CULTURE RECEIVED NO GROWTH TO DATE CULTURE WILL BE HELD FOR 5 DAYS BEFORE ISSUING A FINAL NEGATIVE REPORT   Report Status PENDING   Incomplete     Anti-infectives     Start     Dose/Rate Route Frequency Ordered Stop   08/13/11 1200   vancomycin (VANCOCIN) IVPB 1000 mg/200 mL premix        1,000 mg 200 mL/hr over 60 Minutes Intravenous Every 12 hours 08/13/11 1020     08/13/11 1100   aztreonam (AZACTAM) injection 2 g  Status:  Discontinued        2 g Intramuscular Every 8 hours 08/13/11 1018 08/13/11 1050   08/13/11 1100   aztreonam (AZACTAM) 2 g in dextrose 5 % 50 mL IVPB        2 g 100 mL/hr over 30 Minutes Intravenous 3 times per day 08/13/11 1050     08/12/11 2300   metroNIDAZOLE (FLAGYL) IVPB 500 mg  Status:  Discontinued        500 mg 100 mL/hr over 60 Minutes Intravenous Every 8 hours 08/12/11 2204 08/13/11 0934   08/12/11 2300   ciprofloxacin (CIPRO) IVPB  400 mg  Status:  Discontinued        400 mg 200 mL/hr over 60 Minutes Intravenous Every 12 hours 08/12/11 2204 08/13/11 0934   08/12/11 0900   cefTRIAXone (ROCEPHIN) 1 g in dextrose 5 % 50 mL IVPB  Status:  Discontinued        1 g 100 mL/hr over 30 Minutes Intravenous Every 24 hours 08/12/11 0752 08/12/11 2129   08/11/11 0100   vancomycin (VANCOCIN) IVPB 1000 mg/200 mL premix        1,000 mg 200 mL/hr over 60 Minutes Intravenous Every 12 hours 08/10/11 1404 08/11/11 0114   08/10/11 1005   vancomycin (VANCOCIN) IVPB 1000 mg/200 mL premix  Status:  Discontinued        1,000 mg 200 mL/hr over 60 Minutes Intravenous 60 min pre-op 08/10/11 1005 08/10/11 1546          Assessment: Patient with vancomycin level at goal.  RN to hang dose now.  Goal of Therapy:  Vancomycin trough level 15-20 mcg/ml  Plan:  Measure antibiotic drug levels at steady state Follow up culture results Continue with current dosing.  Darlina Guys, Jacquenette Shone Crowford 08/16/2011,12:05 AM

## 2011-08-16 NOTE — Progress Notes (Signed)
SUBJECTIVE: Still with continuous right sided chest pain x days. No SOB. Left leg pain.   BP 138/65  Pulse 63  Temp(Src) 97.9 F (36.6 C) (Oral)  Resp 19  Ht 5' 5.5" (1.664 m)  Wt 167 lb (75.751 kg)  BMI 27.37 kg/m2  SpO2 100%  Intake/Output Summary (Last 24 hours) at 08/16/11 0754 Last data filed at 08/16/11 0700  Gross per 24 hour  Intake 1909.5 ml  Output   2250 ml  Net -340.5 ml    PHYSICAL EXAM General: Well developed, well nourished, in no acute distress. Alert and oriented x 3.  Psych:  Good affect, responds appropriately Neck: No JVD. No masses noted.  Lungs: Clear bilaterally with no wheezes or rhonci noted.  Heart: RRR with no murmurs noted. Abdomen: Bowel sounds are present. Soft, non-tender.  Extremities: No lower extremity edema. Left leg with post-operative bandages.  LABS: Basic Metabolic Panel:  Basename 08/16/11 0330 08/15/11 2255 08/15/11 0338 08/14/11 0341  NA 128* -- 125* --  K 3.9 -- 3.6 --  CL 95* -- 91* --  CO2 25 -- 25 --  GLUCOSE 106* -- 103* --  BUN 22 -- 30* --  CREATININE 0.69 -- 0.86 --  CALCIUM 8.1* -- 7.9* --  MG -- 2.3 -- 1.8  PHOS -- -- -- --   CBC:  Basename 08/16/11 0330 08/15/11 2054 08/15/11 0338 08/14/11 0341  WBC 11.0* -- 11.8* --  NEUTROABS -- -- -- 12.8*  HGB 10.0* 10.2* -- --  HCT 29.7* 30.1* -- --  MCV 86.1 -- 85.2 --  PLT SPECIMEN CHECKED FOR CLOTS -- 146* --   Cardiac Enzymes:  Basename 08/13/11 1320  CKTOTAL 153  CKMB 1.6  CKMBINDEX --  TROPONINI 0.62*    Current Meds:    . acetaminophen  1,000 mg Intravenous Once  . atorvastatin  10 mg Oral QHS  . aztreonam  2 g Intravenous Q8H  . Chlorhexidine Gluconate Cloth  6 each Topical Q0600  . diltiazem  120 mg Oral Daily  . docusate sodium  100 mg Oral BID  . fesoterodine  8 mg Oral Daily  . gabapentin  600 mg Oral QHS  . HYDROmorphone      . levothyroxine  175 mcg Oral QAC breakfast  . metoprolol tartrate  12.5 mg Oral BID  . mupirocin ointment   1 application Nasal BID  . ondansetron (ZOFRAN) IV  4 mg Intravenous Q6H  . pantoprazole (PROTONIX) IV  80 mg Intravenous Q24H  . potassium chloride  40 mEq Oral Once  . rivaroxaban  20 mg Oral Daily  . sodium chloride  500 mL Intravenous Once  . vancomycin  1,000 mg Intravenous Q12H  . DISCONTD: rivaroxaban  10 mg Oral Daily  . DISCONTD: rivaroxaban  20 mg Oral Q supper  . DISCONTD: rivaroxaban  20 mg Oral Daily   Echo 08/15/11: Left ventricle: Poor acoustic windows limit study. Difficult to assess LVEF . Appears to be mildly depressed. The cavity size was normal. Wall thickness was increased in a pattern of mild LVH. - Mitral valve: Mild regurgitation. - Pulmonary arteries: PA peak pressure: 38mm Hg (S).    ASSESSMENT AND PLAN: 78 yo WF with h/o paroxysmal AF with fever, leukocytosis, anemia and atrial fibrillation with RVR with mild elevation of troponin after total knee arthroplasty on 08/10/11. Now with reproducible right chest wall pain, left leg pain, NSR with PVCs, anemia.   1. Atrial fibrillation: Maintaining sinus rhythm.  She is  now on Cardizem CD 120 mg po Qdaily. Continue beta blocker. Xarelto started back by ortho.   2. Elevated troponin: Likely secondary to demand ischemia with RVR, anemia, infection. Chest pain is reproducible with palpation and likely non-cardiac. No further ischemic workup at this time. Echo was poor study with possible slightly depressed LV systolic function.   3. Post-op anemia: Likely secondary to intra-operative losses. No evidence of GI bleeding. s/p transfusion yesterday.   Will sign off for now. Please call back with questions.    Wells Gerdeman  5/21/20137:54 AM

## 2011-08-16 NOTE — Progress Notes (Signed)
ANTICOAGULATION CONSULT NOTE - Follow Up Consult  Pharmacy Consult for Xarelto Indication: Afib, post-op VTE prophylaxis  Allergies  Allergen Reactions  . Morphine   . Penicillins Itching    Patient Measurements: Height: 5' 5.5" (166.4 cm) Weight: 167 lb (75.751 kg) IBW/kg (Calculated) : 58.15   Vital Signs: Temp: 98.7 F (37.1 C) (05/21 0800) Temp src: Oral (05/21 0800) BP: 135/66 mmHg (05/21 0800) Pulse Rate: 70  (05/21 0800)  Labs:  Basename 08/16/11 0330 08/15/11 2054 08/15/11 0338 08/14/11 0341 08/13/11 1320  HGB 10.0* 10.2* -- -- --  HCT 29.7* 30.1* 22.5* -- --  PLT SPECIMEN CHECKED FOR CLOTS -- 146* 119* --  APTT -- -- -- -- --  LABPROT -- -- -- -- --  INR -- -- -- -- --  HEPARINUNFRC -- -- -- -- --  CREATININE 0.69 -- 0.86 0.88 --  CKTOTAL -- -- -- -- 153  CKMB -- -- -- -- 1.6  TROPONINI -- -- -- -- 0.62*    Estimated Creatinine Clearance: 59.7 ml/min (by C-G formula based on Cr of 0.69).   Medications:  Scheduled:     . atorvastatin  10 mg Oral QHS  . aztreonam  2 g Intravenous Q8H  . Chlorhexidine Gluconate Cloth  6 each Topical Q0600  . diltiazem  120 mg Oral Daily  . docusate sodium  100 mg Oral BID  . fesoterodine  8 mg Oral Daily  . gabapentin  600 mg Oral QHS  . HYDROmorphone      . levothyroxine  175 mcg Oral QAC breakfast  . metoprolol tartrate  12.5 mg Oral BID  . mupirocin ointment  1 application Nasal BID  . ondansetron (ZOFRAN) IV  4 mg Intravenous Q6H  . pantoprazole (PROTONIX) IV  80 mg Intravenous Q24H  . potassium chloride  40 mEq Oral Once  . rivaroxaban  20 mg Oral Daily  . sodium chloride  500 mL Intravenous Once  . vancomycin  1,000 mg Intravenous Q12H  . DISCONTD: rivaroxaban  10 mg Oral Daily  . DISCONTD: rivaroxaban  20 mg Oral Daily    Assessment:  80 YOF s/p TKA 5/15, was on Xarelto 10mg  daily x 2 doses post op (5/16-17 @ 0823 and 1119, respectively) for post op VTE ppx.   Changed to Lovenox/Warfarin 5/17  Pt is  now changed back to Xarelto for the indication of Afib; recommended dose of Xarelto for Afib is 20mg  daily  No bleeding reported;  Hgb more stable today (2 units PRBC on 5/20).  SCr stable. Maintaining NSR.  Goal of Therapy:  Appropriate dosing of Xarelto   Plan:   Continue Xarelto 20mg  daily  Follow CBC   Lynann Beaver PharmD, BCPS Pager [phone] 08/16/2011 10:33 AM

## 2011-08-16 NOTE — Evaluation (Signed)
Occupational Therapy Evaluation Patient Details Name: [name] [name] MRN: [id number] DOB: 1930/07/07 Today's Date: 08/16/2011 Time: 4098-1191 OT Time Calculation (min): 25 min  OT Assessment / Plan / Recommendation Clinical Impression  This 78 year old female was admitted for L TKA.  She was transferred to ICU/SDU with fever and A-Fib.  She was independent with ADLs prior to admission and now needs A x 2 secondary to decreased strength, activity tolerance and pain.  She will benefit from skilled OT to decrease burden of care at next venue.    OT Assessment  Patient needs continued OT Services    Follow Up Recommendations  Skilled nursing facility    Barriers to Discharge      Equipment Recommendations  Defer to next venue    Recommendations for Other Services    Frequency  Min 1X/week    Precautions / Restrictions Precautions Precautions: Knee Required Braces or Orthoses: Knee Immobilizer - Left Knee Immobilizer - Left: Discontinue once straight leg raise with < 10 degree lag Restrictions Weight Bearing Restrictions: No Other Position/Activity Restrictions: WBAT   Pertinent Vitals/Pain 8 to 10/10 L knee; right shoulder also sore    ADL  Eating/Feeding: Simulated;Independent Where Assessed - Eating/Feeding: Chair Grooming: Simulated;Set up Where Assessed - Grooming: Supported sitting Upper Body Bathing: Performed;Minimal assistance (right shoulder sore:  pt guarding; helped support RUE) Where Assessed - Upper Body Bathing: Supported sitting Lower Body Bathing: Performed;+2 Total assistance Lower Body Bathing: Patient Percentage: 10% Where Assessed - Lower Body Bathing: Supported sit to stand Upper Body Dressing: Performed;Minimal assistance Where Assessed - Upper Body Dressing: Supported sitting Lower Body Dressing: Simulated;+2 Total assistance Lower Body Dressing: Patient Percentage: 0% Where Assessed - Lower Body Dressing: Sopported sit to stand ADL Comments: pt's R  shoulder soreness affecting UB adls.  Pt very motivated    OT Diagnosis: Generalized weakness  OT Problem List: Decreased strength;Decreased activity tolerance;Decreased range of motion;Decreased knowledge of use of DME or AE;Decreased knowledge of precautions;Cardiopulmonary status limiting activity;Pain OT Treatment Interventions: Self-care/ADL training;DME and/or AE instruction;Therapeutic activities;Patient/family education;Therapeutic exercise   OT Goals Acute Rehab OT Goals OT Goal Formulation: With patient Time For Goal Achievement: 08/30/11 Potential to Achieve Goals: Good ADL Goals Pt Will Perform Upper Body Bathing: with set-up;Sitting, chair ADL Goal: Upper Body Bathing - Progress: Goal set today Pt Will Perform Lower Body Bathing: with 2+ total assist;Sit to stand from chair (pt 40% with AE sit to stand) ADL Goal: Lower Body Bathing - Progress: Goal set today Pt Will Transfer to Toilet: with 2+ total assist;3-in-1;Stand pivot transfer (pt 50%) ADL Goal: Toilet Transfer - Progress: Goal set today Additional ADL Goal #1: Pt will perform 2 sets x 10 reps of AROM to RUe within painfree tolerance independently ADL Goal: Additional Goal #1 - Progress: Goal set today  Visit Information  Last OT Received On: 08/16/11 Assistance Needed: +2    Subjective Data  Subjective: It feels so good to be sitting up. Its been 7 days Patient Stated Goal: rehab to get strong and return home   Prior Functioning  Home Living Lives With: Alone Available Help at Discharge: Skilled Nursing Facility Prior Function Level of Independence: Independent Driving: Yes Communication Communication: No difficulties Dominant Hand: Right    Cognition  Overall Cognitive Status: Appears within functional limits for tasks assessed/performed Arousal/Alertness: Awake/alert Orientation Level: Appears intact for tasks assessed Behavior During Session: [facility] for tasks performed    Extremity/Trunk Assessment  Right Upper Extremity Assessment RUE ROM/Strength/Tone: Deficits RUE ROM/Strength/Tone  Deficits: limited by pain.  AAROM to 90 for adls Left Upper Extremity Assessment LUE ROM/Strength/Tone: [facility] for tasks assessed   Mobility Sit to stand Total A x 2 pt 30%  Exercise    Balance    End of Session OT - End of Session Equipment Utilized During Treatment: Left knee immobilizer Activity Tolerance: Patient limited by fatigue;Patient limited by pain Patient left: in chair;with call bell/phone within reach   [facility] 08/16/2011, 11:11 AM Marica Otter, OTR/L [phone] 08/16/2011

## 2011-08-17 DIAGNOSIS — M79609 Pain in unspecified limb: Secondary | ICD-10-CM

## 2011-08-17 LAB — BASIC METABOLIC PANEL
Calcium: 8.3 mg/dL — ABNORMAL LOW (ref 8.4–10.5)
GFR calc Af Amer: >90 mL/min (ref >90)
GFR calc non Af Amer: 84 mL/min — ABNORMAL LOW (ref >90)
Glucose, Bld: 94 mg/dL (ref 70–99)
Potassium: 3.7 mEq/L (ref 3.5–5.1)
Sodium: 133 mEq/L — ABNORMAL LOW (ref 135–145)

## 2011-08-17 LAB — CBC
Hemoglobin: 10.1 g/dL — ABNORMAL LOW (ref 12.0–15.0)
MCH: 28.6 pg (ref 26.0–34.0)
Platelets: 256 10*3/uL (ref 150–400)
RBC: 3.53 MIL/uL — ABNORMAL LOW (ref 3.87–5.11)
WBC: 13.9 10*3/uL — ABNORMAL HIGH (ref 4.0–10.5)

## 2011-08-17 MED ORDER — DIPHENHYDRAMINE HCL 50 MG/ML IJ SOLN: 25.0000 mg | Freq: Four times a day (QID) | INTRAMUSCULAR | Status: DC | PRN

## 2011-08-17 NOTE — Progress Notes (Addendum)
VASCULAR LAB PRELIMINARY  PRELIMINARY  PRELIMINARY  PRELIMINARY  Bilateral lower extremity venous duplex completed.    Preliminary report:  Right:  No evidence of DVT or superficial thrombosis . Positive for a Baker's cyst measuring approximately 3 cm. Left:  No evidence of DVT, superficial thrombosis, or Baker's cyst. There is an area of mixed echoes noted in the popliteal fossa measuring approximately 5 cm consistent with a hematoma.  Toma Deiters D, RVS 08/17/2011, 12:48 PM  Elpidio Galea,  RDMS,RDCS

## 2011-08-17 NOTE — Progress Notes (Signed)
Subjective: Complaining of left leg swelling   Objective: Vital signs in last 24 hours: Filed Vitals:   08/16/11 1345 08/16/11 1421 08/16/11 2202 08/17/11 0641  BP:  122/77 130/69 149/72  Pulse: 61 63 62 65  Temp:  98 F (36.7 C) 98.5 F (36.9 C) 98.1 F (36.7 C)  TempSrc:  Oral Oral Oral  Resp: 18 18 16 18   Height:      Weight:      SpO2: 100% 95% 95% 93%    Intake/Output Summary (Last 24 hours) at 08/17/11 1207 Last data filed at 08/17/11 0640  Gross per 24 hour  Intake 1173.33 ml  Output    400 ml  Net 773.33 ml    Weight change:   Respiratory System: clear to auscultation. No increased work of breathing. Reproducible right sided anterior chest wall tenderness/right lower costochondral region tenderness.  Cardiovascular System: First and second heart sounds heard. Regular Rate and rhythm. No JVD/murmurs/pedal edema. Telemetry shows sinus rhythm in the 60s-70's with PVCs.  Gastrointestinal System: Abdomen is non distended, soft and normal bowel sounds heard. Non Tender.  Central Nervous System: Alert and oriented. No focal neurological deficits.  Extremities: Exam per orthopedics.  Neck: Supple.   Lab Results: Results for orders placed during the hospital encounter of 08/10/11 (from the past 24 hour(s))  BASIC METABOLIC PANEL     Status: Abnormal   Collection Time   08/17/11  5:13 AM      Component Value Range   Sodium 133 (*) 135 - 145 (mEq/L)   Potassium 3.7  3.5 - 5.1 (mEq/L)   Chloride 100  96 - 112 (mEq/L)   CO2 27  19 - 32 (mEq/L)   Glucose, Bld 94  70 - 99 (mg/dL)   BUN 15  6 - 23 (mg/dL)   Creatinine, Ser 1.61  0.50 - 1.10 (mg/dL)   Calcium 8.3 (*) 8.4 - 10.5 (mg/dL)   GFR calc non Af Amer 84 (*) >90 (mL/min)   GFR calc Af Amer >90  >90 (mL/min)  CBC     Status: Abnormal   Collection Time   08/17/11  5:13 AM      Component Value Range   WBC 13.9 (*) 4.0 - 10.5 (K/uL)   RBC 3.53 (*) 3.87 - 5.11 (MIL/uL)   Hemoglobin 10.1 (*) 12.0 - 15.0 (g/dL)   HCT 09.6 (*) 04.5 - 46.0 (%)   MCV 89.2  78.0 - 100.0 (fL)   MCH 28.6  26.0 - 34.0 (pg)   MCHC 32.1  30.0 - 36.0 (g/dL)   RDW 40.9 (*) 81.1 - 15.5 (%)   Platelets 256  150 - 400 (K/uL)     Micro: Recent Results (from the past 240 hour(s))  URINE CULTURE     Status: Normal   Collection Time   08/13/11 12:19 AM      Component Value Range Status Comment   Specimen Description URINE, CATHETERIZED   Final    Special Requests NONE   Final    Culture  Setup Time [id number]   Final    Colony Count NO GROWTH   Final    Culture NO GROWTH   Final    Report Status 08/14/2011 FINAL   Final   MRSA PCR SCREENING     Status: Abnormal   Collection Time   08/13/11 12:50 AM      Component Value Range Status Comment   MRSA by PCR POSITIVE (*) NEGATIVE  Final   TECHNOLOGIST SMEAR REVIEW  Status: Normal   Collection Time   08/13/11  1:50 AM      Component Value Range Status Comment   Path Review POLYCHROMASIA PRESENT   Final   CULTURE, BLOOD (ROUTINE X 2)     Status: Normal (Preliminary result)   Collection Time   08/13/11 10:30 AM      Component Value Range Status Comment   Specimen Description BLOOD RIGHT ARM  10 ML IN [facility] BOTTLE   Final    Special Requests NONE   Final    Culture  Setup Time [id number]   Final    Culture     Final    Value:        BLOOD CULTURE RECEIVED NO GROWTH TO DATE CULTURE WILL BE HELD FOR 5 DAYS BEFORE ISSUING A FINAL NEGATIVE REPORT   Report Status PENDING   Incomplete   CULTURE, BLOOD (ROUTINE X 2)     Status: Normal (Preliminary result)   Collection Time   08/13/11 10:40 AM      Component Value Range Status Comment   Specimen Description BLOOD RIGHT HAND  10 ML IN [facility] BOTTLE   Final    Special Requests NONE   Final    Culture  Setup Time [id number]   Final    Culture     Final    Value:        BLOOD CULTURE RECEIVED NO GROWTH TO DATE CULTURE WILL BE HELD FOR 5 DAYS BEFORE ISSUING A FINAL NEGATIVE REPORT   Report Status PENDING   Incomplete      Studies/Results: No results found.  Medications:  Scheduled Meds:   . atorvastatin  10 mg Oral QHS  . Chlorhexidine Gluconate Cloth  6 each Topical Q0600  . diltiazem  120 mg Oral Daily  . docusate sodium  100 mg Oral BID  . fesoterodine  8 mg Oral Daily  . gabapentin  600 mg Oral QHS  . levothyroxine  175 mcg Oral QAC breakfast  . metoprolol tartrate  12.5 mg Oral BID  . mupirocin ointment  1 application Nasal BID  . ondansetron (ZOFRAN) IV  4 mg Intravenous Q6H  . pantoprazole (PROTONIX) IV  80 mg Intravenous Q24H  . rivaroxaban  20 mg Oral Daily  . sodium chloride  500 mL Intravenous Once  . DISCONTD: aztreonam  2 g Intravenous Q8H  . DISCONTD: vancomycin  1,000 mg Intravenous Q12H   Continuous Infusions:   . 0.9 % NaCl with KCl 20 mEq / L 50 mL/hr at 08/16/11 1738   PRN Meds:.acetaminophen, acetaminophen, ALPRAZolam, bisacodyl, diclofenac sodium, diphenhydrAMINE, diphenhydrAMINE, HYDROcodone-acetaminophen, menthol-cetylpyridinium, methocarbamol (ROBAXIN) IV, methocarbamol, ondansetron (ZOFRAN) IV, phenol, polyethylene glycol, traMADol, zolpidem, DISCONTD: HYDROcodone-acetaminophen   Assessment: Principal Problem:  *OA (osteoarthritis) of knee Active Problems:  HYPERTENSION, UNSPECIFIED  CAROTID ARTERY DISEASE  Postop Hyponatremia  Postop Acute blood loss anemia  Postop Transfusion  Fever  Encephalopathy acute  Hypokalemia  Atrial fibrillation with RVR  Thrombocytopenia  Elevated troponin  Abnormal chest CT   Plan:  1. S/P Left Total Knee Arthoplasty 5/15: Management per primary service.  2. Febrile illness: unclear source. UCx neg. Blood cultures x2: Negative to date. CTA chest: no acute process. Defervesced. Since no clear source clinically and cultures thus far negative and patient does not look toxic/septic, will discontinue antibiotics and monitor. Obviously if patient becomes febrile again, will need further evaluation and management. 3. Afib with  RVR: pptd by fever, pain, ABLA,: Reverted to sinus rhythm. Continue oral Cardizem  and metoprolol. Discussed with cardiology/Dr. Verne Carrow and agreed to continue Xarelto at 20 mg daily for anticoagulation. 4. Aute posthemorrhagic Anemia (ABLA): Likely secondary to intraoperative losses. Improved after PRBCs. No overt source of bleeding. Follow CBCs closely.s/p 2 units  5. Hypokalemia: Repleted. 6. Encephalopathy 2/2 to pain, pain meds, acute illness. No focal deficits. Resolved. 7. Thrombocytopenia: probably 2/2 ABLA, acute illness; Monitor. Improving 8. Nausea and Vomiting: Improving. Continue scheduled Zofran for additional day, then can be changed to when necessary. Probably secondary to pain and pain medications. 9. Elevated Troponin: 2/2 to Demand ischemia from Rapid Afib, Fever, Anemia: Cardiology following. 2-D echocardiogram was a poor study. Cardiology does not recommend any further ischemia workup. 10. Hyponatremia: ? Secondary to poor oral intake with associated dehydration and diuretics. Diuretics were discontinued. Continue gentle normal saline for additional day and consider discontinuing fluids tomorrow. Follow BMP tomorrow. 11. HTN: reasonable control. 12. Fluid density mass like opacity in the infrahilar region on CTA chest, increasing in size compared to prior:? Enlarging bronchogenic cyst. Pulmonology input appreciated and do not indicate any intervention at this time. 13. Right-sided chest pain: Likely musculoskeletal/costochondritis. Agree with topical NSAIDs and monitor. Reluctant to use oral NSAIDs secondary to nausea and vomiting, on anticoagulation 14. Hypothyroid: Continue Synthroid. TSH: 0.342. Recommend repeating in 4-6 weeks. 15. Full Code Disposition: Overall improving. Stable for transfer to a telemetry bed, from a medical standpoint. Will follow. 16.  Leg pain ,doppler to r/o DVT ,  Disposition: Overall improving. Stable for discharge from a medical standpoint  ,  from a medical standpoint.   Follow up cbc in one week Follow up with cardiology in 2 weeks    LOS: 7 days   [facility] 08/17/2011, 12:07 PM

## 2011-08-17 NOTE — Progress Notes (Signed)
Subjective: 7 Days Post-Op Procedure(s) (LRB): TOTAL KNEE ARTHROPLASTY (Left) Patient reports pain as mild.  She is feeling better today. Patient seen in rounds with Dr. Lequita Halt. Patient is well, but has had some minor complaints of pain in the knee, requiring pain medications Plan is to go Skilled nursing facility after hospital stay - [facility].   Objective: Vital signs in last 24 hours: Temp:  [98.1 F (36.7 C)-98.5 F (36.9 C)] 98.3 F (36.8 C) (05/22 1418) Pulse Rate:  [62-70] 70  (05/22 1418) Resp:  [16-18] 18  (05/22 1418) BP: (130-149)/(69-72) 135/72 mmHg (05/22 1418) SpO2:  [93 %-97 %] 97 % (05/22 1418)  Intake/Output from previous day:  Intake/Output Summary (Last 24 hours) at 08/17/11 2006 Last data filed at 08/17/11 1819  Gross per 24 hour  Intake 2145.83 ml  Output      0 ml  Net 2145.83 ml    Intake/Output this shift:    Labs:  Basename 08/17/11 0513 08/16/11 0330 08/15/11 2054 08/15/11 0338  HGB 10.1* 10.0* 10.2* 7.7*    Basename 08/17/11 0513 08/16/11 0330  WBC 13.9* 11.0*  RBC 3.53* 3.45*  HCT 31.5* 29.7*  PLT 256 SPECIMEN CHECKED FOR CLOTS    Basename 08/17/11 0513 08/16/11 0330  NA 133* 128*  K 3.7 3.9  CL 100 95*  CO2 27 25  BUN 15 22  CREATININE 0.62 0.69  GLUCOSE 94 106*  CALCIUM 8.3* 8.1*   No results found for this basename: LABPT:2,INR:2 in the last 72 hours  EXAM General - Patient is Alert, Appropriate and Oriented Extremity - Neurovascular intact Sensation intact distally Dressing/Incision - clean, healing, some ecchymosis Motor Function - intact, moving foot and toes well on exam.   Past Medical History  Diagnosis Date  . Other and unspecified hyperlipidemia   . Carotid artery disease     50-69% stenosis R.ICA,12.11        4/13 carotid dopplar EPIC  . Hypothyroidism   . Anemia   . GERD (gastroesophageal reflux disease)   . Arthritis   . Atrial fibrillation 4/07    Remote h/o amiodarone use,  recurred in 07/2011 post op from knee arthroplasty  . Shortness of breath 4/07    with  walking long distances since removal bronchogenic cyst- states no changes  . Unspecified essential hypertension     Assessment/Plan: 7 Days Post-Op Procedure(s) (LRB): TOTAL KNEE ARTHROPLASTY (Left) Principal Problem:  *OA (osteoarthritis) of knee Active Problems:  HYPERTENSION, UNSPECIFIED  CAROTID ARTERY DISEASE  Postop Hyponatremia  Postop Acute blood loss anemia  Postop Transfusion  Fever  Encephalopathy acute  Hypokalemia  Atrial fibrillation with RVR  Thrombocytopenia  Elevated troponin  Abnormal chest CT   Up with therapy Plan for discharge tomorrow Discharge to SNF  DVT Prophylaxis - Xarelto Weight-Bearing as tolerated to left leg HGB has been stable past two days - HGB 10.2, 10.0, and 10.1 Possible transfer to SNF tomorrow.  [name] [name] 08/17/2011, 8:06 PM

## 2011-08-17 NOTE — Progress Notes (Signed)
Physical Therapy Treatment Patient Details Name: [name] [name] MRN: [id number] DOB: [birth year] Today's Date: 08/17/2011 Time: 9629-5284 PT Time Calculation (min): 33 min  PT Assessment / Plan / Recommendation Comments on Treatment Session  Pt continues to make progress with today being most improved session thus far.  Pt to D/C tomorrow.     Follow Up Recommendations  Skilled nursing facility    Barriers to Discharge        Equipment Recommendations  Defer to next venue    Recommendations for Other Services    Frequency 7X/week   Plan Discharge plan remains appropriate    Precautions / Restrictions Precautions Precautions: Knee Required Braces or Orthoses: Knee Immobilizer - Left Knee Immobilizer - Left: Discontinue once straight leg raise with < 10 degree lag Restrictions Weight Bearing Restrictions: No Other Position/Activity Restrictions: WBAT   Pertinent Vitals/Pain 10/10    Mobility  Bed Mobility Bed Mobility: Supine to Sit Supine to Sit: 1: +2 Total assist Supine to Sit: Patient Percentage: 60% Details for Bed Mobility Assistance: Assist for LLE off of bed with pt demonstrating much improved mobility today and able to self assist more with UE for trunk into sitting.  Transfers Transfers: Sit to Stand;Stand to Sit;Stand Pivot Transfers Sit to Stand: 1: +2 Total assist;From elevated surface;With upper extremity assist;With armrests;From chair/3-in-1;From bed Sit to Stand: Patient Percentage: 50% Stand to Sit: 1: +2 Total assist;With upper extremity assist;To chair/3-in-1;With armrests Stand to Sit: Patient Percentage: 60% Details for Transfer Assistance: Pt with increased assist needed for standing from bed vs standing from chair.  Cues for hand placement and LE management.  Ambulation/Gait Ambulation/Gait Assistance: 1: +2 Total assist Ambulation/Gait: Patient Percentage: 40% Ambulation Distance (Feet): 10 Feet (then another 12') Assistive device: Rolling  walker Ambulation/Gait Assistance Details: Continues to require multi modal cuing for sequencing/technique with RW and equal step length on B LE.  Also provided cues for upright posture.  Gait Pattern: Step-to pattern;Decreased stride length;Trunk flexed;Decreased step length - left;Decreased stance time - right Gait velocity: decreased    Exercises     PT Diagnosis:    PT Problem List:   PT Treatment Interventions:     PT Goals Acute Rehab PT Goals PT Goal Formulation: With patient Time For Goal Achievement: 08/16/11 Potential to Achieve Goals: Fair Pt will go Supine/Side to Sit: with mod assist PT Goal: Supine/Side to Sit - Progress: Progressing toward goal Pt will go Sit to Stand: with mod assist PT Goal: Sit to Stand - Progress: Progressing toward goal Pt will go Stand to Sit: with mod assist PT Goal: Stand to Sit - Progress: Progressing toward goal Pt will Transfer Bed to Chair/Chair to Bed: with mod assist PT Transfer Goal: Bed to Chair/Chair to Bed - Progress: Progressing toward goal Pt will Ambulate: with mod assist;1 - 15 feet PT Goal: Ambulate - Progress: Progressing toward goal  Visit Information  Last PT Received On: 08/17/11 Assistance Needed: +2    Subjective Data  Subjective: My leg still hurts, but I'm going to keep trying.  I can lift my leg now.  Patient Stated Goal: to get moving    Cognition  Overall Cognitive Status: Appears within functional limits for tasks assessed/performed Arousal/Alertness: Awake/alert Orientation Level: Appears intact for tasks assessed Behavior During Session: [facility] for tasks performed    Balance     End of Session PT - End of Session Equipment Utilized During Treatment: Left knee immobilizer Activity Tolerance: Patient tolerated treatment well;Patient limited by pain Patient  left: in chair;with call bell/phone within reach Nurse Communication: Mobility status    Page, Meribeth Mattes 08/17/2011, 3:26 PM

## 2011-08-18 LAB — BASIC METABOLIC PANEL
CO2: 27 mEq/L (ref 19–32)
Calcium: 8.4 mg/dL (ref 8.4–10.5)
GFR calc non Af Amer: 86 mL/min — ABNORMAL LOW (ref >90)
Glucose, Bld: 90 mg/dL (ref 70–99)
Potassium: 3.5 mEq/L (ref 3.5–5.1)
Sodium: 136 mEq/L (ref 135–145)

## 2011-08-18 LAB — CBC
Hemoglobin: 10.5 g/dL — ABNORMAL LOW (ref 12.0–15.0)
MCV: 89.3 fL (ref 78.0–100.0)
Platelets: 369 10*3/uL (ref 150–400)
RBC: 3.63 MIL/uL — ABNORMAL LOW (ref 3.87–5.11)
WBC: 16.3 10*3/uL — ABNORMAL HIGH (ref 4.0–10.5)

## 2011-08-18 MED ORDER — METHOCARBAMOL 500 MG PO TABS: 500.0000 mg | ORAL_TABLET | Freq: Three times a day (TID) | ORAL | Status: AC | PRN

## 2011-08-18 MED ORDER — HYDROCODONE-ACETAMINOPHEN 5-325 MG PO TABS: 1.0000 | ORAL_TABLET | ORAL | Status: AC | PRN

## 2011-08-18 MED ORDER — DSS 100 MG PO CAPS: 100.0000 mg | ORAL_CAPSULE | Freq: Two times a day (BID) | ORAL | Status: AC

## 2011-08-18 MED ORDER — RIVAROXABAN 20 MG PO TABS: 20.0000 mg | ORAL_TABLET | Freq: Every day | ORAL | Status: DC

## 2011-08-18 MED ORDER — METOPROLOL TARTRATE 12.5 MG HALF TABLET: 12.5000 mg | ORAL_TABLET | Freq: Two times a day (BID) | ORAL | Status: DC

## 2011-08-18 MED ORDER — ACETAMINOPHEN 325 MG PO TABS: 650.0000 mg | ORAL_TABLET | Freq: Four times a day (QID) | ORAL | Status: DC | PRN

## 2011-08-18 MED ORDER — POLYETHYLENE GLYCOL 3350 17 G PO PACK: 17.0000 g | PACK | Freq: Every day | ORAL | Status: AC | PRN

## 2011-08-18 MED ORDER — DILTIAZEM HCL ER COATED BEADS 120 MG PO CP24: 120.0000 mg | ORAL_CAPSULE | Freq: Every day | ORAL | Status: DC

## 2011-08-18 MED ORDER — BISACODYL 10 MG RE SUPP: 10.0000 mg | Freq: Every day | RECTAL | Status: AC | PRN

## 2011-08-18 NOTE — Progress Notes (Signed)
Pt assessment has not changed from am.  Social Worker Tresa Endo arranged transportation to facility.

## 2011-08-18 NOTE — Discharge Instructions (Signed)
Dr. Gaynelle Arabian Total Joint Specialist [facility] [address], [place], [place] [address] [phone]  TOTAL KNEE REPLACEMENT POSTOPERATIVE DIRECTIONS    Knee Rehabilitation, Guidelines Following Surgery  Results after knee surgery are often greatly improved when you follow the exercise, range of motion and muscle strengthening exercises prescribed by your doctor. Safety measures are also important to protect the knee from further injury. Any time any of these exercises cause you to have increased pain or swelling in your knee joint, decrease the amount until you are comfortable again and slowly increase them. If you have problems or questions, call your caregiver or physical therapist for advice.   HOME CARE INSTRUCTIONS  Remove items at home which could result in a fall. This includes throw rugs or furniture in walking pathways.  Continue medications as instructed at time of discharge. You may have some home medications which will be placed on hold until you complete the course of blood thinner medication.  You may start showering once you are discharged home but do not submerge the incision under water.  Walk with walker as instructed.   Use walker as long as suggested by your caregivers.  Avoid periods of inactivity such as sitting longer than an hour when not asleep. This helps prevent blood clots.  You may put full weight on your legs and walk as much as is comfortable.  You may resume a sexual relationship in one month or when given the OK by your doctor.  You may return to work once you are cleared by your doctor.  Do not drive a car for 6 weeks or until released by you surgeon.   Do not drive while taking narcotics.  Wear the elastic stockings for three weeks following surgery during the day but you may remove then at night. Make sure you keep all of your appointments after your operation with all of your doctors and caregivers. You should call  the office at the above phone number and make an appointment for approximately two weeks after the date of your surgery. Change the dressing daily and reapply a dry dressing each time. Please pick up a stool softener and laxative for home use as long as you are requiring pain medications. Continue to use ice on the knee for pain and swelling from surgery. It is important for you to complete the blood thinner medication as prescribed by your doctor.  RANGE OF MOTION AND STRENGTHENING EXERCISES  Rehabilitation of the knee is important following a knee injury or an operation. After just a few days of immobilization, the muscles of the thigh which control the knee become weakened and shrink (atrophy). Knee exercises are designed to build up the tone and strength of the thigh muscles and to improve knee motion. Often times heat used for twenty to thirty minutes before working out will loosen up your tissues and help with improving the range of motion but do not use heat for the first two weeks following surgery. These exercises can be done on a training (exercise) mat, on the floor, on a table or on a bed. Use what ever works the best and is most comfortable for you Knee exercises include:  Leg Lifts - While your knee is still immobilized in a splint or cast, you can do straight leg raises. Lift the leg to 60 degrees, hold for 3 sec, and slowly lower the leg. Repeat 10-20 times 2-3 times daily. Perform this exercise against resistance later as your  knee gets better.  Quad and Hamstring Sets - Tighten up the muscle on the front of the thigh (Quad) and hold for 5-10 sec. Repeat this 10-20 times hourly. Hamstring sets are done by pushing the foot backward against an object and holding for 5-10 sec. Repeat as with quad sets.  A rehabilitation program following serious knee injuries can speed recovery and prevent re-injury in the future due to weakened muscles. Contact your doctor or a physical therapist for more  information on knee rehabilitation.   SKILLED REHAB INSTRUCTIONS: If the patient is transferred to a skilled rehab facility following release from the hospital, a list of the current medications will be sent to the facility for the patient to continue.  When discharged from the skilled rehab facility, please have the facility set up the patient's Home Health Physical Therapy prior to being released. Also, the skilled facility will be responsible for providing the patient with their medications at time of release from the facility to include their pain medication, the muscle relaxants, and their blood thinner medication. If the patient is still at the rehab facility at time of the two week follow up appointment, the skilled rehab facility will also need to assist the patient in arranging follow up appointment in our office and any transportation needs.  MAKE SURE YOU:  Understand these instructions.  Will watch your condition.  Will get help right away if you are not doing well or get worse.  Document Released: 03/14/2005 Document Revised: 03/03/2011 Document Reviewed: 09/01/2006  [facility] Patient Information 2012 [place], [place].   Take Xarelto for two more weeks, then discontinue Xarelto. Once the patient has completed the Xarelto, they may resume the 81 mg Aspirin.  Pick up stool softner and laxative for home. Do not submerge incision under water. May shower. Continue to use ice for pain and swelling from surgery.  When discharged from the skilled rehab facility, please have the facility set up the patient's Home Health Physical Therapy prior to being released.  Also provide the patient with their medications at time of release from the facility to include their pain medication, the muscle relaxants, and their blood thinner medication.  If the patient is still at the rehab facility at time of follow up appointment, please also assist the patient in arranging follow up appointment in our office  and any transportation needs.    Atrial Fibrillation Your caregiver has diagnosed you with atrial fibrillation (AFib). The heart normally beats very regularly; AFib is a type of irregular heartbeat. The heart rate may be faster or slower than normal. This can prevent your heart from pumping as well as it should. AFib can be constant (chronic) or intermittent (paroxysmal). CAUSES  Atrial fibrillation may be caused by:  Heart disease, including heart attack, coronary artery disease, heart failure, diseases of the heart valves, and others.   Blood clot in the lungs (pulmonary embolism).   Pneumonia or other infections.   Chronic lung disease.   Thyroid disease.   Toxins. These include alcohol, some medications (such as decongestant medications or diet pills), and caffeine.  In some people, no cause for AFib can be found. This is referred to as Lone Atrial Fibrillation. SYMPTOMS   Palpitations or a fluttering in your chest.   A vague sense of chest discomfort.   Shortness of breath.   Sudden onset of lightheadedness or weakness.  Sometimes, the first sign of AFib can be a complication of the condition. This could be a stroke or  heart failure. DIAGNOSIS  Your description of your condition may make your caregiver suspicious of atrial fibrillation. Your caregiver will examine your pulse to determine if fibrillation is present. An EKG (electrocardiogram) will confirm the diagnosis. Further testing may help determine what caused you to have atrial fibrillation. This may include chest x-ray, echocardiogram, blood tests, or CT scans. PREVENTION  If you have previously had atrial fibrillation, your caregiver may advise you to avoid substances known to cause the condition (such as stimulant medications, and possibly caffeine or alcohol). You may be advised to use medications to prevent recurrence. Proper treatment of any underlying condition is important to help prevent recurrence. PROGNOSIS    Atrial fibrillation does tend to become a chronic condition over time. It can cause significant complications (see below). Atrial fibrillation is not usually immediately life-threatening, but it can shorten your life expectancy. This seems to be worse in women. If you have lone atrial fibrillation and are under 45 years old, the risk of complications is very low, and life expectancy is not shortened. RISKS AND COMPLICATIONS  Complications of atrial fibrillation can include stroke, chest pain, and heart failure. Your caregiver will recommend treatments for the atrial fibrillation, as well as for any underlying conditions, to help minimize risk of complications. TREATMENT  Treatment for AFib is divided into several categories:  Treatment of any underlying condition.   Converting you out of AFib into a regular (sinus) rhythm.   Controlling rapid heart rate.   Prevention of blood clots and stroke.  Medications and procedures are available to convert your atrial fibrillation to sinus rhythm. However, recent studies have shown that this may not offer you any advantage, and cardiac experts are continuing research and debate on this topic. More important is controlling your rapid heartbeat. The rapid heartbeat causes more symptoms, and places strain on your heart. Your caregiver will advise you on the use of medications that can control your heart rate. Atrial fibrillation is a strong stroke risk. You can lessen this risk by taking blood thinning medications such as Coumadin (warfarin), or sometimes aspirin. These medications need close monitoring by your caregiver. Over-medication can cause bleeding. Too little medication may not protect against stroke. HOME CARE INSTRUCTIONS   If your caregiver prescribed medicine to make your heartbeat more normally, take as directed.   If blood thinners were prescribed by your caregiver, take EXACTLY as directed.   Perform blood tests EXACTLY as directed.    Quit smoking. Smoking increases your cardiac and lung (pulmonary) risks.   DO NOT drink alcohol.   DO NOT drink caffeinated drinks (e.g. coffee, soda, chocolate, and leaf teas). You may drink decaffeinated coffee, soda or tea.   If you are overweight, you should choose a reduced calorie diet to lose weight. Please see a registered dietitian if you need more information about healthy weight loss. DO NOT USE DIET PILLS as they may aggravate heart problems.   If you have other heart problems that are causing AFib, you may need to eat a low salt, fat, and cholesterol diet. Your caregiver will tell you if this is necessary.   Exercise every day to improve your physical fitness. Stay active unless advised otherwise.   If your caregiver has given you a follow-up appointment, it is very important to keep that appointment. Not keeping the appointment could result in heart failure or stroke. If there is any problem keeping the appointment, you must call back to this facility for assistance.  SEEK MEDICAL CARE IF:  You notice a change in the rate, rhythm or strength of your heartbeat.   You develop an infection or any other change in your overall health status.  SEEK IMMEDIATE MEDICAL CARE IF:   You develop chest pain, abdominal pain, sweating, weakness or feel sick to your stomach (nausea).   You develop shortness of breath.   You develop swollen feet and ankles.   You develop dizziness, numbness, or weakness of your face or limbs, or any change in vision or speech.  MAKE SURE YOU:   Understand these instructions.   Will watch your condition.   Will get help right away if you are not doing well or get worse.  Document Released: 03/14/2005 Document Revised: 03/03/2011 Document Reviewed: 10/17/2007 [facility] Patient Information 2012 [employer], [place].

## 2011-08-18 NOTE — Progress Notes (Signed)
Patient set to discharge to [facility] (ph#: [phone]) today. Patient & son, Harvie Heck (ph#: [phone]) made aware. [facility] called for 11am pick-up.   Clinical Social Work Department CLINICAL SOCIAL WORK PLACEMENT NOTE 08/18/2011  Patient:  [name] [name], [name] [name]  Account Number:  [id number] Admit date:  08/10/2011  Clinical Social Worker:  Cori Razor, LCSW  Date/time:  08/11/2011 01:26 AM  Clinical Social Work is seeking post-discharge placement for this patient at the following level of care:   SKILLED NURSING   (*CSW will update this form in Epic as items are completed)   08/11/2011  Patient/family provided with [facility] Department of Clinical Social Work's list of facilities offering this level of care within the geographic area requested by the patient (or if unable, by the patient's family).  08/11/2011  Patient/family informed of their freedom to choose among providers that offer the needed level of care, that participate in Medicare, Medicaid or managed care program needed by the patient, have an available bed and are willing to accept the patient.  08/11/2011  Patient/family informed of [facility]' ownership interest in [facility], as well as of the fact that they are under no obligation to receive care at this facility.  PASARR submitted to E[employer] on 08/11/2011 PASARR number received from [employer] on 08/16/2011  FL2 transmitted to all facilities in geographic area requested by pt/family on  08/11/2011 FL2 transmitted to all facilities within larger geographic area on   Patient informed that his/her managed care company has contracts with or will negotiate with  certain facilities, including the following:     Patient/family informed of bed offers received:  08/15/2011 Patient chooses bed at [facility] Physician recommends and patient chooses bed at    Patient to be transferred to [facility] on  08/18/2011 Patient to be transferred to  facility by [facility]  The following physician request were entered in Epic:   Additional Comments:    Unice Bailey, LCSWA [phone]

## 2011-08-18 NOTE — Discharge Summary (Signed)
Physician Discharge Summary   Patient ID: [name] [name] MRN: [id number] DOB/AGE: [birth year] 78 y.o.  Admit date: 08/10/2011 Discharge date: 08/18/2011  Primary Diagnosis: Osteoarthritis Left Knee   Admission Diagnoses:  Past Medical History  Diagnosis Date  . Other and unspecified hyperlipidemia   . Carotid artery disease     50-69% stenosis R.ICA,12.11        4/13 carotid dopplar EPIC  . Hypothyroidism   . Anemia   . GERD (gastroesophageal reflux disease)   . Arthritis   . Atrial fibrillation 4/07    Remote h/o amiodarone use, recurred in 07/2011 post op from knee arthroplasty  . Shortness of breath 4/07    with  walking long distances since removal bronchogenic cyst- states no changes  . Unspecified essential hypertension    Discharge Diagnoses:   Principal Problem:  *OA (osteoarthritis) of knee Active Problems:  HYPERTENSION, UNSPECIFIED  CAROTID ARTERY DISEASE  Postop Hyponatremia  Postop Acute blood loss anemia  Postop Transfusion  Fever  Encephalopathy acute  Hypokalemia  Atrial fibrillation with RVR  Thrombocytopenia  Elevated troponin  Abnormal chest CT  Procedure:  Procedure(s) (LRB): TOTAL KNEE ARTHROPLASTY (Left)   Consults: cardiology, pulmonary/intensive care, GI and internal medicine (hospitalists)  HPI: [name] ROUTZAHN is a 78 y.o. year old female with end stage OA of her left knee with progressively worsening pain and dysfunction. She has constant pain, with activity and at rest and significant functional deficits with difficulties even with ADLs. She has had extensive non-op management including analgesics, injections of cortisone and viscosupplements, and home exercise program, but remains in significant pain with significant dysfunction. Radiographs show bone on bone arthritis all 3 compartments with varus deformity and tibial subluxation. She presents now for left Total Knee Arthroplasty.  Laboratory Data: Hospital Outpatient Visit on 08/02/2011    Component Date Value Range Status  . aPTT (seconds) 08/02/2011 33  24-37 Final  . WBC (K/uL) 08/02/2011 7.8  4.0-10.5 Final  . RBC (MIL/uL) 08/02/2011 4.22  3.87-5.11 Final  . Hemoglobin (g/dL) 40/34/7425 95.6  38.7-56.4 Final  . HCT (%) 08/02/2011 38.6  36.0-46.0 Final  . MCV (fL) 08/02/2011 91.5  78.0-100.0 Final  . MCH (pg) 08/02/2011 29.6  26.0-34.0 Final  . MCHC (g/dL) 33/29/5188 41.6  60.6-30.1 Final  . RDW (%) 08/02/2011 15.0  11.5-15.5 Final  . Platelets (K/uL) 08/02/2011 220  150-400 Final  . Sodium (mEq/L) 08/02/2011 139  135-145 Final  . Potassium (mEq/L) 08/02/2011 4.8  3.5-5.1 Final  . Chloride (mEq/L) 08/02/2011 103  96-112 Final  . CO2 (mEq/L) 08/02/2011 28  19-32 Final  . Glucose, Bld (mg/dL) 60/12/9321 96  55-73 Final  . BUN (mg/dL) 22/04/5425 11  0-62 Final  . Creatinine, Ser (mg/dL) 37/62/8315 1.76  1.60-7.37 Final  . Calcium (mg/dL) 10/62/6948 8.9  5.4-62.7 Final  . Total Protein (g/dL) 03/50/0938 7.7  1.8-2.9 Final  . Albumin (g/dL) 93/71/6967 4.1  8.9-3.8 Final  . AST (U/L) 08/02/2011 18  0-37 Final  . ALT (U/L) 08/02/2011 8  0-35 Final  . Alkaline Phosphatase (U/L) 08/02/2011 54  39-117 Final  . Total Bilirubin (mg/dL) 01/11/5101 0.2* 5.8-5.2 Final  . GFR calc non Af Amer (mL/min) 08/02/2011 79* >90 Final  . GFR calc Af Amer (mL/min) 08/02/2011 >90  >90 Final   Comment:                                 The eGFR  has been calculated                          using the CKD EPI equation.                          This calculation has not been                          validated in all clinical                          situations.                          eGFR's persistently                          <90 mL/min signify                          possible Chronic Kidney Disease.  [name] Prothrombin Time (seconds) 08/02/2011 13.2  11.6-15.2 Final  . INR  08/02/2011 0.98  0.00-1.49 Final  . Color, Urine  08/02/2011 YELLOW  YELLOW Final  . APPearance  08/02/2011 CLEAR   CLEAR Final  . Specific Gravity, Urine  08/02/2011 1.011  1.005-1.030 Final  . pH  08/02/2011 6.5  5.0-8.0 Final  . Glucose, UA (mg/dL) 21/30/8657 NEGATIVE  NEGATIVE Final  . Hgb urine dipstick  08/02/2011 NEGATIVE  NEGATIVE Final  . Bilirubin Urine  08/02/2011 NEGATIVE  NEGATIVE Final  . Ketones, ur (mg/dL) 84/69/6295 NEGATIVE  NEGATIVE Final  . Protein, ur (mg/dL) 28/41/3244 NEGATIVE  NEGATIVE Final  . Urobilinogen, UA (mg/dL) 03/30/7251 0.2  6.6-4.4 Final  . Nitrite  08/02/2011 NEGATIVE  NEGATIVE Final  . Leukocytes, UA  08/02/2011 NEGATIVE  NEGATIVE Final   MICROSCOPIC NOT DONE ON URINES WITH NEGATIVE PROTEIN, BLOOD, LEUKOCYTES, NITRITE, OR GLUCOSE <1000 mg/dL.  [name] MRSA, PCR  08/02/2011 NEGATIVE  NEGATIVE Final  . Staphylococcus aureus  08/02/2011 NEGATIVE  NEGATIVE Final   Comment:                                 The Xpert SA Assay (FDA                          approved for NASAL specimens                          only), is one component of                          a comprehensive surveillance                          program.  It is not intended                          to diagnose infection nor to                          guide or monitor treatment.    Basename 08/18/11  1610 08/17/11 0513 08/16/11 0330 08/15/11 2054  HGB 10.5* 10.1* 10.0* 10.2*    Basename 08/18/11 0435 08/17/11 0513  WBC 16.3* 13.9*  RBC 3.63* 3.53*  HCT 32.4* 31.5*  PLT 369 256    Basename 08/18/11 0455 08/17/11 0513  NA 136 133*  K 3.5 3.7  CL 99 100  CO2 27 27  BUN 7 15  CREATININE 0.58 0.62  GLUCOSE 90 94  CALCIUM 8.4 8.3*   No results found for this basename: LABPT:2,INR:2 in the last 72 hours  X-Rays:Dg Chest 1 View  08/13/2011  *RADIOLOGY REPORT*  Clinical Data: Fluid overload.  Coronary artery disease.  CHEST - 1 VIEW  Comparison: CT chest 08/12/2011.  Two-view chest x-ray 08/12/2011.  Findings: The heart is mildly enlarged.  A diffuse interstitial pattern has increased slightly.   Bibasilar airspace disease likely reflects atelectasis.  IMPRESSION:  1.  Mild cardiomegaly with increasing interstitial edema and pulmonary vascular congestion. 2.  Mild bibasilar airspace disease likely reflects atelectasis.  Original Report Authenticated By: Jamesetta Orleans. MATTERN, M.D.   Dg Chest 2 View  08/12/2011  *RADIOLOGY REPORT*  Clinical Data: Nausea, vomiting.  Postop knee replacement. Elevated white count.  CHEST - 2 VIEW  Comparison: 08/02/2011  Findings: Heart is borderline in size.  Mild vascular congestion and interstitial prominence.  No confluent opacities or effusions. No acute bony abnormality.  IMPRESSION: New vascular congestion and interstitial prominence.  Cannot exclude early interstitial edema.  Original Report Authenticated By: Cyndie Chime, M.D.    Chest 2 View  08/02/2011  *RADIOLOGY REPORT*  Clinical Data: Preop radiograph.  Knee surgery.  CHEST - 2 VIEW  Comparison: 06/23/2006  Findings: Heart size is normal.  No pleural effusion or pulmonary edema.  No airspace consolidation.  There is a scoliosis deformity involving the thoracic spine which is convex to the right.  IMPRESSION:  1.  No acute cardiopulmonary abnormalities. 2.  Scoliosis  Original Report Authenticated By: Rosealee Albee, M.D.   Ct Angio Chest W/cm &/or Wo Cm  08/12/2011  *RADIOLOGY REPORT*  Clinical Data: Tachycardia, shortness of breath, total knee arthroplasty.  Resection of posterior mediastinal bronchogenic cyst 04/20/2005.  CT ANGIOGRAPHY CHEST  Technique:  Multidetector CT imaging of the chest using the standard protocol during bolus administration of intravenous contrast. Multiplanar reconstructed images including MIPs were obtained and reviewed to evaluate the vascular anatomy.  Contrast: OMNIPAQUE IOHEXOL 300 MG/ML  SOLN  Comparison: Chest radiograph same date, chest CT 07/19/2005, chest MRI 04/20/2005  Findings: Images are degraded due to motion in the arms overlying the chest. The study is  of adequate technical quality for evaluation for pulmonary embolism up to and including the 3rd order pulmonary arteries. No central focal filling defect is seen to suggest acute pulmonary embolism.  Heart size is upper limits of normal.  No pericardial effusion. Trace pleural effusions are noted with associated compressive atelectasis.  Clips are noted in the infrahilar region.  Fluid density mass like opacity is noted in the infrahilar region measuring 4.5 x 3.5 cm.  Previously, this measured 3.8 x 3.2 cm. No new lymphadenopathy.  Left humeral bone anchor noted.  Right glenohumeral degenerative changes.  No acute osseous abnormality.  Areas of curvilinear dependent atelectasis are noted.  Central airways are patent.  IMPRESSION: Increase in size of fluid density mass like opacity in the infrahilar region.  Given the history of surgery to this area, this could be postoperative, although recurrent bronchogenic cyst or new mass related  to the esophagus is possible.  Trace pleural effusions and associated compressive atelectasis.  No other acute cardiopulmonary process, including pulmonary embolism.  Outpatient nonemergent chest MRI with contrast is recommended. These results will be called to the ordering clinician or representative by the Radiologist Assistant, and communication documented in the PACS Dashboard.  Original Report Authenticated By: Harrel Lemon, M.D.   Dg Abd Acute W/chest  08/12/2011  *RADIOLOGY REPORT*  Clinical Data: Abdominal pain, nausea and vomiting.  ACUTE ABDOMEN SERIES (ABDOMEN 2 VIEW & CHEST 1 VIEW)  Comparison: 07/10/2008  Findings: Cholecystectomy clips noted.  No free air identified. Leftward curvature of the lumbar spine again noted.  No dilated gas filled loop of bowel.  No differential air-fluid level.  Mild cardiomegaly noted.  Central vascular congestion without overt edema.  Patchy retrocardiac possible airspace opacities noted. Trace right pleural effusion.  IMPRESSION:  Nonobstructive bowel gas pattern.  Cardiomegaly with retrocardiac airspace opacity.  Please see dedicated report dictated separately.  Original Report Authenticated By: Harrel Lemon, M.D.    EKG: Orders placed during the hospital encounter of 08/10/11  . EKG 12-LEAD  . EKG 12-LEAD     Hospital Course: Patient was admitted to [facility] and taken to the OR and underwent the above state procedure without complications.  Patient tolerated the procedure well and was later transferred to the recovery room and then to the orthopaedic floor for postoperative care.  They were given PO and IV analgesics for pain control following their surgery.  They were given 24 hours of postoperative antibiotics and started on DVT prophylaxis in the form of Xarelto.   PT and OT were ordered for total joint protocol.  Discharge planning consulted to help with postop disposition and equipment needs. Plan was to go Skilled nursing facility after hospital stay - [facility].  Patient had a decent night on the evening of surgery and started to get up OOB with therapy on day one and walked a few feet.  Blood Pressure is soft - Gave extra fluids and monitor output closely. PCA Dilaudid was discontinued and they were weaned over to PO meds.  Hemovac drain was pulled without difficulty.  Continued to work with therapy into day two but patient was having problems with nausea/vomiting. Medicines were changed and decreased the narcotics. HGB was also down to 7.6 so blood was given.  Dressing was changed on day two and the incision was healing well.   Internal medicine was consulted later that evening due to continued nausea and vomiting.  She developed chest pain postop but did not report this to anyone after surgery initially.  Medicine note: 78 yr old Cf presented initially to [facility] hospital for L total knee arthroscopy 08/10/2011. She was doing well until yesterday when it was noted that she developed  significant pain in her chest and has been constant she hass been out fo the surgery-sharp pain and is hard to cdescribe. The pain is assoc with some sickness on her sotmach-she did not reprt the CP yesterday to anyone yesterday but had 2 episodes of Vomiting yesterday and she was pretty disoriented yesterday-she thought she was home yesterdya and that she was going to go grocery shopping-she was on a dilaudid PCA until 17:30 pm yesterday and this was transitioned to PO Pain meds  She overall is very anxious and a tangential historian with consistent needs to prompt for specific answers and although oriented to person, does seem to have some intermittent  confusion as to where she is  The pain in the chest fever and tachycardia and became symptomatically anemic, necessitating transfusion of 2 U PRBC's.  She does have some central CP and right now states that she has 9/10 CP-burning and aching type of pain in the chest. Usually is totally independent and drives and does stuff for herself  States she has been sick since the the surgery-has nausea, and has had significant nausea. She states that she felt some "pounding" in her heart today, and did have some abd pain and chest pain with radiation into the neck as well. She vomited 2 times over the past 24 hours that has been documented . Has had some headaches as well since she has been here. She was placed on a Cardizem gtt at that time and transferred to Step Down/ICU  Cardiology was consulted also to assist with the patients care. Seen by Dr. Charm Barges: 78 yo WF with h/o paroxysmal AF now with fever, leukocytosis, anemia and atrial fibrillation with RVR after total knee arthroplasty on 08/10/11. I suspect her AF is being driven by acute blood loss anemia and likely infection. CTA chest was negative for PE. I would not be surprised if cardiac enzymes are mildly elevated secondary to demand ischemia from anemia and tachycardia.  - agree with transfer to ICU  -  agree with urine/blood cultures and broad spectrum antibiotics  - unclear etiology of significant hgb drop 12->7, agree with transfusion and following hgb closely  - would control rate with diltiazem ggt for now, I suspect she will improve after blood products and antibiotics  - follow cardiac enzymes tonight  - check TSH, would wait to get echo until after HR has been controlled  - would avoid further doses of lasix for now  POD 3 (08/13/2011) - the patient developed some confusion the night before.  And also was noted to be in rapid Afib. Placed on IV Cardizen gtt and PO Metoprolol. Patient was not on any anticoagulants prior to hospitalization. She was started on Xarelto post operatively for DVT prophylaxis and received it on 5/16 and 17. Medicine requested pharmacy to change to Xarelto and dose for A. fib which would also provide DVT prophylaxis. Discussed with cardiology who agreed.  POD 4 (08/14/2011) - seen by weekend coverage staff.  Reported unchanged leg pain. Negative bowel movement, Negative flatus, Positive chest pain or shortness of breath.  Patient complained of feeling bad. Nausea persisted.  She had been going in and out of A Fib.  Febrile illness: unclear source. UA neg. CTA chest: no acute process. Seems to be defervescing. Blood cultures are negative to date. Vancomycin (MRSA screen +) and Aztreonam per Pharmacy (started 5/18).    POD 5 (08/14/2101) - Patient was having problems with continued nausea/vomiting, pain in the knee, requiring pain medications and drop in HGB again requiring blood trasnfusion. ABLA - HGB down to 7.7 - Blood given. GI Consult called - Made NPO. DVT Prophylaxis - Xarelto - Placed on hold pending GI consult. Was on 20 mg Dose for Afib to be given tonight at 1700.  CT Chest done and was reported to Dr. Lequita Halt that there was a "fluid collection" around the esophagus. GI consult called to [facility] , group on call at [facility] today.   Seen by Dr. Dulce Sellar: [name] [name] is a 78 y.o. female we were asked to see for drop in hemoglobin and for evaluation of abnormal CT scan (chest). She is 5  days post-op from left knee replacement. Since her surgery, she has had persistent troubles with left knee pain as well as troubles with right-sided chest pain. She has poor appetite post-operatively, but specifically denies any dysphagia, abdominal pain. Hgb has dropped about 2 grams post-operative. However, she has not had any blood in her stool; in fact, she denies having any bowel movements since surgery. Prior history significant for bronchogenic cyst requiring surgical treatment at [facility] in 2007. Chest CT done recently to evaluate her chest pain showed right infrahilar cystic-like mass, a little bit larger than prior comparison CT in 2007. She denies having had any prior endoscopy.  Impression:  1. Acute drop in hemoglobin. Suspect post-operative changes. Could perhaps have had some bleeding into her previously noted (and operated on) bronchogenic cyst. Doubt GI blood loss; patient has had no blood in stool and has not had a bowel movement in several days.  2. Abnormal CT chest. Suspect post-operative changes from prior bronchogenic cyst versus residual/recurrent cyst. Doubt primary esophageal problem.  Plan:  1. Would not suggest any endoscopic evaluation at this time, in absence of overt GI bleeding.  2. If chest discomfort persists, could consider pulmonary consultation for evaluation of right infrahilar lesion, with history of prior bronchogenic cyst surgery 2007.  3. Will sign off; please call with questions. Cards recommendations: ASSESSMENT AND PLAN: 78 yo WF with h/o paroxysmal AF with fever, leukocytosis, anemia and atrial fibrillation with RVR with mild elevation of troponin after total knee arthroplasty on 08/10/11. Now with reproducible right chest wall pain, left leg pain, NSR with PVCs, anemia.  1. Atrial fibrillation: Now in sinus rhythm. IV cardizem was  stopped. Will add Cardizem CD 120 mg po Qdaily. Continue beta blocker. Would not anticoagulate at this time with recent surgical procedure. Will replace potassium. Check magnesium.  2. Elevated troponin: Likely secondary to demand ischemia with RVR, anemia, infection. Chest pain is reproducible with palpation and likely non-cardiac. No further ischemic workup at this time. Plans for echo today.  3. Post-op anemia: Consider transfusion given age and other comorbidities.  Appreciated GI consult. Doubtful for GI source for drop in HGB and no need for scope. Resumed Xarelto as was previously ordered (20 mg).  She was still having some pain. She had been using Ultram and Robaxin. Family in room at that time and stated that she had been on Vicodin at home and tolerated it well. Started back a low dose Vicodin for break thru pain.  Medicine also consulted Pulmonary for evaluation:   HPI: 78 y/o F with PMH of HLD, CAD, Hypothyroidism, Afib, Bronchogenic cyst removal 4/07 admitted on 5/15 for L total knee arthroplasty. 5/16 noted to have sharp pain in her chest and anemia. CTA of chest negative for PE but noted an increase in fluid density mass like opacity in infrahilar region. Rx'd with PRBC's and 500 ml NS for anemia (hgb7.6). Following day had bilateral airspace disease on CXR. She subsequently developed Afib with RVR on 5/17 and transferred to ICU. Pt was diuresed and Cardiology consulted. 5/18 noted to have intermittent fevers (102.6) and confusion and episodes of vomiting. 5/20 Chest pain continued/reproducable with palpation. PCCM consulted 5/20 for assistance with mass like opacity on CTA, SOB, hypoxemia.  A:  Febrile Illness of unclear etiology  P:  -Abx per Primary SVC  -likely can narrow / d/c as fever curve improved and cultures to date are negative  Canary Brim, NP-C  [facility]  Pgr: [phone]  08/15/2011, 12:31 PM  Seen on CCM rounds this morning with resident MD or ACNP  above. Pt examined and database reviewed. I agree with above findings, assessment and plan as reflected in the note above which I have edited. Her CT chest findings are probably unrelated to the observed drop in Hct. There is no need for any intervention for what is likely a recurrent bronchogenic cyst or persistent post-operative changes related to prior resection. Her medical problems are well-addressed by [facility]. PCCM will sign off. Please call if we can be of further assistance  Billy Fischer, MD; PCCM service; Mobile [phone]   POD 6 (08/16/2011) - She stated that she was doing OK. Still with pain. Nausea improving. Still has complaints of the chest wall soreness. HGB back up after blood from 7.7 up to 10.2 last evening. HGB 10.0 this morning - stable. Na+ improving also from 125 up to 128.  Cards: ASSESSMENT AND PLAN: 78 yo WF with h/o paroxysmal AF with fever, leukocytosis, anemia and atrial fibrillation with RVR with mild elevation of troponin after total knee arthroplasty on 08/10/11. Now with reproducible right chest wall pain, left leg pain, NSR with PVCs, anemia.  1. Atrial fibrillation: Maintaining sinus rhythm. She is now on Cardizem CD 120 mg po Qdaily. Continue beta blocker. Xarelto started back by ortho.  2. Elevated troponin: Likely secondary to demand ischemia with RVR, anemia, infection. Chest pain is reproducible with palpation and likely non-cardiac. No further ischemic workup at this time. Echo was poor study with possible slightly depressed LV systolic function.  3. Post-op anemia: Likely secondary to intra-operative losses. No evidence of GI bleeding. s/p transfusion yesterday.  Will sign off for now. Please call back with questions.  Medicine: Disposition: Overall improving. Stable for transfer to a telemetry bed, from a medical standpoint. Will follow.  POD 7 (08/17/2011) - Medicine continued to follow patient: Plan:  1. S/P Left Total Knee Arthoplasty 5/15: Management per  primary service.  2. Febrile illness: unclear source. UCx neg. Blood cultures x2: Negative to date. CTA chest: no acute process. Defervesced. Since no clear source clinically and cultures thus far negative and patient does not look toxic/septic, will discontinue antibiotics and monitor. Obviously if patient becomes febrile again, will need further evaluation and management. 3. Afib with RVR: pptd by fever, pain, ABLA,: Reverted to sinus rhythm. Continue oral Cardizem and metoprolol. Discussed with cardiology/Dr. Verne Carrow and agreed to continue Xarelto at 20 mg daily for anticoagulation. 4. Aute posthemorrhagic Anemia (ABLA): Likely secondary to intraoperative losses. Improved after PRBCs. No overt source of bleeding. Follow CBCs closely.s/p 2 units  5. Hypokalemia: Repleted. 6. Encephalopathy 2/2 to pain, pain meds, acute illness. No focal deficits. Resolved. 7. Thrombocytopenia: probably 2/2 ABLA, acute illness; Monitor. Improving 8. Nausea and Vomiting: Improving. Continue scheduled Zofran for additional day, then can be changed to when necessary. Probably secondary to pain and pain medications. 9. Elevated Troponin: 2/2 to Demand ischemia from Rapid Afib, Fever, Anemia: Cardiology following. 2-D echocardiogram was a poor study. Cardiology does not recommend any further ischemia workup. 10. Hyponatremia: ? Secondary to poor oral intake with associated dehydration and diuretics. Diuretics were discontinued. Continue gentle normal saline for additional day and consider discontinuing fluids tomorrow. Follow BMP tomorrow. 11. HTN: reasonable control. 12. Fluid density mass like opacity in the infrahilar region on CTA chest, increasing in size compared to prior:? Enlarging bronchogenic cyst. Pulmonology input appreciated and do not indicate any intervention at this time. 13. Right-sided chest pain: Likely  musculoskeletal/costochondritis. Agree with topical NSAIDs and monitor. Reluctant to use  oral NSAIDs secondary to nausea and vomiting, on anticoagulation 14. Hypothyroid: Continue Synthroid. TSH: 0.342. Recommend repeating in 4-6 weeks. 15. Full Code Disposition: Overall improving. Stable for transfer to a telemetry bed, from a medical standpoint. Will follow.  16. Leg pain ,doppler to r/o DVT ,  Disposition: Overall improving. Stable for discharge from a medical standpoint ,  from a medical standpoint.  Follow up cbc in one week  Follow up with cardiology in 2 weeks The patient was also seen in rounds by Dr. Lequita Halt and overall she continued to improve. Patient reported pain as mild. She was feeling better today.  HGB had been stable past two days - HGB 10.2, 10.0, and 10.1.  Possible transfer to SNF tomorrow.  POD 8 (08/18/2011) - Patient reported pain as mild. Patient seen in rounds with Dr. Lequita Halt. Patient is sitting up in bed without complaints. She is doing better. Patient is ready to go to [facility] and [facility].    Discharge Medications: Prior to Admission medications   Medication Sig Start Date End Date Taking? Authorizing Provider  ALPRAZolam Prudy Feeler) 0.5 MG tablet Take 0.5 mg by mouth 3 (three) times daily as needed. anxiety   Yes Historical Provider, MD  esomeprazole (NEXIUM) 40 MG capsule Take 40 mg by mouth daily.    Yes Historical Provider, MD  gabapentin (NEURONTIN) 300 MG capsule Take 2 capsules by mouth at bedtime. & at bedtime 01/04/11  Yes Historical Provider, MD  levothyroxine (SYNTHROID, LEVOTHROID) 175 MCG tablet Take 175 mcg by mouth daily.    Yes Historical Provider, MD  simvastatin (ZOCOR) 20 MG tablet Take 20 mg by mouth daily.    Yes Historical Provider, MD  tolterodine (DETROL LA) 4 MG 24 hr capsule Take 4 mg by mouth daily.    Yes Historical Provider, MD  acetaminophen (TYLENOL) 325 MG tablet Take 2 tablets (650 mg total) by mouth every 6 (six) hours as needed (or Fever >/= 101). 08/18/11 08/17/12  Tiersa Dayley Julien Girt, PA  bisacodyl (DULCOLAX)  10 MG suppository Place 1 suppository (10 mg total) rectally daily as needed. 08/18/11 08/28/11  Levon Penning, PA  diltiazem (CARDIZEM CD) 120 MG 24 hr capsule Take 1 capsule (120 mg total) by mouth daily. 08/18/11 08/17/12  Montrel Donahoe, PA  docusate sodium 100 MG CAPS Take 100 mg by mouth 2 (two) times daily. 08/18/11 08/28/11  Christ Fullenwider, PA  HYDROcodone-acetaminophen (NORCO) 5-325 MG per tablet Take 1-2 tablets by mouth every 4 (four) hours as needed. 08/18/11 08/28/11  Earnie Bechard, PA  IRON CR PO Take 1 tablet by mouth daily.     Historical Provider, MD  methocarbamol (ROBAXIN) 500 MG tablet Take 1 tablet (500 mg total) by mouth every 8 (eight) hours as needed. 08/18/11 08/28/11  Raynesha Tiedt, PA  metoprolol tartrate (LOPRESSOR) 12.5 mg TABS Take 0.5 tablets (12.5 mg total) by mouth 2 (two) times daily. 08/18/11   Angelis Gates, PA  polyethylene glycol (MIRALAX / GLYCOLAX) packet Take 17 g by mouth daily as needed. 08/18/11 08/21/11  Vannesa Abair, PA  rivaroxaban 20 MG TABS Take 20 mg by mouth daily. Take for two more weeks, then discontinue Xarelto.  After completing the Xarelto, patient should resume their 81 mg Aspirin. 08/18/11   Rondale Nies Julien Girt, PA    Diet: Cardiac diet Activity:WBAT Follow-up: Dr. Lequita Halt in 2 weeks. Follow up with Cardiology in 2 weeks. Disposition - Skilled nursing facility Discharged Condition: Improving  Discharge Orders    Future Orders Please Complete By Expires   Diet - low sodium heart healthy      Call MD / Call 911      Comments:   If you experience chest pain or shortness of breath, CALL 911 and be transported to the hospital emergency room.  If you develope a fever above 101 F, pus (white drainage) or increased drainage or redness at the wound, or calf pain, call your surgeon's office.   Discharge instructions      Comments:   Pick up stool softner and laxative for home. Do not submerge incision under  water. May shower. Continue to use ice for pain and swelling from surgery.  Take Xarelto for two more weeks, then discontinue Xarelto.  Once the patient has completed the Xarelto, they may resume the 81 mg Aspirin.  When discharged from the skilled rehab facility, please have the facility set up the patient's Home Health Physical Therapy prior to being released.  Also provide the patient with their medications at time of release from the facility to include their pain medication, the muscle relaxants, and their blood thinner medication.  If the patient is still at the rehab facility at time of follow up appointment, please also assist the patient in arranging follow up appointment in our office and any transportation needs.     Constipation Prevention      Comments:   Drink plenty of fluids.  Prune juice may be helpful.  You may use a stool softener, such as Colace (over the counter) 100 mg twice a day.  Use MiraLax (over the counter) for constipation as needed.   Increase activity slowly as tolerated      Patient may shower      Comments:   You may shower without a dressing once there is no drainage.  Do not wash over the wound.  If drainage remains, do not shower until drainage stops.   Driving restrictions      Comments:   No driving until released by the physician.   Lifting restrictions      Comments:   No lifting until released by the physician.   TED hose      Comments:   Use stockings (TED hose) for 3 weeks on both leg(s).  You may remove them at night for sleeping.   Change dressing      Comments:   Change dressing daily with sterile 4 x 4 inch gauze dressing and apply TED hose. Do not submerge the incision under water.   Do not put a pillow under the knee. Place it under the heel.      Do not sit on low chairs, stoools or toilet seats, as it may be difficult to get up from low surfaces        Medication List  As of 08/18/2011  8:11 AM   STOP taking these medications          aspirin EC 81 MG tablet      CALCIUM 600 PO      conjugated estrogens vaginal cream      fish oil-omega-3 fatty acids 1000 MG capsule      Garlic Oil 1000 MG Caps      HYDROcodone-acetaminophen 5-500 MG per tablet      multivitamin tablet      oxaprozin 600 MG tablet      vitamin A 8000 UNIT capsule      vitamin C 500 MG tablet  Vitamin D3 1000 UNITS Caps      zinc gluconate 50 MG tablet         TAKE these medications         acetaminophen 325 MG tablet   Commonly known as: TYLENOL   Take 2 tablets (650 mg total) by mouth every 6 (six) hours as needed (or Fever >/= 101).      ALPRAZolam 0.5 MG tablet   Commonly known as: XANAX   Take 0.5 mg by mouth 3 (three) times daily as needed. anxiety      bisacodyl 10 MG suppository   Commonly known as: DULCOLAX   Place 1 suppository (10 mg total) rectally daily as needed.      diltiazem 120 MG 24 hr capsule   Commonly known as: CARDIZEM CD   Take 1 capsule (120 mg total) by mouth daily.      DSS 100 MG Caps   Take 100 mg by mouth 2 (two) times daily.      esomeprazole 40 MG capsule   Commonly known as: NEXIUM   Take 40 mg by mouth daily.      gabapentin 300 MG capsule   Commonly known as: NEURONTIN   Take 2 capsules by mouth at bedtime. & at bedtime      HYDROcodone-acetaminophen 5-325 MG per tablet   Commonly known as: NORCO   Take 1-2 tablets by mouth every 4 (four) hours as needed.      IRON CR PO   Take 1 tablet by mouth daily.      levothyroxine 175 MCG tablet   Commonly known as: SYNTHROID, LEVOTHROID   Take 175 mcg by mouth daily.      methocarbamol 500 MG tablet   Commonly known as: ROBAXIN   Take 1 tablet (500 mg total) by mouth every 8 (eight) hours as needed.      metoprolol tartrate 12.5 mg Tabs   Commonly known as: LOPRESSOR   Take 0.5 tablets (12.5 mg total) by mouth 2 (two) times daily.      polyethylene glycol packet   Commonly known as: MIRALAX / GLYCOLAX   Take 17 g by mouth  daily as needed.      Rivaroxaban 20 MG Tabs   Take 20 mg by mouth daily. Take for two more weeks, then discontinue Xarelto.  After completing the Xarelto, patient should resume their 81 mg Aspirin.      simvastatin 20 MG tablet   Commonly known as: ZOCOR   Take 20 mg by mouth daily.      tolterodine 4 MG 24 hr capsule   Commonly known as: DETROL LA   Take 4 mg by mouth daily.           Follow-up Information    Follow up with Loanne Drilling, MD. Schedule an appointment as soon as possible for a visit in 2 weeks. (SNF to assist with follow up appointment.)    Contact information:   [facility] [address], [address] [place] [address] [phone]          Signed: Patrica Duel 08/18/2011, 8:11 AM

## 2011-08-18 NOTE — Progress Notes (Signed)
Subjective: 8 Days Post-Op Procedure(s) (LRB): TOTAL KNEE ARTHROPLASTY (Left) Patient reports pain as mild.   Patient seen in rounds with Dr. Lequita Halt. Patient is sitting up in bed without complaints.  She is doing better. Patient is ready to go to [facility] and [facility].   Objective: Vital signs in last 24 hours: Temp:  [98.2 F (36.8 C)-98.5 F (36.9 C)] 98.5 F (36.9 C) (05/23 0531) Pulse Rate:  [70-92] 92  (05/23 0531) Resp:  [16-18] 16  (05/23 0531) BP: (135-158)/(60-72) 156/71 mmHg (05/23 0531) SpO2:  [94 %-97 %] 94 % (05/23 0531)  Intake/Output from previous day:  Intake/Output Summary (Last 24 hours) at 08/18/11 0749 Last data filed at 08/18/11 0559  Gross per 24 hour  Intake 1928.67 ml  Output      0 ml  Net 1928.67 ml     Labs:  Basename 08/18/11 0435 08/17/11 0513 08/16/11 0330 08/15/11 2054  HGB 10.5* 10.1* 10.0* 10.2*    Basename 08/18/11 0435 08/17/11 0513  WBC 16.3* 13.9*  RBC 3.63* 3.53*  HCT 32.4* 31.5*  PLT 369 256    Basename 08/18/11 0455 08/17/11 0513  NA 136 133*  K 3.5 3.7  CL 99 100  CO2 27 27  BUN 7 15  CREATININE 0.58 0.62  GLUCOSE 90 94  CALCIUM 8.4 8.3*   No results found for this basename: LABPT:2,INR:2 in the last 72 hours  EXAM: General - Patient is Alert, Appropriate and Oriented Extremity - Neurovascular intact Sensation intact distally Incision - dry, no active drainage, blister noted on proximal medial aspect of incision and small blisters noted on the lateral side.  They appear stable and are drying up. Motor Function - intact, moving foot and toes well on exam.   Assessment/Plan: 8 Days Post-Op Procedure(s) (LRB): TOTAL KNEE ARTHROPLASTY (Left) Procedure(s) (LRB): TOTAL KNEE ARTHROPLASTY (Left) Past Medical History  Diagnosis Date  . Other and unspecified hyperlipidemia   . Carotid artery disease     50-69% stenosis R.ICA,12.11        4/13 carotid dopplar EPIC  . Hypothyroidism   . Anemia   . GERD  (gastroesophageal reflux disease)   . Arthritis   . Atrial fibrillation 4/07    Remote h/o amiodarone use, recurred in 07/2011 post op from knee arthroplasty  . Shortness of breath 4/07    with  walking long distances since removal bronchogenic cyst- states no changes  . Unspecified essential hypertension    Principal Problem:  *OA (osteoarthritis) of knee Active Problems:  HYPERTENSION, UNSPECIFIED  CAROTID ARTERY DISEASE  Postop Hyponatremia  Postop Acute blood loss anemia  Postop Transfusion  Fever  Encephalopathy acute  Hypokalemia  Atrial fibrillation with RVR  Thrombocytopenia  Elevated troponin  Abnormal chest CT   Discharge to SNF Diet - Cardiac diet Follow up - in 2 weeks Activity - WBAT Disposition - Skilled nursing facility Condition Upon Discharge - Improving D/C Meds - See DC Summary DVT Prophylaxis - Xarelto  Westlyn Glaza 08/18/2011, 7:49 AM

## 2011-08-19 LAB — CULTURE, BLOOD (ROUTINE X 2)
Culture  Setup Time: 201305181445
Culture: NO GROWTH

## 2011-08-31 DIAGNOSIS — Z9289 Personal history of other medical treatment: Secondary | ICD-10-CM

## 2011-08-31 HISTORY — DX: Personal history of other medical treatment: Z92.89

## 2011-09-26 ENCOUNTER — Encounter: Payer: Self-pay | Admitting: Cardiology

## 2011-09-26 ENCOUNTER — Ambulatory Visit (INDEPENDENT_AMBULATORY_CARE_PROVIDER_SITE_OTHER): Payer: Medicare Other | Admitting: Cardiology

## 2011-09-26 ENCOUNTER — Ambulatory Visit: Payer: Medicare Other | Admitting: Cardiology

## 2011-09-26 VITALS — BP 113/74 | HR 70 | Ht 65.0 in | Wt 153.0 lb

## 2011-09-26 DIAGNOSIS — I10 Essential (primary) hypertension: Secondary | ICD-10-CM

## 2011-09-26 DIAGNOSIS — I6529 Occlusion and stenosis of unspecified carotid artery: Secondary | ICD-10-CM

## 2011-09-26 DIAGNOSIS — I4891 Unspecified atrial fibrillation: Secondary | ICD-10-CM

## 2011-09-26 MED ORDER — RIVAROXABAN 20 MG PO TABS: 20.0000 mg | ORAL_TABLET | Freq: Every day | ORAL | Status: DC

## 2011-09-26 NOTE — Patient Instructions (Addendum)
Your physician recommends that you schedule a follow-up appointment in: 3 months with Dr. Andee Lineman .  Your physician recommends that you continue on your current medications as directed. Please refer to the Current Medication list given to you today. In addition to starting  Xarelto 20 mg daily.  See [name] in 3-4 weeks to discuss xarelto.

## 2011-09-26 NOTE — Assessment & Plan Note (Signed)
She has 60-79% right stenosis and 40-59% left stenosis. I reviewed these results. She'll have followup Doppler in October.

## 2011-09-26 NOTE — Assessment & Plan Note (Signed)
The blood pressure is at target. No change in medications is indicated. We will continue with therapeutic lifestyle changes (TLC).  

## 2011-09-26 NOTE — Progress Notes (Signed)
HPI The patient presents for followup of atrial fibrillation. She was hospitalized for knee surgery in May. We saw her at that time because she had atrial fibrillation. She was sent home on Xarelto. Following that she was hospitalized at [facility] because of apparent nausea. She has a left knee cellulitis. She's reported to have been in atrial fibrillation at that time.  She has since gone to rehabilitation and is now back home. She is recovering slowly with her knee. She does not notice any palpitations. She never has. She has no presyncope or syncope. She denies any chest pressure, neck or arm discomfort. She doesn't have any new shortness of breath, PND or orthopnea.  She has some chronic dyspnea but she's had since she had a mass removed lung surgery some years ago.  Allergies  Allergen Reactions  . Morphine   . Penicillins Itching    Current Outpatient Prescriptions  Medication Sig Dispense Refill  . acetaminophen (TYLENOL) 325 MG tablet Take 2 tablets (650 mg total) by mouth every 6 (six) hours as needed (or Fever >/= 101).  60 tablet  0  . ALPRAZolam (XANAX) 0.5 MG tablet Take 0.5 mg by mouth 3 (three) times daily as needed. anxiety      . diltiazem (CARDIZEM CD) 120 MG 24 hr capsule Take 1 capsule (120 mg total) by mouth daily.  30 capsule  0  . esomeprazole (NEXIUM) 40 MG capsule Take 40 mg by mouth daily.       [name] gabapentin (NEURONTIN) 300 MG capsule Take 2 capsules by mouth at bedtime. & at bedtime      . IRON CR PO Take 1 tablet by mouth daily.       [name] levothyroxine (SYNTHROID, LEVOTHROID) 175 MCG tablet Take 175 mcg by mouth daily.       . metoprolol tartrate (LOPRESSOR) 12.5 mg TABS Take 0.5 tablets (12.5 mg total) by mouth 2 (two) times daily.  60 tablet  0  . rivaroxaban 20 MG TABS Take 20 mg by mouth daily. Take for two more weeks, then discontinue Xarelto.  After completing the Xarelto, patient should resume their 81 mg Aspirin.  14 tablet  0  . simvastatin (ZOCOR) 20 MG  tablet Take 20 mg by mouth daily.       [name] tolterodine (DETROL LA) 4 MG 24 hr capsule Take 4 mg by mouth daily.         Past Medical History  Diagnosis Date  . Other and unspecified hyperlipidemia   . Carotid artery disease     50-69% stenosis R.ICA,12.11        4/13 carotid dopplar EPIC  . Hypothyroidism   . Anemia   . GERD (gastroesophageal reflux disease)   . Arthritis   . Atrial fibrillation 4/07    Remote h/o amiodarone use, recurred in 07/2011 post op from knee arthroplasty  . Shortness of breath 4/07    with  walking long distances since removal bronchogenic cyst- states no changes  . Unspecified essential hypertension     Past Surgical History  Procedure Date  . Tonsillectomy   . Cholecystectomy   . Appendectomy   . Thoracotomy 4/07    removal bronchogenic cyst  . Total knee arthroplasty 08/10/2011    Procedure: TOTAL KNEE ARTHROPLASTY;  Surgeon: Loanne Drilling, MD;  Location: [facility] ORS;  Service: Orthopedics;  Laterality: Left;    ROS:  As stated in the HPI and negative for all other systems.  PHYSICAL EXAM Ht 5'  5" (1.651 m)  Wt 153 lb (69.4 kg)  BMI 25.46 kg/m2 PHYSICAL EXAM GEN:  No distress NECK:  No jugular venous distention at 90 degrees, waveform within normal limits, carotid upstroke brisk and symmetric, no bruits, no thyromegaly LYMPHATICS:  No cervical adenopathy LUNGS:  Clear to auscultation bilaterally BACK:  No CVA tenderness CHEST:  Unremarkable HEART:  S1 and S2 within normal limits, no S3, no S4, no clicks, no rubs, no murmurs ABD:  Positive bowel sounds normal in frequency in pitch, no bruits, no rebound, no guarding, unable to assess midline mass or bruit with the patient seated. EXT:  2 plus pulses throughout, moderate edema, no cyanosis no clubbing, knee with swelling but well healed surgical scar SKIN:  No rashes no nodules NEURO:  Cranial nerves II through XII grossly intact, motor grossly intact throughout PSYCH:  Cognitively intact,  oriented to person place and time   ASSESSMENT AND PLAN

## 2011-09-26 NOTE — Assessment & Plan Note (Addendum)
The patient has now had recurrent atrial fibrillation.  I looked back at multiple charts and she's had this history for several years.  [name] has a CHA2DS2 - VASc score of 5 with a risk of stroke of 6.7%.  She is in sinus rhythm now by exam. However, given her risk and recurrence of this she should continue Xarelto.  I explored the risks benefits with the patient and she understands and agrees to proceed with full dose anticoagulation.

## 2011-09-27 ENCOUNTER — Ambulatory Visit: Payer: Medicare Other | Attending: Orthopedic Surgery | Admitting: Physical Therapy

## 2011-09-27 ENCOUNTER — Telehealth: Payer: Self-pay | Admitting: *Deleted

## 2011-09-27 DIAGNOSIS — M6281 Muscle weakness (generalized): Secondary | ICD-10-CM | POA: Insufficient documentation

## 2011-09-27 DIAGNOSIS — M25669 Stiffness of unspecified knee, not elsewhere classified: Secondary | ICD-10-CM | POA: Insufficient documentation

## 2011-09-27 DIAGNOSIS — R5381 Other malaise: Secondary | ICD-10-CM | POA: Insufficient documentation

## 2011-09-27 DIAGNOSIS — IMO0001 Reserved for inherently not codable concepts without codable children: Secondary | ICD-10-CM | POA: Insufficient documentation

## 2011-09-27 DIAGNOSIS — M25569 Pain in unspecified knee: Secondary | ICD-10-CM | POA: Insufficient documentation

## 2011-09-27 NOTE — Telephone Encounter (Signed)
Patient state's if she has to be on this for life she can't afford this but if its temporary, she can get it. Please advise if this is long term or temporary. Her son is willing to help if its necessary. Please advise.

## 2011-09-28 ENCOUNTER — Ambulatory Visit: Payer: Medicare Other | Admitting: Physical Therapy

## 2011-09-28 MED ORDER — RIVAROXABAN 20 MG PO TABS: 20.0000 mg | ORAL_TABLET | Freq: Every day | ORAL | Status: DC

## 2011-09-28 NOTE — Telephone Encounter (Signed)
She can stop the Xarelto and start warfarin.  She should follow up in the warfarin clinic.

## 2011-09-28 NOTE — Telephone Encounter (Signed)
Spoke with patient and she doesn't want to start coumadin prefers to take the xarelto saying she can get it much cheaper from the mail order pharmacy. Patient will still pick up samples and rx.

## 2011-09-28 NOTE — Telephone Encounter (Signed)
Patient called back requesting samples of xarelto and 90 day supply rx to pick up. #10 xarelto 20mg  samples and 90 day supply rx  left up front for patient to pick up. Patient aware. Awaiting response from MD on previous message.

## 2011-09-28 NOTE — Telephone Encounter (Signed)
Made attempt to call patient and line was busy.

## 2011-09-28 NOTE — Telephone Encounter (Signed)
Mrs. Spinola states that she wants Dr. Antoine Poche to write prescription for a 90 day supply so that she can mail Her prescription into a mail order. She would like to pick up samples.

## 2011-10-03 ENCOUNTER — Ambulatory Visit: Payer: Medicare Other | Admitting: Physical Therapy

## 2011-10-05 ENCOUNTER — Ambulatory Visit: Payer: Medicare Other | Admitting: Physical Therapy

## 2011-10-07 ENCOUNTER — Telehealth: Payer: Self-pay | Admitting: *Deleted

## 2011-10-07 ENCOUNTER — Ambulatory Visit: Payer: Medicare Other | Admitting: Physical Therapy

## 2011-10-07 MED ORDER — RIVAROXABAN 20 MG PO TABS: 20.0000 mg | ORAL_TABLET | Freq: Every day | ORAL | Status: DC

## 2011-10-07 NOTE — Telephone Encounter (Signed)
Patient requesting samples of xarelto 20 mg.

## 2011-10-10 ENCOUNTER — Ambulatory Visit: Payer: Medicare Other | Admitting: Physical Therapy

## 2011-10-12 ENCOUNTER — Ambulatory Visit: Payer: Medicare Other | Admitting: Physical Therapy

## 2011-10-17 ENCOUNTER — Ambulatory Visit: Payer: Medicare Other | Admitting: Physical Therapy

## 2011-10-17 ENCOUNTER — Telehealth: Payer: Self-pay | Admitting: *Deleted

## 2011-10-17 NOTE — Telephone Encounter (Signed)
Pt is to stay on Xarelto for atrial fib.  LMOM for pt to keep appt for 7/23.  She can get samples then if she needs them.

## 2011-10-17 NOTE — Telephone Encounter (Signed)
[name] [name] called wanting to speak with [name] about Xarelto. States that she has been taken off of it and does not Understand why she has to come for an appointment on 10-18-2011.

## 2011-10-18 ENCOUNTER — Ambulatory Visit (INDEPENDENT_AMBULATORY_CARE_PROVIDER_SITE_OTHER): Payer: Medicare Other | Admitting: *Deleted

## 2011-10-18 DIAGNOSIS — I4891 Unspecified atrial fibrillation: Secondary | ICD-10-CM

## 2011-10-18 NOTE — Patient Instructions (Addendum)
Pt was started on Xarelto for atrial fib on 10/18/11.  Pt had been on Xarelto prior to TKR but surgeon and PMD told her she could stop after 21 days.    She was placed on Xarelton for Afib not pre-op anticoagulation  Pt here today to discuss restarting med.  She does not want to take Xarelto, but is will to do so until she sees Dr Andee Lineman in Sept.  She is not willing to try coumadin even though the cost of Xarelto is high for her to afford.  Reviewed patients medication list.  Pt is not currently on any combined P-gp and strong CYP3A4 inhibitors/inducers (ketoconazole, traconazole, ritonavir, carbamazepine, phenytoin, rifampin, St. John's wort).  Reviewed labs.  SCr  0.62, Weight 153, CrCl- 80.8.  Dose is appropriate based on CrCl.   Hgb and HCT Hbg 10.5  HCt 32.4 and pt was transfused.  A full discussion of the nature of anticoagulants has been carried out.  A benefit/risk analysis has been presented to the patient, so that they understand the justification for choosing anticoagulation with Xarelto at this time.  The need for compliance is stressed.  Pt is aware to take the medication once daily with the largest meal of the day.  Side effects of potential bleeding are discussed, including unusual colored urine or stools, coughing up blood or coffee ground emesis, nose bleeds or serious fall or head trauma.  Discussed signs and symptoms of stroke. The patient should avoid any OTC items containing aspirin or ibuprofen.  Avoid alcohol consumption.   Call if any signs of abnormal bleeding.  Discussed financial obligations and resolved any difficulty in obtaining medication.  Next lab test test in 6 months.   Samples of Xarelto 20mg  given to pt to take 1 daily until she sees MD 9/13.

## 2011-10-19 ENCOUNTER — Ambulatory Visit: Payer: Medicare Other | Admitting: Physical Therapy

## 2011-10-20 ENCOUNTER — Ambulatory Visit: Payer: Medicare Other | Admitting: Physical Therapy

## 2011-10-21 ENCOUNTER — Encounter: Payer: Medicare Other | Admitting: *Deleted

## 2011-10-24 ENCOUNTER — Ambulatory Visit: Payer: Medicare Other | Admitting: Physical Therapy

## 2011-10-26 ENCOUNTER — Ambulatory Visit: Payer: Medicare Other | Admitting: Gastroenterology

## 2011-10-26 ENCOUNTER — Ambulatory Visit: Payer: Medicare Other | Attending: Physician Assistant | Admitting: Physical Therapy

## 2011-10-26 DIAGNOSIS — M6281 Muscle weakness (generalized): Secondary | ICD-10-CM | POA: Insufficient documentation

## 2011-10-26 DIAGNOSIS — M25569 Pain in unspecified knee: Secondary | ICD-10-CM | POA: Insufficient documentation

## 2011-10-26 DIAGNOSIS — IMO0001 Reserved for inherently not codable concepts without codable children: Secondary | ICD-10-CM | POA: Insufficient documentation

## 2011-10-26 DIAGNOSIS — M25669 Stiffness of unspecified knee, not elsewhere classified: Secondary | ICD-10-CM | POA: Insufficient documentation

## 2011-10-28 ENCOUNTER — Ambulatory Visit: Payer: Medicare Other | Attending: Physician Assistant | Admitting: Physical Therapy

## 2011-10-28 DIAGNOSIS — IMO0001 Reserved for inherently not codable concepts without codable children: Secondary | ICD-10-CM | POA: Insufficient documentation

## 2011-10-28 DIAGNOSIS — M25669 Stiffness of unspecified knee, not elsewhere classified: Secondary | ICD-10-CM | POA: Insufficient documentation

## 2011-10-28 DIAGNOSIS — M25569 Pain in unspecified knee: Secondary | ICD-10-CM | POA: Insufficient documentation

## 2011-10-28 DIAGNOSIS — M6281 Muscle weakness (generalized): Secondary | ICD-10-CM | POA: Insufficient documentation

## 2011-10-28 DIAGNOSIS — R5381 Other malaise: Secondary | ICD-10-CM | POA: Insufficient documentation

## 2011-10-31 ENCOUNTER — Ambulatory Visit: Payer: Medicare Other | Admitting: Physical Therapy

## 2011-11-02 ENCOUNTER — Ambulatory Visit: Payer: Medicare Other | Admitting: Physical Therapy

## 2011-11-04 ENCOUNTER — Ambulatory Visit: Payer: Medicare Other | Admitting: Physical Therapy

## 2011-11-07 ENCOUNTER — Ambulatory Visit: Payer: Medicare Other | Admitting: Physical Therapy

## 2011-11-09 ENCOUNTER — Ambulatory Visit: Payer: Medicare Other | Admitting: Physical Therapy

## 2011-11-10 ENCOUNTER — Ambulatory Visit: Payer: Medicare Other | Admitting: Cardiology

## 2011-11-10 ENCOUNTER — Encounter: Payer: Medicare Other | Admitting: Physical Therapy

## 2011-11-11 ENCOUNTER — Telehealth: Payer: Self-pay | Admitting: *Deleted

## 2011-11-11 NOTE — Telephone Encounter (Signed)
Spoke with patient and she states that she has noticed spitting up blood for the past couple of weeks. Nurse advised patient per Gene to stop the xarelto and come for office visit with Degent on next week. Patient informed nurse that her PCP had already stopped her aspirin. Nurse advised patient that if she still noticed blood after xarelto stopped, she needed to call her PCP. Patient verbalized understanding of plan.

## 2011-11-14 ENCOUNTER — Encounter: Payer: Self-pay | Admitting: Internal Medicine

## 2011-11-14 ENCOUNTER — Ambulatory Visit (INDEPENDENT_AMBULATORY_CARE_PROVIDER_SITE_OTHER): Payer: Medicare Other | Admitting: Internal Medicine

## 2011-11-14 ENCOUNTER — Other Ambulatory Visit (INDEPENDENT_AMBULATORY_CARE_PROVIDER_SITE_OTHER): Payer: Medicare Other

## 2011-11-14 VITALS — BP 132/60 | HR 60 | Ht 63.0 in | Wt 152.8 lb

## 2011-11-14 DIAGNOSIS — K921 Melena: Secondary | ICD-10-CM

## 2011-11-14 DIAGNOSIS — K920 Hematemesis: Secondary | ICD-10-CM

## 2011-11-14 DIAGNOSIS — D649 Anemia, unspecified: Secondary | ICD-10-CM

## 2011-11-14 LAB — CBC WITH DIFFERENTIAL/PLATELET
Basophils Absolute: 0 10*3/uL (ref 0.0–0.1)
Basophils Relative: 0.6 % (ref 0.0–3.0)
Eosinophils Absolute: 0.2 10*3/uL (ref 0.0–0.7)
MCHC: 32.7 g/dL (ref 30.0–36.0)
MCV: 91.8 fl (ref 78.0–100.0)
Monocytes Absolute: 1.1 10*3/uL — ABNORMAL HIGH (ref 0.1–1.0)
Neutrophils Relative %: 55.6 % (ref 43.0–77.0)
RBC: 3.4 Mil/uL — ABNORMAL LOW (ref 3.87–5.11)
RDW: 16.7 % — ABNORMAL HIGH (ref 11.5–14.6)

## 2011-11-14 MED ORDER — MOVIPREP 100 G PO SOLR: ORAL | Status: DC

## 2011-11-14 NOTE — Patient Instructions (Addendum)
You have been scheduled for an endoscopy and colonoscopy with propofol. Please follow the written instructions given to you at your visit today. Please pick up your prep at the pharmacy within the next 1-3 days.  Use the coupon given to you today for this. If you use inhalers (even only as needed), please bring them with you on the day of your procedure.  Your physician has requested that you go to the basement for the following lab work before leaving today: CBC  Thank you for choosing me and [facility].  Iva Boop, M.D., [facility]

## 2011-11-14 NOTE — Progress Notes (Signed)
Subjective:    Patient ID: [name] [name], female    DOB: April 19, 1930, 78 y.o.   MRN: [id number]  HPI This is a very pleasant elderly white woman here with her sister because of recent bleeding problems. She was started on Xarelto for stroke prophylaxis in atrial fibrillation recently. Shortly after that she started having problems with spitting up small amounts of red blood. From the history I get it sounds like she was regurgitating and refluxing this. She would have intermittent problems, and then a couple of weeks ago or so she had a 5 minute episode where she did that. She also experienced a period of melena recently,. She had her aspirin and Xarelto stopped. The bleeding has stopped since then. She did have an immune fecal occult blood test positive recently. Hemoglobin has been 9-10. She had a total knee replacement in May and had a prolonged hospitalization with encephalopathy in atrial fibrillation at that time. Since then she has been on iron, and she feels like she is improving though she is still somewhat weak and tired.  She gives a history of chronic acid reflux and she said that when she was hospitalized and when she was in a nursing home recovering from her knee, she did experience a period of time without PPI. She has been back on her Nexium for the last month it seems. She denies any dysphagia. She does complain of gas and loose stools at times. All other GI review of systems are negative.  Allergies  Allergen Reactions  . Penicillins Itching  . Morphine Rash    And hot flashes   Outpatient Prescriptions Prior to Visit  Medication Sig Dispense Refill  . ALPRAZolam (XANAX) 0.5 MG tablet Take 0.5 mg by mouth 3 (three) times daily as needed. anxiety      . Cholecalciferol (VITAMIN D) 2000 UNITS CAPS Take 1 capsule by mouth daily.      [name] esomeprazole (NEXIUM) 40 MG capsule Take 40 mg by mouth daily.       . ferrous sulfate 325 (65 FE) MG tablet Take 325 mg by mouth daily with  breakfast.      . gabapentin (NEURONTIN) 300 MG capsule Take 300 mg by mouth 2 (two) times daily. & at bedtime      . HYDROcodone-acetaminophen (NORCO) 5-325 MG per tablet Take 1 tablet by mouth every 4 (four) hours as needed.      [name] levothyroxine (SYNTHROID, LEVOTHROID) 175 MCG tablet Take 175 mcg by mouth daily.       . Multiple Vitamin (MULTIVITAMIN) tablet Take 1 tablet by mouth daily.      [name] oxaprozin (DAYPRO) 600 MG tablet Take 600 mg by mouth daily.      . simvastatin (ZOCOR) 20 MG tablet Take 20 mg by mouth daily.       [name] tolterodine (DETROL LA) 4 MG 24 hr capsule Take 4 mg by mouth daily.       . vitamin C (ASCORBIC ACID) 500 MG tablet Take 500 mg by mouth daily.      . Zinc 50 MG TABS Take 1 tablet by mouth daily.      . metoprolol tartrate (LOPRESSOR) 12.5 mg TABS Take 0.5 tablets (12.5 mg total) by mouth 2 (two) times daily.  60 tablet  0  . Chromium Picolinate 1000 MCG TABS Take 1 tablet by mouth daily.      [name] docusate sodium (COLACE) 100 MG capsule Take 100 mg by mouth 2 (two) times daily.      [name]  Potassium Gluconate 595 MG CAPS Take 1 capsule by mouth daily.      . promethazine (PHENERGAN) 25 MG tablet Take 12.5 mg by mouth every 6 (six) hours as needed.             Past Medical History  Diagnosis Date  . Other and unspecified hyperlipidemia   . Carotid artery disease     50-69% stenosis R.ICA,12.11        4/13 carotid dopplar EPIC  . Hypothyroidism   . Anemia   . GERD (gastroesophageal reflux disease)   . Arthritis   . Atrial fibrillation 4/07    Remote h/o amiodarone use, recurred in 07/2011 post op from knee arthroplasty  . Shortness of breath 4/07    with  walking long distances since removal bronchogenic cyst- states no changes  . Unspecified essential hypertension   . Obese     moderately  . DJD (degenerative joint disease), thoracolumbar     with radiculopathy  . Asymptomatic cholelithiasis   . Hypokalemia     mild and supplemented  . Diverticulosis   .  Personal history of osteoarthritis     knees  . History of blood transfusion 08/31/2011    A+ type   Past Surgical History  Procedure Date  . Tonsillectomy   . Cholecystectomy 04/29/2005    Dr. Luretha Murphy  . Appendectomy   . Thoracotomy 4/07    removal bronchogenic cyst  . Total knee arthroplasty 08/10/2011    Procedure: TOTAL KNEE ARTHROPLASTY;  Surgeon: Loanne Drilling, MD;  Location: [facility];  Service: Orthopedics;  Laterality: Left;  . Rotator cuff repair     bilateral  . Tubal ligation     bilateral  . Ventral hernia repair 05/25/2005    incarcerated, lysis of adhesions, Dr. Luretha Murphy   History   Social History  . Marital Status: Widowed    Spouse Name: N/A    Number of Children: 2  . Years of Education:    Social History Main Topics  . Smoking status: Never Smoker   . Smokeless tobacco: Never Used  . Alcohol Use: No  . Drug Use: No  .     Other Topics Concern  .  retired from [employer], she lives alone, 2 sons, she has grandchildren and the area as well. No history of alcohol tobacco or caffeine or drug use.          Family History  Problem Relation Age of Onset  . Cancer    . Stroke    . Diabetes    . Hypertension        Review of Systems She is significantly recovered from her total knee and feels much better, she does use a cane and related. She is tired and somewhat weak but feels like she is improving. He wears eyeglasses. She has chronic stable dyspnea status post bronchogenic cyst resection years ago. She does have some arthritis pain. She has had thinning of hair. A dermatology appointment is pending after thyroid studies were normal. All other review of systems negative or as per history of present illness.    Objective:   Physical Exam General:  Well-developed, well-nourished and in no acute distress Eyes:  anicteric. ENT:   Mouth and posterior pharynx free of lesions.  Neck:   supple w/o thyromegaly or mass.  Lungs: Clear to auscultation  bilaterally. Heart:  S1S2, there is a soft 2/6 right upper sternal border systolic murmur, otherwise no rubs, other murmurs,  gallops. Abdomen:  soft, non-tender, no hepatosplenomegaly, hernia, or mass and BS+.  Rectal: Deferred until procedures Lymph:  no cervical or supraclavicular adenopathy. Extremities:   no edema Skin   no rash. Neuro:  A&O x 3.  Psych:  appropriate mood and  Affect.   Data Reviewed: Thyroid studies 11/04/2011. Hemoglobin 10.4 July 17. Hemoccult-positive stool, immune fecal occult blood positive 10/18/2011.  I reviewed may hospital admission and discharge summary. Lab Results  Component Value Date   WBC 16.3* 08/18/2011   HGB 10.5* 08/18/2011   HCT 32.4* 08/18/2011   MCV 89.3 08/18/2011   PLT 369 08/18/2011       Assessment & Plan:   1. Melena   2. Hematemesis   Problems 1 and 2 occurred while on Xarelto and should be evaluated with upper endoscopy and colonoscopy. She had an immune fecal occult blood test positive which means she has a colonic source of occult blood loss. Hematemesis most of them coming from the esophagus or stomach, if it truly was hematemesis. I think that is the case. The risks and benefits as well as alternatives of endoscopic procedure(s) have been discussed and reviewed. All questions answered. The patient agrees to proceed.  She seems reluctant to pursue anticoagulation a stroke prophylaxis in the future, this will need to be sorted out depending upon what we see on her endoscopic evaluation. I do not think she would need a small bowel investigation is to follow I know at this point   3. Anemia   Multifactorial status post total knee replacement in prolonged hospitalization and some GI blood loss. We'll recheck CBC today.     I appreciate the opportunity to care for this patient.   CC: Horald Pollen., PA Lewayne Bunting, MD

## 2011-11-15 ENCOUNTER — Encounter: Payer: Self-pay | Admitting: Cardiology

## 2011-11-15 ENCOUNTER — Ambulatory Visit (INDEPENDENT_AMBULATORY_CARE_PROVIDER_SITE_OTHER): Payer: Medicare Other | Admitting: Cardiology

## 2011-11-15 VITALS — BP 107/58 | HR 71 | Ht 65.0 in | Wt 152.0 lb

## 2011-11-15 DIAGNOSIS — I10 Essential (primary) hypertension: Secondary | ICD-10-CM

## 2011-11-15 DIAGNOSIS — I6529 Occlusion and stenosis of unspecified carotid artery: Secondary | ICD-10-CM

## 2011-11-15 DIAGNOSIS — I4891 Unspecified atrial fibrillation: Secondary | ICD-10-CM

## 2011-11-15 DIAGNOSIS — K920 Hematemesis: Secondary | ICD-10-CM

## 2011-11-15 DIAGNOSIS — E785 Hyperlipidemia, unspecified: Secondary | ICD-10-CM

## 2011-11-15 NOTE — Patient Instructions (Signed)
   NO Aspirin  NO Xarelto Your physician wants you to follow up in: 6 months.  You will receive a reminder letter in the mail one-two months in advance.  If you don't receive a letter, please call our office to schedule the follow up appointment

## 2011-11-16 ENCOUNTER — Encounter: Payer: Medicare Other | Admitting: Physical Therapy

## 2011-11-17 ENCOUNTER — Ambulatory Visit (AMBULATORY_SURGERY_CENTER): Payer: Medicare Other | Admitting: Internal Medicine

## 2011-11-17 ENCOUNTER — Encounter: Payer: Self-pay | Admitting: Internal Medicine

## 2011-11-17 VITALS — BP 128/72 | HR 75 | Temp 98.2°F | Resp 15 | Ht 63.0 in | Wt 152.0 lb

## 2011-11-17 DIAGNOSIS — K920 Hematemesis: Secondary | ICD-10-CM

## 2011-11-17 DIAGNOSIS — K2970 Gastritis, unspecified, without bleeding: Secondary | ICD-10-CM

## 2011-11-17 DIAGNOSIS — K921 Melena: Secondary | ICD-10-CM

## 2011-11-17 DIAGNOSIS — K2990 Gastroduodenitis, unspecified, without bleeding: Secondary | ICD-10-CM

## 2011-11-17 DIAGNOSIS — D126 Benign neoplasm of colon, unspecified: Secondary | ICD-10-CM

## 2011-11-17 DIAGNOSIS — R933 Abnormal findings on diagnostic imaging of other parts of digestive tract: Secondary | ICD-10-CM

## 2011-11-17 DIAGNOSIS — D649 Anemia, unspecified: Secondary | ICD-10-CM

## 2011-11-17 MED ORDER — SODIUM CHLORIDE 0.9 % IV SOLN: 500.0000 mL | INTRAVENOUS | Status: DC

## 2011-11-17 NOTE — Patient Instructions (Addendum)
Mild gastritis (irritation) was seen in the stomach. I do not think this is causing problems.  There was a small area of the colon that also looked irritated, though probably healing. You may have had colitis recently. Could have bled from that. I took biopsies and will let you know.   Thank you for choosing me and [facility].  Iva Boop, MD, FACG YOU HAD AN ENDOSCOPIC PROCEDURE TODAY AT THE [facility]: Refer to the procedure report that was given to you for any specific questions about what was found during the examination.  If the procedure report does not answer your questions, please call your gastroenterologist to clarify.  If you requested that your care partner not be given the details of your procedure findings, then the procedure report has been included in a sealed envelope for you to review at your convenience later.  YOU SHOULD EXPECT: Some feelings of bloating in the abdomen. Passage of more gas than usual.  Walking can help get rid of the air that was put into your GI tract during the procedure and reduce the bloating. If you had a lower endoscopy (such as a colonoscopy or flexible sigmoidoscopy) you may notice spotting of blood in your stool or on the toilet paper. If you underwent a bowel prep for your procedure, then you may not have a normal bowel movement for a few days.  DIET: Your first meal following the procedure should be a light meal and then it is ok to progress to your normal diet.  A half-sandwich or bowl of soup is an example of a good first meal.  Heavy or fried foods are harder to digest and may make you feel nauseous or bloated.  Likewise meals heavy in dairy and vegetables can cause extra gas to form and this can also increase the bloating.  Drink plenty of fluids but you should avoid alcoholic beverages for 24 hours.  ACTIVITY: Your care partner should take you home directly after the procedure.  You should plan to take it easy, moving  slowly for the rest of the day.  You can resume normal activity the day after the procedure however you should NOT DRIVE or use heavy machinery for 24 hours (because of the sedation medicines used during the test).    SYMPTOMS TO REPORT IMMEDIATELY: A gastroenterologist can be reached at any hour.  During normal business hours, 8:30 AM to 5:00 PM Monday through Friday, call [phone].  After hours and on weekends, please call the GI answering service at [phone] who will take a message and have the physician on call contact you.   Following lower endoscopy (colonoscopy or flexible sigmoidoscopy):  Excessive amounts of blood in the stool  Significant tenderness or worsening of abdominal pains  Swelling of the abdomen that is new, acute  Fever of 100F or higher  Following upper endoscopy (EGD)  Vomiting of blood or coffee ground material  New chest pain or pain under the shoulder blades  Painful or persistently difficult swallowing  New shortness of breath  Fever of 100F or higher  Black, tarry-looking stools  FOLLOW UP: If any biopsies were taken you will be contacted by phone or by letter within the next 1-3 weeks.  Call your gastroenterologist if you have not heard about the biopsies in 3 weeks.  Our staff will call the home number listed on your records the next business day following your procedure to check on you and address any questions  or concerns that you may have at that time regarding the information given to you following your procedure. This is a courtesy call and so if there is no answer at the home number and we have not heard from you through the emergency physician on call, we will assume that you have returned to your regular daily activities without incident.  SIGNATURES/CONFIDENTIALITY: You and/or your care partner have signed paperwork which will be entered into your electronic medical record.  These signatures attest to the fact that that the information  above on your After Visit Summary has been reviewed and is understood.  Full responsibility of the confidentiality of this discharge information lies with you and/or your care-partner.

## 2011-11-17 NOTE — Op Note (Signed)
[facility] [address] [place], [address]   ENDOSCOPY PROCEDURE REPORT  PATIENT: [name], [name]  MR#: [id number] BIRTHDATE: 30-Dec-1930 , 81  yrs. old GENDER: Female ENDOSCOPIST: Iva Boop, MD, [facility] REFERRED BY:  Helene Kelp, PA PROCEDURE DATE:  11/17/2011 PROCEDURE:  EGD, diagnostic ASA CLASS:     Class III INDICATIONS:  hematemesis.   melena. MEDICATIONS: propofol (Diprivan) 100mg  IV, MAC sedation, administered by CRNA, and These medications were titrated to patient response per physician's verbal order TOPICAL ANESTHETIC: Cetacaine Spray  DESCRIPTION OF PROCEDURE: After the risks benefits and alternatives of the procedure were thoroughly explained, informed consent was obtained.  The LB [id number] [id number] endoscope was introduced through the mouth and advanced to the second portion of the duodenum. Without limitations.  The instrument was slowly withdrawn as the mucosa was fully examined.      STOMACH: Mild non-erosive gastritis (inflammation) was found in the gastric antrum.  The remainder of the upper endoscopy exam was otherwise normal. Retroflexed views revealed no abnormalities.     The scope was then withdrawn from the patient and the procedure completed.  COMPLICATIONS: There were no complications. ENDOSCOPIC IMPRESSION: 1.   Non-erosive gastritis (inflammation) was found in the gastric antrum 2.   The remainder of the upper endoscopy exam was otherwise normal  RECOMMENDATIONS: Proceed with a Colonoscopy.   eSigned:  Iva Boop, MD, [facility] 11/17/2011 2:17 PM   XB:JYNWGN Caryn Bee, PA, Rollene Rotunda, MD, and The Patient

## 2011-11-17 NOTE — Op Note (Signed)
[facility] [address] [place], [address]   COLONOSCOPY PROCEDURE REPORT  PATIENT: [name] [name], [name] [name]  MR#: [id number] BIRTHDATE: November 29, 1930 , 81  yrs. old GENDER: Female ENDOSCOPIST: Iva Boop, MD, [facility] REFERRED BY: PROCEDURE DATE:  11/17/2011 PROCEDURE:   Colonoscopy with biopsy ASA CLASS:   Class III INDICATIONS:melena with recent Xarelto use MEDICATIONS: propofol (Diprivan) 100mg  IV, MAC sedation, administered by CRNA, These medications were titrated to patient response per physician's verbal order, and There was residual sedation effect present from prior procedure.  DESCRIPTION OF PROCEDURE:   After the risks benefits and alternatives of the procedure were thoroughly explained, informed consent was obtained.  A digital rectal exam revealed no abnormalities of the rectum.   The LB [id number]  endoscope was introduced through the anus and advanced to the cecum, which was identified by both the appendix and ileocecal valve. No adverse events experienced.   The quality of the prep was excellent, using MoviPrep  The instrument was then slowly withdrawn as the colon was fully examined.      COLON FINDINGS: Abnormal mucosa was found in the proximal descending colon.  The mucosa was erythematous.  Multiple biopsies were performed using cold forceps.   The colon mucosa was otherwise normal.  Retroflexed views revealed no abnormalities. The time to cecum=5 minutes 22 seconds  Withdrawal time=6 minutes 50 seconds. The scope was withdrawn and the procedure completed. COMPLICATIONS: There were no complications. ENDOSCOPIC IMPRESSION: 1.   Abnormal mucosa was found in the proximal descending colon; multiple biopsies were performed using cold forceps 2.   The colon mucosa was otherwise normal  RECOMMENDATIONS: 1.  Await biopsy results 2.  Continue current medications 3.  Office will call with the results.  eSigned:  Iva Boop, MD, [facility]  11/17/2011 2:22 PM   cc: Helene Kelp, PA, Rollene Rotunda, MD, and The Patient

## 2011-11-17 NOTE — Progress Notes (Addendum)
Patient did not have preoperative order for IV antibiotic SSI prophylaxis. (G8918)  Patient did not experience any of the following events: a burn prior to discharge; a fall within the facility; wrong site/side/patient/procedure/implant event; or a hospital transfer or hospital admission upon discharge from the facility. (G8907)  

## 2011-11-18 ENCOUNTER — Ambulatory Visit: Payer: Medicare Other | Admitting: Internal Medicine

## 2011-11-18 ENCOUNTER — Encounter: Payer: Medicare Other | Admitting: *Deleted

## 2011-11-18 ENCOUNTER — Telehealth: Payer: Self-pay | Admitting: *Deleted

## 2011-11-18 NOTE — Telephone Encounter (Signed)
  Follow up Call-  Call back number 11/17/2011  Post procedure Call Back phone  # [phone]  Permission to leave phone message Yes     Patient questions:  Do you have a fever, pain , or abdominal swelling? no       Patient states swollen upper lip when she got home.   Still there today. Not painful..just annoying. Suggested ice pack. Could have been from bite block; no open areas; no bruising noted; no SOB. Pain Score  1 *  Have you tolerated food without any problems? yes  Have you been able to return to your normal activities? yes  Do you have any questions about your discharge instructions: Diet   no Medications  no Follow up visit  no  Do you have questions or concerns about your Care? yes  Actions: * If pain score is 4 or above: No action needed, pain <4.  Patient told to call us if the lip swelling did not get better.

## 2011-11-23 NOTE — Progress Notes (Signed)
Quick Note:  My mistake on diarrhea - had ? Of inflamed colon but not  No further GI work-up - to follow-up with Helene Kelp PA and cardiology, Dr. Antoine Poche. ______

## 2011-11-23 NOTE — Progress Notes (Signed)
Quick Note:  Office -   Colon biopsies are ok Update on diarrhea please  [facility] - no letter or recall ______

## 2011-12-02 ENCOUNTER — Telehealth: Payer: Self-pay | Admitting: *Deleted

## 2011-12-02 NOTE — Telephone Encounter (Signed)
Error

## 2011-12-04 DIAGNOSIS — K920 Hematemesis: Secondary | ICD-10-CM | POA: Insufficient documentation

## 2011-12-04 NOTE — Assessment & Plan Note (Signed)
followup the patient's primary care physician given her history of carotid artery disease should be continued to be monitored.

## 2011-12-04 NOTE — Assessment & Plan Note (Signed)
Blood pressure is well controlled 

## 2011-12-04 NOTE — Assessment & Plan Note (Signed)
Patient had massive hematemesis including black stools from swallowing blood after she was started on Xarelto by Dr. Antoine Poche approximately 2-3 days after initiation.  She has been scheduled for endoscopy upper and lower.  As a matter of fact at the time of this dictation and procedure preliminary results and the patient only had mild gastritis.  She was continued on full dose aspirin in conjunction with Xarelto prior to her hematemesis.  Her risk is calculated using theCHADSVasc which grossly overestimated her risk for future stroke.  According to the CHADS2 score which has better C statistics and high positive likelihood ratio the patient's risk without treatment is 2.50% per 100 person year and all treatment 1.27% on a patient here almost negligible risk.  Certainly clinically of very little significance particularly now in light of her massive hematemesis.  Her score is calculated without taking into account her bleeding score risk.  The patient declines any further form of anticoagulation in the future and a full support as her in his decision.

## 2011-12-04 NOTE — Progress Notes (Signed)
Peyton Bottoms, MD, [facility] Board Certified in Adult Cardiovascular Medicine,Internal Medicine and Critical Care Medicine    CC: followup after recent massive hematemesis following initiation of Xarelto by Dr. Antoine Poche                                                                              HPI:        The patient presents to the clinic very upset.  She has had massive hematemesis after starting Xarelto approximately 3 days afterwards.  She felt that the side effects were not properly explained to her.  In the meanwhile she already had an endoscopy done which showed no malignant lesion and the patient only had mild gastritis.  Both upper and lower endoscopy were otherwise nonsignificant. The patient's risk of stroke is grossly overestimated using the CHADSvasc process inferior C statistics with a positive LR of 1.05 essentially a flip of the coin. Explained to the patient and she stated that she never wants to be started every again and the type of anticoagulation.  Also aspirin was continued previously with Xarelto. The patient states that she feels fine currently she's had no more hematemesis.  She has no chest pain.  Has never been a prior history of stroke.I also explained to the patient that we do not know much about the pharmacology and dosing of his drug in elderly patients.  I explained to her and in my opinion one dose does not fit all.  Particularly in elderly patient group.  PMH: reviewed and listed in Problem List in Electronic Records (and see below) Past Medical History  Diagnosis Date  . Other and unspecified hyperlipidemia   . Carotid artery disease     50-69% stenosis R.ICA,12.11        4/13 carotid dopplar EPIC  . Hypothyroidism   . Anemia   . GERD (gastroesophageal reflux disease)   . Arthritis   . Atrial fibrillation 4/07    Remote h/o amiodarone use, recurred in 07/2011 post op from knee arthroplasty  . Shortness of breath 4/07    with  walking long distances  since removal bronchogenic cyst- states no changes  . Unspecified essential hypertension   . Obese     moderately  . DJD (degenerative joint disease), thoracolumbar     with radiculopathy  . Asymptomatic cholelithiasis   . Hypokalemia     mild and supplemented  . Diverticulosis   . Personal history of osteoarthritis     knees  . History of blood transfusion 08/31/2011    A+ type   Past Surgical History  Procedure Date  . Tonsillectomy   . Cholecystectomy 04/29/2005    Dr. Luretha Murphy  . Appendectomy   . Thoracotomy 4/07    removal bronchogenic cyst  . Total knee arthroplasty 08/10/2011    Procedure: TOTAL KNEE ARTHROPLASTY;  Surgeon: Loanne Drilling, MD;  Location: [facility];  Service: Orthopedics;  Laterality: Left;  . Rotator cuff repair     bilateral  . Tubal ligation     bilateral  . Ventral hernia repair 05/25/2005    incarcerated, lysis of adhesions, Dr. Luretha Murphy    Allergies/SH/FHX : available in Electronic Records  for review  Allergies  Allergen Reactions  . Penicillins Itching  . Morphine Rash    And hot flashes  . Xarelto (Rivaroxaban) Other (See Comments)    Bleeding,spitting up blood   History   Social History  . Marital Status: Widowed    Spouse Name: N/A    Number of Children: N/A  . Years of Education: N/A   Occupational History  . Not on file.   Social History Main Topics  . Smoking status: Never Smoker   . Smokeless tobacco: Never Used  . Alcohol Use: No  . Drug Use: No  . Sexually Active: Not on file   Other Topics Concern  . Not on file   Social History Narrative  . No narrative on file   Family History  Problem Relation Age of Onset  . Cancer    . Stroke    . Diabetes    . Hypertension    . Colon cancer Neg Hx   . Colon polyps Neg Hx   . Rectal cancer Neg Hx   . Stomach cancer Neg Hx     Medications: Current Outpatient Prescriptions  Medication Sig Dispense Refill  . ALPRAZolam (XANAX) 0.5 MG tablet Take 0.5 mg by  mouth. Take 1 in morning and 2 every evening for anxiety      . Cholecalciferol (VITAMIN D) 2000 UNITS CAPS Take 1 capsule by mouth daily.      [name] esomeprazole (NEXIUM) 40 MG capsule Take 40 mg by mouth daily.       . ferrous sulfate 325 (65 FE) MG tablet Take 325 mg by mouth 2 (two) times daily.       [name] gabapentin (NEURONTIN) 300 MG capsule Take 300 mg by mouth 2 (two) times daily.       [name] HYDROcodone-acetaminophen (LORTAB) 7.5-500 MG per tablet Take 1 tablet by mouth every 8 (eight) hours as needed.      [name] levothyroxine (SYNTHROID, LEVOTHROID) 175 MCG tablet Take 175 mcg by mouth daily.       . metoprolol tartrate (LOPRESSOR) 12.5 mg TABS Take 25 mg by mouth daily.       . Multiple Vitamin (MULTIVITAMIN) tablet Take 1 tablet by mouth daily.      [name] oxaprozin (DAYPRO) 600 MG tablet Take 600 mg by mouth 2 (two) times daily.       . simvastatin (ZOCOR) 20 MG tablet Take 20 mg by mouth daily.       [name] tolterodine (DETROL LA) 4 MG 24 hr capsule Take 4 mg by mouth daily.       . vitamin C (ASCORBIC ACID) 500 MG tablet Take 1,000 mg by mouth daily.       . Zinc 50 MG TABS Take 1 tablet by mouth daily.        ROS: No nausea or vomiting. No fever or chills.No melena or hematochezia.No bleeding.No claudication  Physical Exam: BP 107/58  Pulse 71  Ht 5\' 5"  (1.651 m)  Wt 152 lb (68.947 kg)  BMI 25.29 kg/m2 General:well-nourished in no apparent distress Neck:normal carotid upstroke and no carotid bruit on the left side soft carotid bruit on the right side Lungs:good breath sounds bilaterally with no wheezing. Cardiac:regular rate and rhythm with normal S1-S2 no murmur rubs or gallops Vascular:no edema.  Normal distal pulses Skin:warm and dry Physcologic:normal affect  12lead ECG:not obtained Limited bedside ECHO:N/A No images are attached to the encounter.   I reviewed and summarized the old records. I reviewed  ECG and prior blood work.  Assessment and Plan  Atrial fibrillation with  RVR Heart rate was controlled.  Patient remains in normal sinus rhythm.  Of note is that the patient also had a defibrillation in the setting of stress after left knee  Cellulitis.otherwise the patient has no palpitations.  CAROTID ARTERY DISEASE Carotid artery disease with only moderate disease on the left and moderate to severe disease on the right.  Followup carotid Dopplers of the scheduled by vascular surgery.  Hematemesis/vomiting blood Patient had massive hematemesis including black stools from swallowing blood after she was started on Xarelto by Dr. Antoine Poche approximately 2-3 days after initiation.  She has been scheduled for endoscopy upper and lower.  As a matter of fact at the time of this dictation and procedure preliminary results and the patient only had mild gastritis.  She was continued on full dose aspirin in conjunction with Xarelto prior to her hematemesis.  Her risk is calculated using theCHADSVasc which grossly overestimated her risk for future stroke.  According to the CHADS2 score which has better C statistics and high positive likelihood ratio the patient's risk without treatment is 2.50% per 100 person year and all treatment 1.27% on a patient here almost negligible risk.  Certainly clinically of very little significance particularly now in light of her massive hematemesis.  Her score is calculated without taking into account her bleeding score risk.  The patient declines any further form of anticoagulation in the future and a full support as her in his decision.  HYPERTENSION, UNSPECIFIED Blood pressure is well controlled.  HYPERLIPIDEMIA-MIXED followup the patient's primary care physician given her history of carotid artery disease should be continued to be monitored.    Patient Active Problem List  Diagnosis  . HYPERLIPIDEMIA-MIXED  . HYPERTENSION, UNSPECIFIED  . DYSHIDROTIC ECZEMA  . SKIN RASH  . CAROTID BRUIT, RIGHT  . UNSPEC CERBRL ART OCCLUSION W/O MENTION  INFARCT  . CAROTID ARTERY DISEASE  . OA (osteoarthritis) of knee  . Postop Acute blood loss anemia  . Atrial fibrillation with RVR  . Abnormal chest CT  . Hematemesis/vomiting blood

## 2011-12-04 NOTE — Assessment & Plan Note (Signed)
Carotid artery disease with only moderate disease on the left and moderate to severe disease on the right.  Followup carotid Dopplers of the scheduled by vascular surgery.

## 2011-12-04 NOTE — Assessment & Plan Note (Addendum)
Heart rate was controlled.  Patient remains in normal sinus rhythm.  Of note is that the patient also had a defibrillation in the setting of stress after left knee  Cellulitis.otherwise the patient has no palpitations.

## 2011-12-23 ENCOUNTER — Ambulatory Visit: Payer: Medicare Other | Admitting: Cardiology

## 2012-02-03 ENCOUNTER — Other Ambulatory Visit: Payer: Self-pay | Admitting: Cardiology

## 2012-02-03 DIAGNOSIS — I6529 Occlusion and stenosis of unspecified carotid artery: Secondary | ICD-10-CM

## 2012-02-08 ENCOUNTER — Encounter (INDEPENDENT_AMBULATORY_CARE_PROVIDER_SITE_OTHER): Payer: Medicare Other

## 2012-02-08 DIAGNOSIS — I6529 Occlusion and stenosis of unspecified carotid artery: Secondary | ICD-10-CM

## 2012-02-21 ENCOUNTER — Telehealth: Payer: Self-pay | Admitting: *Deleted

## 2012-02-21 NOTE — Telephone Encounter (Signed)
Patient informed via message machine. 

## 2012-02-21 NOTE — Telephone Encounter (Signed)
Message copied by Eustace Moore on Tue Feb 21, 2012 10:24 AM ------      Message from: FLEMING, PAMELA J      Created: Mon Feb 20, 2012  3:25 PM                   ----- Message -----         From: Rollene Rotunda, MD         Sent: 02/16/2012   7:47 PM           To: Rocco Serene, RN            Follow up as suggested.

## 2012-06-25 ENCOUNTER — Telehealth: Payer: Self-pay | Admitting: Physician Assistant

## 2012-06-25 NOTE — Telephone Encounter (Signed)
Spoke with patient and she would like appt for refills and labwork.  Appt scheduled for tomorrow morning at 9:00.

## 2012-06-26 ENCOUNTER — Encounter: Payer: Self-pay | Admitting: Physician Assistant

## 2012-06-26 ENCOUNTER — Ambulatory Visit (INDEPENDENT_AMBULATORY_CARE_PROVIDER_SITE_OTHER): Payer: Medicare Other | Admitting: Physician Assistant

## 2012-06-26 VITALS — BP 144/76 | HR 59 | Temp 97.6°F | Ht 65.0 in | Wt 169.8 lb

## 2012-06-26 DIAGNOSIS — E785 Hyperlipidemia, unspecified: Secondary | ICD-10-CM

## 2012-06-26 DIAGNOSIS — K219 Gastro-esophageal reflux disease without esophagitis: Secondary | ICD-10-CM

## 2012-06-26 DIAGNOSIS — M1710 Unilateral primary osteoarthritis, unspecified knee: Secondary | ICD-10-CM

## 2012-06-26 DIAGNOSIS — F411 Generalized anxiety disorder: Secondary | ICD-10-CM

## 2012-06-26 DIAGNOSIS — D649 Anemia, unspecified: Secondary | ICD-10-CM

## 2012-06-26 DIAGNOSIS — R32 Unspecified urinary incontinence: Secondary | ICD-10-CM

## 2012-06-26 LAB — COMPREHENSIVE METABOLIC PANEL
Albumin: 3.7 g/dL (ref 3.5–5.2)
Alkaline Phosphatase: 55 U/L (ref 39–117)
BUN: 16 mg/dL (ref 6–23)
CO2: 27 mEq/L (ref 19–32)
Glucose, Bld: 71 mg/dL (ref 70–99)
Total Bilirubin: 0.4 mg/dL (ref 0.3–1.2)

## 2012-06-26 MED ORDER — GABAPENTIN 300 MG PO CAPS: 300.0000 mg | ORAL_CAPSULE | Freq: Two times a day (BID) | ORAL | Status: DC

## 2012-06-26 MED ORDER — ESOMEPRAZOLE MAGNESIUM 40 MG PO CPDR: 40.0000 mg | DELAYED_RELEASE_CAPSULE | Freq: Every day | ORAL | Status: DC

## 2012-06-26 MED ORDER — ALPRAZOLAM 0.5 MG PO TABS: 0.5000 mg | ORAL_TABLET | Freq: Three times a day (TID) | ORAL | Status: DC | PRN

## 2012-06-26 MED ORDER — TOLTERODINE TARTRATE ER 4 MG PO CP24: 4.0000 mg | ORAL_CAPSULE | Freq: Every day | ORAL | Status: DC

## 2012-06-26 MED ORDER — SIMVASTATIN 20 MG PO TABS: 20.0000 mg | ORAL_TABLET | Freq: Every day | ORAL | Status: DC

## 2012-06-26 NOTE — Progress Notes (Signed)
  Subjective:    Patient ID: [name] [name], female    DOB: 1930-11-10, 79 y.o.   MRN: [id number]  HPI meds to be written for mail order    Review of Systems  All other systems reviewed and are negative.       Objective:   Physical Exam  Vitals reviewed. Constitutional: She is oriented to person, place, and time. She appears well-developed and well-nourished.  HENT:  Head: Normocephalic and atraumatic.  Right Ear: External ear normal.  Left Ear: External ear normal.  Mouth/Throat: Oropharynx is clear and moist.  Dried cerumen in both ear canals, partially occluding the ear canals Nasal hypertrophy  Eyes: Conjunctivae and EOM are normal.  Neck: Normal range of motion. Neck supple. No JVD present. No tracheal deviation present. No thyromegaly present.  Cardiovascular: Normal rate, regular rhythm and normal heart sounds.   Pulmonary/Chest: Effort normal and breath sounds normal.  Abdominal: Soft. Bowel sounds are normal.  Musculoskeletal:  Lumbar muscle tightness  Lymphadenopathy:    She has no cervical adenopathy.  Neurological: She is alert and oriented to person, place, and time.  Skin: Skin is warm and dry.  Psychiatric: She has a normal mood and affect. Her behavior is normal. Judgment and thought content normal.          Assessment & Plan:  Hyperlipidemia - Plan: Comprehensive metabolic panel, NMR Lipoprofile with Lipids, simvastatin (ZOCOR) 20 MG tablet  Anemia - Plan: CBC with Differential, CANCELED: POCT CBC  Anxiety state, unspecified - Plan: ALPRAZolam (XANAX) 0.5 MG tablet  Osteoarthrosis, unspecified whether generalized or localized, involving lower leg - Plan: gabapentin (NEURONTIN) 300 MG capsule, CANCELED: POCT CBC  GERD (gastroesophageal reflux disease) - Plan: esomeprazole (NEXIUM) 40 MG capsule  Urinary incontinence - Plan: tolterodine (DETROL LA) 4 MG 24 hr capsule

## 2012-06-27 ENCOUNTER — Telehealth: Payer: Self-pay | Admitting: Physician Assistant

## 2012-06-27 LAB — NMR LIPOPROFILE WITH LIPIDS
Cholesterol, Total: 114 mg/dL (ref <200)
HDL Particle Number: 25 umol/L — ABNORMAL LOW (ref >=30.5)
HDL-C: 34 mg/dL — ABNORMAL LOW (ref >=40)
LDL (calc): 60 mg/dL (ref <100)
LDL Particle Number: 898 nmol/L (ref <1000)
LDL Size: 20.7 nm (ref >20.5)
LP-IR Score: 62 — ABNORMAL HIGH (ref <=45)
VLDL Size: 43.7 nm (ref >=46.6)

## 2012-06-27 LAB — CBC WITH DIFFERENTIAL/PLATELET
Hemoglobin: 12.2 g/dL (ref 12.0–15.0)
Lymphocytes Relative: 29 % (ref 12–46)
Lymphs Abs: 2.2 10*3/uL (ref 0.7–4.0)
MCH: 29.4 pg (ref 26.0–34.0)
Monocytes Relative: 15 % — ABNORMAL HIGH (ref 3–12)
Neutro Abs: 4 10*3/uL (ref 1.7–7.7)
Neutrophils Relative %: 52 % (ref 43–77)
Platelets: 220 10*3/uL (ref 150–400)
RBC: 4.15 MIL/uL (ref 3.87–5.11)
WBC: 7.6 10*3/uL (ref 4.0–10.5)

## 2012-06-27 NOTE — Telephone Encounter (Signed)
Please advise 

## 2012-06-27 NOTE — Telephone Encounter (Signed)
Forward that to [place] for tomorrow

## 2012-06-27 NOTE — Progress Notes (Signed)
Quick Note:  Labs were within normal limits ______ 

## 2012-06-27 NOTE — Telephone Encounter (Signed)
Please advise. Forwarded per Dr Modesto Charon

## 2012-06-29 ENCOUNTER — Encounter: Payer: Self-pay | Admitting: *Deleted

## 2012-06-29 NOTE — Progress Notes (Signed)
Quick Note:  Labs were within normal limits ______ 

## 2012-07-24 ENCOUNTER — Ambulatory Visit: Payer: Medicare Other | Admitting: Cardiology

## 2012-07-26 ENCOUNTER — Ambulatory Visit: Payer: Medicare Other | Admitting: Cardiology

## 2012-08-30 ENCOUNTER — Encounter: Payer: Self-pay | Admitting: Nurse Practitioner

## 2012-08-30 ENCOUNTER — Ambulatory Visit (INDEPENDENT_AMBULATORY_CARE_PROVIDER_SITE_OTHER): Payer: Medicare Other

## 2012-08-30 ENCOUNTER — Ambulatory Visit (INDEPENDENT_AMBULATORY_CARE_PROVIDER_SITE_OTHER): Payer: Medicare Other | Admitting: Nurse Practitioner

## 2012-08-30 VITALS — BP 136/82 | HR 63 | Temp 97.8°F | Ht 63.0 in | Wt 170.5 lb

## 2012-08-30 DIAGNOSIS — Z01818 Encounter for other preprocedural examination: Secondary | ICD-10-CM

## 2012-08-30 DIAGNOSIS — I10 Essential (primary) hypertension: Secondary | ICD-10-CM

## 2012-08-30 NOTE — Patient Instructions (Signed)
Knee Rehabilitation, Guidelines Following Surgery °Results after knee surgery are often greatly improved when you follow the exercise, range of motion and muscle strengthening exercises prescribed by your doctor. Safety measures are also important to protect the knee from further injury. Any time any of these exercises cause you to have increased pain or swelling in your knee joint, decrease the amount until you are comfortable again and slowly increase them. If you have problems or questions, call your caregiver or physical therapist for advice. °HOME CARE INSTRUCTIONS  °· Remove items at home which could result in a fall. This includes throw rugs or furniture in walking pathways. °· Continue medications as instructed. °· You may shower or take tub baths when your staples or stitches are removed or as instructed. °· Walk using crutches or walker as instructed. °· Put weight on your legs and walk as much as is comfortable. °· You may resume a sexual relationship in one month or when given the OK by your doctor. °· Return to work as instructed by your doctor. °· Do not drive a car for 6 weeks or as instructed. °· Wear elastic stockings until instructed not to. °· Make sure you keep all of your appointments after your operation with all of your doctors and caregivers. °RANGE OF MOTION AND STRENGTHENING EXERCISES °Rehabilitation of the knee is important following a knee injury or an operation. After just a few days of immobilization, the muscles of the thigh which control the knee become weakened and shrink (atrophy). Knee exercises are designed to build up the tone and strength of the thigh muscles and to improve knee motion. Often times heat used for twenty to thirty minutes before working out will loosen up your tissues and help with improving the range of motion. These exercises can be done on a training (exercise) mat, on the floor, on a table or on a bed. Use what ever works the best and is most comfortable for  you Knee exercises include: °· Leg Lifts - While your knee is still immobilized in a splint or cast, you can do straight leg raises. Lift the leg to 60 degrees, hold for 3 sec, and slowly lower the leg. Repeat 10-20 times 2-3 times daily. Perform this exercise against resistance later as your knee gets better. °· Quad and Hamstring Sets - Tighten up the muscle on the front of the thigh (Quad) and hold for 5-10 sec. Repeat this 10-20 times hourly. Hamstring sets are done by pushing the foot backward against an object and holding for 5-10 sec. Repeat as with quad sets. °· Resistance and Weight exercises - After your knee no longer needs to be immobilized, progressive motion, resistance, and weight lifting exercises should be performed. This is best done under the guidance of a physical therapist. You may safely start with wall squats; with your feet 10 inches from a wall, place your back flat against the wall and lower your trunk about 6 inches until you feel work in your thighs. Hold 20-40 seconds, then rise slowly. Do 3-6 repetitions 2-3 times daily. °· Endurance Training - Bicycle and walking are helpful in restoring strength and endurance to the leg. °A rehabilitation program following serious knee injuries can speed recovery and prevent re-injury in the future due to weakened muscles. Contact your doctor or a physical therapist for more information on knee rehabilitation. °MAKE SURE YOU:  °· Understand these instructions. °· Will watch your condition. °· Will get help right away if you are not doing   well or get worse. °Document Released: 03/14/2005 Document Revised: 06/06/2011 Document Reviewed: 09/01/2006 °[employer]® Patient Information ©2014 [employer]. ° °

## 2012-08-30 NOTE — Progress Notes (Signed)
  Subjective:    Patient ID: [name] [name], female    DOB: [birth year], 79 y.o.   MRN: [id number]  HPI Pateint here today for surgical clearance- she is to have a total knee replacement July 14,2014- She is doing well- HAs history of atrial fib after her last surgery and sees Dr. Degent/Dr. Diona Browner.    Review of Systems  Constitutional: Negative.   HENT: Negative.   Respiratory: Negative for chest tightness and shortness of breath.   Cardiovascular: Negative for chest pain, palpitations and leg swelling.  Genitourinary: Negative.        Objective:   Physical Exam  Constitutional: She is oriented to person, place, and time. She appears well-developed and well-nourished.  Cardiovascular: Normal rate, normal heart sounds and intact distal pulses.   Pulmonary/Chest: Effort normal and breath sounds normal.  Abdominal: Soft. Bowel sounds are normal.  Neurological: She is alert and oriented to person, place, and time.  Psychiatric: She has a normal mood and affect. Her behavior is normal. Judgment and thought content normal.    BP 136/82  Pulse 63  Temp(Src) 97.8 F (36.6 C) (Oral)  Ht 5\' 3"  (1.6 m)  Wt 170 lb 8 oz (77.338 kg)  BMI 30.21 kg/m2       Assessment & Plan:   1. Hypertension   2. Preoperative clearance    medically cleared for surgery if labs are good- Labs pending Patient to see cardiologist before surgery to be cleared  [name] Daphine Deutscher, FNP

## 2012-09-20 NOTE — Progress Notes (Signed)
Surgery scheduled for 10/08/12.  Need orders in EPIC.  Thank You.  

## 2012-09-24 NOTE — Progress Notes (Signed)
Need orders when able please - Pt coming for preop TUES 10/02/12 - Thank you

## 2012-09-27 ENCOUNTER — Encounter: Payer: Self-pay | Admitting: Cardiology

## 2012-09-27 ENCOUNTER — Encounter (HOSPITAL_COMMUNITY): Payer: Self-pay | Admitting: Pharmacy Technician

## 2012-09-27 ENCOUNTER — Ambulatory Visit (INDEPENDENT_AMBULATORY_CARE_PROVIDER_SITE_OTHER): Payer: Medicare Other | Admitting: Cardiology

## 2012-09-27 VITALS — BP 143/79 | HR 67 | Ht 65.0 in | Wt 155.0 lb

## 2012-09-27 DIAGNOSIS — I10 Essential (primary) hypertension: Secondary | ICD-10-CM

## 2012-09-27 DIAGNOSIS — I6523 Occlusion and stenosis of bilateral carotid arteries: Secondary | ICD-10-CM

## 2012-09-27 DIAGNOSIS — I4891 Unspecified atrial fibrillation: Secondary | ICD-10-CM

## 2012-09-27 DIAGNOSIS — I6529 Occlusion and stenosis of unspecified carotid artery: Secondary | ICD-10-CM

## 2012-09-27 DIAGNOSIS — E785 Hyperlipidemia, unspecified: Secondary | ICD-10-CM

## 2012-09-27 NOTE — Patient Instructions (Addendum)
Your physician recommends that you schedule a follow-up appointment in: 4 months. Your physician recommends that you continue on your current medications as directed. Please refer to the Current Medication list given to you today. Your physician has requested that you have a carotid duplex in November 2014. This test is an ultrasound of the carotid arteries in your neck. It looks at blood flow through these arteries that supply the brain with blood. Allow one hour for this exam. There are no restrictions or special instructions.

## 2012-09-27 NOTE — Assessment & Plan Note (Signed)
No change to current regimen. 

## 2012-09-27 NOTE — Assessment & Plan Note (Signed)
Will obtain carotid Dopplers in November with office followup.

## 2012-09-27 NOTE — Progress Notes (Signed)
Clinical Summary Ms. Randol is a 79 y.o.female presenting for office visit. She is a former patient of Dr. Andee Lineman, last seen in September 2013. I reviewed his previous note and discussion regarding plan not to pursue long-term anticoagulation in this patient. She has a history of postoperative atrial fibrillation last year, has maintained sinus rhythm on followup however.  She will be undergoing right knee replacement with Dr. Lequita Halt in mid July. From a functional perspective she reports no limiting shortness of breath, no palpitations or chest pain. She describes herself as being very active, works outdoors, takes care of her yard. ECG today shows normal sinus rhythm at 65, nonspecific T-wave changes.  Carotid Dopplers from November 2013 revealed 60-79% RICA stenosis and 40-59% LICA stenosis. Echocardiogram from May 2013 was overall difficult study with limited images, possibly mildly decreased LVEF, mild LVH, mild mitral regurgitation, PASP 38 mm mercury.  She reports no focal motor weakness, no speech deficits, no memory problems. She has continued on aspirin, and at this point is not on any rate lowering medications.  Allergies  Allergen Reactions  . Omeprazole Nausea And Vomiting  . Penicillins Itching  . Morphine Rash    And hot flashes  . Xarelto (Rivaroxaban) Other (See Comments)    Bleeding,spitting up blood    Current Outpatient Prescriptions  Medication Sig Dispense Refill  . ALPRAZolam (XANAX) 0.5 MG tablet Take 0.5-1 mg by mouth 3 (three) times daily as needed for sleep or anxiety. Take 1 in morning and 2 every evening for anxiety      . aspirin EC 81 MG tablet Take 81 mg by mouth every morning.      . Cholecalciferol (VITAMIN D) 2000 UNITS CAPS Take 2,000 Units by mouth daily.       [name] esomeprazole (NEXIUM) 40 MG capsule Take 40 mg by mouth daily.      . ferrous sulfate 325 (65 FE) MG tablet Take 325 mg by mouth 2 (two) times daily.       . fish oil-omega-3 fatty acids 1000  MG capsule Take 1 g by mouth daily.      [name] gabapentin (NEURONTIN) 300 MG capsule Take 300 mg by mouth 2 (two) times daily.      [name] GARLIC PO Take 1 tablet by mouth daily.      [name] HYDROcodone-acetaminophen (LORTAB) 7.5-500 MG per tablet Take 1 tablet by mouth every 8 (eight) hours as needed for pain.       [name] levothyroxine (SYNTHROID, LEVOTHROID) 137 MCG tablet Take 137 mcg by mouth daily before breakfast.      . LUTEIN-ZEAXANTHIN PO Take 1 tablet by mouth daily.      . Multiple Vitamin (MULTIVITAMIN WITH MINERALS) TABS Take 1 tablet by mouth daily.      [name] oxaprozin (DAYPRO) 600 MG tablet Take 600 mg by mouth 2 (two) times daily.       [name] PREMARIN vaginal cream Place 0.625 Applicatorfuls vaginally daily.      . simvastatin (ZOCOR) 20 MG tablet Take 20 mg by mouth every morning.      . tolterodine (DETROL LA) 4 MG 24 hr capsule Take 4 mg by mouth daily.      . vitamin C (ASCORBIC ACID) 500 MG tablet Take 500 mg by mouth daily.       . Zinc 50 MG TABS Take 50 mg by mouth daily.       . [DISCONTINUED] tolterodine (DETROL LA) 4 MG 24 hr capsule Take 4 mg  by mouth daily.        No current facility-administered medications for this visit.    Past Medical History  Diagnosis Date  . Mixed hyperlipidemia   . Carotid artery disease   . Hypothyroidism   . GERD (gastroesophageal reflux disease)   . Arthritis     Knees  . Atrial fibrillation     Remote amiodarone use, recurred in 07/2011 post op from knee arthroplasty  . Essential hypertension, benign   . Obese   . DJD (degenerative joint disease), thoracolumbar   . Asymptomatic cholelithiasis   . Diverticulosis   . History of blood transfusion 08/31/2011    A+ type  . History of GI bleed     Gastritis, was on Xarelto and ASA    Social History Ms. Torti reports that she has never smoked. She has never used smokeless tobacco. Ms. Woodford reports that she does not drink alcohol.  Review of Systems Chronic right knee pain. Stable appetite. No  spontaneous bleeding. Otherwise negative.  Physical Examination Filed Vitals:   09/27/12 1303  BP: 143/79  Pulse: 67   Filed Weights   09/27/12 1303  Weight: 155 lb (70.308 kg)   Comfortable at rest. HEENT: Conjunctiva and lids normal, oropharynx clear. Neck: Supple, no elevated JVP, soft right carotid bruits, no thyromegaly. Lungs: Clear to auscultation, nonlabored breathing at rest. Cardiac: Regular rate and rhythm, no S3 or significant systolic murmur, no pericardial rub. Abdomen: Soft, nontender,bowel sounds present, no guarding or rebound. Extremities: No pitting edema, distal pulses 2+. Skin: Warm and dry. Musculoskeletal: No kyphosis. Neuropsychiatric: Alert and oriented x3, affect grossly appropriate.   Problem List and Plan   Atrial fibrillation Paroxysmal, noted postoperatively last year. She reports no palpitations, no functional limitations at this time from a cardiac perspective. ECG shows normal sinus rhythm. Anticoagulation is not planned in this patient based on prior discussion with Dr.DeGent and patient preference. Records reviewed. She has a history of significant GI bleed on Xarelto. At this particular time she is not on any rate lowering agents. I discussed with her the possibility of empiric perioperative beta blocker, but she was hesitant. Would recommend telemetry in the perioperative setting, [place] cardiology service can see her in consultation as needed as well.  Carotid artery occlusion Will obtain carotid Dopplers in November with office followup.  Essential hypertension, benign No change to current regimen.  HYPERLIPIDEMIA-MIXED She continues on statin therapy, followed by primary care.    Jonelle Sidle, M.D., F.A.C.C.

## 2012-09-27 NOTE — Assessment & Plan Note (Signed)
Paroxysmal, noted postoperatively last year. She reports no palpitations, no functional limitations at this time from a cardiac perspective. ECG shows normal sinus rhythm. Anticoagulation is not planned in this patient based on prior discussion with Dr.DeGent and patient preference. Records reviewed. She has a history of significant GI bleed on Xarelto. At this particular time she is not on any rate lowering agents. I discussed with her the possibility of empiric perioperative beta blocker, but she was hesitant. Would recommend telemetry in the perioperative setting, [place] cardiology service can see her in consultation as needed as well.

## 2012-09-27 NOTE — Assessment & Plan Note (Signed)
She continues on statin therapy, followed by primary care.

## 2012-09-28 ENCOUNTER — Other Ambulatory Visit: Payer: Self-pay | Admitting: Orthopedic Surgery

## 2012-09-28 MED ORDER — DEXAMETHASONE SODIUM PHOSPHATE 10 MG/ML IJ SOLN: 10.0000 mg | Freq: Once | INTRAMUSCULAR | Status: DC

## 2012-09-28 MED ORDER — BUPIVACAINE LIPOSOME 1.3 % IJ SUSP: 20.0000 mL | Freq: Once | INTRAMUSCULAR | Status: DC

## 2012-09-28 NOTE — Progress Notes (Signed)
Preoperative surgical orders have been place into the Epic hospital system for [name] [name] on 09/28/2012, 8:26 AM  by [name] [name] for surgery on 10/08/12.  Preop Total Knee orders including Experal, IV Tylenol, and IV Decadron as long as there are no contraindications to the above medications. [name] Peace, PA-C

## 2012-10-02 ENCOUNTER — Encounter (HOSPITAL_COMMUNITY): Payer: Self-pay

## 2012-10-02 ENCOUNTER — Encounter (HOSPITAL_COMMUNITY)
Admission: RE | Admit: 2012-10-02 | Discharge: 2012-10-02 | Disposition: A | Discharge status: Home / Self Care | Payer: Medicare Other | Source: Ambulatory Visit | Attending: Orthopedic Surgery | Admitting: Orthopedic Surgery

## 2012-10-02 HISTORY — DX: Personal history of other diseases of the respiratory system: Z87.09

## 2012-10-02 HISTORY — DX: Mumps without complication: B26.9

## 2012-10-02 HISTORY — DX: Carpal tunnel syndrome, unspecified upper limb: G56.00

## 2012-10-02 HISTORY — DX: Unspecified hemorrhoids: K64.9

## 2012-10-02 HISTORY — DX: Measles without complication: B05.9

## 2012-10-02 HISTORY — DX: Rheumatoid arthritis, unspecified: M06.9

## 2012-10-02 LAB — COMPREHENSIVE METABOLIC PANEL
ALT: 7 U/L (ref 0–35)
AST: 18 U/L (ref 0–37)
Alkaline Phosphatase: 60 U/L (ref 39–117)
CO2: 28 mEq/L (ref 19–32)
Chloride: 97 mEq/L (ref 96–112)
GFR calc Af Amer: 75 mL/min — ABNORMAL LOW (ref >90)
GFR calc non Af Amer: 65 mL/min — ABNORMAL LOW (ref >90)
Glucose, Bld: 91 mg/dL (ref 70–99)
Potassium: 4.2 mEq/L (ref 3.5–5.1)
Sodium: 134 mEq/L — ABNORMAL LOW (ref 135–145)
Total Bilirubin: 0.2 mg/dL — ABNORMAL LOW (ref 0.3–1.2)

## 2012-10-02 LAB — CBC
Hemoglobin: 11.9 g/dL — ABNORMAL LOW (ref 12.0–15.0)
MCH: 29 pg (ref 26.0–34.0)
RBC: 4.11 MIL/uL (ref 3.87–5.11)
WBC: 8.9 10*3/uL (ref 4.0–10.5)

## 2012-10-02 LAB — URINALYSIS, ROUTINE W REFLEX MICROSCOPIC
Glucose, UA: NEGATIVE mg/dL
Hgb urine dipstick: NEGATIVE
Ketones, ur: NEGATIVE mg/dL
Protein, ur: NEGATIVE mg/dL
pH: 6 (ref 5.0–8.0)

## 2012-10-02 LAB — URINE MICROSCOPIC-ADD ON

## 2012-10-02 LAB — SURGICAL PCR SCREEN: Staphylococcus aureus: POSITIVE — AB

## 2012-10-02 NOTE — Patient Instructions (Addendum)
20 YARESLY MENZEL  10/02/2012   Your procedure is scheduled on: 10/08/12  Report to [facility] at 8:35 AM.  Call this number if you have problems the morning of surgery [phone]: [phone]   Remember:   Do not eat food or drink liquids After Midnight.     Take these medicines the morning of surgery with A SIP OF WATER: xanax if needed, nexium, gabapentin, synthroid, simvastatin, lortab if needed   Do not wear jewelry, make-up or nail polish.  Do not wear lotions, powders, or perfumes. You may wear deodorant.  Do not shave 48 hours prior to surgery. Men may shave face and neck.  Do not bring valuables to the hospital.  Contacts, dentures or bridgework may not be worn into surgery.  Leave suitcase in the car. After surgery it may be brought to your room.  For patients admitted to the hospital, checkout time is 11:00 AM the day of discharge.    Please read over the following fact sheets that you were given: MRSA Information, incentive spirometry fact sheet, blood fact sheet Birdie Sons, RN  pre op nurse call if needed [phone]    FAILURE TO FOLLOW THESE INSTRUCTIONS MAY RESULT IN CANCELLATION OF YOUR SURGERY   Patient Signature: ___________________________________________

## 2012-10-02 NOTE — Progress Notes (Signed)
Chest x-ray 09/03/12 on EPIC, EKG 09/27/12 on EPIC, surgery clearance note Dr. Daphine Deutscher 08/30/12 on chart

## 2012-10-05 ENCOUNTER — Ambulatory Visit (INDEPENDENT_AMBULATORY_CARE_PROVIDER_SITE_OTHER): Payer: Medicare Other

## 2012-10-05 ENCOUNTER — Telehealth: Payer: Self-pay | Admitting: Nurse Practitioner

## 2012-10-05 ENCOUNTER — Ambulatory Visit (INDEPENDENT_AMBULATORY_CARE_PROVIDER_SITE_OTHER): Payer: Medicare Other | Admitting: Family Medicine

## 2012-10-05 ENCOUNTER — Encounter: Payer: Self-pay | Admitting: Family Medicine

## 2012-10-05 VITALS — BP 166/63 | HR 70 | Temp 97.9°F | Wt 169.0 lb

## 2012-10-05 DIAGNOSIS — M25579 Pain in unspecified ankle and joints of unspecified foot: Secondary | ICD-10-CM

## 2012-10-05 DIAGNOSIS — S9030XA Contusion of unspecified foot, initial encounter: Secondary | ICD-10-CM

## 2012-10-05 DIAGNOSIS — M25571 Pain in right ankle and joints of right foot: Secondary | ICD-10-CM

## 2012-10-05 DIAGNOSIS — S9031XA Contusion of right foot, initial encounter: Secondary | ICD-10-CM | POA: Insufficient documentation

## 2012-10-05 LAB — URINE CULTURE: Colony Count: >=100000

## 2012-10-05 NOTE — Progress Notes (Signed)
Patient ID: [name] [name], female   DOB: July 05, 1930, 79 y.o.   MRN: [id number] SUBJECTIVE: CC: Chief Complaint  Patient presents with  . Acute Visit    ?fx foot    HPI: Having knee replacement surgery on Monday.For the past week has been elevting and resting the right foot due to a contusion. Something fell on her foot. There was no concern by the orthopedist if the skin was not broken (per patient). But it is still painful so she came to get it  Checked in case it is.  Past Medical History  Diagnosis Date  . Mixed hyperlipidemia   . Hypothyroidism   . GERD (gastroesophageal reflux disease)   . Atrial fibrillation     Remote amiodarone use, recurred in 07/2011 post op from knee arthroplasty  . Obese   . Asymptomatic cholelithiasis   . History of blood transfusion 08/31/2011    A+ type  . History of GI bleed     Gastritis, was on Xarelto and ASA  . Carpal tunnel syndrome   . Arthritis     Knees  . DJD (degenerative joint disease), thoracolumbar   . Rheumatoid arthritis   . H/O bronchitis   . Hemorrhoids   . Urinary incontinence   . Measles     as child  . Mumps     as child  . Carotid artery disease    Past Surgical History  Procedure Laterality Date  . Tonsillectomy    . Cholecystectomy  04/29/2005    Dr. Luretha Murphy  . Thoracotomy  4/07    Removal bronchogenic cyst  . Total knee arthroplasty  08/10/2011    Procedure: TOTAL KNEE ARTHROPLASTY;  Surgeon: Loanne Drilling, MD;  Location: [facility];  Service: Orthopedics;  Laterality: Left;  . Rotator cuff repair  1992, 1993    Bilateral  . Tubal ligation      Bilateral  . Ventral hernia repair  05/25/2005    Incarcerated, lysis of adhesions, Dr. Luretha Murphy  . Appendectomy      as child  . Bronchogenic cyst  2007   History   Social History  . Marital Status: Widowed    Spouse Name: N/A    Number of Children: N/A  . Years of Education: N/A   Occupational History  . Not on file.   Social History Main Topics  .  Smoking status: Never Smoker   . Smokeless tobacco: Never Used  . Alcohol Use: No  . Drug Use: No  . Sexually Active: Not on file   Other Topics Concern  . Not on file   Social History Narrative  . No narrative on file   Family History  Problem Relation Age of Onset  . Cancer    . Stroke    . Diabetes    . Hypertension    . Colon cancer Neg Hx   . Colon polyps Neg Hx   . Rectal cancer Neg Hx   . Stomach cancer Neg Hx    Current Outpatient Prescriptions on File Prior to Visit  Medication Sig Dispense Refill  . ALPRAZolam (XANAX) 0.5 MG tablet Take 0.5-1 mg by mouth 3 (three) times daily as needed for sleep or anxiety. Take 1 in morning and 2 every evening for anxiety      . esomeprazole (NEXIUM) 40 MG capsule Take 40 mg by mouth daily.      [name] gabapentin (NEURONTIN) 300 MG capsule Take 300 mg by mouth 2 (two) times daily.      [name]  levothyroxine (SYNTHROID, LEVOTHROID) 137 MCG tablet Take 137 mcg by mouth daily before breakfast.      . PREMARIN vaginal cream Place 0.625 Applicatorfuls vaginally daily.      . simvastatin (ZOCOR) 20 MG tablet Take 20 mg by mouth every morning.      . tolterodine (DETROL LA) 4 MG 24 hr capsule Take 4 mg by mouth daily.      [name] aspirin EC 81 MG tablet Take 81 mg by mouth every morning.      . Cholecalciferol (VITAMIN D) 2000 UNITS CAPS Take 2,000 Units by mouth daily.       . ferrous sulfate 325 (65 FE) MG tablet Take 325 mg by mouth 2 (two) times daily.       . fish oil-omega-3 fatty acids 1000 MG capsule Take 1 g by mouth daily.      [name] GARLIC PO Take 1 tablet by mouth daily.      [name] HYDROcodone-acetaminophen (LORTAB) 7.5-500 MG per tablet Take 1 tablet by mouth every 8 (eight) hours as needed for pain.       [name] LUTEIN-ZEAXANTHIN PO Take 1 tablet by mouth daily.      . Multiple Vitamin (MULTIVITAMIN WITH MINERALS) TABS Take 1 tablet by mouth daily.      [name] oxaprozin (DAYPRO) 600 MG tablet Take 600 mg by mouth 2 (two) times daily.       . vitamin C  (ASCORBIC ACID) 500 MG tablet Take 500 mg by mouth daily.       . Zinc 50 MG TABS Take 50 mg by mouth daily.       . [DISCONTINUED] tolterodine (DETROL LA) 4 MG 24 hr capsule Take 4 mg by mouth daily.        No current facility-administered medications on file prior to visit.   Allergies  Allergen Reactions  . Omeprazole Nausea And Vomiting  . Penicillins Itching  . Morphine Rash    And hot flashes  . Xarelto (Rivaroxaban) Other (See Comments)    Bleeding,spitting up blood    There is no immunization history on file for this patient. Prior to Admission medications   Medication Sig Start Date End Date Taking? Authorizing Provider  ALPRAZolam Prudy Feeler) 0.5 MG tablet Take 0.5-1 mg by mouth 3 (three) times daily as needed for sleep or anxiety. Take 1 in morning and 2 every evening for anxiety 06/26/12  Yes Horald Pollen, PA-C  esomeprazole (NEXIUM) 40 MG capsule Take 40 mg by mouth daily. 06/26/12  Yes Horald Pollen, PA-C  gabapentin (NEURONTIN) 300 MG capsule Take 300 mg by mouth 2 (two) times daily. 06/26/12  Yes Horald Pollen, PA-C  levothyroxine (SYNTHROID, LEVOTHROID) 137 MCG tablet Take 137 mcg by mouth daily before breakfast.   Yes Historical Provider, MD  PREMARIN vaginal cream Place 0.625 Applicatorfuls vaginally daily. 03/19/12  Yes Historical Provider, MD  simvastatin (ZOCOR) 20 MG tablet Take 20 mg by mouth every morning. 06/26/12  Yes Horald Pollen, PA-C  tolterodine (DETROL LA) 4 MG 24 hr capsule Take 4 mg by mouth daily. 06/26/12  Yes Horald Pollen, PA-C  aspirin EC 81 MG tablet Take 81 mg by mouth every morning.    Historical Provider, MD  Cholecalciferol (VITAMIN D) 2000 UNITS CAPS Take 2,000 Units by mouth daily.     Historical Provider, MD  ferrous sulfate 325 (65 FE) MG tablet Take 325 mg by mouth 2 (two) times daily.     Historical Provider, MD  fish oil-omega-3 fatty acids 1000 MG capsule Take 1 g by mouth daily.    Historical Provider, MD  GARLIC PO Take 1 tablet by mouth  daily.    Historical Provider, MD  HYDROcodone-acetaminophen (LORTAB) 7.5-500 MG per tablet Take 1 tablet by mouth every 8 (eight) hours as needed for pain.     Historical Provider, MD  LUTEIN-ZEAXANTHIN PO Take 1 tablet by mouth daily.    Historical Provider, MD  Multiple Vitamin (MULTIVITAMIN WITH MINERALS) TABS Take 1 tablet by mouth daily.    Historical Provider, MD  oxaprozin (DAYPRO) 600 MG tablet Take 600 mg by mouth 2 (two) times daily.     Historical Provider, MD  vitamin C (ASCORBIC ACID) 500 MG tablet Take 500 mg by mouth daily.     Historical Provider, MD  Zinc 50 MG TABS Take 50 mg by mouth daily.     Historical Provider, MD     ROS: As above in the HPI. All other systems are stable or negative.  OBJECTIVE: APPEARANCE:  Patient in no acute distress.The patient appeared well nourished and normally developed. Acyanotic. Waist: VITAL SIGNS:BP 166/63  Pulse 70  Temp(Src) 97.9 F (36.6 C) (Oral)  Wt 169 lb (76.658 kg)  BMI 27.69 kg/m2 Elderly WF  SKIN: warm and  Dry without overt rashes, tattoos and scars  HEAD and Neck: without JVD, Head and scalp: normal Eyes:No scleral icterus. Fundi normal, eye movements normal. Ears: Auricle normal, canal normal, Tympanic membranes normal, insufflation normal. Nose: normal Throat: normal Neck & thyroid: normal  CHEST & LUNGS: Chest wall: normal Lungs: Clear  CVS: Reveals the PMI to be normally located. Regular rhythm, First and Second Heart sounds are normal,  absence of murmurs, rubs or gallops. Peripheral vasculature: Radial pulses: normal Dorsal pedis pulses: normal Posterior pulses: normal  ABDOMEN:  Appearance:obese Benign, no organomegaly, no masses, no Abdominal Aortic enlargement. No Guarding , no rebound. No Bruits. Bowel sounds: normal  RECTAL: N/A GU: N/A  EXTREMETIES: ecchymosis on the dorsal right foot. Both Femoral and Pedal pulses are normal.  MUSCULOSKELETAL:  Spine: normal Joints: left TKR.  Right knee, thickened swollen and  Arthritic with crepitus. Causing limping by the patient. Right foot not swollen but it is old ecchymosis. Tender on the top of the foot especially the 5th 4th 3rd metatarsals area.  NEUROLOGIC: oriented to time,place and person; nonfocal. Strength is normal Sensory is normal Reflexes are normal Cranial Nerves are normal.  ASSESSMENT: Pain in joint, ankle and foot, right - Plan: DG Foot Complete Right  Contusion of right foot, initial encounter    PLAN: Hold meds except nexium,gabapentin,levothyroxine Wished her well on her TKR on Monday  [facility] reading (PRIMARY) by  Dr. Modesto Charon: no fracture seen.  Elevate ice  Rest, compress.  Wished her well on her surgery.  Return if symptoms worsen or fail to improve.  Jamya Starry P. [place]esto Charon, M.D.

## 2012-10-05 NOTE — Telephone Encounter (Signed)
appt given for today 

## 2012-10-07 ENCOUNTER — Other Ambulatory Visit: Payer: Self-pay | Admitting: Orthopedic Surgery

## 2012-10-07 NOTE — H&P (Signed)
[name] [name]  DOB: 06/07/1930 Undefined / Language: English / Race: White Female  Date of Admission:  10/08/2012  Chief Complaint:  Right Knee Pain  History of Present Illness The patient is a 79 year old female who comes in for a preoperative History and Physical. The patient is scheduled for a right total knee arthroplasty to be performed by Dr. Frank V. Aluisio, MD at [facility] on 10/08/2012. The patient is a 79 year old female presenting for a post-operative visit. The patient is now out from her left total knee arthroplasty. The patient states that she is doing fair (has a sore area on the anterior medial side) at this time. The pain is under excellent control at this time and describe their pain as mild and mild to moderate. The patient is currently doing home exercise program. The patient feels that they are progressing well at this time. She said the left knee is doing very well at this time. She notes considerable improvement compared to preop. She still has a little soreness but nothing intolerable. The right knee is what is giving her the most problem. She has known advanced severe arthritis in the right knee. She is at a stage where she really wants to get that fixed. She has concerns given her difficult postop course on the other side but she is now ready to proceed with her right knee replacement. They have been treated conservatively in the past for the above stated problem and despite conservative measures, they continue to have progressive pain and severe functional limitations and dysfunction. They have failed non-operative management including home exercise, medications. It is felt that they would benefit from undergoing total joint replacement. Risks and benefits of the procedure have been discussed with the patient and they elect to proceed with surgery. There are no active contraindications to surgery such as ongoing infection or rapidly progressive  neurological disease.   Problem List S/P total knee replacement (V43.65) Primary osteoarthritis of one knee (715.16)   Allergies  Penicillin VK *PENICILLINS*. Rash. Morphine Sulfate *ANALGESICS - OPIOID*. Rash. Xarelto *ANTICOAGULANTS*. Gross Hematemisis   Family History  Siblings. Brother, Sister. cancer Cerebrovascular Accident. grandfather fathers side Mother. deceased age 92 due to complications from old age Father. deceased age at 92 due to complications from a stroke   Social History  Number of flights of stairs before winded. 2-3 Marital status. widowed Pain Contract. no Illicit drug use. no Exercise. Exercises weekly; does running / walking and other Post-Surgical Plans. [facility] Advance Directives. Living Will, Healthcare POA Drug/Alcohol Rehab (Previously). no Drug/Alcohol Rehab (Currently). no Living situation. Lives alone. Tobacco use. never smoker Tobacco / smoke exposure. no Current work status. retired Children. 2 Alcohol use. never consumed alcohol   Medication History  ALPRAZolam (0.5MG Tablet, Oral three times daily, as needed) Active. Cholecalciferol (2000UNIT Tablet, Oral daily) Active. NexIUM (40MG Capsule DR, Oral daily) Active. Ferrous Sulfate EC (325MG Tablet DR, Oral two times daily) Active. Gabapentin (300MG Capsule, two tablets Oral daily) Active. Hydrocodone-Acetaminophen (7.5-325MG Tablet, Oral every 8 hours PRN) Active. Levothyroxine Sodium (175MCG Tablet, Oral daily) Active. Multivitamins ( Oral daily) Active. Oxaprozin (600MG Tablet, Oral two times daily) Active. Premarin (0.625MG/GM Cream, Vaginal) Active. Simvastatin (20MG Tablet, Oral daily) Active. Tolterodine Tartrate (2MG Tablet, two tablets Oral daily) Active. Vitamin C Plus (500MG Tablet, Oral daily) Active. Zinc (50MG Tablet, Oral) Active. Aspirin Childrens (81MG Tablet Chewable, Oral) Active.   Past Surgical History Bronchogenic Cyst  removed 2007 Appendectomy. as a child   Tubal Ligation Gallbladder Surgery. laporoscopic 2007 Rotator Cuff Repair. bilateral 1992, 1993 Total Knee Replacement - Left  Medical History Rheumatoid Arthritis Cataract Vertigo Varicose veins Bronchitis Hypercholesterolemia Atrial Fibrillation. Patient had new onset episode the last time following her Left Total Knee Replacement. Gastroesophageal Reflux Disease Hemorrhoids Urinary Tract Infection Urinary Incontinence Hypothyroidism Measles. childhood Mumps. childhood   Review of Systems  General:Not Present- Chills, Fever, Night Sweats, Fatigue, Weight Gain, Weight Loss and Memory Loss. Skin:Not Present- Hives, Itching, Rash, Eczema and Lesions. HEENT:Not Present- Tinnitus, Headache, Double Vision, Visual Loss, Hearing Loss and Dentures. Respiratory:Present- Shortness of breath with exertion, Cough and Wheezing. Not Present- Shortness of breath at rest, Allergies, Coughing up blood and Chronic Cough. Cardiovascular:Present- Leg Pain and/or Swelling. Not Present- Chest Pain, Racing/skipping heartbeats, Difficulty Breathing Lying Down, Murmur, Swelling and Palpitations. Gastrointestinal:Present- Heartburn, Abdominal Pain and Constipation. Not Present- Bloody Stool, Vomiting, Nausea, Diarrhea, Difficulty Swallowing, Jaundice and Loss of appetitie. Female Genitourinary:Present- Urinary frequency, Incontinence and Urinating at Night. Not Present- Blood in Urine, Weak urinary stream, Discharge, Flank Pain, Painful Urination, Urgency and Urinary Retention. Musculoskeletal:Present- Joint Swelling, Joint Pain, Back Pain and Morning Stiffness. Not Present- Muscle Weakness, Muscle Pain and Spasms. Neurological:Not Present- Tremor, Dizziness, Blackout spells, Paralysis, Difficulty with balance and Weakness. Psychiatric:Present- Insomnia.   Vitals Weight: 167 lb Height: 65.5 in Weight was reported by patient. Height was reported  by patient. Body Surface Area: 1.87 m Body Mass Index: 27.37 kg/m Pulse: 60 (Regular) Resp.: 14 (Unlabored) BP: 138/68 (Sitting, Right Arm, Standard)    Physical Exam  The physical exam findings are as follows:  Note: Patient is a 79 year old female with continued knee pain. Patient is accompanied today by her sister at the H&P.   General Mental Status - Alert, cooperative and good historian. General Appearance- pleasant and Anxious. Not in acute distress. Orientation- Oriented X3. Build & Nutrition- Overweight, Well nourished and Well developed.   Head and Neck Head- normocephalic, atraumatic . Neck Global Assessment- supple. no bruit auscultated on the right and no bruit auscultated on the left.   Eye Pupil- Bilateral- Normal. Motion- Bilateral- EOMI.   Chest and Lung Exam Auscultation: Breath sounds:- clear at anterior chest wall and - clear at posterior chest wall. Adventitious sounds:- No Adventitious sounds.   Cardiovascular Auscultation:Rhythm- Regular rate and rhythm. Heart Sounds- S1 WNL and S2 WNL. Murmurs & Other Heart Sounds: Murmur 1:Location- Aortic Area. Timing- Mid-systolic. Grade- III/VI. Character- Crescendo/Decrescendo.   Abdomen Inspection:Contour- Generalized mild distention. Palpation/Percussion:Tenderness- Abdomen is non-tender to palpation. Rigidity (guarding)- Abdomen is soft. Auscultation:Auscultation of the abdomen reveals - Bowel sounds normal.   Female Genitourinary  Not done, not pertinent to present illness  Peripheral Vascular Upper Extremity: Palpation:- Pulses bilaterally normal. Lower Extremity: Palpation:- Pulses bilaterally normal.   Neurologic Examination of related systems reveals - normal muscle strength and tone in all extremities. Neurologic evaluation reveals - normal sensation and upper and lower extremity deep tendon reflexes intact bilaterally  .   Musculoskeletal On exam she is in no distress. Her left knee looks fine. Range of motion 0 to 115 degrees. There is no tenderness or instability on the left. Right knee severe varus deformity, range about 5 to 115 with marked crepitus on range of motion. Some pseudolaxity with valgus stressing and no gross instability noted.  RADIOGRAPHS: She is bone on bone in the medial and patellofemoral compartments. She has about a 5 degree varus on the right knee.  Assessment & Plan  Primary osteoarthritis of one knee (  715.16) Impression: Right Knee  Note: Plan is for a Right Total Knee Replacement by Dr. Aluisio.  Plan is to go to [facility]. She lives alone.  The patient will not receive TXA (tranexamic acid) due to: Peripheral Arterial Disease  Signed electronically by Jethro Radke L Dove Gresham, III PA-C 

## 2012-10-08 ENCOUNTER — Inpatient Hospital Stay (HOSPITAL_COMMUNITY): Payer: Medicare Other | Admitting: *Deleted

## 2012-10-08 ENCOUNTER — Encounter (HOSPITAL_COMMUNITY): Payer: Self-pay | Admitting: *Deleted

## 2012-10-08 ENCOUNTER — Inpatient Hospital Stay (HOSPITAL_COMMUNITY)
Admission: RE | Admit: 2012-10-08 | Discharge: 2012-10-13 | DRG: 470 | Disposition: A | Discharge status: Skilled Nursing Facility | Payer: Medicare Other | Source: Ambulatory Visit | Attending: Orthopedic Surgery | Admitting: Orthopedic Surgery

## 2012-10-08 ENCOUNTER — Encounter (HOSPITAL_COMMUNITY)
Admission: RE | Disposition: A | Discharge status: Skilled Nursing Facility | Payer: Self-pay | Source: Ambulatory Visit | Attending: Orthopedic Surgery

## 2012-10-08 DIAGNOSIS — Y831 Surgical operation with implant of artificial internal device as the cause of abnormal reaction of the patient, or of later complication, without mention of misadventure at the time of the procedure: Secondary | ICD-10-CM | POA: Diagnosis not present

## 2012-10-08 DIAGNOSIS — I4949 Other premature depolarization: Secondary | ICD-10-CM | POA: Diagnosis not present

## 2012-10-08 DIAGNOSIS — M25469 Effusion, unspecified knee: Secondary | ICD-10-CM | POA: Diagnosis present

## 2012-10-08 DIAGNOSIS — Z96651 Presence of right artificial knee joint: Secondary | ICD-10-CM

## 2012-10-08 DIAGNOSIS — Z9289 Personal history of other medical treatment: Secondary | ICD-10-CM

## 2012-10-08 DIAGNOSIS — I739 Peripheral vascular disease, unspecified: Secondary | ICD-10-CM | POA: Diagnosis present

## 2012-10-08 DIAGNOSIS — I498 Other specified cardiac arrhythmias: Secondary | ICD-10-CM | POA: Diagnosis not present

## 2012-10-08 DIAGNOSIS — D62 Acute posthemorrhagic anemia: Secondary | ICD-10-CM | POA: Diagnosis not present

## 2012-10-08 DIAGNOSIS — Z01812 Encounter for preprocedural laboratory examination: Secondary | ICD-10-CM

## 2012-10-08 DIAGNOSIS — E039 Hypothyroidism, unspecified: Secondary | ICD-10-CM | POA: Diagnosis present

## 2012-10-08 DIAGNOSIS — I493 Ventricular premature depolarization: Secondary | ICD-10-CM

## 2012-10-08 DIAGNOSIS — T45515A Adverse effect of anticoagulants, initial encounter: Secondary | ICD-10-CM | POA: Diagnosis not present

## 2012-10-08 DIAGNOSIS — R112 Nausea with vomiting, unspecified: Secondary | ICD-10-CM | POA: Diagnosis present

## 2012-10-08 DIAGNOSIS — M1710 Unilateral primary osteoarthritis, unspecified knee: Principal | ICD-10-CM | POA: Diagnosis present

## 2012-10-08 DIAGNOSIS — Z96659 Presence of unspecified artificial knee joint: Secondary | ICD-10-CM

## 2012-10-08 DIAGNOSIS — E663 Overweight: Secondary | ICD-10-CM | POA: Diagnosis present

## 2012-10-08 DIAGNOSIS — IMO0002 Reserved for concepts with insufficient information to code with codable children: Secondary | ICD-10-CM | POA: Diagnosis not present

## 2012-10-08 DIAGNOSIS — I70209 Unspecified atherosclerosis of native arteries of extremities, unspecified extremity: Secondary | ICD-10-CM | POA: Diagnosis present

## 2012-10-08 DIAGNOSIS — K219 Gastro-esophageal reflux disease without esophagitis: Secondary | ICD-10-CM | POA: Diagnosis present

## 2012-10-08 DIAGNOSIS — M069 Rheumatoid arthritis, unspecified: Secondary | ICD-10-CM | POA: Diagnosis present

## 2012-10-08 DIAGNOSIS — Z6828 Body mass index (BMI) 28.0-28.9, adult: Secondary | ICD-10-CM

## 2012-10-08 DIAGNOSIS — E871 Hypo-osmolality and hyponatremia: Secondary | ICD-10-CM | POA: Diagnosis not present

## 2012-10-08 DIAGNOSIS — I4891 Unspecified atrial fibrillation: Secondary | ICD-10-CM | POA: Diagnosis not present

## 2012-10-08 HISTORY — DX: Nausea with vomiting, unspecified: R11.2

## 2012-10-08 HISTORY — DX: Other specified postprocedural states: Z98.890

## 2012-10-08 HISTORY — PX: TOTAL KNEE ARTHROPLASTY: SHX125

## 2012-10-08 SURGERY — ARTHROPLASTY, KNEE, TOTAL
Anesthesia: General | Site: Knee | Laterality: Right | Wound class: Clean

## 2012-10-08 MED ORDER — MENTHOL 3 MG MT LOZG: 1.0000 | LOZENGE | OROMUCOSAL | Status: DC | PRN

## 2012-10-08 MED ORDER — ONDANSETRON HCL 4 MG PO TABS
4.0000 mg | ORAL_TABLET | Freq: Four times a day (QID) | ORAL | Status: DC | PRN
  Administered 2012-10-09 – 2012-10-10 (×2): 4 mg via ORAL
  Filled 2012-10-08 (×2): qty 1

## 2012-10-08 MED ORDER — DEXTROSE-NACL 5-0.9 % IV SOLN
INTRAVENOUS | Status: DC
  Administered 2012-10-08 – 2012-10-09 (×3): via INTRAVENOUS
  Administered 2012-10-11: 75 mL/h via INTRAVENOUS
  Administered 2012-10-11 – 2012-10-12 (×3): via INTRAVENOUS

## 2012-10-08 MED ORDER — SODIUM CHLORIDE 0.9 % IJ SOLN
INTRAMUSCULAR | Status: DC | PRN
  Administered 2012-10-08: 30 mL via INTRAVENOUS

## 2012-10-08 MED ORDER — BISACODYL 10 MG RE SUPP: 10.0000 mg | Freq: Every day | RECTAL | Status: DC | PRN

## 2012-10-08 MED ORDER — DEXTROSE 5 % IV SOLN
3.0000 g | INTRAVENOUS | Status: AC
  Administered 2012-10-08: 2 g via INTRAVENOUS

## 2012-10-08 MED ORDER — ONDANSETRON HCL 4 MG/2ML IJ SOLN
INTRAMUSCULAR | Status: DC | PRN
  Administered 2012-10-08: 4 mg via INTRAVENOUS

## 2012-10-08 MED ORDER — VANCOMYCIN HCL 1000 MG IV SOLR
1000.0000 mg | INTRAVENOUS | Status: DC | PRN
  Administered 2012-10-08: 1000 mg via INTRAVENOUS

## 2012-10-08 MED ORDER — ACETAMINOPHEN 500 MG PO TABS
1000.0000 mg | ORAL_TABLET | Freq: Four times a day (QID) | ORAL | Status: AC
  Administered 2012-10-08 – 2012-10-09 (×4): 1000 mg via ORAL
  Filled 2012-10-08 (×4): qty 2

## 2012-10-08 MED ORDER — FERROUS SULFATE 325 (65 FE) MG PO TABS
325.0000 mg | ORAL_TABLET | Freq: Two times a day (BID) | ORAL | Status: DC
  Administered 2012-10-08 – 2012-10-13 (×6): 325 mg via ORAL
  Filled 2012-10-08 (×11): qty 1

## 2012-10-08 MED ORDER — OXYCODONE HCL 5 MG PO TABS
5.0000 mg | ORAL_TABLET | ORAL | Status: DC | PRN
  Administered 2012-10-08 (×2): 5 mg via ORAL
  Administered 2012-10-09 – 2012-10-10 (×3): 10 mg via ORAL
  Administered 2012-10-10: 5 mg via ORAL
  Administered 2012-10-11: 10 mg via ORAL
  Administered 2012-10-12: 5 mg via ORAL
  Administered 2012-10-12 – 2012-10-13 (×4): 10 mg via ORAL
  Filled 2012-10-08: qty 2
  Filled 2012-10-08: qty 1
  Filled 2012-10-08 (×5): qty 2
  Filled 2012-10-08 (×2): qty 1
  Filled 2012-10-08 (×2): qty 2
  Filled 2012-10-08 (×3): qty 1

## 2012-10-08 MED ORDER — CEFAZOLIN SODIUM 1-5 GM-% IV SOLN
1.0000 g | Freq: Four times a day (QID) | INTRAVENOUS | Status: AC
  Administered 2012-10-08 (×2): 1 g via INTRAVENOUS
  Filled 2012-10-08 (×2): qty 50

## 2012-10-08 MED ORDER — PROMETHAZINE HCL 25 MG/ML IJ SOLN: 6.2500 mg | INTRAMUSCULAR | Status: DC | PRN

## 2012-10-08 MED ORDER — DEXAMETHASONE SODIUM PHOSPHATE 4 MG/ML IJ SOLN
INTRAMUSCULAR | Status: DC | PRN
  Administered 2012-10-08: 10 mg via INTRAVENOUS

## 2012-10-08 MED ORDER — SIMVASTATIN 20 MG PO TABS
20.0000 mg | ORAL_TABLET | Freq: Every morning | ORAL | Status: DC
  Administered 2012-10-09 – 2012-10-12 (×4): 20 mg via ORAL
  Filled 2012-10-08 (×4): qty 1

## 2012-10-08 MED ORDER — METHOCARBAMOL 500 MG PO TABS
500.0000 mg | ORAL_TABLET | Freq: Four times a day (QID) | ORAL | Status: DC | PRN
  Administered 2012-10-08 – 2012-10-10 (×5): 500 mg via ORAL
  Filled 2012-10-08 (×5): qty 1

## 2012-10-08 MED ORDER — FENTANYL CITRATE 0.05 MG/ML IJ SOLN
25.0000 ug | INTRAMUSCULAR | Status: DC | PRN
  Administered 2012-10-08: 25 ug via INTRAVENOUS

## 2012-10-08 MED ORDER — BUPIVACAINE LIPOSOME 1.3 % IJ SUSP
INTRAMUSCULAR | Status: DC | PRN
  Administered 2012-10-08: 20 mL

## 2012-10-08 MED ORDER — FESOTERODINE FUMARATE ER 8 MG PO TB24
8.0000 mg | ORAL_TABLET | Freq: Every day | ORAL | Status: DC
  Administered 2012-10-08 – 2012-10-13 (×6): 8 mg via ORAL
  Filled 2012-10-08 (×6): qty 1

## 2012-10-08 MED ORDER — TRAMADOL HCL 50 MG PO TABS
50.0000 mg | ORAL_TABLET | Freq: Four times a day (QID) | ORAL | Status: DC | PRN
  Administered 2012-10-08 – 2012-10-11 (×6): 100 mg via ORAL
  Administered 2012-10-12: 50 mg via ORAL
  Administered 2012-10-12 – 2012-10-13 (×2): 100 mg via ORAL
  Filled 2012-10-08 (×7): qty 2
  Filled 2012-10-08: qty 1
  Filled 2012-10-08 (×2): qty 2

## 2012-10-08 MED ORDER — SODIUM CHLORIDE 0.9 % IV SOLN: INTRAVENOUS | Status: DC

## 2012-10-08 MED ORDER — DEXAMETHASONE 4 MG PO TABS
10.0000 mg | ORAL_TABLET | Freq: Every day | ORAL | Status: AC
  Administered 2012-10-09: 10 mg via ORAL
  Filled 2012-10-08: qty 1

## 2012-10-08 MED ORDER — ESOMEPRAZOLE MAGNESIUM 40 MG PO CPDR
40.0000 mg | DELAYED_RELEASE_CAPSULE | Freq: Every day | ORAL | Status: DC
  Administered 2012-10-09 – 2012-10-13 (×5): 40 mg via ORAL
  Filled 2012-10-08 (×6): qty 1

## 2012-10-08 MED ORDER — POLYETHYLENE GLYCOL 3350 17 G PO PACK
17.0000 g | PACK | Freq: Every day | ORAL | Status: DC | PRN
  Filled 2012-10-08: qty 1

## 2012-10-08 MED ORDER — HYDROMORPHONE HCL PF 1 MG/ML IJ SOLN
INTRAMUSCULAR | Status: DC | PRN
  Administered 2012-10-08 (×2): 1 mg via INTRAVENOUS
  Administered 2012-10-08: 2 mg via INTRAVENOUS

## 2012-10-08 MED ORDER — ENOXAPARIN SODIUM 30 MG/0.3ML ~~LOC~~ SOLN
30.0000 mg | Freq: Two times a day (BID) | SUBCUTANEOUS | Status: DC
  Administered 2012-10-09 (×2): 30 mg via SUBCUTANEOUS
  Filled 2012-10-08 (×5): qty 0.3

## 2012-10-08 MED ORDER — KETAMINE HCL 10 MG/ML IJ SOLN
INTRAMUSCULAR | Status: DC | PRN
  Administered 2012-10-08: 12 mg via INTRAVENOUS

## 2012-10-08 MED ORDER — DEXAMETHASONE SODIUM PHOSPHATE 10 MG/ML IJ SOLN
10.0000 mg | Freq: Every day | INTRAMUSCULAR | Status: AC
  Filled 2012-10-08: qty 1

## 2012-10-08 MED ORDER — METOCLOPRAMIDE HCL 5 MG/ML IJ SOLN
INTRAMUSCULAR | Status: DC | PRN
  Administered 2012-10-08: 10 mg via INTRAVENOUS

## 2012-10-08 MED ORDER — PHENYLEPHRINE HCL 10 MG/ML IJ SOLN
INTRAMUSCULAR | Status: DC | PRN
  Administered 2012-10-08: 80 ug via INTRAVENOUS

## 2012-10-08 MED ORDER — ACETAMINOPHEN 500 MG PO TABS
1000.0000 mg | ORAL_TABLET | Freq: Once | ORAL | Status: AC
  Administered 2012-10-08: 1000 mg via ORAL
  Filled 2012-10-08: qty 2

## 2012-10-08 MED ORDER — METOCLOPRAMIDE HCL 5 MG PO TABS
5.0000 mg | ORAL_TABLET | Freq: Three times a day (TID) | ORAL | Status: DC | PRN
  Filled 2012-10-08: qty 2

## 2012-10-08 MED ORDER — LACTATED RINGERS IV SOLN
INTRAVENOUS | Status: DC
  Administered 2012-10-08: 1000 mL via INTRAVENOUS
  Administered 2012-10-08: 13:00:00 via INTRAVENOUS

## 2012-10-08 MED ORDER — BUPIVACAINE LIPOSOME 1.3 % IJ SUSP
20.0000 mL | Freq: Once | INTRAMUSCULAR | Status: DC
  Filled 2012-10-08: qty 20

## 2012-10-08 MED ORDER — LACTATED RINGERS IV SOLN: INTRAVENOUS | Status: DC

## 2012-10-08 MED ORDER — FLEET ENEMA 7-19 GM/118ML RE ENEM: 1.0000 | ENEMA | Freq: Once | RECTAL | Status: AC | PRN

## 2012-10-08 MED ORDER — DIPHENHYDRAMINE HCL 12.5 MG/5ML PO ELIX: 12.5000 mg | ORAL_SOLUTION | ORAL | Status: DC | PRN

## 2012-10-08 MED ORDER — PHENOL 1.4 % MT LIQD: 1.0000 | OROMUCOSAL | Status: DC | PRN

## 2012-10-08 MED ORDER — ALPRAZOLAM 0.5 MG PO TABS
0.5000 mg | ORAL_TABLET | Freq: Three times a day (TID) | ORAL | Status: DC | PRN
  Administered 2012-10-08: 0.5 mg via ORAL
  Administered 2012-10-09: 1 mg via ORAL
  Administered 2012-10-09 – 2012-10-11 (×4): 0.5 mg via ORAL
  Administered 2012-10-12: 1 mg via ORAL
  Filled 2012-10-08 (×2): qty 1
  Filled 2012-10-08 (×2): qty 2
  Filled 2012-10-08 (×3): qty 1

## 2012-10-08 MED ORDER — DOCUSATE SODIUM 100 MG PO CAPS
100.0000 mg | ORAL_CAPSULE | Freq: Two times a day (BID) | ORAL | Status: DC
  Administered 2012-10-08 – 2012-10-13 (×10): 100 mg via ORAL
  Filled 2012-10-08 (×3): qty 1

## 2012-10-08 MED ORDER — LEVOTHYROXINE SODIUM 137 MCG PO TABS
137.0000 ug | ORAL_TABLET | Freq: Every day | ORAL | Status: DC
  Administered 2012-10-09 – 2012-10-13 (×5): 137 ug via ORAL
  Filled 2012-10-08 (×6): qty 1

## 2012-10-08 MED ORDER — NON FORMULARY: 40.0000 mg | Freq: Every day | Status: DC

## 2012-10-08 MED ORDER — SUCCINYLCHOLINE CHLORIDE 20 MG/ML IJ SOLN
INTRAMUSCULAR | Status: DC | PRN
  Administered 2012-10-08: 100 mg via INTRAVENOUS

## 2012-10-08 MED ORDER — ONDANSETRON HCL 4 MG/2ML IJ SOLN
4.0000 mg | Freq: Four times a day (QID) | INTRAMUSCULAR | Status: DC | PRN
  Administered 2012-10-08 – 2012-10-10 (×2): 4 mg via INTRAVENOUS
  Filled 2012-10-08 (×2): qty 2

## 2012-10-08 MED ORDER — ACETAMINOPHEN 650 MG RE SUPP
650.0000 mg | Freq: Four times a day (QID) | RECTAL | Status: AC
  Filled 2012-10-08: qty 1

## 2012-10-08 MED ORDER — HYDROMORPHONE HCL PF 1 MG/ML IJ SOLN: 0.2500 mg | INTRAMUSCULAR | Status: DC | PRN

## 2012-10-08 MED ORDER — EPHEDRINE SULFATE 50 MG/ML IJ SOLN
INTRAMUSCULAR | Status: DC | PRN
  Administered 2012-10-08: 10 mg via INTRAVENOUS
  Administered 2012-10-08: 5 mg via INTRAVENOUS
  Administered 2012-10-08: 10 mg via INTRAVENOUS

## 2012-10-08 MED ORDER — PROPOFOL 10 MG/ML IV BOLUS
INTRAVENOUS | Status: DC | PRN
  Administered 2012-10-08: 120 mg via INTRAVENOUS

## 2012-10-08 MED ORDER — METOCLOPRAMIDE HCL 5 MG/ML IJ SOLN
5.0000 mg | Freq: Three times a day (TID) | INTRAMUSCULAR | Status: DC | PRN
  Administered 2012-10-09: 10 mg via INTRAVENOUS
  Filled 2012-10-08: qty 2

## 2012-10-08 MED ORDER — GABAPENTIN 300 MG PO CAPS
300.0000 mg | ORAL_CAPSULE | Freq: Two times a day (BID) | ORAL | Status: DC
  Administered 2012-10-08 – 2012-10-13 (×10): 300 mg via ORAL
  Filled 2012-10-08 (×12): qty 1

## 2012-10-08 MED ORDER — BUPIVACAINE HCL 0.25 % IJ SOLN
INTRAMUSCULAR | Status: DC | PRN
  Administered 2012-10-08: 20 mL

## 2012-10-08 MED ORDER — METHOCARBAMOL 100 MG/ML IJ SOLN
500.0000 mg | Freq: Four times a day (QID) | INTRAMUSCULAR | Status: DC | PRN
  Filled 2012-10-08: qty 5

## 2012-10-08 MED ORDER — HYDROMORPHONE HCL PF 1 MG/ML IJ SOLN
0.5000 mg | INTRAMUSCULAR | Status: DC | PRN
  Administered 2012-10-08 (×2): 0.5 mg via INTRAVENOUS
  Administered 2012-10-09: 1 mg via INTRAVENOUS
  Filled 2012-10-08 (×2): qty 1

## 2012-10-08 SURGICAL SUPPLY — 59 items
BAG SPEC THK2 15X12 ZIP CLS (MISCELLANEOUS) ×1
BAG ZIPLOCK 12X15 (MISCELLANEOUS) ×2 IMPLANT
BANDAGE ELASTIC 6 VELCRO ST LF (GAUZE/BANDAGES/DRESSINGS) ×2 IMPLANT
BANDAGE ESMARK 6X9 LF (GAUZE/BANDAGES/DRESSINGS) ×1 IMPLANT
BLADE SAG 18X100X1.27 (BLADE) ×2 IMPLANT
BLADE SAW SGTL 11.0X1.19X90.0M (BLADE) ×2 IMPLANT
BNDG CMPR 9X6 STRL LF SNTH (GAUZE/BANDAGES/DRESSINGS) ×1
BNDG ESMARK 6X9 LF (GAUZE/BANDAGES/DRESSINGS) ×2
BOWL SMART MIX CTS (DISPOSABLE) ×2 IMPLANT
CAPT RP KNEE ×1 IMPLANT
CEMENT HV SMART SET (Cement) ×4 IMPLANT
CLOSURE STERI-STRIP 1/4X4 (GAUZE/BANDAGES/DRESSINGS) ×1 IMPLANT
CLOTH BEACON ORANGE TIMEOUT ST (SAFETY) ×2 IMPLANT
CUFF TOURN SGL QUICK 34 (TOURNIQUET CUFF) ×2
CUFF TRNQT CYL 34X4X40X1 (TOURNIQUET CUFF) ×1 IMPLANT
DECANTER SPIKE VIAL GLASS SM (MISCELLANEOUS) ×2 IMPLANT
DRAPE EXTREMITY T 121X128X90 (DRAPE) ×2 IMPLANT
DRAPE POUCH INSTRU U-SHP 10X18 (DRAPES) ×2 IMPLANT
DRAPE U-SHAPE 47X51 STRL (DRAPES) ×2 IMPLANT
DRSG ADAPTIC 3X8 NADH LF (GAUZE/BANDAGES/DRESSINGS) ×2 IMPLANT
DRSG PAD ABDOMINAL 8X10 ST (GAUZE/BANDAGES/DRESSINGS) ×2 IMPLANT
DURAPREP 26ML APPLICATOR (WOUND CARE) ×2 IMPLANT
ELECT REM PT RETURN 9FT ADLT (ELECTROSURGICAL) ×2
ELECTRODE REM PT RTRN 9FT ADLT (ELECTROSURGICAL) ×1 IMPLANT
EVACUATOR 1/8 PVC DRAIN (DRAIN) ×2 IMPLANT
FACESHIELD LNG OPTICON STERILE (SAFETY) ×10 IMPLANT
GLOVE BIO SURGEON STRL SZ7.5 (GLOVE) ×1 IMPLANT
GLOVE BIO SURGEON STRL SZ8 (GLOVE) ×2 IMPLANT
GLOVE BIOGEL PI IND STRL 8 (GLOVE) ×1 IMPLANT
GLOVE BIOGEL PI INDICATOR 8 (GLOVE) ×2
GLOVE ECLIPSE 6.5 STRL STRAW (GLOVE) ×2 IMPLANT
GLOVE SURG SS PI 6.5 STRL IVOR (GLOVE) ×3 IMPLANT
GOWN STRL NON-REIN LRG LVL3 (GOWN DISPOSABLE) ×3 IMPLANT
GOWN STRL REIN XL XLG (GOWN DISPOSABLE) ×3 IMPLANT
HANDPIECE INTERPULSE COAX TIP (DISPOSABLE) ×2
IMMOBILIZER KNEE 20 (SOFTGOODS) ×2
IMMOBILIZER KNEE 20 THIGH 36 (SOFTGOODS) ×1 IMPLANT
KIT BASIN OR (CUSTOM PROCEDURE TRAY) ×2 IMPLANT
MANIFOLD NEPTUNE II (INSTRUMENTS) ×2 IMPLANT
NDL SAFETY ECLIPSE 18X1.5 (NEEDLE) ×2 IMPLANT
NEEDLE HYPO 18GX1.5 SHARP (NEEDLE) ×4
NS IRRIG 1000ML POUR BTL (IV SOLUTION) ×2 IMPLANT
PACK TOTAL JOINT (CUSTOM PROCEDURE TRAY) ×2 IMPLANT
PADDING CAST COTTON 6X4 STRL (CAST SUPPLIES) ×6 IMPLANT
POSITIONER SURGICAL ARM (MISCELLANEOUS) ×2 IMPLANT
SET HNDPC FAN SPRY TIP SCT (DISPOSABLE) ×1 IMPLANT
SPONGE GAUZE 4X4 12PLY (GAUZE/BANDAGES/DRESSINGS) ×2 IMPLANT
STRIP CLOSURE SKIN 1/2X4 (GAUZE/BANDAGES/DRESSINGS) ×4 IMPLANT
SUCTION FRAZIER 12FR DISP (SUCTIONS) ×2 IMPLANT
SUT MNCRL AB 4-0 PS2 18 (SUTURE) ×2 IMPLANT
SUT VIC AB 2-0 CT1 27 (SUTURE) ×6
SUT VIC AB 2-0 CT1 TAPERPNT 27 (SUTURE) ×3 IMPLANT
SUT VLOC 180 0 24IN GS25 (SUTURE) ×2 IMPLANT
SYR 20CC LL (SYRINGE) ×2 IMPLANT
SYR 50ML LL SCALE MARK (SYRINGE) ×2 IMPLANT
TOWEL OR 17X26 10 PK STRL BLUE (TOWEL DISPOSABLE) ×4 IMPLANT
TRAY FOLEY CATH 14FRSI W/METER (CATHETERS) ×2 IMPLANT
WATER STERILE IRR 1500ML POUR (IV SOLUTION) ×2 IMPLANT
WRAP KNEE MAXI GEL POST OP (GAUZE/BANDAGES/DRESSINGS) ×2 IMPLANT

## 2012-10-08 NOTE — H&P (View-Only) (Signed)
[name] [name]  DOB: 07/13/1930 Undefined / Language: [place] / Race: White Female  Date of Admission:  10/08/2012  Chief Complaint:  Right Knee Pain  History of Present Illness The patient is a 79 year old female who comes in for a preoperative History and Physical. The patient is scheduled for a right total knee arthroplasty to be performed by Dr. Gus Rankin. Aluisio, MD at [facility] on 10/08/2012. The patient is a 79 year old female presenting for a post-operative visit. The patient is now out from her left total knee arthroplasty. The patient states that she is doing fair (has a sore area on the anterior medial side) at this time. The pain is under excellent control at this time and describe their pain as mild and mild to moderate. The patient is currently doing home exercise program. The patient feels that they are progressing well at this time. She said the left knee is doing very well at this time. She notes considerable improvement compared to preop. She still has a little soreness but nothing intolerable. The right knee is what is giving her the most problem. She has known advanced severe arthritis in the right knee. She is at a stage where she really wants to get that fixed. She has concerns given her difficult postop course on the other side but she is now ready to proceed with her right knee replacement. They have been treated conservatively in the past for the above stated problem and despite conservative measures, they continue to have progressive pain and severe functional limitations and dysfunction. They have failed non-operative management including home exercise, medications. It is felt that they would benefit from undergoing total joint replacement. Risks and benefits of the procedure have been discussed with the patient and they elect to proceed with surgery. There are no active contraindications to surgery such as ongoing infection or rapidly progressive  neurological disease.   Problem List S/P total knee replacement (V43.65) Primary osteoarthritis of one knee (715.16)   Allergies  Penicillin VK *PENICILLINS*. Rash. Morphine Sulfate *ANALGESICS - OPIOID*. Rash. Xarelto *ANTICOAGULANTS*. Gross Hematemisis   Family History  Siblings. Brother, Sister. cancer Cerebrovascular Accident. grandfather fathers side Mother. deceased age 82 due to complications from old age Father. deceased age at 12 due to complications from a stroke   Social History  Number of flights of stairs before winded. 2-3 Marital status. widowed Pain Contract. no Illicit drug use. no Exercise. Exercises weekly; does running / walking and other Post-Surgical Plans. Control and instrumentation engineer. Living Will, Healthcare POA Drug/Alcohol Rehab (Previously). no Drug/Alcohol Rehab (Currently). no Living situation. Lives alone. Tobacco use. never smoker Tobacco / smoke exposure. no Current work status. retired Copywriter, advertising. 2 Alcohol use. never consumed alcohol   Medication History  ALPRAZolam (0.5MG  Tablet, Oral three times daily, as needed) Active. Cholecalciferol (2000UNIT Tablet, Oral daily) Active. NexIUM (40MG  Capsule DR, Oral daily) Active. Ferrous Sulfate EC (325MG  Tablet DR, Oral two times daily) Active. Gabapentin (300MG  Capsule, two tablets Oral daily) Active. Hydrocodone-Acetaminophen (7.5-325MG  Tablet, Oral every 8 hours PRN) Active. Levothyroxine Sodium ( Tablet, Oral daily) Active. Multivitamins ( Oral daily) Active. Oxaprozin (600MG  Tablet, Oral two times daily) Active. Premarin (0.625MG /GM Cream, Vaginal) Active. Simvastatin (20MG  Tablet, Oral daily) Active. Tolterodine Tartrate (2MG  Tablet, two tablets Oral daily) Active. Vitamin C Plus (500MG  Tablet, Oral daily) Active. Zinc (50MG  Tablet, Oral) Active. Aspirin Childrens (81MG  Tablet Chewable, Oral) Active.   Past Surgical History Bronchogenic Cyst  removed 2007 Appendectomy. as a child  Tubal Ligation Gallbladder Surgery. laporoscopic 2007 Rotator Cuff Repair. bilateral 1992, 1993 Total Knee Replacement - Left  Medical History Rheumatoid Arthritis Cataract Vertigo Varicose veins Bronchitis Hypercholesterolemia Atrial Fibrillation. Patient had new onset episode the last time following her Left Total Knee Replacement. Gastroesophageal Reflux Disease Hemorrhoids Urinary Tract Infection Urinary Incontinence Hypothyroidism Measles. childhood Mumps. childhood   Review of Systems  General:Not Present- Chills, Fever, Night Sweats, Fatigue, Weight Gain, Weight Loss and Memory Loss. Skin:Not Present- Hives, Itching, Rash, Eczema and Lesions. HEENT:Not Present- Tinnitus, Headache, Double Vision, Visual Loss, Hearing Loss and Dentures. Respiratory:Present- Shortness of breath with exertion, Cough and Wheezing. Not Present- Shortness of breath at rest, Allergies, Coughing up blood and Chronic Cough. Cardiovascular:Present- Leg Pain and/or Swelling. Not Present- Chest Pain, Racing/skipping heartbeats, Difficulty Breathing Lying Down, Murmur, Swelling and Palpitations. Gastrointestinal:Present- Heartburn, Abdominal Pain and Constipation. Not Present- Bloody Stool, Vomiting, Nausea, Diarrhea, Difficulty Swallowing, Jaundice and Loss of appetitie. Female Genitourinary:Present- Urinary frequency, Incontinence and Urinating at Night. Not Present- Blood in Urine, Weak urinary stream, Discharge, Flank Pain, Painful Urination, Urgency and Urinary Retention. Musculoskeletal:Present- Joint Swelling, Joint Pain, Back Pain and Morning Stiffness. Not Present- Muscle Weakness, Muscle Pain and Spasms. Neurological:Not Present- Tremor, Dizziness, Blackout spells, Paralysis, Difficulty with balance and Weakness. Psychiatric:Present- Insomnia.   Vitals Weight: 167 lb Height: 65.5 in Weight was reported by patient. Height was reported  by patient. Body Surface Area: 1.87 m Body Mass Index: 27.37 kg/m Pulse: 60 (Regular) Resp.: 14 (Unlabored) BP: 138/68 (Sitting, Right Arm, Standard)    Physical Exam  The physical exam findings are as follows:  Note: Patient is a 79 year old female with continued knee pain. Patient is accompanied today by her sister at the H&P.   General Mental Status - Alert, cooperative and good historian. General Appearance- pleasant and Anxious. Not in acute distress. Orientation- Oriented X3. Build & Nutrition- Overweight, Well nourished and Well developed.   Head and Neck Head- normocephalic, atraumatic . Neck Global Assessment- supple. no bruit auscultated on the right and no bruit auscultated on the left.   Eye Pupil- Bilateral- Normal. Motion- Bilateral- EOMI.   Chest and Lung Exam Auscultation: Breath sounds:- clear at anterior chest wall and - clear at posterior chest wall. Adventitious sounds:- No Adventitious sounds.   Cardiovascular Auscultation:Rhythm- Regular rate and rhythm. Heart Sounds- S1 WNL and S2 WNL. Murmurs & Other Heart Sounds: Murmur 1:Location- Aortic Area. Timing- Mid-systolic. Grade- III/VI. Character- Crescendo/Decrescendo.   Abdomen Inspection:Contour- Generalized mild distention. Palpation/Percussion:Tenderness- Abdomen is non-tender to palpation. Rigidity (guarding)- Abdomen is soft. Auscultation:Auscultation of the abdomen reveals - Bowel sounds normal.   Female Genitourinary  Not done, not pertinent to present illness  Peripheral Vascular Upper Extremity: Palpation:- Pulses bilaterally normal. Lower Extremity: Palpation:- Pulses bilaterally normal.   Neurologic Examination of related systems reveals - normal muscle strength and tone in all extremities. Neurologic evaluation reveals - normal sensation and upper and lower extremity deep tendon reflexes intact bilaterally  .   Musculoskeletal On exam she is in no distress. Her left knee looks fine. Range of motion 0 to 115 degrees. There is no tenderness or instability on the left. Right knee severe varus deformity, range about 5 to 115 with marked crepitus on range of motion. Some pseudolaxity with valgus stressing and no gross instability noted.  RADIOGRAPHS: She is bone on bone in the medial and patellofemoral compartments. She has about a 5 degree varus on the right knee.  Assessment & Plan  Primary osteoarthritis of one knee (  715.16) Impression: Right Knee  Note: Plan is for a Right Total Knee Replacement by Dr. Lequita Halt.  Plan is to [facility]. She lives alone.  The patient will not receive TXA (tranexamic acid) due to: Peripheral Arterial Disease  Signed electronically by Lauraine Rinne, III PA-C

## 2012-10-08 NOTE — Interval H&P Note (Signed)
History and Physical Interval Note:  10/08/2012 9:48 AM  [name] [name]  has presented today for surgery, with the diagnosis of OA OF RIGHT KNEE  The various methods of treatment have been discussed with the patient and family. After consideration of risks, benefits and other options for treatment, the patient has consented to  Procedure(s): RIGHT TOTAL KNEE ARTHROPLASTY (Right) as a surgical intervention .  The patient's history has been reviewed, patient examined, no change in status, stable for surgery.  I have reviewed the patient's chart and labs.  Questions were answered to the patient's satisfaction.     Loanne Drilling

## 2012-10-08 NOTE — Op Note (Signed)
Pre-operative diagnosis- Osteoarthritis  Right knee(s)  Post-operative diagnosis- Osteoarthritis Right knee(s)  Procedure-  Right  Total Knee Arthroplasty  Surgeon- Gus Rankin. Jessly Lebeck, MD  Assistant- Dimitri Ped, PA-C   Anesthesia-  General EBL-* No blood loss amount entered *  Drains Hemovaac  Tourniquet time-  Total Tourniquet Time Documented: Thigh (Right) - 32 minutes Total: Thigh (Right) - 32 minutes    Complications- None  Condition-PACU - hemodynamically stable.   Brief Clinical Note  [name] [name] is a 79 y.o. year old female with end stage OA of her right knee with progressively worsening pain and dysfunction. She has constant pain, with activity and at rest and significant functional deficits with difficulties even with ADLs. She has had extensive non-op management including analgesics, injections of cortisone and viscosupplements, and home exercise program, but remains in significant pain with significant dysfunction.Radiographs show bone on bone arthritis all 3 compartments. She presents now for right Total Knee Arthroplasty.    Procedure in detail---   The patient is brought into the operating room and positioned supine on the operating table. After successful administration of  General,   a tourniquet is placed high on the  Right thigh(s) and the lower extremity is prepped and draped in the usual sterile fashion. Time out is performed by the operating team and then the  Right lower extremity is wrapped in Esmarch, knee flexed and the tourniquet inflated to 300 mmHg.       A midline incision is made with a ten blade through the subcutaneous tissue to the level of the extensor mechanism. A fresh blade is used to make a medial parapatellar arthrotomy. Soft tissue over the proximal medial tibia is subperiosteally elevated to the joint line with a knife and into the semimembranosus bursa with a Cobb elevator. Soft tissue over the proximal lateral tibia is elevated with  attention being paid to avoiding the patellar tendon on the tibial tubercle. The patella is everted, knee flexed 90 degrees and the ACL and PCL are removed. Findings are bone on bone all 3 compartments with large global osteophytes.        The drill is used to create a starting hole in the distal femur and the canal is thoroughly irrigated with sterile saline to remove the fatty contents. The 5 degree Right  valgus alignment guide is placed into the femoral canal and the distal femoral cutting block is pinned to remove 10 mm off the distal femur. Resection is made with an oscillating saw.      The tibia is subluxed forward and the menisci are removed. The extramedullary alignment guide is placed referencing proximally at the medial aspect of the tibial tubercle and distally along the second metatarsal axis and tibial crest. The block is pinned to remove 2mm off the more deficient medial  side. Resection is made with an oscillating saw. Size 3is the most appropriate size for the tibia and the proximal tibia is prepared with the modular drill and keel punch for that size.      The femoral sizing guide is placed and size 3 is most appropriate. Rotation is marked off the epicondylar axis and confirmed by creating a rectangular flexion gap at 90 degrees. The size 3 cutting block is pinned in this rotation and the anterior, posterior and chamfer cuts are made with the oscillating saw. The intercondylar block is then placed and that cut is made.      Trial size 3 tibial component, trial size 3 posterior  stabilized femur and a 12.5  mm posterior stabilized rotating platform insert trial is placed. Full extension is achieved with excellent varus/valgus and anterior/posterior balance throughout full range of motion. The patella is everted and thickness measured to be 22  mm. Free hand resection is taken to 12 mm, a 38 template is placed, lug holes are drilled, trial patella is placed, and it tracks normally. Osteophytes  are removed off the posterior femur with the trial in place. All trials are removed and the cut bone surfaces prepared with pulsatile lavage. Cement is mixed and once ready for implantation, the size 3 tibial implant, size  3 posterior stabilized femoral component, and the size 38 patella are cemented in place and the patella is held with the clamp. The trial insert is placed and the knee held in full extension. The Exparel (20 ml mixed with 30 ml saline) and .25% Bupivicaine, are injected into the extensor mechanism, posterior capsule, medial and lateral gutters and subcutaneous tissues.  All extruded cement is removed and once the cement is hard the permanent 12.5 mm posterior stabilized rotating platform insert is placed into the tibial tray.      The wound is copiously irrigated with saline solution and the extensor mechanism closed over a hemovac drain with #1 PDS suture. The tourniquet is released for a total tourniquet time of 32  minutes. Flexion against gravity is 140 degrees and the patella tracks normally. Subcutaneous tissue is closed with 2.0 vicryl and subcuticular with running 4.0 Monocryl. The incision is cleaned and dried and steri-strips and a bulky sterile dressing are applied. The limb is placed into a knee immobilizer and the patient is awakened and transported to recovery in stable condition.      Please note that a surgical assistant was a medical necessity for this procedure in order to perform it in a safe and expeditious manner. Surgical assistant was necessary to retract the ligaments and vital neurovascular structures to prevent injury to them and also necessary for proper positioning of the limb to allow for anatomic placement of the prosthesis.   Gus Rankin [name] Mccollom, MD    10/08/2012, 12:59 PM

## 2012-10-08 NOTE — Anesthesia Postprocedure Evaluation (Signed)
Anesthesia Post Note  Patient: [name] [name]  Procedure(s) Performed: Procedure(s) (LRB): RIGHT TOTAL KNEE ARTHROPLASTY (Right)  Anesthesia type: General  Patient location: PACU  Post pain: Pain level controlled  Post assessment: Post-op Vital signs reviewed  Last Vitals:  Filed Vitals:   10/08/12 1317  BP: 126/76  Pulse: 86  Temp: 36.4 C  Resp: 11    Post vital signs: Reviewed  Level of consciousness: sedated  Complications: No apparent anesthesia complications

## 2012-10-08 NOTE — Preoperative (Signed)
Beta Blockers   Reason not to administer Beta Blockers:Not Applicable, not on home BB 

## 2012-10-08 NOTE — Anesthesia Preprocedure Evaluation (Addendum)
Anesthesia Evaluation  Patient identified by MRN, date of birth, ID band Patient awake  General Assessment Comment:Advanced yrs  Reviewed: Allergy & Precautions, H&P , NPO status , Patient's Chart, lab work & pertinent test results, reviewed documented beta blocker date and time   Airway Mallampati: II TM Distance: >3 FB Neck ROM: Full    Dental  (+) Dental Advisory Given   Pulmonary neg pulmonary ROS,  S/p thoracotomy for lung cyst-left breath sounds clear to auscultation        Cardiovascular hypertension, Pt. on medications + Peripheral Vascular Disease negative cardio ROS  + dysrhythmias Atrial Fibrillation Rhythm:Regular Rate:Normal  Denies cardiac symptom HTN on chart, pt denies   Neuro/Psych negative neurological ROS  negative psych ROS   GI/Hepatic negative GI ROS, Neg liver ROS, GERD-  ,  Endo/Other  negative endocrine ROSHypothyroidism   Renal/GU negative Renal ROS  negative genitourinary   Musculoskeletal negative musculoskeletal ROS (+) Arthritis -, Osteoarthritis,    Abdominal   Peds negative pediatric ROS (+)  Hematology negative hematology ROS (+)   Anesthesia Other Findings Caps on side  Reproductive/Obstetrics negative OB ROS                          Anesthesia Physical  Anesthesia Plan  ASA: III  Anesthesia Plan: General   Post-op Pain Management:    Induction: Intravenous  Airway Management Planned: Oral ETT  Additional Equipment:   Intra-op Plan:   Post-operative Plan: Extubation in OR  Informed Consent: I have reviewed the patients History and Physical, chart, labs and discussed the procedure including the risks, benefits and alternatives for the proposed anesthesia with the patient or authorized representative who has indicated his/her understanding and acceptance.   Dental advisory given  Plan Discussed with: CRNA  Anesthesia Plan Comments:          Anesthesia Quick Evaluation

## 2012-10-08 NOTE — Transfer of Care (Signed)
Immediate Anesthesia Transfer of Care Note  Patient: [name] [name]  Procedure(s) Performed: Procedure(s): RIGHT TOTAL KNEE ARTHROPLASTY (Right)  Patient Location: PACU  Anesthesia Type:General  Level of Consciousness: awake, oriented, patient cooperative and responds to stimulation  Airway & Oxygen Therapy: Patient Spontanous Breathing and Patient connected to face mask  Post-op Assessment: Report given to PACU RN, Post -op Vital signs reviewed and stable and Patient moving all extremities X 4  Post vital signs: Reviewed and stable  Complications: No apparent anesthesia complications

## 2012-10-09 ENCOUNTER — Encounter (HOSPITAL_COMMUNITY): Payer: Self-pay | Admitting: Orthopedic Surgery

## 2012-10-09 DIAGNOSIS — E871 Hypo-osmolality and hyponatremia: Secondary | ICD-10-CM | POA: Diagnosis not present

## 2012-10-09 DIAGNOSIS — D62 Acute posthemorrhagic anemia: Secondary | ICD-10-CM | POA: Diagnosis not present

## 2012-10-09 LAB — CBC
HCT: 25.5 % — ABNORMAL LOW (ref 36.0–46.0)
Hemoglobin: 8.3 g/dL — ABNORMAL LOW (ref 12.0–15.0)
RBC: 2.88 MIL/uL — ABNORMAL LOW (ref 3.87–5.11)

## 2012-10-09 LAB — BASIC METABOLIC PANEL
BUN: 15 mg/dL (ref 6–23)
CO2: 27 mEq/L (ref 19–32)
Glucose, Bld: 160 mg/dL — ABNORMAL HIGH (ref 70–99)
Potassium: 4 mEq/L (ref 3.5–5.1)
Sodium: 130 mEq/L — ABNORMAL LOW (ref 135–145)

## 2012-10-09 NOTE — Progress Notes (Signed)
OT Cancellation Note  Patient Details Name: DIORA BELLIZZI MRN: [id number] DOB: 1930-06-26   Cancelled Treatment:    Reason Eval/Treat Not Completed: Other (comment)  Pt plans snf for rehab; will defer OT to snf.    Zaccai Chavarin 10/09/2012, 12:02 PM Marica Otter, OTR/L [phone] 10/09/2012

## 2012-10-09 NOTE — Care Management Note (Addendum)
    Page 1 of 1   10/11/2012     11:54:06 AM   CARE MANAGEMENT NOTE 10/11/2012  Patient:  [name] [name], [name] [name]   Account Number:  [id number]  Date Initiated:  10/09/2012  Documentation initiated by:  Colleen Can  Subjective/Objective Assessment:   dx OA rt knee; total knee replacemnt     Action/Plan:   SNF rehab   Anticipated DC Date:  10/12/2012   Anticipated DC Plan:  SKILLED NURSING FACILITY  In-house referral  Clinical Social Worker      DC Planning Services  CM consult      Choice offered to / List presented to:             Status of service:  Completed, signed off Medicare Important Message given?  NA - LOS <3 / Initial given by admissions (If response is "NO", the following Medicare IM given date fields will be blank) Date Medicare IM given:   Date Additional Medicare IM given:    Discharge Disposition:    Per UR Regulation:  Reviewed for med. necessity/level of care/duration of stay  If discussed at Long Length of Stay Meetings, dates discussed:    Comments:  10/11/12 [name] Meany RN,BSN NCM [phone] POD#3 R TKA.BACK TO OR TODAY FOR R KNEE WASHOUT.D/C PLAN SNF.SW ALREADY FOLLOWING.

## 2012-10-09 NOTE — Evaluation (Signed)
Physical Therapy Evaluation Patient Details Name: [name] [name] MRN: [id number] DOB: 20-Oct-1930 Today's Date: 10/09/2012 Time: 8469-6295 PT Time Calculation (min): 17 min  PT Assessment / Plan / Recommendation History of Present Illness  s/p RTKA on 10/08/12.  Clinical Impression  Pt has had difficulty with nausea and pain  Control earlier. Pt tolerated ambulating x 32 ' and did well. No nausea. Pt plans for SNF. Pt will benefit from PT while in acute care.    PT Assessment  Patient needs continued PT services    Follow Up Recommendations  SNF    Does the patient have the potential to tolerate intense rehabilitation      Barriers to Discharge Decreased caregiver support      Equipment Recommendations  None recommended by PT    Recommendations for Other Services     Frequency 7X/week    Precautions / Restrictions Precautions Precautions: Knee Required Braces or Orthoses: Knee Immobilizer - Right   Pertinent Vitals/Pain 4-5 R knee with weight.      Mobility  Bed Mobility Bed Mobility: Supine to Sit Supine to Sit: 3: Mod assist;With rails;HOB elevated Transfers Transfers: Sit to Stand;Stand to Sit Sit to Stand: 1: +2 Total assist;From bed;With upper extremity assist Sit to Stand: Patient Percentage: 60% Stand to Sit: 4: Min assist;To chair/3-in-1;With upper extremity assist;With armrests Details for Transfer Assistance: cues for RLE position, reach back to armrests. Ambulation/Gait Ambulation/Gait Assistance: 1: +2 Total assist Ambulation/Gait: Patient Percentage: 70% Ambulation Distance (Feet): 32 Feet Assistive device: Rolling walker Ambulation/Gait Assistance Details: cues for sequence. Gait Pattern: Step-to pattern;Antalgic;Trunk flexed    Exercises     PT Diagnosis: Difficulty walking;Acute pain  PT Problem List: Decreased strength;Decreased range of motion;Decreased activity tolerance;Decreased mobility;Decreased knowledge of use of DME;Decreased safety  awareness;Decreased knowledge of precautions;Pain PT Treatment Interventions: DME instruction;Gait training;Functional mobility training;Therapeutic activities;Therapeutic exercise;Patient/family education     PT Goals(Current goals can be found in the care plan section) Acute Rehab PT Goals Patient Stated Goal: I want to do what I can for myself. PT Goal Formulation: With patient Time For Goal Achievement: 10/16/12 Potential to Achieve Goals: Good  Visit Information  Last PT Received On: 10/09/12 Assistance Needed: +2 History of Present Illness: s/p RTKA on 10/08/12.       Prior Functioning  Home Living Family/patient expects to be discharged to:: Skilled nursing facility Living Arrangements: Alone    Cognition  Cognition Arousal/Alertness: Lethargic Behavior During Therapy: WFL for tasks assessed/performed Overall Cognitive Status: Within Functional Limits for tasks assessed    Extremity/Trunk Assessment Upper Extremity Assessment Upper Extremity Assessment: Overall WFL for tasks assessed Lower Extremity Assessment Lower Extremity Assessment: RLE deficits/detail RLE Deficits / Details: raises leg with min assist.    Balance    End of Session PT - End of Session Activity Tolerance: Patient tolerated treatment well Patient left: in chair;with call bell/phone within reach Nurse Communication: Mobility status  GP     Rada Hay 10/09/2012, 2:13 PM  Blanchard Kelch PT [phone]

## 2012-10-09 NOTE — Progress Notes (Signed)
   Subjective: 1 Day Post-Op Procedure(s) (LRB): RIGHT TOTAL KNEE ARTHROPLASTY (Right) Patient reports pain as moderate and and also sickness last night..   Patient seen in rounds with Dr. Lequita Halt. Patient is having problems with nausea/vomiting and pain in the knee, requiring pain medications We will start therapy today.  Plan is to go Skilled nursing facility after hospital stay.  Objective: Vital signs in last 24 hours: Temp:  [97.6 F (36.4 C)-98.6 F (37 C)] 98.4 F (36.9 C) (07/15 0445) Pulse Rate:  [65-86] 80 (07/15 0445) Resp:  [11-18] 16 (07/15 0445) BP: (93-137)/(53-82) 130/68 mmHg (07/15 0445) SpO2:  [93 %-99 %] 98 % (07/15 0445) Weight:  [76.658 kg (169 lb)] 76.658 kg (169 lb) (07/14 1330)  Intake/Output from previous day:  Intake/Output Summary (Last 24 hours) at 10/09/12 0855 Last data filed at 10/09/12 0600  Gross per 24 hour  Intake   2935 ml  Output 1482.5 ml  Net 1452.5 ml    Intake/Output this shift:    Labs:  Recent Labs  10/09/12 0450  HGB 8.3*    Recent Labs  10/09/12 0450  WBC 25.7*  RBC 2.88*  HCT 25.5*  PLT 167    Recent Labs  10/09/12 0450  NA 130*  K 4.0  CL 96  CO2 27  BUN 15  CREATININE 0.80  GLUCOSE 160*  CALCIUM 8.3*   No results found for this basename: LABPT, INR,  in the last 72 hours  EXAM General - Patient is Alert, Appropriate and Oriented Extremity - Neurovascular intact Sensation intact distally Dorsiflexion/Plantar flexion intact Dressing - dressing C/D/I Motor Function - intact, moving foot and toes well on exam.  Hemovac pulled without difficulty.  Past Medical History  Diagnosis Date  . Mixed hyperlipidemia   . Hypothyroidism   . GERD (gastroesophageal reflux disease)   . Atrial fibrillation     Remote amiodarone use, recurred in 07/2011 post op from knee arthroplasty  . Obese   . Asymptomatic cholelithiasis   . History of blood transfusion 08/31/2011    A+ type  . History of GI bleed    Gastritis, was on Xarelto and ASA  . Carpal tunnel syndrome   . Arthritis     Knees  . DJD (degenerative joint disease), thoracolumbar   . Rheumatoid arthritis   . H/O bronchitis   . Hemorrhoids   . Urinary incontinence   . Measles     as child  . Mumps     as child  . Carotid artery disease     Assessment/Plan: 1 Day Post-Op Procedure(s) (LRB): RIGHT TOTAL KNEE ARTHROPLASTY (Right) Principal Problem:   OA (osteoarthritis) of knee Active Problems:   Hyponatremia   Postoperative anemia due to acute blood loss  Estimated body mass index is 28.12 kg/(m^2) as calculated from the following:   Height as of this encounter: 5\' 5"  (1.651 m).   Weight as of this encounter: 76.658 kg (169 lb). Up with therapy Plan for discharge tomorrow Discharge home with home health  DVT Prophylaxis - Lovenox Weight-Bearing as tolerated to right leg No vaccines. D/C O2 and Pulse OX and try on Room [address]  [name] [name] 10/09/2012, 8:55 AM

## 2012-10-09 NOTE — Progress Notes (Signed)
Physical Therapy Treatment Patient Details Name: [name] [name] MRN: [id number] DOB: 1930-08-05 Today's Date: 10/09/2012 Time: 2951-8841 PT Time Calculation (min): 23 min  PT Assessment / Plan / Recommendation  PT Comments   Pt continues to be very drowsy but puts forth effort. No nausea, pain is controlled.  Follow Up Recommendations  SNF     Does the patient have the potential to tolerate intense rehabilitation     Barriers to Discharge Decreased caregiver support      Equipment Recommendations  None recommended by PT    Recommendations for Other Services    Frequency 7X/week   Progress towards PT Goals Progress towards PT goals: Progressing toward goals  Plan      Precautions / Restrictions Precautions Precautions: Knee Required Braces or Orthoses: Knee Immobilizer - Right   Pertinent Vitals/Pain     Mobility  Bed Mobility Bed Mobility: Sit to Supine Supine to Sit: 3: Mod assist;With rails;HOB elevated Sit to Supine: 3: Mod assist;1: +1 Total assist Details for Bed Mobility Assistance: assist RLE onto bed. Transfers Transfers: Sit to Stand;Stand to Sit Sit to Stand: 3: Mod assist;From chair/3-in-1;With armrests;With upper extremity assist Sit to Stand: Patient Percentage: 60% Stand to Sit: To bed;With upper extremity assist Details for Transfer Assistance: cues for RLE position, reach back to armrests.some lifting assist from recliner. Ambulation/Gait Ambulation/Gait Assistance: 3: Mod assist Ambulation/Gait: Patient Percentage: 70% Ambulation Distance (Feet): 5 Feet Assistive device: Rolling walker Ambulation/Gait Assistance Details: to back up to bed , difficulty with weight onR leg Gait Pattern: Step-to pattern;Antalgic;Trunk flexed    Exercises Total Joint Exercises Quad Sets: AROM;Right;10 reps Heel Slides: AAROM;Right;10 reps Straight Leg Raises: AAROM;Right;10 reps   PT Diagnosis: Difficulty walking;Acute pain  PT Problem List: Decreased  strength;Decreased range of motion;Decreased activity tolerance;Decreased mobility;Decreased knowledge of use of DME;Decreased safety awareness;Decreased knowledge of precautions;Pain PT Treatment Interventions: DME instruction;Gait training;Functional mobility training;Therapeutic activities;Therapeutic exercise;Patient/family education   PT Goals (current goals can now be found in the care plan section) Acute Rehab PT Goals Patient Stated Goal: I want to do what I can for myself. PT Goal Formulation: With patient Time For Goal Achievement: 10/16/12 Potential to Achieve Goals: Good  Visit Information  Last PT Received On: 10/09/12 Assistance Needed: +2 History of Present Illness: s/p RTKA on 10/08/12.    Subjective Data  Patient Stated Goal: I want to do what I can for myself.   Cognition  Cognition Arousal/Alertness: Lethargic Behavior During Therapy: WFL for tasks assessed/performed Overall Cognitive Status: Within Functional Limits for tasks assessed    Balance     End of Session PT - End of Session Activity Tolerance: Patient limited by fatigue Patient left: in bed;with call bell/phone within reach Nurse Communication: Mobility status   GP     Rada Hay 10/09/2012, 4:00 PM

## 2012-10-09 NOTE — Progress Notes (Signed)
Clinical Social Work Department BRIEF PSYCHOSOCIAL ASSESSMENT 10/09/2012  Patient:  [name] [name], [name] [name]     Account Number:  [id number]     Admit date:  10/08/2012  Clinical Social Worker:  Candie Chroman  Date/Time:  10/09/2012 08:10 AM  Referred by:  Physician  Date Referred:  10/09/2012 Referred for  SNF Placement   Other Referral:   Interview type:  Patient Other interview type:    PSYCHOSOCIAL DATA Living Status:  ALONE Admitted from facility:   Level of care:   Primary support name:  Malva Cogan Primary support relationship to patient:  CHILD, ADULT Degree of support available:   supportive    CURRENT CONCERNS Current Concerns  Post-Acute Placement   Other Concerns:    SOCIAL WORK ASSESSMENT / PLAN Pt is an 79 yr old female living at home prior to hospitalization. CSW met with pt to assist with d/c planning. Pt has made prior arrangements to have ST Rehab at [facility] following hospital d/c. CSW has contacted SNF and d/c plans have been confirmed. CSW will continue to follow to assist with d/c planning needs.   Assessment/plan status:  Psychosocial Support/Ongoing Assessment of Needs Other assessment/ plan:   Information/referral to community resources:   none needed at this time    PATIENT'S/FAMILY'S RESPONSE TO PLAN OF CARE: Pt is looking forward to having rehab at [facility].   Cori Razor LCSW [phone]

## 2012-10-09 NOTE — Progress Notes (Signed)
Clinical Social Work Department CLINICAL SOCIAL WORK PLACEMENT NOTE 10/09/2012  Patient:  [name] [name], [name] [name]  Account Number:  [id number] Admit date:  10/08/2012  Clinical Social Worker:  Cori Razor, LCSW  Date/time:  10/09/2012 08:15 AM  Clinical Social Work is seeking post-discharge placement for this patient at the following level of care:   SKILLED NURSING   (*CSW will update this form in Epic as items are completed)     Patient/family provided with [facility] Department of Clinical Social Work's list of facilities offering this level of care within the geographic area requested by the patient (or if unable, by the patient's family).  10/09/2012  Patient/family informed of their freedom to choose among providers that offer the needed level of care, that participate in Medicare, Medicaid or managed care program needed by the patient, have an available bed and are willing to accept the patient.    Patient/family informed of [facility]' ownership interest in [facility], as well as of the fact that they are under no obligation to receive care at this facility.  PASARR submitted to EDS on  PASARR number received from EDS on 08/11/2011  FL2 transmitted to all facilities in geographic area requested by pt/family on  10/09/2012 FL2 transmitted to all facilities within larger geographic area on   Patient informed that his/her managed care company has contracts with or will negotiate with  certain facilities, including the following:     Patient/family informed of bed offers received:  10/09/2012 Patient chooses bed at [facility] Physician recommends and patient chooses bed at    Patient to be transferred to [facility] on   Patient to be transferred to facility by   The following physician request were entered in Epic:   Additional Comments:  Cori Razor LCSW [phone]

## 2012-10-09 NOTE — Clinical Documentation Improvement (Signed)
THIS DOCUMENT IS NOT A PERMANENT PART OF THE MEDICAL RECORD  Please update your documentation with the medical record to reflect your response to this query. If you need help knowing how to do this please call [phone].  10/09/12   Dear Dr. Patrica Duel, PA-C / Associates,  In a better effort to capture your patient's severity of illness, reflect appropriate length of stay and utilization of resources, a review of the patient medical record has revealed the following indicators.    Based on your clinical judgment, please clarify and document in a progress note and/or discharge summary the clinical condition associated with the following supporting information:  In responding to this query please exercise your independent judgment.  The fact that a query is asked, does not imply that any particular answer is desired or expected.  Pt with abnormal UA and Urine Culture:  Clarification Needed    Please clarify the underlying diagnosis responsible for the abnormal UA/Urine culture and document in pn or d/c summary     Possible Clinical Conditions?  ____________________  ____________________ ____________________ _______Other Condition__________________ _______Cannot Clinically Determine   Supporting Information:  Risk Factors: Signs & Symptoms OA RA Hyponatremia ABLA N/V H/O UTI  Diagnostics: Component     Latest Ref Rng 10/02/2012        11:45 AM  Squamous Epithelial / LPF     RARE FEW (A)  WBC, UA     <3 WBC/hpf TOO NUMEROUS TO COUNT  Bacteria, UA     RARE FEW (A)    Component     Latest Ref Rng 10/02/2012        11:45 AM  Specimen Description      URINE, CLEAN CATCH  Special Requests      NONE  Culture  Setup Time      10/02/2012 22:22  Colony Count      >=100,000 COLONIES/ML  Culture      PROTEUS VULGARIS . . .  Report Status      10/05/2012 FINAL  Organism ID, Bacteria      PROTEUS VULGARIS   :    Treatment Monitoring  You may  use possible, probable, or suspect with inpatient documentation. possible, probable, suspected diagnoses MUST be documented at the time of discharge  Reviewed:  no additional documentation provided ljh  Thank You,  Enis Slipper  RN, BSN, MSN/Inf, CCDS Clinical Documentation Specialist [facility] HIM Dept Pager: [phone] / E-mail: [email or web address]  [phone] Health Information Management [place]

## 2012-10-10 DIAGNOSIS — Z9289 Personal history of other medical treatment: Secondary | ICD-10-CM

## 2012-10-10 LAB — BASIC METABOLIC PANEL
BUN: 11 mg/dL (ref 6–23)
CO2: 26 mEq/L (ref 19–32)
Calcium: 8.4 mg/dL (ref 8.4–10.5)
Creatinine, Ser: 0.65 mg/dL (ref 0.50–1.10)
Glucose, Bld: 126 mg/dL — ABNORMAL HIGH (ref 70–99)

## 2012-10-10 LAB — CBC
HCT: 19.3 % — ABNORMAL LOW (ref 36.0–46.0)
Hemoglobin: 6.6 g/dL — CL (ref 12.0–15.0)
MCH: 29.7 pg (ref 26.0–34.0)
MCHC: 34.2 g/dL (ref 30.0–36.0)
MCV: 86.9 fL (ref 78.0–100.0)
RBC: 2.22 MIL/uL — ABNORMAL LOW (ref 3.87–5.11)

## 2012-10-10 MED ORDER — DIPHENHYDRAMINE HCL 25 MG PO CAPS
25.0000 mg | ORAL_CAPSULE | Freq: Once | ORAL | Status: AC
  Administered 2012-10-10: 25 mg via ORAL
  Filled 2012-10-10: qty 1

## 2012-10-10 MED ORDER — ACETAMINOPHEN 325 MG PO TABS
650.0000 mg | ORAL_TABLET | Freq: Once | ORAL | Status: AC
  Administered 2012-10-10: 650 mg via ORAL
  Filled 2012-10-10: qty 2

## 2012-10-10 MED ORDER — ASPIRIN EC 325 MG PO TBEC
325.0000 mg | DELAYED_RELEASE_TABLET | Freq: Two times a day (BID) | ORAL | Status: DC
  Administered 2012-10-10 – 2012-10-13 (×6): 325 mg via ORAL
  Filled 2012-10-10 (×9): qty 1

## 2012-10-10 MED ORDER — METOPROLOL TARTRATE 1 MG/ML IV SOLN
5.0000 mg | Freq: Once | INTRAVENOUS | Status: AC
  Administered 2012-10-10: 5 mg via INTRAVENOUS
  Filled 2012-10-10: qty 5

## 2012-10-10 MED ORDER — FUROSEMIDE 10 MG/ML IJ SOLN
10.0000 mg | Freq: Once | INTRAMUSCULAR | Status: AC
  Administered 2012-10-10: 10 mg via INTRAVENOUS
  Filled 2012-10-10 (×2): qty 1

## 2012-10-10 NOTE — Progress Notes (Signed)
CRITICAL VALUE ALERT  Critical value received:  HGB 6.6  Date of notification:  10/10/12  Time of notification:  0529  Critical value read back: yes  Nurse who received alert:  Aquilla Hacker  MD notified (1st page):  Ralene Bathe  Time of first page: 0530  MD notified (2nd page):  Time of second page:  Responding MD:   Time MD responded:  [phone]

## 2012-10-10 NOTE — Progress Notes (Signed)
Physical Therapy Treatment Patient Details Name: [name] [name] MRN: [id number] DOB: 20-Jan-1931 Today's Date: 10/10/2012 Time: 1914-7829 PT Time Calculation (min): 10 min  PT Assessment / Plan / Recommendation  PT Comments   Pt is weak, HR incr. 121. BP 180/80, RN in   Follow Up Recommendations  SNF     Does the patient have the potential to tolerate intense rehabilitation     Barriers to Discharge        Equipment Recommendations       Recommendations for Other Services    Frequency 7X/week   Progress towards PT Goals Progress towards PT goals: Progressing toward goals  Plan      Precautions / Restrictions Precautions Precautions: Knee Precaution Comments: monitor HR,Sats, Required Braces or Orthoses: Knee Immobilizer - Right   Pertinent Vitals/Pain See clinical impr/    Mobility  Bed Mobility iSit to Supine: 3: Mod assist Details for Bed Mobility Assistance: assist RLE onto bed. Transfers Transfers: Stand Pivot Transfers Sit to Stand: 3: Mod assist Sit to Stand: Patient Percentage: 60% Stand to Sit: 3: Mod assist Stand Pivot Transfers: 1: +2 Total assist Stand Pivot Transfers: Patient Percentage: 60% Details for Transfer Assistance: difficulty taking steps backward towards bed. Ambulation/Gait Ambulation/Gait Assistance: Not tested (comment)    Exercises  not performed due to medical issues, noted blisters L knee.   PT Diagnosis:    PT Problem List:   PT Treatment Interventions:     PT Goals (current goals can now be found in the care plan section)    Visit Information  Last PT Received On: 10/10/12 Assistance Needed: +2    Subjective Data      Cognition  Cognition Arousal/Alertness: Awake/alert    Balance     End of Session PT - End of Session Activity Tolerance: Patient limited by fatigue;Patient limited by lethargy Patient left: in bed;with nursing/sitter in room (to get EKG for incr. HR 121.)   GP     Rada Hay 10/10/2012,  3:28 PM

## 2012-10-10 NOTE — Progress Notes (Addendum)
Subjective: 2 Days Post-Op Procedure(s) (LRB): RIGHT TOTAL KNEE ARTHROPLASTY (Right) Patient reports pain as moderate.   Patient seen in rounds with Dr. Lequita Halt. Patient is well, but has had some minor complaints of pain in the knee, requiring pain medications. HGB down to 6.6.  Blood ordered. Plan is to go Skilled nursing facility after hospital stay.  Objective: Vital signs in last 24 hours: Temp:  [98.8 F (37.1 C)-99.8 F (37.7 C)] 99.4 F (37.4 C) (07/16 0620) Pulse Rate:  [87-107] 107 (07/16 0620) Resp:  [16] 16 (07/16 0620) BP: (123-165)/(57-75) 123/57 mmHg (07/16 0620) SpO2:  [94 %-100 %] 95 % (07/16 0620)  Intake/Output from previous day:  Intake/Output Summary (Last 24 hours) at 10/10/12 0751 Last data filed at 10/10/12 8469  Gross per 24 hour  Intake 1690.17 ml  Output   1550 ml  Net 140.17 ml    Intake/Output this shift:    Labs:  Recent Labs  10/09/12 0450 10/10/12 0455  HGB 8.3* 6.6*    Recent Labs  10/09/12 0450 10/10/12 0455  WBC 25.7* 36.9*  RBC 2.88* 2.22*  HCT 25.5* 19.3*  PLT 167 111*    Recent Labs  10/09/12 0450 10/10/12 0455  NA 130* 124*  K 4.0 4.1  CL 96 91*  CO2 27 26  BUN 15 11  CREATININE 0.80 0.65  GLUCOSE 160* 126*  CALCIUM 8.3* 8.4   No results found for this basename: LABPT, INR,  in the last 72 hours  EXAM General - Patient is Alert, Appropriate and Oriented Extremity - Neurovascular intact Sensation intact distally Dorsiflexion/Plantar flexion intact Dressing/Incision - some blistering noted along with swelling and ecchymosis Motor Function - intact, moving foot and toes well on exam.   Past Medical History  Diagnosis Date  . Mixed hyperlipidemia   . Hypothyroidism   . GERD (gastroesophageal reflux disease)   . Atrial fibrillation     Remote amiodarone use, recurred in 07/2011 post op from knee arthroplasty  . Obese   . Asymptomatic cholelithiasis   . History of blood transfusion 08/31/2011    A+  type  . History of GI bleed     Gastritis, was on Xarelto and ASA  . Carpal tunnel syndrome   . Arthritis     Knees  . DJD (degenerative joint disease), thoracolumbar   . Rheumatoid arthritis   . H/O bronchitis   . Hemorrhoids   . Urinary incontinence   . Measles     as child  . Mumps     as child  . Carotid artery disease     Assessment/Plan: 2 Days Post-Op Procedure(s) (LRB): RIGHT TOTAL KNEE ARTHROPLASTY (Right) Principal Problem:   OA (osteoarthritis) of knee Active Problems:   Hyponatremia   Postoperative anemia due to acute blood loss   Postop Transfusion  Estimated body mass index is 28.12 kg/(m^2) as calculated from the following:   Height as of this encounter: 5\' 5"  (1.651 m).   Weight as of this encounter: 76.658 kg (169 lb). Up with therapy Discharge to SNF Blood today.  Recheck labs in AM. DC Lovenox  DVT Prophylaxis - Aspirin, TED hose and PAS Weight-Bearing as tolerated to right leg   Addendum Note - Patient was noted to have a Urinary Tract Infection preop that was documented in the admission H&P. Urine culture shows Results for KEIONA, JENISON (MRN [id number]) as of 10/10/2012 10:45  Ref. Range 10/02/2012 11:45  Organism ID, Bacteria No range found PROTEUS VULGARIS  Results  for DAENA, ALPER (MRN [id number]) as of 10/10/2012 10:45  Ref. Range 10/02/2012 11:45  Organism ID, Bacteria No range found ENTEROCOCCUS SPECIES  The patient was treated preoperatively for the diagnosis.  PERKINS, ALEXZANDREW 10/10/2012, 7:51 AM

## 2012-10-10 NOTE — Progress Notes (Signed)
Physical Therapy Treatment Patient Details Name: [name] [name] MRN: [id number] DOB: Oct 23, 1930 Today's Date: 10/10/2012 Time: 8295-6213 PT Time Calculation (min): 25 min  PT Assessment / Plan / Recommendation  PT Comments   Pt received 2 units of blood today. Did not perform There ex, urgency for BSC.  Follow Up Recommendations  SNF     Does the patient have the potential to tolerate intense rehabilitation     Barriers to Discharge        Equipment Recommendations       Recommendations for Other Services    Frequency 7X/week   Progress towards PT Goals Progress towards PT goals: Progressing toward goals  Plan      Precautions / Restrictions Precautions Precautions: Knee Precaution Comments: monitor HR,Sats, blisters R knee Required Braces or Orthoses: Knee Immobilizer - Right   Pertinent Vitals/Pain 5 R knee, after meds.    Mobility  Bed Mobility Supine to Sit: 3: Mod assist;With rails;HOB elevated Sit to Supine: 3: Mod assist Details for Bed Mobility Assistance: assist RLE onto bed. Transfers Transfers: Stand Pivot Transfers Sit to Stand: 3: Mod assist Sit to Stand: Patient Percentage: 60% Stand to Sit: 3: Mod assist Stand Pivot Transfers: 1: +2 Total assist Stand Pivot Transfers: Patient Percentage: 60% Details for Transfer Assistance: Pt pivoted from BED to [facility] then to recliner with + 2 assist and RW. Nausea after pivot.. Ambulation/Gait Ambulation/Gait Assistance: Not tested (comment)    Exercises     PT Diagnosis:    PT Problem List:   PT Treatment Interventions:     PT Goals (current goals can now be found in the care plan section)    Visit Information  Last PT Received On: 10/10/12 Assistance Needed: +2    Subjective Data      Cognition  Cognition Arousal/Alertness: Awake/alert    Balance     End of Session PT - End of Session Activity Tolerance: Patient limited by fatigue;Patient limited by pain Patient left: in recliner.;with call  bell/phone within reach   GP     Rada Hay 10/10/2012, 3:22 PM

## 2012-10-11 ENCOUNTER — Encounter (HOSPITAL_COMMUNITY)
Admission: RE | Disposition: A | Discharge status: Skilled Nursing Facility | Payer: Self-pay | Source: Ambulatory Visit | Attending: Orthopedic Surgery

## 2012-10-11 ENCOUNTER — Inpatient Hospital Stay (HOSPITAL_COMMUNITY): Payer: Medicare Other | Admitting: Anesthesiology

## 2012-10-11 ENCOUNTER — Inpatient Hospital Stay (HOSPITAL_COMMUNITY): Payer: Medicare Other

## 2012-10-11 ENCOUNTER — Encounter (HOSPITAL_COMMUNITY): Payer: Self-pay | Admitting: Anesthesiology

## 2012-10-11 ENCOUNTER — Encounter (HOSPITAL_COMMUNITY): Payer: Self-pay | Admitting: Orthopedic Surgery

## 2012-10-11 DIAGNOSIS — IMO0002 Reserved for concepts with insufficient information to code with codable children: Secondary | ICD-10-CM | POA: Diagnosis not present

## 2012-10-11 DIAGNOSIS — I4891 Unspecified atrial fibrillation: Secondary | ICD-10-CM

## 2012-10-11 HISTORY — PX: IRRIGATION AND DEBRIDEMENT KNEE: SHX5185

## 2012-10-11 LAB — CBC
Hemoglobin: 8.1 g/dL — ABNORMAL LOW (ref 12.0–15.0)
MCH: 30 pg (ref 26.0–34.0)
MCV: 86.3 fL (ref 78.0–100.0)
RBC: 2.7 MIL/uL — ABNORMAL LOW (ref 3.87–5.11)

## 2012-10-11 LAB — BASIC METABOLIC PANEL
CO2: 28 mEq/L (ref 19–32)
Calcium: 8.5 mg/dL (ref 8.4–10.5)
Creatinine, Ser: 0.76 mg/dL (ref 0.50–1.10)
Glucose, Bld: 92 mg/dL (ref 70–99)

## 2012-10-11 SURGERY — IRRIGATION AND DEBRIDEMENT KNEE
Anesthesia: General | Site: Knee | Laterality: Right | Wound class: Clean

## 2012-10-11 MED ORDER — HYDROMORPHONE HCL PF 1 MG/ML IJ SOLN: 0.5000 mg | INTRAMUSCULAR | Status: DC | PRN

## 2012-10-11 MED ORDER — VANCOMYCIN HCL IN DEXTROSE 1-5 GM/200ML-% IV SOLN
1000.0000 mg | Freq: Two times a day (BID) | INTRAVENOUS | Status: AC
  Administered 2012-10-11: 1000 mg via INTRAVENOUS
  Filled 2012-10-11: qty 200

## 2012-10-11 MED ORDER — DEXAMETHASONE SODIUM PHOSPHATE 4 MG/ML IJ SOLN
INTRAMUSCULAR | Status: DC | PRN
  Administered 2012-10-11: 10 mg via INTRAVENOUS

## 2012-10-11 MED ORDER — 0.9 % SODIUM CHLORIDE (POUR BTL) OPTIME
TOPICAL | Status: DC | PRN
  Administered 2012-10-11: 1000 mL

## 2012-10-11 MED ORDER — PROMETHAZINE HCL 25 MG RE SUPP: 12.5000 mg | Freq: Four times a day (QID) | RECTAL | Status: DC | PRN

## 2012-10-11 MED ORDER — DEXTROSE 5 % IV SOLN
5.0000 mg/h | INTRAVENOUS | Status: DC
  Filled 2012-10-11: qty 100

## 2012-10-11 MED ORDER — LACTATED RINGERS IV SOLN
INTRAVENOUS | Status: DC
  Administered 2012-10-11: 19:00:00 via INTRAVENOUS

## 2012-10-11 MED ORDER — DILTIAZEM LOAD VIA INFUSION
10.0000 mg | INTRAVENOUS | Status: AC
  Administered 2012-10-11: 10 mg via INTRAVENOUS
  Administered 2012-10-11: 5 mg via INTRAVENOUS

## 2012-10-11 MED ORDER — FENTANYL CITRATE 0.05 MG/ML IJ SOLN
INTRAMUSCULAR | Status: DC | PRN
  Administered 2012-10-11: 25 ug via INTRAVENOUS
  Administered 2012-10-11: 50 ug via INTRAVENOUS
  Administered 2012-10-11: 25 ug via INTRAVENOUS
  Administered 2012-10-11: 50 ug via INTRAVENOUS
  Administered 2012-10-11 (×2): 25 ug via INTRAVENOUS

## 2012-10-11 MED ORDER — ONDANSETRON HCL 4 MG/2ML IJ SOLN
INTRAMUSCULAR | Status: DC | PRN
  Administered 2012-10-11: 4 mg via INTRAVENOUS

## 2012-10-11 MED ORDER — KETAMINE HCL 10 MG/ML IJ SOLN
INTRAMUSCULAR | Status: DC | PRN
  Administered 2012-10-11: 25 mg via INTRAVENOUS

## 2012-10-11 MED ORDER — METOCLOPRAMIDE HCL 5 MG/ML IJ SOLN
INTRAMUSCULAR | Status: DC | PRN
  Administered 2012-10-11: 10 mg via INTRAVENOUS

## 2012-10-11 MED ORDER — PROPOFOL 10 MG/ML IV BOLUS
INTRAVENOUS | Status: DC | PRN
  Administered 2012-10-11: 90 mg via INTRAVENOUS

## 2012-10-11 MED ORDER — METOCLOPRAMIDE HCL 10 MG PO TABS
5.0000 mg | ORAL_TABLET | Freq: Three times a day (TID) | ORAL | Status: DC | PRN
  Administered 2012-10-13: 10 mg via ORAL

## 2012-10-11 MED ORDER — VANCOMYCIN HCL IN DEXTROSE 1-5 GM/200ML-% IV SOLN
1000.0000 mg | Freq: Once | INTRAVENOUS | Status: AC
  Administered 2012-10-11: 1000 mg via INTRAVENOUS
  Filled 2012-10-11: qty 200

## 2012-10-11 MED ORDER — METOCLOPRAMIDE HCL 5 MG/ML IJ SOLN: 5.0000 mg | Freq: Three times a day (TID) | INTRAMUSCULAR | Status: DC | PRN

## 2012-10-11 MED ORDER — CEFAZOLIN SODIUM 1-5 GM-% IV SOLN
1.0000 g | Freq: Once | INTRAVENOUS | Status: AC
  Administered 2012-10-11: 1 g via INTRAVENOUS
  Filled 2012-10-11: qty 50

## 2012-10-11 MED ORDER — PROMETHAZINE HCL 25 MG PO TABS: 25.0000 mg | ORAL_TABLET | Freq: Four times a day (QID) | ORAL | Status: DC | PRN

## 2012-10-11 MED ORDER — FENTANYL CITRATE 0.05 MG/ML IJ SOLN: 25.0000 ug | INTRAMUSCULAR | Status: DC | PRN

## 2012-10-11 MED ORDER — ONDANSETRON HCL 4 MG/2ML IJ SOLN
4.0000 mg | Freq: Four times a day (QID) | INTRAMUSCULAR | Status: DC | PRN
  Administered 2012-10-12: 4 mg via INTRAVENOUS
  Filled 2012-10-11: qty 2

## 2012-10-11 MED ORDER — LACTATED RINGERS IV SOLN
INTRAVENOUS | Status: DC
  Administered 2012-10-11: 1000 mL via INTRAVENOUS

## 2012-10-11 MED ORDER — SODIUM CHLORIDE 0.9 % IR SOLN
Status: DC | PRN
  Administered 2012-10-11: 3000 mL

## 2012-10-11 MED ORDER — ONDANSETRON HCL 4 MG PO TABS
4.0000 mg | ORAL_TABLET | Freq: Four times a day (QID) | ORAL | Status: DC | PRN
  Administered 2012-10-12: 4 mg via ORAL
  Filled 2012-10-11: qty 1

## 2012-10-11 MED ORDER — PROMETHAZINE HCL 25 MG/ML IJ SOLN
6.2500 mg | INTRAMUSCULAR | Status: DC | PRN
  Administered 2012-10-11: 12.5 mg via INTRAVENOUS

## 2012-10-11 MED ORDER — PROMETHAZINE HCL 25 MG/ML IJ SOLN: 6.2500 mg | Freq: Four times a day (QID) | INTRAMUSCULAR | Status: DC | PRN

## 2012-10-11 MED ORDER — DILTIAZEM HCL 25 MG/5ML IV SOLN
INTRAVENOUS | Status: AC
  Administered 2012-10-11: 10 mg
  Filled 2012-10-11: qty 5

## 2012-10-11 SURGICAL SUPPLY — 54 items
BAG SPEC THK2 15X12 ZIP CLS (MISCELLANEOUS) ×1
BAG ZIPLOCK 12X15 (MISCELLANEOUS) ×2 IMPLANT
BANDAGE ELASTIC 6 VELCRO ST LF (GAUZE/BANDAGES/DRESSINGS) ×2 IMPLANT
BANDAGE ESMARK 6X9 LF (GAUZE/BANDAGES/DRESSINGS) ×1 IMPLANT
BNDG CMPR 9X6 STRL LF SNTH (GAUZE/BANDAGES/DRESSINGS)
BNDG ESMARK 6X9 LF (GAUZE/BANDAGES/DRESSINGS)
CLOTH BEACON ORANGE TIMEOUT ST (SAFETY) ×2 IMPLANT
CONT SPECI 4OZ STER CLIK (MISCELLANEOUS) ×1 IMPLANT
CUFF TOURN SGL QUICK 34 (TOURNIQUET CUFF) ×2
CUFF TRNQT CYL 34X4X40X1 (TOURNIQUET CUFF) ×1 IMPLANT
DRAPE EXTREMITY T 121X128X90 (DRAPE) ×2 IMPLANT
DRAPE U-SHAPE 47X51 STRL (DRAPES) ×1 IMPLANT
DRSG ADAPTIC 3X8 NADH LF (GAUZE/BANDAGES/DRESSINGS) ×3 IMPLANT
DRSG PAD ABDOMINAL 8X10 ST (GAUZE/BANDAGES/DRESSINGS) ×2 IMPLANT
DURAPREP 26ML APPLICATOR (WOUND CARE) ×2 IMPLANT
ELECT REM PT RETURN 9FT ADLT (ELECTROSURGICAL) ×2
ELECTRODE REM PT RTRN 9FT ADLT (ELECTROSURGICAL) ×1 IMPLANT
EVACUATOR 1/8 PVC DRAIN (DRAIN) ×1 IMPLANT
GLOVE BIO SURGEON STRL SZ8 (GLOVE) ×1 IMPLANT
GLOVE BIOGEL PI IND STRL 6.5 (GLOVE) IMPLANT
GLOVE BIOGEL PI IND STRL 7.0 (GLOVE) IMPLANT
GLOVE BIOGEL PI IND STRL 7.5 (GLOVE) ×1 IMPLANT
GLOVE BIOGEL PI IND STRL 8 (GLOVE) ×1 IMPLANT
GLOVE BIOGEL PI INDICATOR 6.5 (GLOVE) ×1
GLOVE BIOGEL PI INDICATOR 7.0 (GLOVE) ×1
GLOVE BIOGEL PI INDICATOR 7.5 (GLOVE)
GLOVE BIOGEL PI INDICATOR 8 (GLOVE) ×1
GLOVE ECLIPSE 8.0 STRL XLNG CF (GLOVE) IMPLANT
GLOVE ORTHO TXT STRL SZ7.5 (GLOVE) ×2 IMPLANT
GLOVE SURG ORTHO 8.0 STRL STRW (GLOVE) ×1 IMPLANT
GLOVE SURG SS PI 6.5 STRL IVOR (GLOVE) ×2 IMPLANT
GLOVE SURG SS PI 7.0 STRL IVOR (GLOVE) ×2 IMPLANT
GOWN BRE IMP PREV XXLGXLNG (GOWN DISPOSABLE) ×2 IMPLANT
GOWN STRL NON-REIN LRG LVL3 (GOWN DISPOSABLE) ×4 IMPLANT
HANDPIECE INTERPULSE COAX TIP (DISPOSABLE) ×2
IMMOBILIZER KNEE 20 (SOFTGOODS) ×2
IMMOBILIZER KNEE 20 THIGH 36 (SOFTGOODS) IMPLANT
IV NS IRRIG 3000ML ARTHROMATIC (IV SOLUTION) ×2 IMPLANT
KIT BASIN OR (CUSTOM PROCEDURE TRAY) ×2 IMPLANT
MANIFOLD NEPTUNE II (INSTRUMENTS) ×2 IMPLANT
NS IRRIG 1000ML POUR BTL (IV SOLUTION) ×1 IMPLANT
PACK TOTAL JOINT (CUSTOM PROCEDURE TRAY) ×2 IMPLANT
PADDING CAST COTTON 6X4 STRL (CAST SUPPLIES) ×3 IMPLANT
POSITIONER SURGICAL ARM (MISCELLANEOUS) ×2 IMPLANT
SET HNDPC FAN SPRY TIP SCT (DISPOSABLE) ×1 IMPLANT
SPONGE GAUZE 4X4 12PLY (GAUZE/BANDAGES/DRESSINGS) ×2 IMPLANT
SUT MNCRL AB 4-0 PS2 18 (SUTURE) ×1 IMPLANT
SUT PDS AB 1 CT1 27 (SUTURE) ×2 IMPLANT
SUT VIC AB 2-0 CT1 27 (SUTURE) ×6
SUT VIC AB 2-0 CT1 TAPERPNT 27 (SUTURE) IMPLANT
SWAB COLLECTION DEVICE MRSA (MISCELLANEOUS) ×1 IMPLANT
TOWEL OR 17X26 10 PK STRL BLUE (TOWEL DISPOSABLE) ×4 IMPLANT
TUBE ANAEROBIC SPECIMEN COL (MISCELLANEOUS) ×1 IMPLANT
WRAP KNEE MAXI GEL POST OP (GAUZE/BANDAGES/DRESSINGS) ×1 IMPLANT

## 2012-10-11 NOTE — Progress Notes (Signed)
PT Cancellation Note  Patient Details Name: [name] [name] MRN: [id number] DOB: [birth year]   Cancelled Treatment:    Reason Eval/Treat Not Completed: Medical issues which prohibited therapy (for I/D today)   Rada Hay 10/11/2012, 12:54 PM

## 2012-10-11 NOTE — Transfer of Care (Signed)
Immediate Anesthesia Transfer of Care Note  Patient: [name] [name]  Procedure(s) Performed: Procedure(s): IRRIGATION AND DEBRIDEMENT KNEE (Right)  Patient Location: PACU  Anesthesia Type:General  Level of Consciousness: awake, patient cooperative and responds to stimulation  Airway & Oxygen Therapy: Patient Spontanous Breathing and Patient connected to nasal cannula oxygen  Post-op Assessment: Report given to PACU RN, Post -op Vital signs reviewed and stable and Patient moving all extremities X 4  Post vital signs: Reviewed and stable  Complications: No apparent anesthesia complications

## 2012-10-11 NOTE — Brief Op Note (Signed)
10/08/2012 - 10/11/2012  6:05 PM  PATIENT:  [name] [name]  79 y.o. female  PRE-OPERATIVE DIAGNOSIS:  Right hematoma  POST-OPERATIVE DIAGNOSIS:  right knee hematoma  PROCEDURE:  Procedure(s): IRRIGATION AND DEBRIDEMENT KNEE (Right)  SURGEON:  Surgeon(s) and Role:    * Loanne Drilling, MD - Primary  PHYSICIAN ASSISTANT:   ASSISTANTS: none   ANESTHESIA:   general  EBL:  Total I/O In: 650.4 [I.V.:115; Blood:335.4; IV Piggyback:200] Out: 600 [Urine:600]   LOCAL MEDICATIONS USED:  NONE  SPECIMEN:  No Specimen  COUNTS:  YES  TOURNIQUET:  * No tourniquets in log *  DICTATION: .Other Dictation: Dictation Number [id number]  PLAN OF CARE: Admit to inpatient   PATIENT DISPOSITION:  PACU - hemodynamically stable.

## 2012-10-11 NOTE — Anesthesia Preprocedure Evaluation (Addendum)
Anesthesia Evaluation  Patient identified by MRN, date of birth, ID band Patient awake  General Assessment Comment:Advanced yrs  Reviewed: Allergy & Precautions, H&P , NPO status , Patient's Chart, lab work & pertinent test results, reviewed documented beta blocker date and time   History of Anesthesia Complications (+) PONV and MALIGNANT HYPERTHERMIA  Airway Mallampati: II TM Distance: >3 FB Neck ROM: Full    Dental  (+) Dental Advisory Given   Pulmonary neg pulmonary ROS,  S/p thoracotomy for lung cyst-left breath sounds clear to auscultation        Cardiovascular hypertension, Pt. on medications + Peripheral Vascular Disease negative cardio ROS  + dysrhythmias Atrial Fibrillation Rhythm:Regular Rate:Normal  Denies cardiac symptom HTN on chart, pt denies   Neuro/Psych  Neuromuscular disease negative neurological ROS  negative psych ROS   GI/Hepatic negative GI ROS, Neg liver ROS, GERD-  ,  Endo/Other  negative endocrine ROSHypothyroidism   Renal/GU negative Renal ROS  negative genitourinary   Musculoskeletal negative musculoskeletal ROS (+) Arthritis -, Osteoarthritis,    Abdominal   Peds negative pediatric ROS (+)  Hematology negative hematology ROS (+)   Anesthesia Other Findings Caps on side  Reproductive/Obstetrics negative OB ROS                          Anesthesia Physical Anesthesia Plan  ASA: III  Anesthesia Plan: General   Post-op Pain Management:    Induction: Intravenous  Airway Management Planned: LMA  Additional Equipment:   Intra-op Plan:   Post-operative Plan: Extubation in OR  Informed Consent: I have reviewed the patients History and Physical, chart, labs and discussed the procedure including the risks, benefits and alternatives for the proposed anesthesia with the patient or authorized representative who has indicated his/her understanding and acceptance.    Dental advisory given  Plan Discussed with: CRNA  Anesthesia Plan Comments:         Anesthesia Quick Evaluation

## 2012-10-11 NOTE — Progress Notes (Signed)
Subjective: 3 Days Post-Op Procedure(s) (LRB): RIGHT TOTAL KNEE ARTHROPLASTY (Right) Patient reports pain as mild and moderate.   Patient seen in rounds with Dr. Lequita Halt.  Discussed with patient about the swelling in her knee.  Recommended to take back to the OR for formal washout later today.  Will make NPO and obtain consent. Patient is having problems with pain in the knee, requiring pain medications and continued swelling and noted blisters. Plan is to go Skilled nursing facility after hospital stay.  Objective: Vital signs in last 24 hours: Temp:  [98.5 F (36.9 C)-99.6 F (37.6 C)] 99.6 F (37.6 C) (07/17 0430) Pulse Rate:  [82-114] 85 (07/17 0430) Resp:  [14-16] 16 (07/17 0430) BP: (134-196)/(56-83) 134/56 mmHg (07/17 0430) SpO2:  [97 %] 97 % (07/17 0430)  Intake/Output from previous day:  Intake/Output Summary (Last 24 hours) at 10/11/12 0751 Last data filed at 10/11/12 0600  Gross per 24 hour  Intake 1639.34 ml  Output   1025 ml  Net 614.34 ml    Intake/Output this shift:    Labs:  Recent Labs  10/09/12 0450 10/10/12 0455 10/10/12 1521 10/11/12 0450  HGB 8.3* 6.6* 9.4* 8.1*    Recent Labs  10/10/12 0455 10/10/12 1521 10/11/12 0450  WBC 36.9*  --  24.4*  RBC 2.22*  --  2.70*  HCT 19.3* 27.2* 23.3*  PLT 111*  --  98*    Recent Labs  10/09/12 0450 10/10/12 0455  NA 130* 124*  K 4.0 4.1  CL 96 91*  CO2 27 26  BUN 15 11  CREATININE 0.80 0.65  GLUCOSE 160* 126*  CALCIUM 8.3* 8.4   No results found for this basename: LABPT, INR,  in the last 72 hours  EXAM General - Patient is Alert, Appropriate and Oriented Extremity - Neurovascular intact Sensation intact distally Dorsiflexion/Plantar flexion intact Dressing/Incision - some blistering noted along with swelling and ecchymosis Motor Function - intact, moving foot and toes well on exam.   Past Medical History  Diagnosis Date  . Mixed hyperlipidemia   . Hypothyroidism   . GERD  (gastroesophageal reflux disease)   . Atrial fibrillation     Remote amiodarone use, recurred in 07/2011 post op from knee arthroplasty  . Obese   . Asymptomatic cholelithiasis   . History of blood transfusion 08/31/2011    A+ type  . History of GI bleed     Gastritis, was on Xarelto and ASA  . Carpal tunnel syndrome   . Arthritis     Knees  . DJD (degenerative joint disease), thoracolumbar   . Rheumatoid arthritis   . H/O bronchitis   . Hemorrhoids   . Urinary incontinence   . Measles     as child  . Mumps     as child  . Carotid artery disease     Assessment/Plan: 3 Days Post-Op Procedure(s) (LRB): RIGHT TOTAL KNEE ARTHROPLASTY (Right) Principal Problem:   OA (osteoarthritis) of knee Active Problems:   Hyponatremia   Postoperative anemia due to acute blood loss   Postop Transfusion  Estimated body mass index is 28.12 kg/(m^2) as calculated from the following:   Height as of this encounter: 5\' 5"  (1.651 m).   Weight as of this encounter: 76.658 kg (169 lb). NPO for surgery later today.  May have morning meds with sips of water. Added IV rescue medication Obtain consent Vanc and Ancef prior to surgery later today.  DVT Prophylaxis - Aspirin Weight-Bearing as tolerated to right leg  PERKINS, ALEXZANDREW 10/11/2012, 7:51 AM

## 2012-10-11 NOTE — Plan of Care (Signed)
Problem: Consults Goal: Diagnosis- Total Joint Replacement Outcome: Completed/Met Date Met:  10/11/12 Primary Total Knee RIGHT

## 2012-10-11 NOTE — Consult Note (Signed)
CARDIOLOGY CONSULT NOTE  Patient ID: [name] [name] MRN: [id number] DOB/AGE: 77-Sep-1932 77 y.o.  Admit date: 10/08/2012 Primary Physician   Horald Pollen., PA-C Primary Cardiologist   Dr. Diona Browner Chief Complaint    Atrial fibrillation.  HPI:  The patient is s/p I & D of a right knee hematoma.  She had right TKR on 7/14.  We are called post operatively to help with atrial fibrillation.  She was in this atrial fibrillation postoperatively. However, she subsequently converted spontaneously to sinus rhythm with premature ventricular contractions. She is currently in sinus tachycardia. She hasn't noticed any tachycardia palpitations and has been hemodynamically stable. She has a history of atrial fibrillation post knee surgery in the past. She was treated with Xarelto for a while but had hematemesis with this. She has subsequently not been on anticoagulation. She says she hasn't noticed any fibrillation at home. She is active. She's had no recent cardiovascular symptoms. She did see Dr. Diona Browner for preoperative cardiovascular clearance.  Past Medical History  Diagnosis Date  . Mixed hyperlipidemia   . Hypothyroidism   . GERD (gastroesophageal reflux disease)   . Atrial fibrillation     Remote amiodarone use, recurred in 07/2011 post op from knee arthroplasty  . Obese   . Asymptomatic cholelithiasis   . History of blood transfusion 08/31/2011    A+ type  . History of GI bleed     Gastritis, was on Xarelto and ASA  . Carpal tunnel syndrome   . Arthritis     Knees  . DJD (degenerative joint disease), thoracolumbar   . Rheumatoid arthritis   . H/O bronchitis   . Hemorrhoids   . Urinary incontinence   . Measles     as child  . Mumps     as child  . Carotid artery disease   . PONV (postoperative nausea and vomiting)     Past Surgical History  Procedure Laterality Date  . Tonsillectomy    . Cholecystectomy  04/29/2005    Dr. Luretha Murphy  . Thoracotomy  4/07    Removal bronchogenic  cyst  . Total knee arthroplasty  08/10/2011    Procedure: TOTAL KNEE ARTHROPLASTY;  Surgeon: Loanne Drilling, MD;  Location: [facility];  Service: Orthopedics;  Laterality: Left;  . Rotator cuff repair  1992, 1993    Bilateral  . Tubal ligation      Bilateral  . Ventral hernia repair  05/25/2005    Incarcerated, lysis of adhesions, Dr. Luretha Murphy  . Appendectomy      as child  . Bronchogenic cyst  2007  . Total knee arthroplasty Right 10/08/2012    Procedure: RIGHT TOTAL KNEE ARTHROPLASTY;  Surgeon: Loanne Drilling, MD;  Location: [facility];  Service: Orthopedics;  Laterality: Right;    Allergies  Allergen Reactions  . Omeprazole Nausea And Vomiting  . Penicillins Itching  . Morphine Rash    And hot flashes  . Xarelto (Rivaroxaban) Other (See Comments)    Bleeding,spitting up blood   No current facility-administered medications on file prior to encounter.   Current Outpatient Prescriptions on File Prior to Encounter  Medication Sig Dispense Refill  . Cholecalciferol (VITAMIN D) 2000 UNITS CAPS Take 2,000 Units by mouth daily.       . ferrous sulfate 325 (65 FE) MG tablet Take 325 mg by mouth 2 (two) times daily.       [name] HYDROcodone-acetaminophen (LORTAB) 7.5-500 MG per tablet Take 1 tablet by mouth every 8 (eight)  hours as needed for pain.       [name] oxaprozin (DAYPRO) 600 MG tablet Take 600 mg by mouth 2 (two) times daily.       [name] PREMARIN vaginal cream Place 0.625 Applicatorfuls vaginally daily.      . vitamin C (ASCORBIC ACID) 500 MG tablet Take 500 mg by mouth daily.       . Zinc 50 MG TABS Take 50 mg by mouth daily.       . [DISCONTINUED] tolterodine (DETROL LA) 4 MG 24 hr capsule Take 4 mg by mouth daily.        History   Social History  . Marital Status: Widowed    Spouse Name: N/A    Number of Children: N/A  . Years of Education: N/A   Occupational History  . Not on file.   Social History Main Topics  . Smoking status: Never Smoker   . Smokeless tobacco: Never Used    . Alcohol Use: No  . Drug Use: No  . Sexually Active: Not on file   Other Topics Concern  . Not on file   Social History Narrative  . No narrative on file    Family History  Problem Relation Age of Onset  . Cancer    . Stroke    . Diabetes    . Hypertension    . Colon cancer Neg Hx   . Colon polyps Neg Hx   . Rectal cancer Neg Hx   . Stomach cancer Neg Hx     ROS:  As stated in the HPI and negative for all other systems.  Physical Exam: Blood pressure 144/65, pulse 102, temperature 99.9 F (37.7 C), temperature source Oral, resp. rate 14, height 5\' 5"  (1.651 m), weight 169 lb (76.658 kg), SpO2 99.00%.  GENERAL:  Well appearing HEENT:  Pupils equal round and reactive, fundi not visualized, oral mucosa unremarkable NECK:  No jugular venous distention, waveform within normal limits, carotid upstroke brisk and symmetric, soft right carotid bruit, no thyromegaly LYMPHATICS:  No cervical, inguinal adenopathy LUNGS:  Clear to auscultation bilaterally BACK:  No CVA tenderness CHEST:  Unremarkable HEART:  PMI not displaced or sustained,S1 and S2 within normal limits, no S4, no S3, no clicks, no rubs, right upper sternal border brief systolic murmur. ABD:  Flat, positive bowel sounds normal in frequency in pitch, no bruits, no rebound, no guarding, no midline pulsatile mass, no hepatomegaly, no splenomegaly EXT:  2 plus pulses throughout, no edema, no cyanosis no clubbing, right knee is bandaged SKIN:  No rashes no nodules NEURO:  Cranial nerves II through XII grossly intact, motor grossly intact throughout PSYCH:  Cognitively intact, oriented to person place and time  Labs: Lab Results  Component Value Date   BUN 16 10/11/2012   Lab Results  Component Value Date   CREATININE 0.76 10/11/2012   Lab Results  Component Value Date   NA 120* 10/11/2012   K 4.0 10/11/2012   CL 85* 10/11/2012   CO2 28 10/11/2012    Lab Results  Component Value Date   WBC 24.4* 10/11/2012   HGB  8.1* 10/11/2012   HCT 23.3* 10/11/2012   MCV 86.3 10/11/2012   PLT 98* 10/11/2012   EKG:  Pending  ASSESSMENT AND PLAN:    ATRIAL FIBRILLATION:  This was brief.  No indication for IV Cardizem.  The patient has been reluctant to take long-term relation since her GI bleeding. Her long-term thromboembolic risk versus bleeding benefit is  not significantly in favor of long-term anticoagulation.  Continue ASA.  At this time I am not planning further cardiac testing other than an EKG. I reviewed the previous echo report and she had poor acoustic windows with perhaps mildly reduced EF.    HTN:  She was not taking a hypertensive on admission. We will follow this and treat accordingly.     SignedRollene Rotunda 10/11/2012, 8:13 PM

## 2012-10-11 NOTE — Anesthesia Postprocedure Evaluation (Signed)
  Anesthesia Post-op Note  Patient: [name] [name]  Procedure(s) Performed: Procedure(s): IRRIGATION AND DEBRIDEMENT KNEE (Right)  Patient Location: PACU  Anesthesia Type:General  Level of Consciousness: sedated and responds to stimulation  Airway and Oxygen Therapy: Patient Spontanous Breathing  Post-op Pain: mild  Post-op Assessment: PATIENT'S CARDIOVASCULAR STATUS UNSTABLE and Respiratory Function Stable  Post-op Vital Signs: Reviewed  Complications: cardiovascular complications

## 2012-10-11 NOTE — Interval H&P Note (Signed)
History and Physical Interval Note:  10/11/2012 4:41 PM  [name] [name]  has presented today for surgery, with the diagnosis of Right hematoma  The various methods of treatment have been discussed with the patient and family. After consideration of risks, benefits and other options for treatment, the patient has consented to  Procedure(s): IRRIGATION AND DEBRIDEMENT KNEE (Right) as a surgical intervention .  The patient's history has been reviewed, patient examined, no change in status, stable for surgery.  I have reviewed the patient's chart and labs.  Questions were answered to the patient's satisfaction.     Loanne Drilling

## 2012-10-12 ENCOUNTER — Encounter (HOSPITAL_COMMUNITY): Payer: Self-pay | Admitting: Orthopedic Surgery

## 2012-10-12 DIAGNOSIS — I493 Ventricular premature depolarization: Secondary | ICD-10-CM

## 2012-10-12 LAB — TYPE AND SCREEN
ABO/RH(D): A POS
Unit division: 0

## 2012-10-12 LAB — CBC
HCT: 25.4 % — ABNORMAL LOW (ref 36.0–46.0)
Hemoglobin: 8.8 g/dL — ABNORMAL LOW (ref 12.0–15.0)
RBC: 2.94 MIL/uL — ABNORMAL LOW (ref 3.87–5.11)
WBC: 16.8 10*3/uL — ABNORMAL HIGH (ref 4.0–10.5)

## 2012-10-12 LAB — BASIC METABOLIC PANEL
BUN: 15 mg/dL (ref 6–23)
CO2: 27 mEq/L (ref 19–32)
Chloride: 93 mEq/L — ABNORMAL LOW (ref 96–112)
Glucose, Bld: 177 mg/dL — ABNORMAL HIGH (ref 70–99)
Potassium: 4.2 mEq/L (ref 3.5–5.1)
Sodium: 126 mEq/L — ABNORMAL LOW (ref 135–145)

## 2012-10-12 MED ORDER — TRAMADOL HCL 50 MG PO TABS: 50.0000 mg | ORAL_TABLET | Freq: Four times a day (QID) | ORAL | Status: DC | PRN

## 2012-10-12 MED ORDER — ATORVASTATIN CALCIUM 10 MG PO TABS
10.0000 mg | ORAL_TABLET | Freq: Every day | ORAL | Status: DC
  Filled 2012-10-12: qty 1

## 2012-10-12 MED ORDER — POLYETHYLENE GLYCOL 3350 17 G PO PACK: 17.0000 g | PACK | Freq: Every day | ORAL | Status: DC | PRN

## 2012-10-12 MED ORDER — METHOCARBAMOL 500 MG PO TABS: 500.0000 mg | ORAL_TABLET | Freq: Four times a day (QID) | ORAL | Status: DC | PRN

## 2012-10-12 MED ORDER — BISACODYL 10 MG RE SUPP: 10.0000 mg | Freq: Every day | RECTAL | Status: DC | PRN

## 2012-10-12 MED ORDER — DSS 100 MG PO CAPS: 100.0000 mg | ORAL_CAPSULE | Freq: Two times a day (BID) | ORAL | Status: DC

## 2012-10-12 MED ORDER — PROMETHAZINE HCL 25 MG PO TABS: 12.5000 mg | ORAL_TABLET | Freq: Four times a day (QID) | ORAL | Status: DC | PRN

## 2012-10-12 MED ORDER — ONDANSETRON HCL 4 MG PO TABS: 4.0000 mg | ORAL_TABLET | Freq: Four times a day (QID) | ORAL | Status: DC | PRN

## 2012-10-12 MED ORDER — ASPIRIN 325 MG PO TBEC: 325.0000 mg | DELAYED_RELEASE_TABLET | Freq: Two times a day (BID) | ORAL | Status: DC

## 2012-10-12 MED ORDER — OXYCODONE HCL 5 MG PO TABS: 5.0000 mg | ORAL_TABLET | ORAL | Status: DC | PRN

## 2012-10-12 NOTE — Progress Notes (Signed)
CSW assisting with d/c planning. Tentative d/c summary has been sent to [facility] for possible d/c on SAT. Weekend CSW will follow to assist with d/c planning to SNF.  Cori Razor LCSW [phone]

## 2012-10-12 NOTE — Evaluation (Signed)
Re-Physical Therapy Evaluation Patient Details Name: [name] [name] MRN: [id number] DOB: June 02, 1930 Today's Date: 10/12/2012 Time: 4098-1191 PT Time Calculation (min): 30 min  PT Assessment / Plan / Recommendation History of Present Illness  s/p RTKA on 10/08/12.  Pt with knee hematoma evacuation 10/11/12 and went to ICU post op to monitor carida rythm  Clinical Impression  Pt is doing well post hematoma evacuation and is able to progress with PT program and d/c plan as previously established    PT Assessment  Patient needs continued PT services    Follow Up Recommendations  SNF    Does the patient have the potential to tolerate intense rehabilitation      Barriers to Discharge Decreased caregiver support      Equipment Recommendations  None recommended by PT    Recommendations for Other Services     Frequency 7X/week    Precautions / Restrictions Precautions Precaution Comments: monitor HR,Sats, Required Braces or Orthoses: Knee Immobilizer - Right   Pertinent Vitals/Pain Pt c/o pain in knee. RN provided pain med prior to mobilization      Mobility  Bed Mobility Bed Mobility: Supine to Sit;Sit to Supine Supine to Sit: HOB elevated;With rails;1: +2 Total assist Supine to Sit: Patient Percentage: 50% Sit to Supine: 1: +2 Total assist Sit to Supine: Patient Percentage: 50% Details for Bed Mobility Assistance: assist RLE and trunk Transfers Transfers: Sit to Stand;Stand to Sit Sit to Stand: 3: Mod assist Sit to Stand: Patient Percentage: 60% Stand to Sit: 3: Mod assist Stand Pivot Transfers: 1: +2 Total assist Stand Pivot Transfers: Patient Percentage: 60% Details for Transfer Assistance: pt uses UE to push up from bed Ambulation/Gait Ambulation/Gait Assistance: 3: Mod assist Ambulation Distance (Feet): 15 Feet Assistive device: Rolling walker Ambulation/Gait Assistance Details: assist at pelvis and trunk for stability  Gait Pattern: Step-to pattern;Trunk  flexed;Decreased step length - left;Decreased step length - right;Antalgic Gait velocity: decreased General Gait Details: Pt takes extremely small steps. she is able to attempt to correct posture with cues Stairs: No Wheelchair Mobility Wheelchair Mobility: No    Exercises Total Joint Exercises Ankle Circles/Pumps: AROM;Both;5 reps;Supine Quad Sets: AROM;Right;5 reps;Supine Short Arc Quad: AAROM;AROM;Right;10 reps;Supine   PT Diagnosis: Difficulty walking;Acute pain  PT Problem List: Decreased strength;Decreased range of motion;Decreased activity tolerance;Decreased mobility;Decreased knowledge of use of DME;Decreased safety awareness;Decreased knowledge of precautions;Pain PT Treatment Interventions: DME instruction;Gait training;Functional mobility training;Therapeutic activities;Therapeutic exercise;Patient/family education     PT Goals(Current goals can be found in the care plan section) Acute Rehab PT Goals Patient Stated Goal: I want to be able to take care of myself before I go home PT Goal Formulation: With patient Time For Goal Achievement: 10/19/12 Potential to Achieve Goals: Good  Visit Information  Last PT Received On: 10/12/12 Assistance Needed: +2 History of Present Illness: s/p RTKA on 10/08/12.  Pt with knee hematoma evacuation 10/11/12 and went to ICU post op to monitor carida rythm       Prior Functioning  Home Living Family/patient expects to be discharged to:: Skilled nursing facility Living Arrangements: Alone    Cognition  Cognition Arousal/Alertness: Awake/alert Behavior During Therapy: [facility] for tasks assessed/performed Overall Cognitive Status: Within Functional Limits for tasks assessed    Extremity/Trunk Assessment Lower Extremity Assessment Lower Extremity Assessment: RLE deficits/detail RLE Deficits / Details: pt with bulky dressing over leg with ace wrap, KI in place.  Pt able to intiate quad set and tolerated flexion over folded pillow to about  30 degress.  Balance Balance Balance Assessed: Yes Static Sitting Balance Static Sitting - Balance Support: Bilateral upper extremity supported;Feet unsupported Static Sitting - Level of Assistance: 5: Stand by assistance Static Standing Balance Static Standing - Balance Support: Bilateral upper extremity supported;During functional activity Static Standing - Level of Assistance: 5: Stand by assistance  End of Session PT - End of Session Activity Tolerance: Patient tolerated treatment well Patient left: in bed Nurse Communication: Mobility status  GP    Rosey Bath K. [place], [place] [phone] 10/12/2012, 2:01 PM

## 2012-10-12 NOTE — Progress Notes (Signed)
Pt c/o of "sharp" pain in left lower chest. 12 lead EKG obtained. NSR with PVC. Dr. Despina Hick aware.

## 2012-10-12 NOTE — Progress Notes (Signed)
Subjective: 1 Day Post-Op Procedure(s) (LRB): IRRIGATION AND DEBRIDEMENT KNEE (Right) 4 Days Post-Op Procedure(s) (LRB):  RIGHT TOTAL KNEE ARTHROPLASTY (Right)  Patient reports pain as mild.   Patient seen in rounds with Dr. Lequita Halt.  She was placed in stepdown last night following the surgery due to increased HR and A.fib.  Cardiology consulted.  Converted spontaneously.  Did well thru the night.  In sinus rhythm this morning.  Will transfer back up to floor today.  Patient is well, but has had some minor complaints of pain in the knee, requiring pain medications this morning. We will start therapy today.  Plan is to go Skilled nursing facility after hospital stay.  She is looking into [facility].  Possible transfer tomorrow if does well today and not further issues.  Objective: Vital signs in last 24 hours: Temp:  [97.7 F (36.5 C)-100.3 F (37.9 C)] 97.7 F (36.5 C) (07/18 0400) Pulse Rate:  [76-123] 77 (07/18 0600) Resp:  [12-22] 20 (07/18 0600) BP: (110-202)/(40-95) 128/47 mmHg (07/18 0600) SpO2:  [94 %-100 %] 100 % (07/18 0600)  Intake/Output from previous day:  Intake/Output Summary (Last 24 hours) at 10/12/12 0826 Last data filed at 10/12/12 0600  Gross per 24 hour  Intake 3025.42 ml  Output   3025 ml  Net   0.42 ml    Labs:  Recent Labs  10/10/12 0455 10/10/12 1521 10/11/12 0450 10/12/12 0338  HGB 6.6* 9.4* 8.1* 8.8*    Recent Labs  10/11/12 0450 10/12/12 0338  WBC 24.4* 16.8*  RBC 2.70* 2.94*  HCT 23.3* 25.4*  PLT 98* 100*    Recent Labs  10/11/12 0450 10/12/12 0338  NA 120* 126*  K 4.0 4.2  CL 85* 93*  CO2 28 27  BUN 16 15  CREATININE 0.76 0.59  GLUCOSE 92 177*  CALCIUM 8.5 8.4   No results found for this basename: LABPT, INR,  in the last 72 hours  EXAM General - Patient is Alert, Appropriate and Oriented Extremity - Neurovascular intact Sensation intact distally Dorsiflexion/Plantar flexion intact Dressing - dressing  C/D/I Motor Function - intact, moving foot and toes well on exam.    Past Medical History  Diagnosis Date  . Mixed hyperlipidemia   . Hypothyroidism   . GERD (gastroesophageal reflux disease)   . Atrial fibrillation     Remote amiodarone use, recurred in 07/2011 post op from knee arthroplasty  . Obese   . Asymptomatic cholelithiasis   . History of blood transfusion 08/31/2011    A+ type  . History of GI bleed     Gastritis, was on Xarelto and ASA  . Carpal tunnel syndrome   . Arthritis     Knees  . DJD (degenerative joint disease), thoracolumbar   . Rheumatoid arthritis   . H/O bronchitis   . Hemorrhoids   . Urinary incontinence   . Measles     as child  . Mumps     as child  . Carotid artery disease   . PONV (postoperative nausea and vomiting)     Assessment/Plan: 1 Day Post-Op Procedure(s) (LRB): IRRIGATION AND DEBRIDEMENT KNEE (Right) 4 Days Post-Op Procedure(s) (LRB):  RIGHT TOTAL KNEE ARTHROPLASTY (Right)  Principal Problem:   OA (osteoarthritis) of knee Active Problems:   Atrial fibrillation   Hyponatremia   Postoperative anemia due to acute blood loss   Postop Transfusion   Hematoma following procedure   PVC's (premature ventricular contractions)  Estimated body mass index is 28.12 kg/(m^2) as  calculated from the following:   Height as of this encounter: 5\' 5"  (1.651 m).   Weight as of this encounter: 76.658 kg (169 lb). Advance diet Up with therapy Plan for discharge tomorrow Discharge to SNF  Transfer back up to Ortho Floor Resume therapy Plan for SNF tomorrow if does well and no further issues. Prepare DC Summary in anticipation of transfer tomorrow  DVT Prophylaxis - Aspirin Weight-Bearing as tolerated to right leg No vaccines. D/C O2 and Pulse OX and try on Room Air  Patient was noted to have a Urinary Tract Infection preop that was documented in the admission H&P. Urine culture shows Results for MEKISHA, BITTEL (MRN [id number]) as of  10/10/2012 10:45   Ref. Range  10/02/2012 11:45   Organism ID, Bacteria  No range found  PROTEUS VULGARIS   Results for MAIE, KESINGER (MRN [id number]) as of 10/10/2012 10:45   Ref. Range  10/02/2012 11:45   Organism ID, Bacteria  No range found  ENTEROCOCCUS SPECIES   The patient was treated preoperatively for the diagnosis.  Wilber Fini 10/12/2012, 8:26 AM

## 2012-10-12 NOTE — Discharge Summary (Signed)
Physician Discharge Summary   Patient ID: [name] [name] MRN: [id number] DOB/AGE: 1931-02-15 79 y.o.  Admit date: 10/08/2012 Discharge date: Tentative Date of Discharge - Saturday 10/13/2012  Primary Diagnosis:  Osteoarthritis Right knee  Admission Diagnoses:  Past Medical History  Diagnosis Date  . Mixed hyperlipidemia   . Hypothyroidism   . GERD (gastroesophageal reflux disease)   . Atrial fibrillation     Remote amiodarone use, recurred in 07/2011 post op from knee arthroplasty  . Obese   . Asymptomatic cholelithiasis   . History of blood transfusion 08/31/2011    A+ type  . History of GI bleed     Gastritis, was on Xarelto and ASA  . Carpal tunnel syndrome   . Arthritis     Knees  . DJD (degenerative joint disease), thoracolumbar   . Rheumatoid arthritis   . H/O bronchitis   . Hemorrhoids   . Urinary incontinence   . Measles     as child  . Mumps     as child  . Carotid artery disease   . PONV (postoperative nausea and vomiting)    Discharge Diagnoses:   Principal Problem:   OA (osteoarthritis) of knee Active Problems:   Atrial fibrillation   Hyponatremia   Postoperative anemia due to acute blood loss   Postop Transfusion   Hematoma following procedure   PVC's (premature ventricular contractions)  Estimated body mass index is 28.12 kg/(m^2) as calculated from the following:   Height as of this encounter: 5\' 5"  (1.651 m).   Weight as of this encounter: 76.658 kg (169 lb).  Procedure:  Procedure(s) (LRB): IRRIGATION AND DEBRIDEMENT KNEE (Right)   Consults: cardiology  HPI: [name] [name] is a 79 y.o. year old female with end stage OA of her right knee with progressively worsening pain and dysfunction. She has constant pain, with activity and at rest and significant functional deficits with difficulties even with ADLs. She has had extensive non-op management including analgesics, injections of cortisone and viscosupplements, and home exercise program, but remains  in significant pain with significant dysfunction.Radiographs show bone on bone arthritis all 3 compartments. She presents now for right Total Knee Arthroplasty.   Laboratory Data: Admission on 10/08/2012  Component Date Value Range Status  . ABO/RH(D) 10/08/2012 A POS   Final  . Antibody Screen 10/08/2012 NEG   Final  . Sample Expiration 10/08/2012 10/11/2012   Final  . Unit Number 10/08/2012 [id number]   Final  . Blood Component Type 10/08/2012 RED CELLS,LR   Final  . Unit division 10/08/2012 00   Final  . Status of Unit 10/08/2012 ISSUED,FINAL   Final  . Transfusion Status 10/08/2012 OK TO TRANSFUSE   Final  . Crossmatch Result 10/08/2012 Compatible   Final  . Unit Number 10/08/2012 [id number]   Final  . Blood Component Type 10/08/2012 RED CELLS,LR   Final  . Unit division 10/08/2012 00   Final  . Status of Unit 10/08/2012 ISSUED,FINAL   Final  . Transfusion Status 10/08/2012 OK TO TRANSFUSE   Final  . Crossmatch Result 10/08/2012 Compatible   Final  . Unit Number 10/08/2012 [id number]   Final  . Blood Component Type 10/08/2012 RED CELLS,LR   Final  . Unit division 10/08/2012 00   Final  . Status of Unit 10/08/2012 ISSUED,FINAL   Final  . Transfusion Status 10/08/2012 OK TO TRANSFUSE   Final  . Crossmatch Result 10/08/2012 Compatible   Final  . WBC 10/09/2012 25.7* 4.0 - 10.5 K/uL Final  .  RBC 10/09/2012 2.88* 3.87 - 5.11 MIL/uL Final  . Hemoglobin 10/09/2012 8.3* 12.0 - 15.0 g/dL Final  . HCT 16/12/9602 25.5* 36.0 - 46.0 % Final  . MCV 10/09/2012 88.5  78.0 - 100.0 fL Final  . MCH 10/09/2012 28.8  26.0 - 34.0 pg Final  . MCHC 10/09/2012 32.5  30.0 - 36.0 g/dL Final  . RDW 54/11/8117 15.3  11.5 - 15.5 % Final  . Platelets 10/09/2012 167  150 - 400 K/uL Final  . Sodium 10/09/2012 130* 135 - 145 mEq/L Final  . Potassium 10/09/2012 4.0  3.5 - 5.1 mEq/L Final  . Chloride 10/09/2012 96  96 - 112 mEq/L Final  . CO2 10/09/2012 27  19 - 32 mEq/L Final  . Glucose, Bld  10/09/2012 160* 70 - 99 mg/dL Final  . BUN 14/78/2956 15  6 - 23 mg/dL Final  . Creatinine, Ser 10/09/2012 0.80  0.50 - 1.10 mg/dL Final  . Calcium 21/30/8657 8.3* 8.4 - 10.5 mg/dL Final  . GFR calc non Af Amer 10/09/2012 68* >90 mL/min Final  . GFR calc Af Amer 10/09/2012 79* >90 mL/min Final   Comment:                                 The eGFR has been calculated                          using the CKD EPI equation.                          This calculation has not been                          validated in all clinical                          situations.                          eGFR's persistently                          <90 mL/min signify                          possible Chronic Kidney Disease.  . WBC 10/10/2012 36.9* 4.0 - 10.5 K/uL Final   REPEATED TO VERIFY  . RBC 10/10/2012 2.22* 3.87 - 5.11 MIL/uL Final  . Hemoglobin 10/10/2012 6.6* 12.0 - 15.0 g/dL Final   Comment: DELTA CHECK NOTED                          REPEATED TO VERIFY                          CRITICAL RESULT CALLED TO, READ BACK BY AND VERIFIED WITH:                          PURDY,J/6E @0528  ON 10/10/12 BY KARCZEWSKI,S.  . HCT 10/10/2012 19.3* 36.0 - 46.0 % Final  . MCV 10/10/2012 86.9  78.0 - 100.0 fL Final  . MCH 10/10/2012 29.7  26.0 - 34.0 pg Final  .  MCHC 10/10/2012 34.2  30.0 - 36.0 g/dL Final  . RDW 84/69/6295 15.5  11.5 - 15.5 % Final  . Platelets 10/10/2012 111* 150 - 400 K/uL Final   Comment: SPECIMEN CHECKED FOR CLOTS                          REPEATED TO VERIFY                          DELTA CHECK NOTED                          PLATELET COUNT CONFIRMED BY SMEAR  . Sodium 10/10/2012 124* 135 - 145 mEq/L Final  . Potassium 10/10/2012 4.1  3.5 - 5.1 mEq/L Final  . Chloride 10/10/2012 91* 96 - 112 mEq/L Final  . CO2 10/10/2012 26  19 - 32 mEq/L Final  . Glucose, Bld 10/10/2012 126* 70 - 99 mg/dL Final  . BUN 28/41/3244 11  6 - 23 mg/dL Final  . Creatinine, Ser 10/10/2012 0.65  0.50 - 1.10 mg/dL Final    . Calcium 03/30/7251 8.4  8.4 - 10.5 mg/dL Final  . GFR calc non Af Amer 10/10/2012 82* >90 mL/min Final  . GFR calc Af Amer 10/10/2012 >90  >90 mL/min Final   Comment:                                 The eGFR has been calculated                          using the CKD EPI equation.                          This calculation has not been                          validated in all clinical                          situations.                          eGFR's persistently                          <90 mL/min signify                          possible Chronic Kidney Disease.  . Order Confirmation 10/10/2012 ORDER PROCESSED BY BLOOD BANK   Final  . Hemoglobin 10/10/2012 9.4* 12.0 - 15.0 g/dL Final   Comment: DELTA CHECK NOTED                          POST TRANSFUSION SPECIMEN  . HCT 10/10/2012 27.2* 36.0 - 46.0 % Final  . WBC 10/11/2012 24.4* 4.0 - 10.5 K/uL Final  . RBC 10/11/2012 2.70* 3.87 - 5.11 MIL/uL Final  . Hemoglobin 10/11/2012 8.1* 12.0 - 15.0 g/dL Final  . HCT 66/44/0347 23.3* 36.0 - 46.0 % Final  . MCV 10/11/2012 86.3  78.0 - 100.0 fL Final  . MCH 10/11/2012 30.0  26.0 -  34.0 pg Final  . MCHC 10/11/2012 34.8  30.0 - 36.0 g/dL Final  . RDW 09/81/1914 15.3  11.5 - 15.5 % Final  . Platelets 10/11/2012 98* 150 - 400 K/uL Final   CONSISTENT WITH PREVIOUS RESULT  . Sodium 10/11/2012 120* 135 - 145 mEq/L Final  . Potassium 10/11/2012 4.0  3.5 - 5.1 mEq/L Final  . Chloride 10/11/2012 85* 96 - 112 mEq/L Final  . CO2 10/11/2012 28  19 - 32 mEq/L Final  . Glucose, Bld 10/11/2012 92  70 - 99 mg/dL Final  . BUN 78/29/5621 16  6 - 23 mg/dL Final  . Creatinine, Ser 10/11/2012 0.76  0.50 - 1.10 mg/dL Final  . Calcium 30/86/5784 8.5  8.4 - 10.5 mg/dL Final  . GFR calc non Af Amer 10/11/2012 78* >90 mL/min Final  . GFR calc Af Amer 10/11/2012 90* >90 mL/min Final   Comment:                                 The eGFR has been calculated                          using the CKD EPI equation.                           This calculation has not been                          validated in all clinical                          situations.                          eGFR's persistently                          <90 mL/min signify                          possible Chronic Kidney Disease.  . Order Confirmation 10/11/2012 ORDER PROCESSED BY BLOOD BANK   Final  . WBC 10/12/2012 16.8* 4.0 - 10.5 K/uL Final  . RBC 10/12/2012 2.94* 3.87 - 5.11 MIL/uL Final  . Hemoglobin 10/12/2012 8.8* 12.0 - 15.0 g/dL Final  . HCT 69/62/9528 25.4* 36.0 - 46.0 % Final  . MCV 10/12/2012 86.4  78.0 - 100.0 fL Final  . MCH 10/12/2012 29.9  26.0 - 34.0 pg Final  . MCHC 10/12/2012 34.6  30.0 - 36.0 g/dL Final  . RDW 41/32/4401 15.0  11.5 - 15.5 % Final  . Platelets 10/12/2012 100* 150 - 400 K/uL Final   CONSISTENT WITH PREVIOUS RESULT  . Sodium 10/12/2012 126* 135 - 145 mEq/L Final  . Potassium 10/12/2012 4.2  3.5 - 5.1 mEq/L Final  . Chloride 10/12/2012 93* 96 - 112 mEq/L Final   DELTA CHECK NOTED  . CO2 10/12/2012 27  19 - 32 mEq/L Final  . Glucose, Bld 10/12/2012 177* 70 - 99 mg/dL Final  . BUN 02/72/5366 15  6 - 23 mg/dL Final  . Creatinine, Ser 10/12/2012 0.59  0.50 - 1.10 mg/dL Final  . Calcium 44/05/4740 8.4  8.4 - 10.5 mg/dL Final  . GFR  calc non Af Amer 10/12/2012 84* >90 mL/min Final  . GFR calc Af Amer 10/12/2012 >90  >90 mL/min Final   Comment:                                 The eGFR has been calculated                          using the CKD EPI equation.                          This calculation has not been                          validated in all clinical                          situations.                          eGFR's persistently                          <90 mL/min signify                          possible Chronic Kidney Disease.  Hospital Outpatient Visit on 10/02/2012  Component Date Value Range Status  . MRSA, PCR 10/02/2012 POSITIVE* NEGATIVE Final  . Staphylococcus aureus  10/02/2012 POSITIVE* NEGATIVE Final   Comment:                                 The Xpert SA Assay (FDA                          approved for NASAL specimens                          in patients over 73 years of age),                          is one component of                          a comprehensive surveillance                          program.  Test performance has                          been validated by [employer] for patients greater                          than or equal to 65 year old.                          It is not intended  to diagnose infection nor to                          guide or monitor treatment.  [name] aPTT 10/02/2012 32  24 - 37 seconds Final  . WBC 10/02/2012 8.9  4.0 - 10.5 K/uL Final  . RBC 10/02/2012 4.11  3.87 - 5.11 MIL/uL Final  . Hemoglobin 10/02/2012 11.9* 12.0 - 15.0 g/dL Final  . HCT 09/81/1914 36.6  36.0 - 46.0 % Final  . MCV 10/02/2012 89.1  78.0 - 100.0 fL Final  . MCH 10/02/2012 29.0  26.0 - 34.0 pg Final  . MCHC 10/02/2012 32.5  30.0 - 36.0 g/dL Final  . RDW 78/29/5621 15.0  11.5 - 15.5 % Final  . Platelets 10/02/2012 276  150 - 400 K/uL Final  . Sodium 10/02/2012 134* 135 - 145 mEq/L Final  . Potassium 10/02/2012 4.2  3.5 - 5.1 mEq/L Final  . Chloride 10/02/2012 97  96 - 112 mEq/L Final  . CO2 10/02/2012 28  19 - 32 mEq/L Final  . Glucose, Bld 10/02/2012 91  70 - 99 mg/dL Final  . BUN 30/86/5784 14  6 - 23 mg/dL Final  . Creatinine, Ser 10/02/2012 0.83  0.50 - 1.10 mg/dL Final  . Calcium 69/62/9528 9.2  8.4 - 10.5 mg/dL Final  . Total Protein 10/02/2012 7.7  6.0 - 8.3 g/dL Final  . Albumin 41/32/4401 3.9  3.5 - 5.2 g/dL Final  . AST 02/72/5366 18  0 - 37 U/L Final  . ALT 10/02/2012 7  0 - 35 U/L Final  . Alkaline Phosphatase 10/02/2012 60  39 - 117 U/L Final  . Total Bilirubin 10/02/2012 0.2* 0.3 - 1.2 mg/dL Final  . GFR calc non Af Amer 10/02/2012 65* >90 mL/min Final  . GFR calc Af Amer  10/02/2012 75* >90 mL/min Final   Comment:                                 The eGFR has been calculated                          using the CKD EPI equation.                          This calculation has not been                          validated in all clinical                          situations.                          eGFR's persistently                          <90 mL/min signify                          possible Chronic Kidney Disease.  [name] Prothrombin Time 10/02/2012 13.4  11.6 - 15.2 seconds Final  . INR 10/02/2012 1.04  0.00 - 1.49 Final  . Color, Urine 10/02/2012 YELLOW  YELLOW Final  . APPearance 10/02/2012 CLOUDY* CLEAR  Final  . Specific Gravity, Urine 10/02/2012 1.020  1.005 - 1.030 Final  . pH 10/02/2012 6.0  5.0 - 8.0 Final  . Glucose, UA 10/02/2012 NEGATIVE  NEGATIVE mg/dL Final  . Hgb urine dipstick 10/02/2012 NEGATIVE  NEGATIVE Final  . Bilirubin Urine 10/02/2012 NEGATIVE  NEGATIVE Final  . Ketones, ur 10/02/2012 NEGATIVE  NEGATIVE mg/dL Final  . Protein, ur 14/78/2956 NEGATIVE  NEGATIVE mg/dL Final  . Urobilinogen, UA 10/02/2012 0.2  0.0 - 1.0 mg/dL Final  . Nitrite 21/30/8657 NEGATIVE  NEGATIVE Final  . Leukocytes, UA 10/02/2012 LARGE* NEGATIVE Final  . Squamous Epithelial / LPF 10/02/2012 FEW* RARE Final  . WBC, UA 10/02/2012 TOO NUMEROUS TO COUNT  <3 WBC/hpf Final  . Bacteria, UA 10/02/2012 FEW* RARE Final  . Specimen Description 10/02/2012 URINE, CLEAN CATCH   Final  . Special Requests 10/02/2012 NONE   Final  . Culture  Setup Time 10/02/2012 10/02/2012 22:22   Final  . Colony Count 10/02/2012 >=100,000 COLONIES/ML   Final  . Culture 10/02/2012    Final                   Value:PROTEUS VULGARIS                         ENTEROCOCCUS SPECIES  . Report Status 10/02/2012 10/05/2012 FINAL   Final  . Organism ID, Bacteria 10/02/2012 PROTEUS VULGARIS   Final  . Organism ID, Bacteria 10/02/2012 ENTEROCOCCUS SPECIES   Final     X-Rays:Dg Chest 2 View  10/11/2012    *RADIOLOGY REPORT*  Clinical Data: Cough postoperatively  CHEST - 2 VIEW  Comparison: Chest x-ray of 08/30/2012  Findings: No definite pneumonia is seen.  There is slight elevation of the left hemidiaphragm.  No effusion is seen.  The heart is mildly enlarged and stable.  No acute bony abnormality is noted.  IMPRESSION: No active lung disease.  Slight elevation of the left hemidiaphragm.   Original Report Authenticated By: Dwyane Dee, M.D.   Dg Foot Complete Right  10/08/2012   *RADIOLOGY REPORT*  Clinical Data: Fall with contusion of the right foot.  RIGHT FOOT COMPLETE - 3+ VIEW  Comparison: None.  Findings: Type 1 accessory navicular observed.  No malalignment at the Lisfranc joint.  Bony demineralization is present.  There is spurring at the base of the proximal phalanx of the great toe.  Plantar calcaneal spur noted.  No discrete fracture identified.  IMPRESSION: 1.  Bony demineralization, without fracture.  If symptoms persist despite conservative therapy, MRI followup may be warranted. 2.  Plantar calcaneal spur.  Clinically significant discrepancy from primary report, if provided: None   Original Report Authenticated By: Gaylyn Rong, M.D.    EKG: Orders placed during the hospital encounter of 10/08/12  . EKG 12-LEAD  . EKG 12-LEAD  . EKG 12-LEAD  . EKG 12-LEAD  . EKG 12-LEAD  . EKG 12-LEAD     Hospital Course: [name] [name] is a 79 y.o. who was admitted to [facility] They were brought to the operating room on 10/08/2012 - 10/11/2012 and underwent Procedure(s): IRRIGATION AND DEBRIDEMENT KNEE.  Patient tolerated the procedure well and was later transferred to the recovery room and then to the orthopaedic floor for postoperative care.  They were given PO and IV analgesics for pain control following their surgery.  They were given 24 hours of postoperative antibiotics. They were started on DVT prophylaxis in the form of Lovenox.  PT and OT were ordered for total joint protocol.   Discharge planning consulted to help with postop disposition and equipment needs.  Patient had a rough night on the evening of surgery due to moderate pain and sickness.  They started to get up OOB with therapy on day one and walked over 30 feet the first time. Hemovac drain was pulled without difficulty.  Continued to work with therapy into day two.  Dressing was changed on day two and the incision was noted to have some blistering along with swelling and ecchymosis.  It was felt that this was due to the blood thinner Lovenox.  She was switched over to Aspirin BID. Patient seen in rounds on the following day, POD 3, with Dr. Lequita Halt. Discussed with patient about the swelling in her knee. Recommended to take back to the OR for formal washout. Made NPO and obtained consent.  She was taken back to the operating room and underwent IRRIGATION AND DEBRIDEMENT KNEE (Right). Unfortunately, she went into atrial fib postop in recovery and was transferred down to [facility] for monitoring.  Cardiology was consulted. The patient is s/p I & D of a right knee hematoma. She had right TKR on 7/14. We are called post operatively to help with atrial fibrillation. She was in this atrial fibrillation postoperatively. However, she subsequently converted spontaneously to sinus rhythm with premature ventricular contractions. She is currently in sinus tachycardia. She hasn't noticed any tachycardia palpitations and has been hemodynamically stable. She has a history of atrial fibrillation post knee surgery in the past. She was treated with Xarelto for a while but had hematemesis with this. She has subsequently not been on anticoagulation. She says she hasn't noticed any fibrillation at home. She is active. She's had no recent cardiovascular symptoms. She did see Dr. Diona Browner for preoperative cardiovascular clearance. ASSESSMENT AND PLAN:  ATRIAL FIBRILLATION: This was brief. No indication for IV Cardizem. The patient has been reluctant to  take long-term relation since her GI bleeding. Her long-term thromboembolic risk versus bleeding benefit is not significantly in favor of long-term anticoagulation. Continue ASA. At this time I am not planning further cardiac testing other than an EKG. I reviewed the previous echo report and she had poor acoustic windows with perhaps mildly reduced EF.  HTN: She was not taking a hypertensive on admission. We will follow this and treat accordingly.  Patient was seen in rounds on POD 4 and was doing better.  She was in sinus rhythm and doing better.  She was then transferred back up to the ortho floor.  Plan was to go Skilled nursing facility after hospital stay. She was looking into [facility]. Possible transfer tomorrow if does well today and no further issues. It was felt that as long as the patient continued to improve, they would be ready to transfer the following day, Saturday 10/13/2012.  The patient will evaluated by the weekend coverage staff and will discharge if doing well.  This summary was prepared in anticipation of the the patient's transfer over the weekend.   Discharge Medications: Prior to Admission medications   Medication Sig Start Date End Date Taking? Authorizing Provider  ALPRAZolam Prudy Feeler) 0.5 MG tablet Take 0.5-1 mg by mouth 3 (three) times daily as needed for sleep or anxiety. Take 1 in morning and 2 every evening for anxiety 06/26/12  Yes Horald Pollen, PA-C  esomeprazole (NEXIUM) 40 MG capsule Take 40 mg by mouth daily. 06/26/12  Yes Horald Pollen, PA-C  gabapentin (  NEURONTIN) 300 MG capsule Take 300 mg by mouth 2 (two) times daily. 06/26/12  Yes Horald Pollen, PA-C  levothyroxine (SYNTHROID, LEVOTHROID) 137 MCG tablet Take 137 mcg by mouth daily before breakfast.   Yes Historical Provider, MD  simvastatin (ZOCOR) 20 MG tablet Take 20 mg by mouth every morning. 06/26/12  Yes Horald Pollen, PA-C  aspirin EC 325 MG EC tablet Take 1 tablet (325 mg total) by mouth 2 (two) times  daily. Take 325 mg Aspirin BID for three weeks, then discontinue the 325 mg and resume the 81 mg Aspirin daily at that time. 10/12/12   Koichi Platte Julien Girt, PA-C  bisacodyl (DULCOLAX) 10 MG suppository Place 1 suppository (10 mg total) rectally daily as needed. 10/12/12   Delmore Sear, PA-C  docusate sodium 100 MG CAPS Take 100 mg by mouth 2 (two) times daily. 10/12/12   Shruti Arrey Julien Girt, PA-C  ferrous sulfate 325 (65 FE) MG tablet Take 325 mg by mouth 2 (two) times daily.     Historical Provider, MD  methocarbamol (ROBAXIN) 500 MG tablet Take 1 tablet (500 mg total) by mouth every 6 (six) hours as needed. 10/12/12   Shaguana Love, PA-C  ondansetron (ZOFRAN) 4 MG tablet Take 1 tablet (4 mg total) by mouth every 6 (six) hours as needed for nausea. 10/12/12   Kyilee Gregg, PA-C  oxyCODONE (OXY IR/ROXICODONE) 5 MG immediate release tablet Take 1-2 tablets (5-10 mg total) by mouth every 3 (three) hours as needed. 10/12/12   Amaliya Whitelaw, PA-C  polyethylene glycol (MIRALAX / GLYCOLAX) packet Take 17 g by mouth daily as needed. 10/12/12   Ephraim Reichel Julien Girt, PA-C  promethazine (PHENERGAN) 25 MG tablet Take 0.5-1 tablets (12.5-25 mg total) by mouth every 6 (six) hours as needed for nausea. 10/12/12   Mirza Kidney Julien Girt, PA-C  tolterodine (DETROL LA) 4 MG 24 hr capsule Take 4 mg by mouth daily. 06/26/12   Horald Pollen, PA-C  traMADol (ULTRAM) 50 MG tablet Take 1-2 tablets (50-100 mg total) by mouth every 6 (six) hours as needed (mild pain). 10/12/12   Shanitra Phillippi Julien Girt, PA-C    Diet: Cardiac diet Activity:WBAT Follow-up:in 2 weeks Disposition - Skilled nursing facility - [facility] Discharged Condition: Pending at time of summary, will transfer if doing well.   Discharge Orders   Future Orders Complete By Expires     Call MD / Call 911  As directed     Comments:      If you experience chest pain or shortness of breath, CALL 911 and be transported to the hospital emergency  room.  If you develope a fever above 101 F, pus (white drainage) or increased drainage or redness at the wound, or calf pain, call your surgeon's office.    Change dressing  As directed     Comments:      Change dressing daily with sterile 4 x 4 inch gauze dressing and apply TED hose. Do not submerge the incision under water.    Constipation Prevention  As directed     Comments:      Drink plenty of fluids.  Prune juice may be helpful.  You may use a stool softener, such as Colace (over the counter) 100 mg twice a day.  Use MiraLax (over the counter) for constipation as needed.    Diet - low sodium heart healthy  As directed     Discharge instructions  As directed     Comments:      Pick up stool softner  and laxative for home. Do not submerge incision under water. May shower. Continue to use ice for pain and swelling from surgery.  Take 325 mg Aspirin daily for three weeks, then discontinue. Once the patient has completed the full dose 325 mg Aspirin, they may resume the 81 mg Aspirin.  When discharged from the skilled rehab facility, please have the facility set up the patient's Home Health Physical Therapy prior to being released.  Also provide the patient with their medications at time of release from the facility to include their pain medication, the muscle relaxants, and their blood thinner medication.  If the patient is still at the rehab facility at time of follow up appointment, please also assist the patient in arranging follow up appointment in our office and any transportation needs.    Do not put a pillow under the knee. Place it under the heel.  As directed     Do not sit on low chairs, stoools or toilet seats, as it may be difficult to get up from low surfaces  As directed     Driving restrictions  As directed     Comments:      No driving until released by the physician.    Increase activity slowly as tolerated  As directed     Lifting restrictions  As directed     Comments:       No lifting until released by the physician.    Patient may shower  As directed     Comments:      You may shower without a dressing once there is no drainage.  Do not wash over the wound.  If drainage remains, do not shower until drainage stops.    TED hose  As directed     Comments:      Use stockings (TED hose) for 3 weeks on both leg(s).  You may remove them at night for sleeping.    Weight bearing as tolerated  As directed         Medication List    STOP taking these medications       fish oil-omega-3 fatty acids 1000 MG capsule     GARLIC PO     HYDROcodone-acetaminophen 7.5-500 MG per tablet  Commonly known as:  LORTAB     LUTEIN-ZEAXANTHIN PO     multivitamin with minerals Tabs     oxaprozin 600 MG tablet  Commonly known as:  DAYPRO     PREMARIN vaginal cream  Generic drug:  conjugated estrogens     vitamin C 500 MG tablet  Commonly known as:  ASCORBIC ACID     Vitamin D 2000 UNITS Caps     Zinc 50 MG Tabs      TAKE these medications       ALPRAZolam 0.5 MG tablet  Commonly known as:  XANAX  Take 0.5-1 mg by mouth 3 (three) times daily as needed for sleep or anxiety. Take 1 in morning and 2 every evening for anxiety     aspirin 325 MG EC tablet  Take 1 tablet (325 mg total) by mouth 2 (two) times daily. Take 325 mg Aspirin daily for three weeks, then discontinue the 325 mg and resume the 81 mg Aspirin daily at that time.     bisacodyl 10 MG suppository  Commonly known as:  DULCOLAX  Place 1 suppository (10 mg total) rectally daily as needed.     DSS 100 MG Caps  Take 100 mg by mouth  2 (two) times daily.     esomeprazole 40 MG capsule  Commonly known as:  NEXIUM  Take 40 mg by mouth daily.     ferrous sulfate 325 (65 FE) MG tablet  Take 325 mg by mouth 2 (two) times daily.     gabapentin 300 MG capsule  Commonly known as:  NEURONTIN  Take 300 mg by mouth 2 (two) times daily.     levothyroxine 137 MCG tablet  Commonly known as:  SYNTHROID,  LEVOTHROID  Take 137 mcg by mouth daily before breakfast.     methocarbamol 500 MG tablet  Commonly known as:  ROBAXIN  Take 1 tablet (500 mg total) by mouth every 6 (six) hours as needed.     ondansetron 4 MG tablet  Commonly known as:  ZOFRAN  Take 1 tablet (4 mg total) by mouth every 6 (six) hours as needed for nausea.     oxyCODONE 5 MG immediate release tablet  Commonly known as:  Oxy IR/ROXICODONE  Take 1-2 tablets (5-10 mg total) by mouth every 3 (three) hours as needed.     polyethylene glycol packet  Commonly known as:  MIRALAX / GLYCOLAX  Take 17 g by mouth daily as needed.     promethazine 25 MG tablet  Commonly known as:  PHENERGAN  Take 0.5-1 tablets (12.5-25 mg total) by mouth every 6 (six) hours as needed for nausea.     simvastatin 20 MG tablet  Commonly known as:  ZOCOR  Take 20 mg by mouth every morning.     tolterodine 4 MG 24 hr capsule  Commonly known as:  DETROL LA  Take 4 mg by mouth daily.     traMADol 50 MG tablet  Commonly known as:  ULTRAM  Take 1-2 tablets (50-100 mg total) by mouth every 6 (six) hours as needed (mild pain).           Follow-up Information   Follow up with Loanne Drilling, MD. Schedule an appointment as soon as possible for a visit in 2 weeks.   Contact information:   [address] [place] [address] [phone]       Signed: Patrica Duel 10/12/2012, 8:54 AM

## 2012-10-12 NOTE — Progress Notes (Signed)
CSW assisting with d/c planning. Pt plans to have ST Rehab at [facility] when stable for d/c. CSW will continue to follow to assist with d/c planning needs.  Cori Razor LCSW [id number]

## 2012-10-12 NOTE — Op Note (Signed)
NAMECASHAY, [name] [name]                 ACCOUNT NO.:  [id number]  MEDICAL RECORD NO.:  [id number]  LOCATION:  [id number]                         FACILITY:  [facility]  PHYSICIAN:  Ollen Gross, M.D.    DATE OF BIRTH:  [birth year]  DATE OF PROCEDURE:  10/11/2012 DATE OF DISCHARGE:                              OPERATIVE REPORT   PREOPERATIVE DIAGNOSIS:  Postoperative hematoma, right knee.  POSTOPERATIVE DIAGNOSIS:  Postoperative hematoma, right knee.  PROCEDURE:  Irrigation and debridement, right knee with hematoma evacuation.  SURGEON:  Ollen Gross, M.D.  ASSISTANT:  No assistant.  ANESTHESIA:  General.  ESTIMATED BLOOD LOSS:  Minimal.  DRAINS:  None.  COMPLICATIONS:  None.  CONDITION:  Stable to recovery.  BRIEF CLINICAL NOTE:  [name] is an 79 year old female, who had a right total knee arthroplasty performed on Monday, 10/08/2012.  Upon evaluating her wound yesterday, she did have a fair amount of swelling. She had a hematoma and blisters today.  It was felt that she had a subcutaneous hematoma as well as deep hemarthrosis.  I felt that this would compromise wound healing and potentially put her at increased risk for infection.  We decided to perform a hematoma evacuation and I and D today.  It was felt that this hematoma was secondary to postoperative anticoagulation.  She presents now for the above-mentioned procedure.  PROCEDURE IN DETAIL:  After successful administration of general anesthetic, a tourniquet was placed high on the right thigh.  Right lower extremity was prepped and draped in the usual sterile fashion.  We did not utilize a tourniquet as I wanted to assess if there was any active bleeding.  A 10 blade was used to re-cut her previous incision. There was a large amount of subcutaneous hematoma, which was evacuated. Her medial arthrotomy closure was intact.  There was no evidence of any opening there.  I made a small lateral arthrotomy with a fresh 10 blade and  evacuated a large amount of intra-articular hematoma.  No active bleeding was identified.  We then used 3 L of saline solution with pulsatile lavage to washout the joint and the subcutaneous tissues.  At the completion, everything was clean, and there was no active bleeding noted.  I then closed the arthrotomy with interrupted #1 PDS suture.  Subcu was then closed with interrupted 2-0 Vicryl and subcuticular closed running 4-0 Monocryl.  We did not use Steri-Strips as she has had blisters associated with these in the past.  A bulky sterile dressing was applied, and she was awakened and transported to recovery in stable condition.     Ollen Gross, M.D.     FA/MEDQ  D:  10/11/2012  T:  10/12/2012  Job:  [id number]

## 2012-10-13 LAB — BASIC METABOLIC PANEL
CO2: 27 mEq/L (ref 19–32)
GFR calc non Af Amer: 79 mL/min — ABNORMAL LOW (ref >90)
Glucose, Bld: 110 mg/dL — ABNORMAL HIGH (ref 70–99)
Potassium: 3.7 mEq/L (ref 3.5–5.1)
Sodium: 130 mEq/L — ABNORMAL LOW (ref 135–145)

## 2012-10-13 LAB — CBC
Hemoglobin: 7.4 g/dL — ABNORMAL LOW (ref 12.0–15.0)
MCH: 30.1 pg (ref 26.0–34.0)
RBC: 2.46 MIL/uL — ABNORMAL LOW (ref 3.87–5.11)
WBC: 20.9 10*3/uL — ABNORMAL HIGH (ref 4.0–10.5)

## 2012-10-13 NOTE — Progress Notes (Signed)
Physical Therapy Treatment Patient Details Name: [name] [name] MRN: [id number] DOB: December 01, 1930 Today's Date: 10/13/2012 Time: 4098-1191 PT Time Calculation (min): 30 min  PT Assessment / Plan / Recommendation  PT Comments   Pt progressing well with therapy and is very motivated to return home after stay at [facility].  Pt to D/C today.   Follow Up Recommendations  SNF     Does the patient have the potential to tolerate intense rehabilitation     Barriers to Discharge        Equipment Recommendations  None recommended by PT    Recommendations for Other Services    Frequency 7X/week   Progress towards PT Goals Progress towards PT goals: Progressing toward goals  Plan Current plan remains appropriate    Precautions / Restrictions Precautions Precautions: Knee Precaution Comments: monitor HR,Sats, Required Braces or Orthoses: Knee Immobilizer - Right Restrictions Weight Bearing Restrictions: No Other Position/Activity Restrictions: WBAT   Pertinent Vitals/Pain 4/10, ice packs applied    Mobility  Bed Mobility Bed Mobility: Supine to Sit Supine to Sit: 4: Min assist;HOB elevated Details for Bed Mobility Assistance: Assist for RLE out of bed with min cues for technique.   Transfers Transfers: Sit to Stand;Stand to Sit Sit to Stand: 4: Min assist;From elevated surface;With upper extremity assist;From bed Stand to Sit: 4: Min assist;With upper extremity assist;With armrests;To chair/3-in-1 Details for Transfer Assistance: Performed x 2 to allow rest break with ambulation.  Cues for hand placement and LE management.   Ambulation/Gait Ambulation/Gait Assistance: 4: Min assist Ambulation Distance (Feet): 70 Feet (x2 reps with seated rest break in between. ) Assistive device: Rolling walker Ambulation/Gait Assistance Details: Assist to steady with cues for sequencing/technique with RW as she tends to step too far inside of RW and also takes small steps on RLE.  Note that pt  sounds SOB and wheezy during ambulation, therefore when pt sat to rest checked O2 sats and read 87%.  Provided cues for pursed lip breathing and pt able to increase sats to 96%.  RN notified.  Gait Pattern: Step-to pattern;Trunk flexed;Decreased step length - left;Decreased step length - right;Antalgic Gait velocity: decreased    Exercises     PT Diagnosis:    PT Problem List:   PT Treatment Interventions:     PT Goals (current goals can now be found in the care plan section) Acute Rehab PT Goals Patient Stated Goal: I want to be able to take care of myself before I go home PT Goal Formulation: With patient Time For Goal Achievement: 10/19/12 Potential to Achieve Goals: Good  Visit Information  Last PT Received On: 10/13/12 Assistance Needed: +1    Subjective Data  Subjective: I will work hard to get back to doing for myself.  Patient Stated Goal: I want to be able to take care of myself before I go home   Cognition  Cognition Arousal/Alertness: Awake/alert Behavior During Therapy: WFL for tasks assessed/performed Overall Cognitive Status: Within Functional Limits for tasks assessed    Balance     End of Session PT - End of Session Equipment Utilized During Treatment: Gait belt;Right knee immobilizer Activity Tolerance: Patient tolerated treatment well Patient left: in chair;with call bell/phone within reach Nurse Communication: Mobility status (O2 sats)   GP     [name] [name] 10/13/2012, 11:20 AM

## 2012-10-13 NOTE — Progress Notes (Signed)
Per MD, Pt ready for d/c.  Notified RN, Pt and facility.  Pt has notified family.  Admission arranged by Weekday CSW.  Facility ready to receive Pt, per Siri Cole, Environmental education officer at [place] [facility].  Arranged for transportation.  Providence Crosby, LCSWA Clinical Social Work [phone]

## 2012-10-13 NOTE — Progress Notes (Addendum)
Pt's upper right thigh is edematous and reddened this am.  Pt states "it's unchanged" but I did not note it on my earlier assessment.  Hgb decreased, WBC increased.   Will ask MD to assess.  Barnett Hatter P

## 2012-10-13 NOTE — Progress Notes (Signed)
Pt stable, scripts, and d/c instructions sent with [facility] and pt.  Pt transported via [facility] to [facility]

## 2012-10-13 NOTE — Progress Notes (Signed)
Report called to [facility]. 

## 2012-10-13 NOTE — Progress Notes (Signed)
   Subjective: 2 Days Post-Op Procedure(s) (LRB): IRRIGATION AND DEBRIDEMENT KNEE (Right)  Pt doing well Mild pain in right knee Therapy progressing  Patient reports pain as mild.  Objective:   VITALS:   Filed Vitals:   10/13/12 0620  BP: 111/59  Pulse: 74  Temp: 98.3 F (36.8 C)  Resp: 20    Right knee incision with healing bullous blisters  Dressing changed  No erythema Mild drainage from the blisters to anterior knee nv intact distally  LABS  Recent Labs  10/11/12 0450 10/12/12 0338 10/13/12 0530  HGB 8.1* 8.8* 7.4*  HCT 23.3* 25.4* 21.8*  WBC 24.4* 16.8* 20.9*  PLT 98* 100* 172     Recent Labs  10/11/12 0450 10/12/12 0338  NA 120* 126*  K 4.0 4.2  BUN 16 15  CREATININE 0.76 0.59  GLUCOSE 92 177*     Assessment/Plan: 2 Days Post-Op Procedure(s) (LRB): IRRIGATION AND DEBRIDEMENT KNEE (Right) Plan for d/c to snf today Anemia: asymptomatic today  Discharge to SNF   [facility], MPAS, PA-C  10/13/2012, 7:26 AM

## 2012-10-15 ENCOUNTER — Non-Acute Institutional Stay (SKILLED_NURSING_FACILITY): Payer: Medicare Other | Admitting: Internal Medicine

## 2012-10-15 DIAGNOSIS — E039 Hypothyroidism, unspecified: Secondary | ICD-10-CM

## 2012-10-15 DIAGNOSIS — M1710 Unilateral primary osteoarthritis, unspecified knee: Secondary | ICD-10-CM

## 2012-10-15 DIAGNOSIS — M25569 Pain in unspecified knee: Secondary | ICD-10-CM

## 2012-10-15 DIAGNOSIS — M25561 Pain in right knee: Secondary | ICD-10-CM

## 2012-10-15 DIAGNOSIS — I4891 Unspecified atrial fibrillation: Secondary | ICD-10-CM

## 2012-10-17 ENCOUNTER — Other Ambulatory Visit (HOSPITAL_COMMUNITY): Payer: Self-pay | Admitting: Orthopedic Surgery

## 2012-10-17 ENCOUNTER — Ambulatory Visit (HOSPITAL_COMMUNITY)
Admission: RE | Admit: 2012-10-17 | Discharge: 2012-10-17 | Disposition: A | Discharge status: Home / Self Care | Payer: Medicare Other | Source: Ambulatory Visit | Attending: Cardiovascular Disease | Admitting: Cardiovascular Disease

## 2012-10-17 DIAGNOSIS — M7989 Other specified soft tissue disorders: Secondary | ICD-10-CM | POA: Insufficient documentation

## 2012-10-17 DIAGNOSIS — M79609 Pain in unspecified limb: Secondary | ICD-10-CM

## 2012-10-17 DIAGNOSIS — Z96659 Presence of unspecified artificial knee joint: Secondary | ICD-10-CM | POA: Insufficient documentation

## 2012-10-17 NOTE — Progress Notes (Signed)
Right Lower Ext. Venous Completed. Negative for DVT. Marilynne Halsted, RDMS, RVT

## 2012-11-02 ENCOUNTER — Non-Acute Institutional Stay (SKILLED_NURSING_FACILITY): Payer: Medicare Other | Admitting: Adult Health

## 2012-11-02 DIAGNOSIS — R110 Nausea: Secondary | ICD-10-CM

## 2012-11-02 DIAGNOSIS — R52 Pain, unspecified: Secondary | ICD-10-CM

## 2012-11-05 ENCOUNTER — Non-Acute Institutional Stay (SKILLED_NURSING_FACILITY): Payer: Medicare Other | Admitting: Adult Health

## 2012-11-05 DIAGNOSIS — D72829 Elevated white blood cell count, unspecified: Secondary | ICD-10-CM

## 2012-11-05 DIAGNOSIS — M25569 Pain in unspecified knee: Secondary | ICD-10-CM

## 2012-11-05 DIAGNOSIS — E871 Hypo-osmolality and hyponatremia: Secondary | ICD-10-CM

## 2012-11-05 DIAGNOSIS — M25561 Pain in right knee: Secondary | ICD-10-CM

## 2012-11-05 DIAGNOSIS — T8140XA Infection following a procedure, unspecified, initial encounter: Secondary | ICD-10-CM

## 2012-11-12 ENCOUNTER — Non-Acute Institutional Stay (SKILLED_NURSING_FACILITY): Payer: Medicare Other | Admitting: Adult Health

## 2012-11-12 DIAGNOSIS — I10 Essential (primary) hypertension: Secondary | ICD-10-CM

## 2012-11-13 ENCOUNTER — Encounter: Payer: Self-pay | Admitting: Adult Health

## 2012-11-13 ENCOUNTER — Non-Acute Institutional Stay (SKILLED_NURSING_FACILITY): Payer: Medicare Other | Admitting: Adult Health

## 2012-11-13 DIAGNOSIS — R197 Diarrhea, unspecified: Secondary | ICD-10-CM

## 2012-11-13 DIAGNOSIS — D72829 Elevated white blood cell count, unspecified: Secondary | ICD-10-CM | POA: Insufficient documentation

## 2012-11-13 DIAGNOSIS — E039 Hypothyroidism, unspecified: Secondary | ICD-10-CM

## 2012-11-13 DIAGNOSIS — R52 Pain, unspecified: Secondary | ICD-10-CM | POA: Insufficient documentation

## 2012-11-13 DIAGNOSIS — T8149XA Infection following a procedure, other surgical site, initial encounter: Secondary | ICD-10-CM | POA: Insufficient documentation

## 2012-11-13 DIAGNOSIS — R110 Nausea: Secondary | ICD-10-CM | POA: Insufficient documentation

## 2012-11-13 DIAGNOSIS — M25561 Pain in right knee: Secondary | ICD-10-CM | POA: Insufficient documentation

## 2012-11-13 NOTE — Progress Notes (Signed)
Patient ID: [name] [name], female   DOB: 07/23/1930, 79 y.o.   MRN: [id number]       PROGRESS NOTE  DATE: 11/02/2012  FACILITY:  [facility]  LEVEL OF CARE: SNF (31)  Acute Visit  CHIEF COMPLAINT:  Manage Pain  HISTORY OF PRESENT ILLNESS: This is an 79 year old female who complains of pain on her right shoulder. Son reported that patient had a rotator cuff surgery in the past that has" gone bad" so the right upper extremity has not been used that much. Patient is requesting not to have therapy on the right upper extremity. Patient complains of being nauseous but has no episode of vomiting noted.   PAST MEDICAL HISTORY : Reviewed.  No changes.  CURRENT MEDICATIONS: Reviewed per [facility]  REVIEW OF SYSTEMS:  GENERAL: no change in appetite, no fatigue, no weight changes, no fever, chills or weakness RESPIRATORY: no cough, SOB, DOE,, wheezing, hemoptysis CARDIAC: no chest pain, edema or palpitations GI: no abdominal pain, diarrhea, constipation, heart burn, + nausea   PHYSICAL EXAMINATION  VS:  T 97.5        P 68       RR 20       BP 121/68      POX 95 %       WT 167.2 (Lb)  GENERAL: no acute distress, normal body habitus EYES: conjunctivae normal, sclerae normal, normal eye lids NECK: supple, trachea midline, no neck masses, no thyroid tenderness, no thyromegaly RESPIRATORY: breathing is even & unlabored, BS CTAB CARDIAC: RRR, no murmur,no extra heart sounds, no edema GI: abdomen soft, normal BS, no masses, no tenderness, no hepatomegaly, no splenomegaly EXTREMITIES:  Limited ROM on right shoulder PSYCHIATRIC: the patient is alert & oriented to person, affect & behavior appropriate  LABS/RADIOLOGY:  10/16/12 WBC 23.3 hemoglobin 7.9 hematocrit 23.8 sodium 131 potassium 4.2 glucose 93 BUN and creatinine 0.71 calcium 8.1  ASSESSMENT/PLAN:  Pain, right shoulder - hold therapy for today; hold therapy on her right upper extremity   Nausea - check CBC and BMP   CPT  CODE: 40981

## 2012-11-13 NOTE — Progress Notes (Signed)
Patient ID: [name] [name], female   DOB: 1930/07/04, 79 y.o.   MRN: [id number]       PROGRESS NOTE  DATE: 11/05/2012  FACILITY:  [facility]  LEVEL OF CARE: SNF (31)  Acute Visit  CHIEF COMPLAINT:  Manage Pain and Surgical Infection  HISTORY OF PRESENT ILLNESS: This is an 79 year old female who complains of moderate right knee pain. She recently had irrigation and debridement of right knee surgical wound. She is S/P right total knee arthroplasty on 10/08/12. Right knee surgical site is erythematous  and has slight serous drainage. Patient complains that whenever she takes OxyIR and OxyContin she has nightmares. Noted NA = 128 .   PAST MEDICAL HISTORY : Reviewed.  No changes.  CURRENT MEDICATIONS: Reviewed per [facility]  REVIEW OF SYSTEMS:  GENERAL: no change in appetite, no fatigue, no weight changes, no fever, chills or weakness RESPIRATORY: no cough, SOB, DOE,, wheezing, hemoptysis CARDIAC: no chest pain, edema or palpitations GI: no abdominal pain, diarrhea, constipation, heart burn, nausea or vomiting  PHYSICAL EXAMINATION  VS:  T 97        P 89       RR 20       BP 148/64      POX 96 %       WT 167.2 (Lb)  GENERAL: no acute distress, normal body habitus EYES: conjunctivae normal, sclerae normal, normal eye lids NECK: supple, trachea midline, no neck masses, no thyroid tenderness, no thyromegaly RESPIRATORY: breathing is even & unlabored, BS CTAB CARDIAC: RRR, no murmur,no extra heart sounds, no edema GI: abdomen soft, normal BS, no masses, no tenderness, no hepatomegaly, no splenomegaly PSYCHIATRIC: the patient is alert & oriented to person, affect & behavior appropriate  LABS/RADIOLOGY: 11/05/12 sodium 128 potassium 4.5 BUN 9 creatinine 0.7 cultures from 8.3 total protein 6.7 albumin 3.2 WBC 10.1 hemoglobin 8.8 hematocrit 28.6 11/02/12 WBC 14.2 hemoglobin 8.6 hematocrit 26.7 10/16/12 WBC 23.3 hemoglobin 7.9 hematocrit 23.8 sodium 131 potassium 4.2 glucose 93 BUN  and creatinine 0.71 calcium 8.1  ASSESSMENT/PLAN:  Surgical wound infection - continue doxycycline 100 mg by mouth twice a day x7 days; notify Dr. Lequita Halt - orthosurgeon regarding right knee redness and drainage  Leukocytosis - repeat CBC in one week  Hyponatremia - BMP on 11/08/12  Pain, right knee - tramadol 50 mg 1 tab by mouth every 8 hours; discontinue OxyIR and OxyContin   CPT CODE: 40981

## 2012-11-13 NOTE — Progress Notes (Signed)
Patient ID: [name] [name], female   DOB: [birth year], 79 y.o.   MRN: [id number]       PROGRESS NOTE  DATE: 11/13/2012  FACILITY:  [name]  LEVEL OF CARE: SNF (31)  Acute Visit  CHIEF COMPLAINT:  Manage hypothyroidism and diarrhea  HISTORY OF PRESENT ILLNESS: This is an 79 year old female who was noted to have TSH 5.3970 . No complaints of fatigue nor shortness of breath. Patient report that she had an episode of loose stools. Bowel sounds - normoactive.    PAST MEDICAL HISTORY : Reviewed.  No changes.  CURRENT MEDICATIONS: Reviewed per [facility]  REVIEW OF SYSTEMS:  GENERAL: no change in appetite, no fatigue, no weight changes, no fever, chills or weakness RESPIRATORY: no cough, SOB, DOE,, wheezing, hemoptysis CARDIAC: no chest pain, edema or palpitations GI: no abdominal pain, constipation, heart burn, nausea or vomiting, + diarrhea  PHYSICAL EXAMINATION  VS:  T 97.8        73 P       RR 18       BP 148/74            WT 162.2 (Lb)  GENERAL: no acute distress, normal body habitus NECK: supple, trachea midline, no neck masses, no thyroid tenderness, no thyromegaly LYMPHATICS: no LAN in the neck, no supraclavicular LAN RESPIRATORY: breathing is even & unlabored, BS CTAB CARDIAC: RRR, no murmur,no extra heart sounds, no edema GI: abdomen soft, normal BS, no masses, no tenderness, no hepatomegaly, no splenomegaly PSYCHIATRIC: the patient is alert & oriented to person, affect & behavior appropriate  LABS/RADIOLOGY: 11/13/12 TSH 5.3970 11/12/12 WBC 10.3 hemoglobin 9.3 hematocrit 31.2 11/08/12 sodium 129 potassium 4.4 BUN 4 creatinine 0.7 calcium 8.7 11/05/12 sodium 128 potassium 4.5 BUN 9 creatinine 0.7 cultures from 8.3 total protein 6.7 albumin 3.2 WBC 10.1 hemoglobin 8.8 hematocrit 28.6 11/02/12 WBC 14.2 hemoglobin 8.6 hematocrit 26.7 10/16/12 WBC 23.3 hemoglobin 7.9 hematocrit 23.8 sodium 131 potassium 4.2 glucose 93 BUN and creatinine 0.71 calcium  8.1  ASSESSMENT/PLAN:  Hypothyroidism - increase Synthroid to 150 mcg 1 tablet by mouth daily; TSH in 6 weeks  Diarrhea - stool culture for c-difficile  CPT CODE: 40981

## 2012-11-13 NOTE — Progress Notes (Signed)
Patient ID: [name] [name], female   DOB: 1931/02/19, 79 y.o.   MRN: [id number]       PROGRESS NOTE  DATE: 11/12/2012  FACILITY:  [facility]  LEVEL OF CARE: SNF (31)  Acute Visit  CHIEF COMPLAINT:  Manage Hypertension  HISTORY OF PRESENT ILLNESS: This is an 79 year old female was noted to have blood pressure 150/76. No complaints of dizziness nor headache. Patient is alert and oriented. She verbalized not wanting to be put on any medications for hypertension on a routine basis since she's not to take it.  PAST MEDICAL HISTORY : Reviewed.  No changes.  CURRENT MEDICATIONS: Reviewed per [facility]  REVIEW OF SYSTEMS:  GENERAL: no change in appetite, no fatigue, no weight changes, no fever, chills or weakness RESPIRATORY: no cough, SOB, DOE, wheezing, hemoptysis CARDIAC: no chest pain, edema or palpitations GI: no abdominal pain, diarrhea, constipation, heart burn, nausea or vomiting  PHYSICAL EXAMINATION  VS:  T 99        P 82       RR 20       B 150/76 P      POX 95 %       WT 162.2 (Lb)  GENERAL: no acute distress, normal body habitus EYES: conjunctivae normal, sclerae normal, normal eye lids RESPIRATORY: breathing is even & unlabored, BS CTAB CARDIAC: RRR, no murmur,no extra heart sounds, no edema GI: abdomen soft, normal BS, no masses, no tenderness, no hepatomegaly, no splenomegaly PSYCHIATRIC: the patient is alert & oriented to person, affect & behavior appropriate  LABS/RADIOLOGY: 11/05/12 sodium 128 potassium 4.5 BUN 9 creatinine 0.7 cultures from 8.3 total protein 6.7 albumin 3.2 WBC 10.1 hemoglobin 8.8 hematocrit 28.6 11/02/12 WBC 14.2 hemoglobin 8.6 hematocrit 26.7 10/16/12 WBC 23.3 hemoglobin 7.9 hematocrit 23.8 sodium 131 potassium 4.2 glucose 93 BUN and creatinine 0.71 calcium 8.1  ASSESSMENT/PLAN:  Hypertension - start clonidine 0.1 mg one tablet by mouth every 6 hours when necessary for systolic BP greater than 160; BP every shift x1 week  CPT CODE:  40981

## 2012-11-14 NOTE — Progress Notes (Signed)
Patient ID: [name] [name], female   DOB: 03/30/1930, 79 y.o.   MRN: [id number]        HISTORY & PHYSICAL  DATE: 10/15/2012   FACILITY: [facility]  LEVEL OF CARE: SNF (31)  ALLERGIES:  Allergies  Allergen Reactions  . Omeprazole Nausea And Vomiting  . Penicillins Itching  . Morphine Rash    And hot flashes  . Xarelto [Rivaroxaban] Other (See Comments)    Bleeding,spitting up blood    CHIEF COMPLAINT:  Manage right knee osteoarthritis, atrial fibrillation, and hypothyroidism.    HISTORY OF PRESENT ILLNESS:   The patient is an 79 year-old, Caucasian female.   KNEE OSTEOARTHRITIS: Patient had a history of pain and functional disability in the knee due to end-stage osteoarthritis and has failed nonsurgical conservative treatments. Patient had worsening of pain with activity and weight bearing, pain that interfered with activities of daily living & pain with passive range of motion. Radiographs showed bone-on-bone arthritis in all three compartments.  Patient underwent irrigation and debridement and then subsequently a total knee arthroplasty and tolerated the procedure well. Patient is admitted to this facility for sort short-term rehabilitation. Patient is complaining of ongoing right knee pain.    ATRIAL FIBRILLATION: the patients atrial fibrillation remains stable.  The patient denies DOE, tachycardia, orthopnea, transient neurological sx, pedal edema, palpitations, & PNDs.  No complications noted from the medications currently being used.   HYPOTHYROIDISM: The hypothyroidism remains stable. No complications noted from the medications presently being used.  The patient denies fatigue or constipation.  Last TSH:  Not available.  PAST MEDICAL HISTORY :  Past Medical History  Diagnosis Date  . Mixed hyperlipidemia   . Hypothyroidism   . GERD (gastroesophageal reflux disease)   . Atrial fibrillation     Remote amiodarone use, recurred in 07/2011 post op from knee  arthroplasty  . Obese   . Asymptomatic cholelithiasis   . History of blood transfusion 08/31/2011    A+ type  . History of GI bleed     Gastritis, was on Xarelto and ASA  . Carpal tunnel syndrome   . Arthritis     Knees  . DJD (degenerative joint disease), thoracolumbar   . Rheumatoid arthritis   . H/O bronchitis   . Hemorrhoids   . Urinary incontinence   . Measles     as child  . Mumps     as child  . Carotid artery disease   . PONV (postoperative nausea and vomiting)     PAST SURGICAL HISTORY: Past Surgical History  Procedure Laterality Date  . Tonsillectomy    . Cholecystectomy  04/29/2005    Dr. Luretha Murphy  . Thoracotomy  4/07    Removal bronchogenic cyst  . Total knee arthroplasty  08/10/2011    Procedure: TOTAL KNEE ARTHROPLASTY;  Surgeon: Loanne Drilling, MD;  Location: [facility];  Service: Orthopedics;  Laterality: Left;  . Rotator cuff repair  1992, 1993    Bilateral  . Tubal ligation      Bilateral  . Ventral hernia repair  05/25/2005    Incarcerated, lysis of adhesions, Dr. Luretha Murphy  . Appendectomy      as child  . Bronchogenic cyst  2007  . Total knee arthroplasty Right 10/08/2012    Procedure: RIGHT TOTAL KNEE ARTHROPLASTY;  Surgeon: Loanne Drilling, MD;  Location: [facility];  Service: Orthopedics;  Laterality: Right;  . Irrigation and debridement knee Right 10/11/2012    Procedure:  IRRIGATION AND DEBRIDEMENT KNEE;  Surgeon: Loanne Drilling, MD;  Location: [facility];  Service: Orthopedics;  Laterality: Right;    SOCIAL HISTORY:  reports that she has never smoked. She has never used smokeless tobacco. She reports that she does not drink alcohol or use illicit drugs.  FAMILY HISTORY:  Family History  Problem Relation Age of Onset  . Cancer    . Stroke    . Diabetes    . Hypertension    . Colon cancer Neg Hx   . Colon polyps Neg Hx   . Rectal cancer Neg Hx   . Stomach cancer Neg Hx     CURRENT MEDICATIONS: Reviewed per [facility]  REVIEW OF SYSTEMS:  See  HPI otherwise 14 point ROS is negative.  PHYSICAL EXAMINATION  VS:  T 99.4       P 82      RR 17      BP 135/62      POX%        WT (Lb)  GENERAL: no acute distress, normal body habitus EYES: conjunctivae normal, sclerae normal, normal eye lids MOUTH/THROAT: lips without lesions,no lesions in the mouth,tongue is without lesions,uvula elevates in midline NECK: supple, trachea midline, no neck masses, no thyroid tenderness, no thyromegaly LYMPHATICS: no LAN in the neck, no supraclavicular LAN RESPIRATORY: breathing is even & unlabored, BS CTAB CARDIAC: RRR, no murmur,no extra heart sounds EDEMA/VARICOSITIES: right lower extremity has +1 edema GI:  ABDOMEN: abdomen soft, normal BS, no masses, no tenderness  LIVER/SPLEEN: no hepatomegaly, no splenomegaly MUSCULOSKELETAL: HEAD: normal to inspection & palpation BACK: no kyphosis, scoliosis or spinal processes tenderness EXTREMITIES: LEFT UPPER EXTREMITY: full range of motion, normal strength & tone RIGHT UPPER EXTREMITY:  full range of motion, normal strength & tone LEFT LOWER EXTREMITY:  full range of motion, normal strength & tone RIGHT LOWER EXTREMITY: strength intact, range of motion not tested due to surgery   PSYCHIATRIC: the patient is alert & oriented to person, affect & behavior appropriate  LABS/RADIOLOGY: WBC 25.7, hemoglobin 8.3, MCV 88.5, platelets 167.    Glucose 160, otherwise BMP normal.    MRSA by PCR positive.    Staph aureus by PCR positive.    PTT 32, PT 13.4, INR 1.04.    Liver profile normal.    Urinalysis negative.    Chest x-ray:  No acute disease.    Right foot x-ray did not show acute fracture.    ASSESSMENT/PLAN:  Right knee osteoarthritis.  Status post right total knee arthroplasty.  Continue rehabilitation.    Atrial fibrillation.  Rate controlled.    Hypothyroidism.  Continue levothyroxine.    Right knee pain.  Uncontrolled problem.  Start OxyContin 10 mg q.12 and oxycodone 5 mg q.4 p.r.n.     Hypertension.  Well controlled.    Acute blood loss anemia.  Reassess.  Continue iron.    Urinary incontinence.  Continue Detrol LA.    Check CBC and BMP.    I have reviewed patient's medical records received at admission/from hospitalization.  CPT CODE: 16109

## 2012-11-21 ENCOUNTER — Encounter: Payer: Self-pay | Admitting: Adult Health

## 2012-11-23 DIAGNOSIS — I4891 Unspecified atrial fibrillation: Secondary | ICD-10-CM

## 2012-11-23 DIAGNOSIS — M159 Polyosteoarthritis, unspecified: Secondary | ICD-10-CM

## 2012-11-23 DIAGNOSIS — M069 Rheumatoid arthritis, unspecified: Secondary | ICD-10-CM

## 2012-11-23 DIAGNOSIS — T8189XA Other complications of procedures, not elsewhere classified, initial encounter: Secondary | ICD-10-CM

## 2012-11-26 ENCOUNTER — Inpatient Hospital Stay (HOSPITAL_COMMUNITY)
Admission: AD | Admit: 2012-11-26 | Discharge: 2012-12-03 | DRG: 920 | Disposition: A | Discharge status: Home Health | Payer: Medicare Other | Source: Other Acute Inpatient Hospital | Attending: Internal Medicine | Admitting: Internal Medicine

## 2012-11-26 DIAGNOSIS — E669 Obesity, unspecified: Secondary | ICD-10-CM | POA: Diagnosis present

## 2012-11-26 DIAGNOSIS — T8149XA Infection following a procedure, other surgical site, initial encounter: Secondary | ICD-10-CM

## 2012-11-26 DIAGNOSIS — E782 Mixed hyperlipidemia: Secondary | ICD-10-CM | POA: Diagnosis present

## 2012-11-26 DIAGNOSIS — Z7982 Long term (current) use of aspirin: Secondary | ICD-10-CM

## 2012-11-26 DIAGNOSIS — I493 Ventricular premature depolarization: Secondary | ICD-10-CM

## 2012-11-26 DIAGNOSIS — R110 Nausea: Secondary | ICD-10-CM

## 2012-11-26 DIAGNOSIS — M179 Osteoarthritis of knee, unspecified: Secondary | ICD-10-CM

## 2012-11-26 DIAGNOSIS — W010XXA Fall on same level from slipping, tripping and stumbling without subsequent striking against object, initial encounter: Secondary | ICD-10-CM | POA: Diagnosis present

## 2012-11-26 DIAGNOSIS — M25561 Pain in right knee: Secondary | ICD-10-CM

## 2012-11-26 DIAGNOSIS — E871 Hypo-osmolality and hyponatremia: Secondary | ICD-10-CM

## 2012-11-26 DIAGNOSIS — E785 Hyperlipidemia, unspecified: Secondary | ICD-10-CM

## 2012-11-26 DIAGNOSIS — IMO0002 Reserved for concepts with insufficient information to code with codable children: Secondary | ICD-10-CM

## 2012-11-26 DIAGNOSIS — R509 Fever, unspecified: Secondary | ICD-10-CM | POA: Diagnosis present

## 2012-11-26 DIAGNOSIS — D638 Anemia in other chronic diseases classified elsewhere: Secondary | ICD-10-CM | POA: Diagnosis present

## 2012-11-26 DIAGNOSIS — D62 Acute posthemorrhagic anemia: Secondary | ICD-10-CM

## 2012-11-26 DIAGNOSIS — D509 Iron deficiency anemia, unspecified: Secondary | ICD-10-CM | POA: Diagnosis present

## 2012-11-26 DIAGNOSIS — M069 Rheumatoid arthritis, unspecified: Secondary | ICD-10-CM | POA: Diagnosis present

## 2012-11-26 DIAGNOSIS — IMO0001 Reserved for inherently not codable concepts without codable children: Secondary | ICD-10-CM

## 2012-11-26 DIAGNOSIS — M1990 Unspecified osteoarthritis, unspecified site: Secondary | ICD-10-CM | POA: Diagnosis present

## 2012-11-26 DIAGNOSIS — Z79899 Other long term (current) drug therapy: Secondary | ICD-10-CM

## 2012-11-26 DIAGNOSIS — I4949 Other premature depolarization: Secondary | ICD-10-CM | POA: Diagnosis present

## 2012-11-26 DIAGNOSIS — K219 Gastro-esophageal reflux disease without esophagitis: Secondary | ICD-10-CM | POA: Diagnosis present

## 2012-11-26 DIAGNOSIS — I10 Essential (primary) hypertension: Secondary | ICD-10-CM

## 2012-11-26 DIAGNOSIS — M47817 Spondylosis without myelopathy or radiculopathy, lumbosacral region: Secondary | ICD-10-CM | POA: Diagnosis present

## 2012-11-26 DIAGNOSIS — M25569 Pain in unspecified knee: Secondary | ICD-10-CM | POA: Diagnosis present

## 2012-11-26 DIAGNOSIS — Z96659 Presence of unspecified artificial knee joint: Secondary | ICD-10-CM

## 2012-11-26 DIAGNOSIS — D72829 Elevated white blood cell count, unspecified: Secondary | ICD-10-CM

## 2012-11-26 DIAGNOSIS — R9389 Abnormal findings on diagnostic imaging of other specified body structures: Secondary | ICD-10-CM

## 2012-11-26 DIAGNOSIS — R52 Pain, unspecified: Secondary | ICD-10-CM

## 2012-11-26 DIAGNOSIS — R5381 Other malaise: Secondary | ICD-10-CM | POA: Diagnosis present

## 2012-11-26 DIAGNOSIS — I6529 Occlusion and stenosis of unspecified carotid artery: Secondary | ICD-10-CM

## 2012-11-26 DIAGNOSIS — Y831 Surgical operation with implant of artificial internal device as the cause of abnormal reaction of the patient, or of later complication, without mention of misadventure at the time of the procedure: Secondary | ICD-10-CM | POA: Diagnosis present

## 2012-11-26 DIAGNOSIS — R32 Unspecified urinary incontinence: Secondary | ICD-10-CM | POA: Diagnosis present

## 2012-11-26 DIAGNOSIS — Z9289 Personal history of other medical treatment: Secondary | ICD-10-CM

## 2012-11-26 DIAGNOSIS — E039 Hypothyroidism, unspecified: Secondary | ICD-10-CM

## 2012-11-26 DIAGNOSIS — T8140XA Infection following a procedure, unspecified, initial encounter: Secondary | ICD-10-CM | POA: Diagnosis present

## 2012-11-26 DIAGNOSIS — I2510 Atherosclerotic heart disease of native coronary artery without angina pectoris: Secondary | ICD-10-CM | POA: Diagnosis present

## 2012-11-26 DIAGNOSIS — M1710 Unilateral primary osteoarthritis, unspecified knee: Secondary | ICD-10-CM

## 2012-11-26 DIAGNOSIS — I4891 Unspecified atrial fibrillation: Secondary | ICD-10-CM

## 2012-11-26 DIAGNOSIS — R197 Diarrhea, unspecified: Secondary | ICD-10-CM

## 2012-11-26 DIAGNOSIS — N3289 Other specified disorders of bladder: Secondary | ICD-10-CM | POA: Diagnosis present

## 2012-11-26 DIAGNOSIS — Z6825 Body mass index (BMI) 25.0-25.9, adult: Secondary | ICD-10-CM

## 2012-11-26 DIAGNOSIS — M4120 Other idiopathic scoliosis, site unspecified: Secondary | ICD-10-CM | POA: Diagnosis present

## 2012-11-26 DIAGNOSIS — T8131XA Disruption of external operation (surgical) wound, not elsewhere classified, initial encounter: Principal | ICD-10-CM | POA: Diagnosis present

## 2012-11-26 MED ORDER — SIMVASTATIN 20 MG PO TABS
20.0000 mg | ORAL_TABLET | Freq: Every day | ORAL | Status: DC
  Administered 2012-11-27 – 2012-12-02 (×5): 20 mg via ORAL
  Filled 2012-11-26 (×7): qty 1

## 2012-11-26 MED ORDER — ONDANSETRON HCL 4 MG PO TABS
4.0000 mg | ORAL_TABLET | Freq: Four times a day (QID) | ORAL | Status: DC | PRN
  Administered 2012-11-29 (×3): 4 mg via ORAL
  Filled 2012-11-26 (×3): qty 1

## 2012-11-26 MED ORDER — FERROUS SULFATE 325 (65 FE) MG PO TABS
325.0000 mg | ORAL_TABLET | Freq: Two times a day (BID) | ORAL | Status: DC
  Administered 2012-11-27 – 2012-12-03 (×12): 325 mg via ORAL
  Filled 2012-11-26 (×15): qty 1

## 2012-11-26 MED ORDER — ACETAMINOPHEN 325 MG PO TABS
650.0000 mg | ORAL_TABLET | Freq: Four times a day (QID) | ORAL | Status: DC | PRN
  Administered 2012-11-29 – 2012-12-02 (×4): 650 mg via ORAL
  Filled 2012-11-26 (×5): qty 2

## 2012-11-26 MED ORDER — ONDANSETRON HCL 4 MG/2ML IJ SOLN
4.0000 mg | Freq: Four times a day (QID) | INTRAMUSCULAR | Status: DC | PRN
  Administered 2012-11-27 – 2012-11-28 (×2): 4 mg via INTRAVENOUS
  Filled 2012-11-26 (×2): qty 2

## 2012-11-26 MED ORDER — ONDANSETRON HCL 4 MG PO TABS: 4.0000 mg | ORAL_TABLET | Freq: Four times a day (QID) | ORAL | Status: DC | PRN

## 2012-11-26 MED ORDER — DOCUSATE SODIUM 100 MG PO CAPS
100.0000 mg | ORAL_CAPSULE | Freq: Two times a day (BID) | ORAL | Status: DC
  Administered 2012-11-27 – 2012-12-03 (×12): 100 mg via ORAL
  Filled 2012-11-26 (×5): qty 1

## 2012-11-26 MED ORDER — ALPRAZOLAM 0.5 MG PO TABS
0.5000 mg | ORAL_TABLET | Freq: Three times a day (TID) | ORAL | Status: DC | PRN
  Administered 2012-11-27 – 2012-11-28 (×3): 0.5 mg via ORAL
  Administered 2012-11-29: 1 mg via ORAL
  Administered 2012-11-30: 0.5 mg via ORAL
  Administered 2012-11-30 – 2012-12-02 (×2): 1 mg via ORAL
  Filled 2012-11-26: qty 2
  Filled 2012-11-26 (×3): qty 1
  Filled 2012-11-26: qty 2
  Filled 2012-11-26 (×2): qty 1
  Filled 2012-11-26: qty 2

## 2012-11-26 MED ORDER — POLYETHYLENE GLYCOL 3350 17 G PO PACK
17.0000 g | PACK | Freq: Every day | ORAL | Status: DC | PRN
  Administered 2012-12-02: 17 g via ORAL
  Filled 2012-11-26: qty 1

## 2012-11-26 MED ORDER — LEVOTHYROXINE SODIUM 150 MCG PO TABS
150.0000 ug | ORAL_TABLET | Freq: Every day | ORAL | Status: DC
  Administered 2012-11-27 – 2012-12-03 (×7): 150 ug via ORAL
  Filled 2012-11-26 (×8): qty 1

## 2012-11-26 MED ORDER — PROMETHAZINE HCL 25 MG PO TABS: 12.5000 mg | ORAL_TABLET | Freq: Four times a day (QID) | ORAL | Status: DC | PRN

## 2012-11-26 MED ORDER — ACETAMINOPHEN 650 MG RE SUPP: 650.0000 mg | Freq: Four times a day (QID) | RECTAL | Status: DC | PRN

## 2012-11-26 MED ORDER — DEXTROSE-NACL 5-0.9 % IV SOLN
INTRAVENOUS | Status: DC
  Administered 2012-11-26 – 2012-11-28 (×2): via INTRAVENOUS

## 2012-11-26 MED ORDER — TRAMADOL HCL 50 MG PO TABS
50.0000 mg | ORAL_TABLET | Freq: Four times a day (QID) | ORAL | Status: DC | PRN
  Administered 2012-11-27 – 2012-11-29 (×4): 100 mg via ORAL
  Administered 2012-11-29: 50 mg via ORAL
  Administered 2012-11-30 – 2012-12-03 (×8): 100 mg via ORAL
  Filled 2012-11-26 (×2): qty 2
  Filled 2012-11-26: qty 1
  Filled 2012-11-26 (×10): qty 2

## 2012-11-26 MED ORDER — BISACODYL 10 MG RE SUPP: 10.0000 mg | Freq: Every day | RECTAL | Status: DC | PRN

## 2012-11-26 MED ORDER — PANTOPRAZOLE SODIUM 40 MG PO TBEC
40.0000 mg | DELAYED_RELEASE_TABLET | Freq: Every day | ORAL | Status: DC
  Administered 2012-11-27 – 2012-12-01 (×4): 40 mg via ORAL
  Filled 2012-11-26 (×5): qty 1

## 2012-11-26 MED ORDER — GABAPENTIN 300 MG PO CAPS
300.0000 mg | ORAL_CAPSULE | Freq: Two times a day (BID) | ORAL | Status: DC
  Administered 2012-11-27 – 2012-12-03 (×12): 300 mg via ORAL
  Filled 2012-11-26 (×16): qty 1

## 2012-11-26 MED ORDER — METHOCARBAMOL 500 MG PO TABS
500.0000 mg | ORAL_TABLET | Freq: Four times a day (QID) | ORAL | Status: DC | PRN
  Administered 2012-11-27 – 2012-12-02 (×8): 500 mg via ORAL
  Filled 2012-11-26 (×10): qty 1

## 2012-11-26 MED ORDER — ASPIRIN EC 325 MG PO TBEC
325.0000 mg | DELAYED_RELEASE_TABLET | Freq: Every day | ORAL | Status: DC
  Administered 2012-11-27 – 2012-12-03 (×6): 325 mg via ORAL
  Filled 2012-11-26 (×7): qty 1

## 2012-11-26 MED ORDER — OXYBUTYNIN CHLORIDE ER 10 MG PO TB24
10.0000 mg | ORAL_TABLET | Freq: Every day | ORAL | Status: DC
  Administered 2012-11-27 – 2012-12-03 (×6): 10 mg via ORAL
  Filled 2012-11-26 (×7): qty 1

## 2012-11-26 NOTE — H&P (Addendum)
[facility] History and Physical  [name] [name] ION:[id number] DOB: [birth year] DOA: 11/26/2012  Referring physician:   Accepted by Zannie Cove PCP:  Karlene Einstein, MD   Chief Complaint:  Right knee pain  HPI:  The patient is a 79 y.o. year-old female with history of CAD, atrial fibrillation not on anticoagulation due to GIB, hypothyroidism, arthritis, who underwent right total knee arthroplasty by Dr. Lequita Halt on 10/08/12.  Her post-operative course was complicated by post-operative hematoma requiring I&D on 7/17.  She was discharged on doxycycline to rehab and subsequently had wound dehiscence and has been treated with wet to dry dressing.  She was discharged to home a few days ago, but developed increased weakness and had a fall the night prior to admission.  She had lower back pain and pain in the right upper leg and knee post-fall.  The home health nurse noticed her knee was appearing more erythematous and she was referred to the ER.  She was seen at [facility] ER where it was noted her right knee was red, swollen and painful.  WBC 20.7, hgb 9.3, plt 386, Na 120, K 3.6, chloride 88, bicarb 23, BUN 11, cr 0.76.  XR lumbar spine:  Severe degenerative changes and scoliosis, no acute findings.  XR right femur: No acute bony abnormality, moderate joint effusion of the right knee.  She was accepted in transfer to [facility] for orthopedics evaluation.  The patient is currently somewhat somnolent and not able to answer questions.  Patient endorses chills.  History obtained from charts.    Review of Systems:  Unable to obtain due to somnolence.  Answers "I don't know" to most questions.      Past Medical History  Diagnosis Date  . Mixed hyperlipidemia   . Hypothyroidism   . GERD (gastroesophageal reflux disease)   . Atrial fibrillation     Remote amiodarone use, recurred in 07/2011 post op from knee arthroplasty  . Obese   . Asymptomatic cholelithiasis   . History of blood transfusion  08/31/2011    A+ type  . History of GI bleed     Gastritis, was on Xarelto and ASA  . Carpal tunnel syndrome   . Arthritis     Knees  . DJD (degenerative joint disease), thoracolumbar   . Rheumatoid arthritis   . H/O bronchitis   . Hemorrhoids   . Urinary incontinence   . Measles     as child  . Mumps     as child  . Carotid artery disease   . PONV (postoperative nausea and vomiting)    Past Surgical History  Procedure Laterality Date  . Tonsillectomy    . Cholecystectomy  04/29/2005    Dr. Luretha Murphy  . Thoracotomy  4/07    Removal bronchogenic cyst  . Total knee arthroplasty  08/10/2011    Procedure: TOTAL KNEE ARTHROPLASTY;  Surgeon: Loanne Drilling, MD;  Location: [facility];  Service: Orthopedics;  Laterality: Left;  . Rotator cuff repair  1992, 1993    Bilateral  . Tubal ligation      Bilateral  . Ventral hernia repair  05/25/2005    Incarcerated, lysis of adhesions, Dr. Luretha Murphy  . Appendectomy      as child  . Bronchogenic cyst  2007  . Total knee arthroplasty Right 10/08/2012    Procedure: RIGHT TOTAL KNEE ARTHROPLASTY;  Surgeon: Loanne Drilling, MD;  Location: [facility];  Service: Orthopedics;  Laterality: Right;  . Irrigation and debridement knee  Right 10/11/2012    Procedure: IRRIGATION AND DEBRIDEMENT KNEE;  Surgeon: Loanne Drilling, MD;  Location: [facility];  Service: Orthopedics;  Laterality: Right;   Social History:  reports that she has never smoked. She has never used smokeless tobacco. She reports that she does not drink alcohol or use illicit drugs.   Allergies  Allergen Reactions  . Omeprazole Nausea And Vomiting  . Penicillins Itching  . Morphine Rash    And hot flashes  . Xarelto [Rivaroxaban] Other (See Comments)    Bleeding,spitting up blood    Family History  Problem Relation Age of Onset  . Cancer    . Stroke    . Diabetes    . Hypertension    . Colon cancer Neg Hx   . Colon polyps Neg Hx   . Rectal cancer Neg Hx   . Stomach cancer  Neg Hx      Prior to Admission medications   Medication Sig Start Date End Date Taking? Authorizing Provider  ALPRAZolam Prudy Feeler) 0.5 MG tablet Take 0.5-1 mg by mouth 3 (three) times daily as needed for sleep or anxiety. Take 1 in morning and 2 every evening for anxiety 06/26/12  Yes Horald Pollen, PA-C  aspirin EC 325 MG EC tablet Take 1 tablet (325 mg total) by mouth 2 (two) times daily. Take 325 mg Aspirin daily for three weeks, then discontinue the 325 mg and resume the 81 mg Aspirin daily at that time. 10/12/12  Yes Alexzandrew Julien Girt, PA-C  bisacodyl (DULCOLAX) 10 MG suppository Place 1 suppository (10 mg total) rectally daily as needed. 10/12/12  Yes Alexzandrew Julien Girt, PA-C  docusate sodium 100 MG CAPS Take 100 mg by mouth 2 (two) times daily. 10/12/12  Yes Alexzandrew Julien Girt, PA-C  doxycycline (ADOXA) 100 MG tablet Take 100 mg by mouth 2 (two) times daily.   Yes Historical Provider, MD  esomeprazole (NEXIUM) 40 MG capsule Take 40 mg by mouth daily. 06/26/12  Yes Horald Pollen, PA-C  ferrous sulfate 325 (65 FE) MG tablet Take 325 mg by mouth 2 (two) times daily.    Yes Historical Provider, MD  gabapentin (NEURONTIN) 300 MG capsule Take 300 mg by mouth 2 (two) times daily. 06/26/12  Yes Horald Pollen, PA-C  levothyroxine (SYNTHROID, LEVOTHROID) 150 MCG tablet Take 150 mcg by mouth daily before breakfast.   Yes Historical Provider, MD  ondansetron (ZOFRAN) 4 MG tablet Take 1 tablet (4 mg total) by mouth every 6 (six) hours as needed for nausea. 10/12/12  Yes Alexzandrew Julien Girt, PA-C  oxybutynin (DITROPAN-XL) 10 MG 24 hr tablet Take 10 mg by mouth daily.   Yes Historical Provider, MD  promethazine (PHENERGAN) 25 MG tablet Take 0.5-1 tablets (12.5-25 mg total) by mouth every 6 (six) hours as needed for nausea. 10/12/12  Yes Alexzandrew Julien Girt, PA-C  simvastatin (ZOCOR) 20 MG tablet Take 20 mg by mouth every morning. 06/26/12  Yes Horald Pollen, PA-C  traMADol (ULTRAM) 50 MG tablet Take 1-2 tablets  (50-100 mg total) by mouth every 6 (six) hours as needed (mild pain). 10/12/12  Yes Alexzandrew Julien Girt, PA-C  methocarbamol (ROBAXIN) 500 MG tablet Take 1 tablet (500 mg total) by mouth every 6 (six) hours as needed. 10/12/12   Alexzandrew Perkins, PA-C  polyethylene glycol (MIRALAX / GLYCOLAX) packet Take 17 g by mouth daily as needed. 10/12/12   Alexzandrew Julien Girt, PA-C   Physical Exam: Filed Vitals:   11/26/12 2145  BP: 146/68  Pulse: 75  Temp: 99  F (37.2 C)  TempSrc: Oral  Resp: 16  SpO2: 96%     General:  CF, no acute distress, sweating profusely from forehead, face, even hands  Eyes:  PERRL, anicteric, non-injected.  ENT:  Nares clear.  OP clear, non-erythematous without plaques or exudates.  MMM.  Neck:  Supple without TM or JVD.    Lymph:  No cervical, supraclavicular, or submandibular LAD.  Cardiovascular:  RRR, normal S1, S2, without m/r/g.  2+ pulses, warm extremities  Respiratory:  CTA bilaterally without increased WOB.  Abdomen:  NABS.  Soft, ND/NT.    Skin:  No rashes or focal lesions.  Musculoskeletal:  Normal bulk and tone.  Right knee with vertical incision with surrounding erythema and induration.  Wet dressing removed from lower incision with some purulence and serosanguinous drainage.  Very swollen and TTP.  Only able to range a few degrees due to pain.    Psychiatric:  A & O x 4.  Appropriate affect.  Neurologic:  CN 3-12 intact.  5/5 strength.  Sensation intact.  Labs on Admission:  Basic Metabolic Panel: No results found for this basename: NA, K, CL, CO2, GLUCOSE, BUN, CREATININE, CALCIUM, MG, PHOS,  in the last 168 hours Liver Function Tests: No results found for this basename: AST, ALT, ALKPHOS, BILITOT, PROT, ALBUMIN,  in the last 168 hours No results found for this basename: LIPASE, AMYLASE,  in the last 168 hours No results found for this basename: AMMONIA,  in the last 168 hours CBC: No results found for this basename: WBC, NEUTROABS, HGB,  HCT, MCV, PLT,  in the last 168 hours Cardiac Enzymes: No results found for this basename: CKTOTAL, CKMB, CKMBINDEX, TROPONINI,  in the last 168 hours  BNP (last 3 results) No results found for this basename: PROBNP,  in the last 8760 hours CBG: No results found for this basename: GLUCAP,  in the last 168 hours  Radiological Exams on Admission: No results found.  EKG: pending  Assessment/Plan Active Problems:   * No active hospital problems. *  Right knee pain, possible due to hematoma, wound infection, or septic arthritis. -  Orthopedics consult placed -  Vancomycin -  Wound care consult -  Wet to dry dressing -  Robaxin and ultram prn  Hyponatremia, may be due to dehydration vs. Severe infection induced SIADH -  Repeat -  IVF for now and repeat BMP in a few hours -  UA with urine sodium and osms -  Serum osms  Atrial fibrillation, currently in sinus rhythm -  Continue bid ASA  Hypothyroidism, stable.  Recent adjustment made to synthroid.  Continue synthroid and repeat TFTs as outpatient  GERD, stable.  Continue PPI  Bladder spasms, continue ditropan XL  Leukocytosis, similar to perioperative period but now 2 months out.  LIkely marker of infection -  Abx as above -  Trend  Iron deficiency anemia, hgb stable. continue iron  Diet:  NPO for possible surgery Access:  PIV IVF:  NS Proph:  SCD  Code Status: full Family Communication: spoke with patient alone Disposition Plan: Admit to med-surg  Time spent: 60 min Hicks Feick [facility] Pager [phone]  If 7PM-7AM, please contact night-coverage [email or web address] Password [facility] 11/26/2012, 11:09 PM

## 2012-11-27 ENCOUNTER — Encounter (HOSPITAL_COMMUNITY): Payer: Self-pay | Admitting: Orthopedic Surgery

## 2012-11-27 LAB — CBC
HCT: 26.8 % — ABNORMAL LOW (ref 36.0–46.0)
MCH: 27.4 pg (ref 26.0–34.0)
MCV: 84.3 fL (ref 78.0–100.0)
RBC: 3.18 MIL/uL — ABNORMAL LOW (ref 3.87–5.11)
RDW: 16.1 % — ABNORMAL HIGH (ref 11.5–15.5)
WBC: 12.1 10*3/uL — ABNORMAL HIGH (ref 4.0–10.5)

## 2012-11-27 LAB — URINALYSIS, ROUTINE W REFLEX MICROSCOPIC
Leukocytes, UA: NEGATIVE
Nitrite: NEGATIVE
Specific Gravity, Urine: 1.016 (ref 1.005–1.030)
Urobilinogen, UA: 0.2 mg/dL (ref 0.0–1.0)
pH: 6 (ref 5.0–8.0)

## 2012-11-27 LAB — APTT: aPTT: 48 seconds — ABNORMAL HIGH (ref 24–37)

## 2012-11-27 LAB — OSMOLALITY: Osmolality: 266 mOsm/kg — ABNORMAL LOW (ref 275–300)

## 2012-11-27 LAB — BASIC METABOLIC PANEL
CO2: 25 mEq/L (ref 19–32)
Calcium: 8.8 mg/dL (ref 8.4–10.5)
Chloride: 94 mEq/L — ABNORMAL LOW (ref 96–112)
Creatinine, Ser: 0.74 mg/dL (ref 0.50–1.10)
Glucose, Bld: 115 mg/dL — ABNORMAL HIGH (ref 70–99)

## 2012-11-27 LAB — SODIUM, URINE, RANDOM: Sodium, Ur: 10 mEq/L

## 2012-11-27 LAB — OSMOLALITY, URINE: Osmolality, Ur: 235 mOsm/kg — ABNORMAL LOW (ref 390–1090)

## 2012-11-27 LAB — C-REACTIVE PROTEIN: CRP: 22.3 mg/dL — ABNORMAL HIGH (ref <0.60)

## 2012-11-27 MED ORDER — ADULT MULTIVITAMIN W/MINERALS CH
1.0000 | ORAL_TABLET | Freq: Every day | ORAL | Status: DC
  Administered 2012-11-27 – 2012-12-03 (×6): 1 via ORAL
  Filled 2012-11-27 (×8): qty 1

## 2012-11-27 MED ORDER — VANCOMYCIN HCL IN DEXTROSE 750-5 MG/150ML-% IV SOLN
750.0000 mg | Freq: Two times a day (BID) | INTRAVENOUS | Status: DC
  Administered 2012-11-27 – 2012-11-30 (×7): 750 mg via INTRAVENOUS
  Filled 2012-11-27 (×7): qty 150

## 2012-11-27 MED ORDER — HYDROMORPHONE HCL PF 1 MG/ML IJ SOLN
0.5000 mg | Freq: Four times a day (QID) | INTRAMUSCULAR | Status: DC | PRN
  Administered 2012-11-27: 0.5 mg via INTRAVENOUS
  Filled 2012-11-27: qty 1

## 2012-11-27 MED ORDER — ENSURE COMPLETE PO LIQD
237.0000 mL | ORAL | Status: DC
  Administered 2012-11-27 – 2012-12-01 (×4): 237 mL via ORAL

## 2012-11-27 NOTE — Care Management Note (Addendum)
    Page 1 of 2   12/03/2012     11:45:41 AM   CARE MANAGEMENT NOTE 12/03/2012  Patient:  [name] [name], [name] [name]   Account Number:  [id number]  Date Initiated:  11/27/2012  Documentation initiated by:  Colleen Can  Subjective/Objective Assessment:   dx rt knee wound, hyponatremia     Action/Plan:   Cm spoke with patient. Plans are for her to return to her home in [place] where family members are her caregivers. She is already active with [facility]. She already has DME.   Anticipated DC Date:  11/29/2012   Anticipated DC Plan:  HOME W HOME HEALTH SERVICES      DC Planning Services  CM consult      [facility] Choice  Resumption Of Svcs/PTA Provider   Choice offered to / List presented to:  C-1 Patient        HH arranged  HH-1 RN  HH-2 PT  HH-4 NURSE'S AIDE      HH agency  [facility]   Status of service:  Completed, signed off Medicare Important Message given?   (If response is "NO", the following Medicare IM given date fields will be blank) Date Medicare IM given:   Date Additional Medicare IM given:    Discharge Disposition:  HOME W HOME HEALTH SERVICES  Per UR Regulation:  Reviewed for med. necessity/level of care/duration of stay  If discussed at Long Length of Stay Meetings, dates discussed:    Comments:  11/30/2012 Colleen Can BSN RN CCM [phone] Pt receiving blood transfusion. Hct-7.7, hgb-24.0; Pt active with [facility]; doctor will need to reorder [facility] services if needed. Pt will have support of several family members who are health care professionals.  11/27/2012 Colleen Can BSN RN CCM [phone] [facility] rep notified of pt's admission. CM will follow for home needs.

## 2012-11-27 NOTE — Progress Notes (Signed)
Clinical Social Work Department BRIEF PSYCHOSOCIAL ASSESSMENT 11/27/2012  Patient:  [name] [name], [name] [name]     Account Number:  [id number]     Admit date:  11/26/2012  Clinical Social Worker:  Candie Chroman  Date/Time:  11/27/2012 12:40 PM  Referred by:  CSW  Date Referred:  11/27/2012 Referred for  SNF Placement   Other Referral:   Interview type:  Patient Other interview type:    PSYCHOSOCIAL DATA Living Status:  ALONE Admitted from facility:   Level of care:   Primary support name:  Malva Cogan Primary support relationship to patient:  CHILD, ADULT Degree of support available:   supportive    CURRENT CONCERNS Current Concerns  Post-Acute Placement   Other Concerns:    SOCIAL WORK ASSESSMENT / PLAN Pt is an 79 yr old female living at home prior to hospitalization. CSW met with pt to assist with d/c planning. Pt recently d/c from [facility] where she was receiving [facility]. Pt states she plans to return home following hospital d/c. Pt states she has family that will help her at home and will not consider SNF placement. RNCM is aware of pt's wishes to return home and is assisting with [facility] services.   Assessment/plan status:  No Further Intervention Required Other assessment/ plan:   Home with [facility] services.   Information/referral to community resources:   None needed at this time.    PATIENT'S/FAMILY'S RESPONSE TO PLAN OF CARE: Pt is looking forward to feeling beeter and returning to her home.   Cori Razor LCSW [phone]

## 2012-11-27 NOTE — Progress Notes (Signed)
INITIAL NUTRITION ASSESSMENT  DOCUMENTATION CODES Per approved criteria  -Not Applicable   INTERVENTION: Provide Ensure Complete, Mighty Shake and snack once daily Provide Multivitamin with minerals daily  NUTRITION DIAGNOSIS: Inadequate oral intake related to poor appetite as evidenced by 7% wt loss in less than one month.   Goal: Pt to meet >/= 90% of their estimated nutrition needs   Monitor:  PO intake Weight Labs  Reason for Assessment: Malnutrition Screening Tool, score of 4  79 y.o. female  Admitting Dx: Pain in right knee  ASSESSMENT: 79 y.o. year-old female with history of CAD, atrial fibrillation not on anticoagulation due to GIB, hypothyroidism, arthritis, who underwent right total knee arthroplasty by Dr. Lequita Halt on 10/08/12. She was discharged on doxycycline to rehab and subsequently had wound dehiscence and has been treated with wet to dry dressing. She was discharged to home a few days ago, but developed increased weakness and had a fall the night prior to admission. She had lower back pain and pain in the right upper leg and knee post-fall. The home health nurse noticed her knee was appearing more erythematous and she was referred to the ER.  Pt reports that she has had a poor appetite since her surgery. She has been nauseous and the sight/smell of food make her nausea worse. She reports that PTA she was only eating a few bites at each meal and she was drinking vanilla Ensure once daily (4 oz BID). Pt states she ate better today than she has in months. She reports her usual body weight is 165 lbs.   Height: Ht Readings from Last 1 Encounters:  11/27/12 5' 5.5" (1.664 m)    Weight: Wt Readings from Last 1 Encounters:  11/27/12 155 lb (70.308 kg)    Ideal Body Weight: 128 lbs  % Ideal Body Weight: 121%  Wt Readings from Last 10 Encounters:  11/27/12 155 lb (70.308 kg)  11/13/12 162 lb 3.2 oz (73.573 kg)  11/12/12 162 lb 3.2 oz (73.573 kg)  11/05/12 167 lb  3.2 oz (75.841 kg)  11/02/12 167 lb 3.2 oz (75.841 kg)  10/08/12 169 lb (76.658 kg)  10/08/12 169 lb (76.658 kg)  10/08/12 169 lb (76.658 kg)  10/05/12 169 lb (76.658 kg)  10/02/12 169 lb (76.658 kg)    Usual Body Weight: 165 lbs  % Usual Body Weight: 94%  BMI:  Body mass index is 25.39 kg/(m^2).  Estimated Nutritional Needs: Kcal: 1550-1700 Protein: 75-85 grams Fluid: 2.1 L/day  Skin: +1 RLE edema, dehisced right knee  Diet Order: General  EDUCATION NEEDS: -No education needs identified at this time   Intake/Output Summary (Last 24 hours) at 11/27/12 1426 Last data filed at 11/27/12 1023  Gross per 24 hour  Intake  712.5 ml  Output    700 ml  Net   12.5 ml    Last BM: 9/1  Labs:   Recent Labs Lab 11/27/12 0452  NA 127*  K 3.8  CL 94*  CO2 25  BUN 13  CREATININE 0.74  CALCIUM 8.8  GLUCOSE 115*    CBG (last 3)  No results found for this basename: GLUCAP,  in the last 72 hours  Scheduled Meds: . aspirin EC  325 mg Oral Daily  . docusate sodium  100 mg Oral BID  . ferrous sulfate  325 mg Oral BID  . gabapentin  300 mg Oral BID  . levothyroxine  150 mcg Oral QAC breakfast  . oxybutynin  10 mg Oral Daily  .  pantoprazole  40 mg Oral Daily  . simvastatin  20 mg Oral QPC supper  . vancomycin  750 mg Intravenous Q12H    Continuous Infusions: . dextrose 5 % and 0.9% NaCl 75 mL/hr at 11/27/12 1610    Past Medical History  Diagnosis Date  . Mixed hyperlipidemia   . Hypothyroidism   . GERD (gastroesophageal reflux disease)   . Atrial fibrillation     Remote amiodarone use, recurred in 07/2011 post op from knee arthroplasty  . Obese   . Asymptomatic cholelithiasis   . History of blood transfusion 08/31/2011    A+ type  . History of GI bleed     Gastritis, was on Xarelto and ASA  . Carpal tunnel syndrome   . Arthritis     Knees  . DJD (degenerative joint disease), thoracolumbar   . Rheumatoid arthritis   . H/O bronchitis   . Hemorrhoids   .  Urinary incontinence   . Measles     as child  . Mumps     as child  . Carotid artery disease   . PONV (postoperative nausea and vomiting)     Past Surgical History  Procedure Laterality Date  . Tonsillectomy    . Cholecystectomy  04/29/2005    Dr. Luretha Murphy  . Thoracotomy  4/07    Removal bronchogenic cyst  . Total knee arthroplasty  08/10/2011    Procedure: TOTAL KNEE ARTHROPLASTY;  Surgeon: Loanne Drilling, MD;  Location: [facility];  Service: Orthopedics;  Laterality: Left;  . Rotator cuff repair  1992, 1993    Bilateral  . Tubal ligation      Bilateral  . Ventral hernia repair  05/25/2005    Incarcerated, lysis of adhesions, Dr. Luretha Murphy  . Appendectomy      as child  . Bronchogenic cyst  2007  . Total knee arthroplasty Right 10/08/2012    Procedure: RIGHT TOTAL KNEE ARTHROPLASTY;  Surgeon: Loanne Drilling, MD;  Location: [facility];  Service: Orthopedics;  Laterality: Right;  . Irrigation and debridement knee Right 10/11/2012    Procedure: IRRIGATION AND DEBRIDEMENT KNEE;  Surgeon: Loanne Drilling, MD;  Location: [facility];  Service: Orthopedics;  Laterality: Right;    Ian Malkin RD, LDN Inpatient Clinical Dietitian Pager: [phone] After Hours Pager: [phone]

## 2012-11-27 NOTE — Progress Notes (Signed)
[facility] PROGRESS NOTE  [name] [name] ZOX:[id number] DOB: 07-25-1930 DOA: 11/26/2012  PCP: Karlene Einstein, MD  Brief HPI: The patient is a 79 y.o. year-old female with history of CAD, atrial fibrillation not on anticoagulation due to bleeding into knee and h/o GIB, hypothyroidism, arthritis, who underwent right total knee arthroplasty by Dr. Lequita Halt on 10/08/12. Her post-operative course was complicated by post-operative hematoma requiring I&D on 7/17. She was discharged on doxycycline to rehab and subsequently had wound dehiscence and has been treated with wet to dry dressing. She was discharged to home a few days ago, but developed increased weakness and had a fall the night prior to admission. She had lower back pain and pain in the right upper leg and knee post-fall. The home health nurse noticed her knee was appearing more erythematous and she was referred to the ER. She was seen at [facility] ER where it was noted her right knee was red, swollen and painful. WBC 20.7, hgb 9.3, plt 386, Na 120, K 3.6, chloride 88, bicarb 23, BUN 11, cr 0.76. XR lumbar spine: Severe degenerative changes and scoliosis, no acute findings. XR right femur: No acute bony abnormality, moderate joint effusion of the right knee.   Past medical history:  Past Medical History  Diagnosis Date  . Mixed hyperlipidemia   . Hypothyroidism   . GERD (gastroesophageal reflux disease)   . Atrial fibrillation     Remote amiodarone use, recurred in 07/2011 post op from knee arthroplasty  . Obese   . Asymptomatic cholelithiasis   . History of blood transfusion 08/31/2011    A+ type  . History of GI bleed     Gastritis, was on Xarelto and ASA  . Carpal tunnel syndrome   . Arthritis     Knees  . DJD (degenerative joint disease), thoracolumbar   . Rheumatoid arthritis   . H/O bronchitis   . Hemorrhoids   . Urinary incontinence   . Measles     as child  . Mumps     as child  . Carotid artery disease   . PONV  (postoperative nausea and vomiting)     Consultants: Ortho (Dr. Despina Hick)  Procedures: None  Antibiotics: IV Vanc 9/1-->  Subjective: Patient complains of 10/10 pain in right knee. Has some nausea but denies any vomiting.   Objective: Vital Signs  Filed Vitals:   11/26/12 2145 11/26/12 2300 11/27/12 0100 11/27/12 0518  BP: 146/68   131/71  Pulse: 75   71  Temp: 99 F (37.2 C) 97.6 F (36.4 C)  98.5 F (36.9 C)  TempSrc: Oral Oral  Oral  Resp: 16   16  Height:   5' 5.5" (1.664 m)   Weight:   70.308 kg (155 lb)   SpO2: 96%   97%    Intake/Output Summary (Last 24 hours) at 11/27/12 1131 Last data filed at 11/27/12 1023  Gross per 24 hour  Intake  712.5 ml  Output    700 ml  Net   12.5 ml   Filed Weights   11/27/12 0100  Weight: 70.308 kg (155 lb)    General appearance: alert, cooperative, appears stated age and no distress Head: Normocephalic, without obvious abnormality, atraumatic Resp: clear to auscultation bilaterally Cardio: regular rate and rhythm, S1, S2 normal, no murmur, click, rub or gallop GI: soft, non-tender; bowel sounds normal; no masses,  no organomegaly Extremities: right knee is erythematous, warm to touch, restricted ROM Neurologic: Alert. No focal deficits.  Lab Results:  Basic Metabolic Panel:  Recent Labs Lab 11/27/12 0452  NA 127*  K 3.8  CL 94*  CO2 25  GLUCOSE 115*  BUN 13  CREATININE 0.74  CALCIUM 8.8   CBC:  Recent Labs Lab 11/27/12 0452  WBC 12.1*  HGB 8.7*  HCT 26.8*  MCV 84.3  PLT 411*    Studies/Results: No results found.  Medications:  Scheduled: . aspirin EC  325 mg Oral Daily  . docusate sodium  100 mg Oral BID  . ferrous sulfate  325 mg Oral BID  . gabapentin  300 mg Oral BID  . levothyroxine  150 mcg Oral QAC breakfast  . oxybutynin  10 mg Oral Daily  . pantoprazole  40 mg Oral Daily  . simvastatin  20 mg Oral QPC supper  . vancomycin  750 mg Intravenous Q12H   Continuous: . dextrose 5 % and  0.9% NaCl 75 mL/hr at 11/27/12 0865   HQI:ONGEXBMWUXLKG, acetaminophen, ALPRAZolam, bisacodyl, HYDROmorphone (DILAUDID) injection, methocarbamol, ondansetron (ZOFRAN) IV, ondansetron, ondansetron, polyethylene glycol, promethazine, traMADol  Assessment/Plan:  Principal Problem:   Pain in right knee Active Problems:   Essential hypertension, benign   Hyponatremia   PVC's (premature ventricular contractions)   Surgical wound infection   Hypothyroidism    Right knee pain Possible due to wound infection, or septic arthritis. Orthopedics following. No plan for surgery today. Continue Vanc. PRn naroctics.  Hyponatremia Due to dehydration vs. Severe infection induced SIADH. Improving with IVF. Await osmolalities.   History of Atrial fibrillation Currently in sinus rhythm. Continue bid ASA. Not on anticoagulation due to history of bleeding.  Hypothyroidism Stable. Recent adjustment made to synthroid. Continue synthroid and repeat TFTs as outpatient   GERD Stable. Continue PPI   Bladder spasms Continue ditropan XL   Leukocytosis Similar to perioperative period but now 2 months out. LIkely marker of infection. Abx as above. Trend.  Iron deficiency anemia Hgb stable. continue iron.  Code Status:  Full code DVT Prophylaxis:   Aspirin Family Communication: Discussed with patient  Disposition Plan: Not ready for discharge. Still has medical issues.     LOS: 1 day   [facility]  [facility] Pager [phone] 11/27/2012, 11:31 AM  If 8PM-8AM, please contact night-coverage at [email or web address], password [facility]

## 2012-11-27 NOTE — Progress Notes (Addendum)
Subjective: Pt is well known to me from right TKA and subsequent hematoma evacuation due to bleeding from anticoagulant. She has had difficult course medically and was seen in office last week with open area present at distal aspect of incision. Started wet to dry dressing changes with home health. Has not had fever chills or systemic symptoms. She had a fall over her walker and went to the hospital for evaluation. Did not have any fractures but there was concern over possible wound infection. She was transferred here for evaluation. She does not have any increased pain in the knee. She has not had any increased redness according to her   Objective: Vital signs in last 24 hours: Temp:  [97.6 F (36.4 C)-99 F (37.2 C)] 98.5 F (36.9 C) (09/02 0518) Pulse Rate:  [71-75] 71 (09/02 0518) Resp:  [16] 16 (09/02 0518) BP: (131-146)/(68-71) 131/71 mmHg (09/02 0518) SpO2:  [96 %-97 %] 97 % (09/02 0518) Weight:  [155 lb (70.308 kg)] 155 lb (70.308 kg) (09/02 0100)  Intake/Output from previous day: 09/01 0701 - 09/02 0700 In: 712.5 [I.V.:562.5; IV Piggyback:150] Out: 500 [Urine:500] Intake/Output this shift:     Recent Labs  11/27/12 0452  HGB 8.7*    Recent Labs  11/27/12 0452  WBC 12.1*  RBC 3.18*  HCT 26.8*  PLT 411*    Recent Labs  11/27/12 0452  NA 127*  K 3.8  CL 94*  CO2 25  BUN 13  CREATININE 0.74  GLUCOSE 115*  CALCIUM 8.8    Recent Labs  11/27/12 0452  INR 1.30    Neurologically intact Incision: no drainage Compartment soft Open area with clean granulation tissue at base. Surrounding tissues actually look better than last week. Has raw edges around incision from previous blisters which look pink but are not truly erythematous. She has a small effusion but no significant warmth about the knee  I placed a new wet to dry dressing  Assessment/Plan: Right knee wound dehiscence- Continue wet to dry dressing changes. No need for surgical intervention today.  She may eat. WBC 12. Will follow. Can probably discharge tomorrow if her wound does not decompensate. Can transfer to my service if medical team does not feel that there are any active medical issues they need to have her on their service for.[name]   Loanne Drilling 11/27/2012, 7:24 AM

## 2012-11-27 NOTE — Progress Notes (Signed)
[facility]  Patient Status: Active (receiving services up to time of hospitalization)  [facility] is providing the following services: RN and PT  If patient discharges after hours, please call [phone].   [name] [name] 11/27/2012, 12:55 PM

## 2012-11-28 DIAGNOSIS — R9389 Abnormal findings on diagnostic imaging of other specified body structures: Secondary | ICD-10-CM

## 2012-11-28 DIAGNOSIS — I6529 Occlusion and stenosis of unspecified carotid artery: Secondary | ICD-10-CM

## 2012-11-28 LAB — BASIC METABOLIC PANEL
BUN: 8 mg/dL (ref 6–23)
CO2: 25 mEq/L (ref 19–32)
Calcium: 8.2 mg/dL — ABNORMAL LOW (ref 8.4–10.5)
Glucose, Bld: 116 mg/dL — ABNORMAL HIGH (ref 70–99)
Potassium: 4 mEq/L (ref 3.5–5.1)
Sodium: 127 mEq/L — ABNORMAL LOW (ref 135–145)

## 2012-11-28 LAB — URINE CULTURE

## 2012-11-28 LAB — CBC
HCT: 24.4 % — ABNORMAL LOW (ref 36.0–46.0)
Hemoglobin: 7.9 g/dL — ABNORMAL LOW (ref 12.0–15.0)
MCH: 27.7 pg (ref 26.0–34.0)
MCHC: 32.4 g/dL (ref 30.0–36.0)
RBC: 2.85 MIL/uL — ABNORMAL LOW (ref 3.87–5.11)

## 2012-11-28 MED ORDER — PROMETHAZINE HCL 25 MG PO TABS
12.5000 mg | ORAL_TABLET | Freq: Four times a day (QID) | ORAL | Status: DC | PRN
  Administered 2012-12-01: 25 mg via ORAL
  Filled 2012-11-28: qty 1

## 2012-11-28 MED ORDER — SODIUM CHLORIDE 0.9 % IV SOLN
INTRAVENOUS | Status: DC
  Administered 2012-11-28 – 2012-11-29 (×3): via INTRAVENOUS

## 2012-11-28 MED ORDER — LIDOCAINE HCL (PF) 1 % IJ SOLN
5.0000 mL | Freq: Once | INTRAMUSCULAR | Status: DC
  Filled 2012-11-28: qty 5

## 2012-11-28 NOTE — Progress Notes (Signed)
11/28/12 1500  PT Visit Information  Last PT Received On 11/28/12  Assistance Needed +1  History of Present Illness R TKA in July; post op hematoma with evacutation; wound dehiscence; pt returned home from SNF a few days ago and had a fall, returned to hospital to R/O infection  PT Time Calculation  PT Start Time 1527  PT Stop Time 1547  PT Time Calculation (min) 20 min  Precautions  Precautions Knee;Fall  Cognition  Arousal/Alertness Awake/alert  Behavior During Therapy WFL for tasks assessed/performed  Overall Cognitive Status Within Functional Limits for tasks assessed  Bed Mobility  Bed Mobility Supine to Sit;Sit to Supine  Supine to Sit 4: Min assist;HOB flat  Sit to Supine 4: Min assist;HOB flat  Details for Bed Mobility Assistance with UB; cues to self assist  Transfers  Transfers Sit to Stand;Stand to Sit  Sit to Stand 4: Min guard;From bed  Stand to Sit 4: Min guard;To bed  Details for Transfer Assistance min/guard for wt shift and control of descent  Ambulation/Gait  Ambulation/Gait Assistance 4: Min guard;5: Supervision  Ambulation Distance (Feet) 190 Feet  Assistive device Rolling walker  Ambulation/Gait Assistance Details min/guard to close supervision for safety adn balance, cues for Rw distance form self; head turns/nods, direction changes without LOB  Gait Pattern Step-to pattern;Decreased stride length  Gait velocity decr  General Gait Details better gait stability this pm, controlled environment   Static Sitting Balance  Static Sitting - Balance Support No upper extremity supported;Feet supported  Static Sitting - Level of Assistance 5: Stand by assistance  PT - End of Session  Equipment Utilized During Treatment Gait belt  Activity Tolerance Patient tolerated treatment well  Patient left in bed;with call bell/phone within reach  Nurse Communication Mobility status  PT - Assessment/Plan  PT Plan Current plan remains appropriate  PT Frequency Min 3X/week   Follow Up Recommendations Home health PT;Supervision - Intermittent  PT equipment None recommended by PT  PT Goal Progression  Progress towards PT goals Progressing toward goals  Acute Rehab PT Goals  Time For Goal Achievement 12/12/12  Potential to Achieve Goals Good  PT General Charges  $$ ACUTE PT VISIT 1 Procedure  PT Treatments  $Gait Training 8-22 mins

## 2012-11-28 NOTE — Progress Notes (Addendum)
Patient ID: [name] [name], female   DOB: 10-15-1930, 79 y.o.   MRN: [id number] [facility] PROGRESS NOTE  EMILYANNE MCGOUGH WUJ:[id number] DOB: Mar 16, 1931 DOA: 11/26/2012 PCP: Karlene Einstein, MD  Brief narrative: 79 y.o. year-old female with history of CAD, atrial fibrillation not on anticoagulation due to bleeding into knee and h/o GIB, hypothyroidism, arthritis, who underwent right total knee arthroplasty by Dr. Lequita Halt on 10/08/12. Her post-operative course was complicated by post-operative hematoma requiring I&D on 7/17. She was discharged on doxycycline to rehab and subsequently had wound dehiscence and has been treated with wet to dry dressing. She was discharged to home a few days prior to this admission, but developed increased weakness and had a fall the night prior to admission. She had lower back pain and pain in the right upper leg and knee post-fall. The home health nurse noticed her knee was appearing more erythematous and she was referred to the ER. She was seen at [facility] ER where it was noted her right knee was red, swollen and painful. WBC 20.7, hgb 9.3, plt 386, Na 120, K 3.6, chloride 88, bicarb 23, BUN 11, cr 0.76. XR lumbar spine: Severe degenerative changes and scoliosis, no acute findings. XR right femur: No acute bony abnormality, moderate joint effusion of the right knee.   Principal Problem:   Pain in right knee - appreciate ortho team assistance - Right knee wound dehiscence, right knee aspiration done today 9/3 with minimal fluid obtained after multiple passes - plan on ambulation today - continue analgesia as needed Active Problems:   Leukocytosis - secondary to an infection - continue Vancomycin day #3 - WBC is within normal limits this AM, repeat CBC in AM   Essential hypertension, benign - reasonable inpatient control    Atrial fibrillation - rate controlled and in sinus rhythm, no anticoagulation candidate due to history of GI bleed  - continue aspirin    Hyponatremia - unchanged over 24 hours, pt is overall dry on exam, will change IVF to NS at low rate - repeat BMP in AM   PVC's (premature ventricular contractions) - clinically asymptomatic, continue to monitor on telemetry    Surgical wound infection - appreciate ortho assistance - continue Vancomycin day #3, plan on transitioning to oral ABX    Hypothyroidism - continue synthroid    Anemia of chronic disease and iron deficiency anemia  - slight drop in Hg but no signs of overt bleeding - will monitor closely, continue iron supplementation  - repeat CBC in AM   GERD - continue PPI  Consultants:  Ortho Procedures/Studies:  9/3 --> right knee aspiration  Antibiotics:  Vancomycin 9/1 -->   Code Status: Full Family Communication: Pt at bedside Disposition Plan: Home when medically stable  HPI/Subjective: No events overnight.   Objective: Filed Vitals:   11/27/12 0518 11/27/12 1500 11/27/12 2111 11/28/12 0500  BP: 131/71 130/66 131/66 144/83  Pulse: 71 77 80 85  Temp: 98.5 F (36.9 C) 98.3 F (36.8 C) 98.8 F (37.1 C) 99 F (37.2 C)  TempSrc: Oral Oral Oral Oral  Resp: 16 16 18 18   Height:      Weight:      SpO2: 97% 98% 97% 95%    Intake/Output Summary (Last 24 hours) at 11/28/12 1134 Last data filed at 11/28/12 0900  Gross per 24 hour  Intake 1932.5 ml  Output   1200 ml  Net  732.5 ml    Exam:   General:  Pt is alert, follows commands  appropriately, not in acute distress  Cardiovascular: Regular rate and rhythm, S1/S2, SEM 5/6, no rubs, no gallops  Respiratory: Clear to auscultation bilaterally, no wheezing, no crackles, no rhonchi  Abdomen: Soft, non tender, non distended, bowel sounds present, no guarding  Extremities: Right knee still erythematous and warm to touch, pulses DP and PT palpable bilaterally  Neuro: Grossly nonfocal  Data Reviewed: Basic Metabolic Panel:  Recent Labs Lab 11/27/12 0452 11/28/12 0417  NA 127* 127*  K 3.8  4.0  CL 94* 95*  CO2 25 25  GLUCOSE 115* 116*  BUN 13 8  CREATININE 0.74 0.64  CALCIUM 8.8 8.2*   CBC:  Recent Labs Lab 11/27/12 0452 11/28/12 0417  WBC 12.1* 10.5  HGB 8.7* 7.9*  HCT 26.8* 24.4*  MCV 84.3 85.6  PLT 411* 386     Recent Results (from the past 240 hour(s))  URINE CULTURE     Status: None   Collection Time    11/27/12  5:18 AM      Result Value Range Status   Specimen Description URINE, CLEAN CATCH   Final   Special Requests NONE   Final   Culture  Setup Time     Final   Value: 11/27/2012 09:44     Performed at [facility] Count     Final   Value: 20,OOO COLONIES/ML     Performed at [facility]   Culture     Final   Value: Multiple bacterial morphotypes present, none predominant. Suggest appropriate recollection if clinically indicated.     Performed at [facility]   Report Status 11/28/2012 FINAL   Final  MRSA PCR SCREENING     Status: None   Collection Time    11/27/12  9:38 AM      Result Value Range Status   MRSA by PCR NEGATIVE  NEGATIVE Final   Comment:            The GeneXpert MRSA Assay (FDA     approved for NASAL specimens     only), is one component of a     comprehensive MRSA colonization     surveillance program. It is not     intended to diagnose MRSA     infection nor to guide or     monitor treatment for     MRSA infections.     Scheduled Meds: . aspirin EC  325 mg Oral Daily  . docusate sodium  100 mg Oral BID  . feeding supplement  237 mL Oral Q24H  . ferrous sulfate  325 mg Oral BID  . gabapentin  300 mg Oral BID  . levothyroxine  150 mcg Oral QAC breakfast  . lidocaine (PF)  5 mL Intradermal Once  . multivitamin with minerals  1 tablet Oral Daily  . oxybutynin  10 mg Oral Daily  . pantoprazole  40 mg Oral Daily  . simvastatin  20 mg Oral QPC supper  . vancomycin  750 mg Intravenous Q12H   Continuous Infusions: . dextrose 5 % and 0.9% NaCl 75 mL/hr at 11/28/12 0004    [name], MD  [facility] Pager [phone]  If 7PM-7AM, please contact night-coverage [email or web address] Password [email or web address] 11/28/2012, 11:34 AM   LOS: 2 days

## 2012-11-28 NOTE — Evaluation (Signed)
Physical Therapy Evaluation Patient Details Name: [name] [name] MRN: [id number] DOB: May 15, 1930 Today's Date: 11/28/2012 Time: 4098-1191 PT Time Calculation (min): 31 min  PT Assessment / Plan / Recommendation History of Present Illness  R TKA in July; post op hematoma with evacutation; wound dehiscence; pt returned home from SNF a few days ago and had a fall, returned to hospital to R/O infection  Clinical Impression  Plan is to go home, pt will need HHPT; Incr assist form family would be helpful for pt for incr safety;     PT Assessment  Patient needs continued PT services    Follow Up Recommendations       Does the patient have the potential to tolerate intense rehabilitation      Barriers to Discharge        Equipment Recommendations    no DME needs   Recommendations for Other Services     Frequency   6x   Precautions / Restrictions Precautions Precautions: Knee;Fall Restrictions Weight Bearing Restrictions: No   Pertinent Vitals/Pain Right knee painful      Mobility  Bed Mobility Bed Mobility: Supine to Sit Supine to Sit: 4: Min assist;With rails;HOB elevated Details for Bed Mobility Assistance: with UB; cues to self assist Transfers Transfers: Sit to Stand;Stand to Sit Sit to Stand: 4: Min guard;From bed;From chair/3-in-1 Stand to Sit: 4: Min guard;To chair/3-in-1 Details for Transfer Assistance: min/guard for wt shift and control of descent Ambulation/Gait Ambulation/Gait Assistance: 4: Min guard Ambulation Distance (Feet): 60 Feet (and 10' more) Assistive device: Rolling walker Gait Pattern: Step-to pattern;Decreased stride length Gait velocity: decr Stairs: No    Exercises     PT Diagnosis: Difficulty walking  PT Problem List: Decreased strength;Decreased range of motion;Decreased mobility;Decreased knowledge of use of DME PT Treatment Interventions:       PT Goals(Current goals can be found in the care plan section) Acute Rehab PT  Goals Patient Stated Goal: "to go home, I have to my Medicare days are out" PT Goal Formulation: With patient Time For Goal Achievement: 12/12/12 Potential to Achieve Goals: Good  Visit Information  Last PT Received On: 11/28/12 Assistance Needed: +1 History of Present Illness: R TKA in July; post op hematoma with evacutation; wound dehiscence; pt returned home from SNF a few days ago and had a fall, returned to hospital to R/O infection       Prior Functioning  Home Living Family/patient expects to be discharged to:: Private residence Living Arrangements: Alone Available Help at Discharge: Family Type of Home: House Home Layout: Able to live on main level with bedroom/bathroom Home Equipment: Dan Humphreys - 2 wheels;Bedside commode;Cane - single point;Grab bars - tub/shower Additional Comments: pt states her sisters can help her as needed---no family present at this time Prior Function Level of Independence: Independent with assistive device(s) Comments: amb with RW since she left SNF a few days ago Communication Communication: No difficulties    Cognition  Cognition Arousal/Alertness: Awake/alert Behavior During Therapy: WFL for tasks assessed/performed Overall Cognitive Status: Within Functional Limits for tasks assessed    Extremity/Trunk Assessment Upper Extremity Assessment Upper Extremity Assessment: Overall WFL for tasks assessed Lower Extremity Assessment Lower Extremity Assessment: RLE deficits/detail RLE Deficits / Details: limited AROM/PROM right knee; grossly 15 to 35 degrees flexion, not tested further due to knee draining serosanguineous exudate RLE: Unable to fully assess due to pain   Balance Static Standing Balance Static Standing - Balance Support: No upper extremity supported;During functional activity Static Standing - Level  of Assistance: 5: Stand by assistance  End of Session PT - End of Session Equipment Utilized During Treatment: Gait belt Activity  Tolerance: Patient tolerated treatment well Patient left: in chair;with call bell/phone within reach Nurse Communication: Mobility status;Other (comment) (knee draining through bandage)  GP     [facility] 11/28/2012, 11:22 AM

## 2012-11-28 NOTE — Progress Notes (Addendum)
ANTIBIOTIC CONSULT NOTE - INITIAL  Pharmacy Consult for Vancomycin Indication: Right infected knee  Allergies  Allergen Reactions  . Omeprazole Nausea And Vomiting  . Penicillins Itching  . Morphine Rash    And hot flashes  . Xarelto [Rivaroxaban] Other (See Comments)    Bleeding,spitting up blood    Patient Measurements: Height: 5' 5.5" (166.4 cm) Weight: 155 lb (70.308 kg) IBW/kg (Calculated) : 58.15  Vital Signs: Temp: 99 F (37.2 C) (09/03 0500) Temp src: Oral (09/03 0500) BP: 144/83 mmHg (09/03 0500) Pulse Rate: 85 (09/03 0500) Intake/Output from previous day: 09/02 0701 - 09/03 0700 In: 1692.5 [I.V.:1692.5] Out: 1000 [Urine:1000] Intake/Output from this shift: Total I/O In: 240 [P.O.:240] Out: 400 [Urine:400]  Labs:  Recent Labs  11/27/12 0452 11/28/12 0417  WBC 12.1* 10.5  HGB 8.7* 7.9*  PLT 411* 386  CREATININE 0.74 0.64   Estimated Creatinine Clearance: 55.8 ml/min (by C-G formula based on Cr of 0.64). No results found for this basename: VANCOTROUGH, Leodis Binet, VANCORANDOM, GENTTROUGH, GENTPEAK, GENTRANDOM, TOBRATROUGH, TOBRAPEAK, TOBRARND, AMIKACINPEAK, AMIKACINTROU, AMIKACIN,  in the last 72 hours   Microbiology: Recent Results (from the past 720 hour(s))  URINE CULTURE     Status: None   Collection Time    11/27/12  5:18 AM      Result Value Range Status   Specimen Description URINE, CLEAN CATCH   Final   Special Requests NONE   Final   Culture  Setup Time     Final   Value: 11/27/2012 09:44     Performed at [facility] Count     Final   Value: 20,OOO COLONIES/ML     Performed at [facility]   Culture     Final   Value: Multiple bacterial morphotypes present, none predominant. Suggest appropriate recollection if clinically indicated.     Performed at [facility]   Report Status 11/28/2012 FINAL   Final  MRSA PCR SCREENING     Status: None   Collection Time    11/27/12  9:38 AM      Result Value Range  Status   MRSA by PCR NEGATIVE  NEGATIVE Final   Comment:            The GeneXpert MRSA Assay (FDA     approved for NASAL specimens     only), is one component of a     comprehensive MRSA colonization     surveillance program. It is not     intended to diagnose MRSA     infection nor to guide or     monitor treatment for     MRSA infections.    Medical History: Past Medical History  Diagnosis Date  . Mixed hyperlipidemia   . Hypothyroidism   . GERD (gastroesophageal reflux disease)   . Atrial fibrillation     Remote amiodarone use, recurred in 07/2011 post op from knee arthroplasty  . Obese   . Asymptomatic cholelithiasis   . History of blood transfusion 08/31/2011    A+ type  . History of GI bleed     Gastritis, was on Xarelto and ASA  . Carpal tunnel syndrome   . Arthritis     Knees  . DJD (degenerative joint disease), thoracolumbar   . Rheumatoid arthritis   . H/O bronchitis   . Hemorrhoids   . Urinary incontinence   . Measles     as child  . Mumps     as child  .  Carotid artery disease   . PONV (postoperative nausea and vomiting)     Medications:  Scheduled:  . aspirin EC  325 mg Oral Daily  . docusate sodium  100 mg Oral BID  . feeding supplement  237 mL Oral Q24H  . ferrous sulfate  325 mg Oral BID  . gabapentin  300 mg Oral BID  . levothyroxine  150 mcg Oral QAC breakfast  . lidocaine (PF)  5 mL Intradermal Once  . multivitamin with minerals  1 tablet Oral Daily  . oxybutynin  10 mg Oral Daily  . pantoprazole  40 mg Oral Daily  . simvastatin  20 mg Oral QPC supper  . vancomycin  750 mg Intravenous Q12H   Infusions:  . dextrose 5 % and 0.9% NaCl 75 mL/hr at 11/28/12 0004   Assessment: 79 yo s/p TKA 7/14 with complication of hematoma requiring I&D 7/17 d/c'd home on Doxycycline now c/o right knee red, swollen and painful. Vancomycin IV started on 9/1 for infected right knee. MD attempted knee aspiration today without any fluid return.  Day #3  vancomycin 750mg  Q12H  Labs and vital signs stable   Goal of Therapy:  Vancomycin trough level 15-20 mcg/ml  Plan:  1. Continue vancomycin 750mg  Q12H for now 2. Obtain trough prior to 9/4 AM dose 3. Wait for further plans from orthopedic MD  Radigan,Megan 11/28/2012,10:01 AM     9/3: Agree with plan as above. Obtain trough tomorrow AM.  Hessie Knows, PharmD, BCPS Pager [phone] 11/28/2012 10:21 AM

## 2012-11-28 NOTE — Progress Notes (Addendum)
Subjective: Pt still feels weak and has leg pain. Pain began with the fall   Objective: Vital signs in last 24 hours: Temp:  [98.3 F (36.8 C)-99 F (37.2 C)] 99 F (37.2 C) (09/03 0500) Pulse Rate:  [77-85] 85 (09/03 0500) Resp:  [16-18] 18 (09/03 0500) BP: (130-144)/(66-83) 144/83 mmHg (09/03 0500) SpO2:  [95 %-98 %] 95 % (09/03 0500)  Intake/Output from previous day: 09/02 0701 - 09/03 0700 In: 1692.5 [I.V.:1692.5] Out: 1000 [Urine:1000] Intake/Output this shift:     Recent Labs  11/27/12 0452 11/28/12 0417  HGB 8.7* 7.9*    Recent Labs  11/27/12 0452 11/28/12 0417  WBC 12.1* 10.5  RBC 3.18* 2.85*  HCT 26.8* 24.4*  PLT 411* 386    Recent Labs  11/27/12 0452 11/28/12 0417  NA 127* 127*  K 3.8 4.0  CL 94* 95*  CO2 25 25  BUN 13 8  CREATININE 0.74 0.64  GLUCOSE 115* 116*  CALCIUM 8.8 8.2*    Recent Labs  11/27/12 0452  INR 1.30    Neurologically intact Neurovascular intact right knee small effusion. Wound unchanged. wet to dry dressing done  Assessment/Plan: Right knee wound dehiscence- Will aspirate knee today. WBC normal. If aspiration looks fine then she will not need surgery. I have ordered PT to ambulate today.   [name] [name] 11/28/2012, 7:01 AM     Addendum- After a sterile prep I infiltrated the subcutaneous tissues with 4 ml of 1% Lidocaine and attempted aspiration. I was able to obtain only about 2 ml of blood. No purulence.Attempted multiple passes without any fluid

## 2012-11-29 LAB — CBC
HCT: 26.7 % — ABNORMAL LOW (ref 36.0–46.0)
MCH: 27.2 pg (ref 26.0–34.0)
MCV: 85.3 fL (ref 78.0–100.0)
RBC: 3.13 MIL/uL — ABNORMAL LOW (ref 3.87–5.11)
WBC: 10.6 10*3/uL — ABNORMAL HIGH (ref 4.0–10.5)

## 2012-11-29 LAB — BASIC METABOLIC PANEL
BUN: 4 mg/dL — ABNORMAL LOW (ref 6–23)
CO2: 26 mEq/L (ref 19–32)
Calcium: 8.5 mg/dL (ref 8.4–10.5)
Chloride: 99 mEq/L (ref 96–112)
Creatinine, Ser: 0.51 mg/dL (ref 0.50–1.10)

## 2012-11-29 MED ORDER — FLUCONAZOLE 200 MG PO TABS: 200.0000 mg | ORAL_TABLET | Freq: Every day | ORAL | Status: DC

## 2012-11-29 MED ORDER — FLUCONAZOLE 200 MG PO TABS
200.0000 mg | ORAL_TABLET | Freq: Every day | ORAL | Status: AC
  Administered 2012-11-29 – 2012-11-30 (×2): 200 mg via ORAL
  Filled 2012-11-29 (×2): qty 1

## 2012-11-29 NOTE — Progress Notes (Signed)
Patient ID: [name] [name], female   DOB: [birth year], 79 y.o.   MRN: [id number] [facility] PROGRESS NOTE  [name] [name] NWG:[id number] DOB: 22-May-1930 DOA: 11/26/2012 PCP: Karlene Einstein, MD  Brief narrative:  79 y.o. year-old female with history of CAD, atrial fibrillation not on anticoagulation due to bleeding into knee and h/o GIB, hypothyroidism, arthritis, who underwent right total knee arthroplasty by Dr. Lequita Halt on 10/08/12. Her post-operative course was complicated by post-operative hematoma requiring I&D on 7/17. She was discharged on doxycycline to rehab and subsequently had wound dehiscence and has been treated with wet to dry dressing. She was discharged to home a few days prior to this admission, but developed increased weakness and had a fall the night prior to admission. She had lower back pain and pain in the right upper leg and knee post-fall. The home health nurse noticed her knee was appearing more erythematous and she was referred to the ER. She was seen at [facility] ER where it was noted her right knee was red, swollen and painful. WBC 20.7, hgb 9.3, plt 386, Na 120, K 3.6, chloride 88, bicarb 23, BUN 11, cr 0.76. XR lumbar spine: Severe degenerative changes and scoliosis, no acute findings. XR right femur: No acute bony abnormality, moderate joint effusion of the right knee.   Principal Problem:  Pain in right knee  - appreciate ortho team assistance  - Right knee wound dehiscence, right knee aspiration done 9/3 with minimal fluid obtained after multiple passes  - wound repacked today, continue ambulation as pt able to tolerate  - continue analgesia as needed  Active Problems:  Leukocytosis  - secondary to an infection  - continue Vancomycin day #4 - plan on transitioning to oral ABX in AM Essential hypertension, benign  - reasonable inpatient control  Atrial fibrillation  - rate controlled and in sinus rhythm, no anticoagulation candidate due to history of GI bleed  -  continue aspirin  Hyponatremia  - secondary to pre renal etiology - NS IVF provided and pt responded well, Na trending up 127 --> 131 this AM - repeat BMP in AM  PVC's (premature ventricular contractions)  - clinically asymptomatic, continue to monitor on telemetry  Surgical wound infection  - appreciate ortho assistance  - continue Vancomycin day #4 plan on transitioning to oral ABX in AM Hypothyroidism  - continue synthroid  Anemia of chronic disease and iron deficiency anemia  - slight drop in Hg but no signs of overt bleeding  - will monitor closely, continue iron supplementation  - repeat CBC in AM  GERD  - continue PPI   Consultants:  Ortho Procedures/Studies:  9/3 --> right knee aspiration  Antibiotics:  Vancomycin 9/1 -->   Code Status: Full  Family Communication: Pt at bedside  Disposition Plan: Home when medically stable   HPI/Subjective: No events overnight.   Objective: Filed Vitals:   11/28/12 0500 11/28/12 1436 11/28/12 2215 11/29/12 0643  BP: 144/83 140/80 162/71 160/69  Pulse: 85 85 69 75  Temp: 99 F (37.2 C) 98.2 F (36.8 C) 98.7 F (37.1 C) 98.8 F (37.1 C)  TempSrc: Oral Oral Oral Oral  Resp: 18 16 18 14   Height:      Weight:      SpO2: 95% 96% 98% 95%    Intake/Output Summary (Last 24 hours) at 11/29/12 1326 Last data filed at 11/29/12 0900  Gross per 24 hour  Intake    690 ml  Output   2700 ml  Net  -  2010 ml    Exam:   General:  Pt is alert, follows commands appropriately, not in acute distress  Cardiovascular: Regular rate and rhythm, S1/S2, no murmurs, no rubs, no gallops  Respiratory: Clear to auscultation bilaterally, no wheezing, no crackles, no rhonchi  Abdomen: Soft, non tender, non distended, bowel sounds present, no guarding  Extremities: Right knee erythematous and warm to touch but much improved, pulses DP and PT palpable bilaterally  Neuro: Grossly nonfocal  Data Reviewed: Basic Metabolic Panel:  Recent  Labs Lab 11/27/12 0452 11/28/12 0417 11/29/12 0516  NA 127* 127* 131*  K 3.8 4.0 3.9  CL 94* 95* 99  CO2 25 25 26   GLUCOSE 115* 116* 107*  BUN 13 8 4*  CREATININE 0.74 0.64 0.51  CALCIUM 8.8 8.2* 8.5   CBC:  Recent Labs Lab 11/27/12 0452 11/28/12 0417 11/29/12 0516  WBC 12.1* 10.5 10.6*  HGB 8.7* 7.9* 8.5*  HCT 26.8* 24.4* 26.7*  MCV 84.3 85.6 85.3  PLT 411* 386 419*    Recent Results (from the past 240 hour(s))  URINE CULTURE     Status: None   Collection Time    11/27/12  5:18 AM      Result Value Range Status   Specimen Description URINE, CLEAN CATCH   Final   Special Requests NONE   Final   Culture  Setup Time     Final   Value: 11/27/2012 09:44     Performed at [facility] Count     Final   Value: 20,OOO COLONIES/ML     Performed at [facility]   Culture     Final   Value: Multiple bacterial morphotypes present, none predominant. Suggest appropriate recollection if clinically indicated.     Performed at [facility]   Report Status 11/28/2012 FINAL   Final  MRSA PCR SCREENING     Status: None   Collection Time    11/27/12  9:38 AM      Result Value Range Status   MRSA by PCR NEGATIVE  NEGATIVE Final   Comment:            The GeneXpert MRSA Assay (FDA     approved for NASAL specimens     only), is one component of a     comprehensive MRSA colonization     surveillance program. It is not     intended to diagnose MRSA     infection nor to guide or     monitor treatment for     MRSA infections.     Scheduled Meds: . aspirin EC  325 mg Oral Daily  . docusate sodium  100 mg Oral BID  . feeding supplement  237 mL Oral Q24H  . ferrous sulfate  325 mg Oral BID  . fluconazole  200 mg Oral Daily  . gabapentin  300 mg Oral BID  . levothyroxine  150 mcg Oral QAC breakfast  . lidocaine (PF)  5 mL Intradermal Once  . multivitamin with minerals  1 tablet Oral Daily  . oxybutynin  10 mg Oral Daily  . pantoprazole  40 mg  Oral Daily  . simvastatin  20 mg Oral QPC supper  . vancomycin  750 mg Intravenous Q12H   Continuous Infusions: . sodium chloride 50 mL/hr at 11/29/12 0515   [name], MD  [facility] Pager [phone]  If 7PM-7AM, please contact night-coverage [email or web address] Password [email or web address] 11/29/2012, 1:26 PM   LOS: 3 days

## 2012-11-29 NOTE — Progress Notes (Signed)
ANTIBIOTIC CONSULT NOTE - FOLLOW UP  Pharmacy Consult for vancomycin Indication: Right infected knee   Allergies  Allergen Reactions  . Omeprazole Nausea And Vomiting  . Penicillins Itching  . Morphine Rash    And hot flashes  . Xarelto [Rivaroxaban] Other (See Comments)    Bleeding,spitting up blood    Patient Measurements: Height: 5' 5.5" (166.4 cm) Weight: 155 lb (70.308 kg) IBW/kg (Calculated) : 58.15 Adjusted Body Weight:   Vital Signs: Temp: 98.7 F (37.1 C) (09/03 2215) Temp src: Oral (09/03 2215) BP: 162/71 mmHg (09/03 2215) Pulse Rate: 69 (09/03 2215) Intake/Output from previous day: 09/03 0701 - 09/04 0700 In: 930 [P.O.:480; IV Piggyback:450] Out: 3000 [Urine:3000] Intake/Output from this shift: Total I/O In: -  Out: 2100 [Urine:2100]  Labs:  Recent Labs  11/27/12 0452 11/28/12 0417 11/29/12 0516  WBC 12.1* 10.5 10.6*  HGB 8.7* 7.9* 8.5*  PLT 411* 386 419*  CREATININE 0.74 0.64 0.51   Estimated Creatinine Clearance: 55.8 ml/min (by C-G formula based on Cr of 0.51).  Recent Labs  11/29/12 0516  VANCOTROUGH 16.3     Microbiology: Recent Results (from the past 720 hour(s))  URINE CULTURE     Status: None   Collection Time    11/27/12  5:18 AM      Result Value Range Status   Specimen Description URINE, CLEAN CATCH   Final   Special Requests NONE   Final   Culture  Setup Time     Final   Value: 11/27/2012 09:44     Performed at [facility] Count     Final   Value: 20,OOO COLONIES/ML     Performed at [facility]   Culture     Final   Value: Multiple bacterial morphotypes present, none predominant. Suggest appropriate recollection if clinically indicated.     Performed at [facility]   Report Status 11/28/2012 FINAL   Final  MRSA PCR SCREENING     Status: None   Collection Time    11/27/12  9:38 AM      Result Value Range Status   MRSA by PCR NEGATIVE  NEGATIVE Final   Comment:            The  GeneXpert MRSA Assay (FDA     approved for NASAL specimens     only), is one component of a     comprehensive MRSA colonization     surveillance program. It is not     intended to diagnose MRSA     infection nor to guide or     monitor treatment for     MRSA infections.    Anti-infectives   Start     Dose/Rate Route Frequency Ordered Stop   11/29/12 1000  fluconazole (DIFLUCAN) tablet 200 mg  Status:  Discontinued     200 mg Oral Daily 11/29/12 0023 11/29/12 0120   11/29/12 0130  fluconazole (DIFLUCAN) tablet 200 mg     200 mg Oral Daily 11/29/12 0120 12/01/12 0959   11/27/12 0600  vancomycin (VANCOCIN) IVPB 750 mg/150 ml premix     750 mg 150 mL/hr over 60 Minutes Intravenous Every 12 hours 11/27/12 0025        Assessment: Patient with Right infected knee on vancomycin.    Goal of Therapy:  Vancomycin trough level 15-20 mcg/ml  Plan:  Measure antibiotic drug levels at steady state Follow up culture results Continue with current dosing and recheck levels as  needed  Aleene Davidson Crowford 11/29/2012,6:34 AM

## 2012-11-29 NOTE — Progress Notes (Signed)
Subjective: Patient remains very concerned about status of her knee. Pain slightly better.   Objective: Vital signs in last 24 hours: Temp:  [98.2 F (36.8 C)-98.8 F (37.1 C)] 98.8 F (37.1 C) (09/04 0643) Pulse Rate:  [69-85] 75 (09/04 0643) Resp:  [14-18] 14 (09/04 0643) BP: (140-162)/(69-80) 160/69 mmHg (09/04 0643) SpO2:  [95 %-98 %] 95 % (09/04 0643)  Intake/Output from previous day: 09/03 0701 - 09/04 0700 In: 930 [P.O.:480; IV Piggyback:450] Out: 3000 [Urine:3000] Intake/Output this shift:     Recent Labs  11/27/12 0452 11/28/12 0417 11/29/12 0516  HGB 8.7* 7.9* 8.5*    Recent Labs  11/28/12 0417 11/29/12 0516  WBC 10.5 10.6*  RBC 2.85* 3.13*  HCT 24.4* 26.7*  PLT 386 419*    Recent Labs  11/28/12 0417 11/29/12 0516  NA 127* 131*  K 4.0 3.9  CL 95* 99  CO2 25 26  BUN 8 4*  CREATININE 0.64 0.51  GLUCOSE 116* 107*  CALCIUM 8.2* 8.5    Recent Labs  11/27/12 0452  INR 1.30    Wound unchanged. Packed with new wet to dry dressing  Assessment/Plan: Right knee wound dehiscence- Continue antibiotics and dressing changes. Will need home health PT and RN for dressing changes. Will have more help at home tomorrow. May want to keep her one more day for continued observation given tenuous status.   Loanne Drilling 11/29/2012, 7:28 AM

## 2012-11-29 NOTE — Progress Notes (Signed)
Physical Therapy Treatment Patient Details Name: SANDEE BERNATH MRN: [id number] DOB: 01/05/1931 Today's Date: 11/29/2012 Time: 0942-1010 PT Time Calculation (min): 28 min  PT Assessment / Plan / Recommendation  History of Present Illness R TKA in July; post op hematoma with evacutation; wound dehiscence; pt returned home from SNF a few days ago and had a fall, returned to hospital to R/O infection   PT Comments   Pt OOB in recliner.  Assisted to BR to void then amb in hallway.  Pt c/o 5/10 pain with act.  Applied ICE.  Follow Up Recommendations  Home health PT;Supervision - Intermittent     Does the patient have the potential to tolerate intense rehabilitation     Barriers to Discharge        Equipment Recommendations  None recommended by PT    Recommendations for Other Services    Frequency Min 3X/week   Progress towards PT Goals Progress towards PT goals: Progressing toward goals  Plan      Precautions / Restrictions Precautions Precautions: Knee;Fall Restrictions Weight Bearing Restrictions: No    Pertinent Vitals/Pain 5/10 knee pain ICE applied    Mobility  Bed Mobility Bed Mobility: Not assessed Details for Bed Mobility Assistance: pt OOB in recliner Transfers Transfers: Sit to Stand;Stand to Sit Sit to Stand: 4: Min guard;From chair/3-in-1;From toilet Stand to Sit: 4: Min guard;To chair/3-in-1;To toilet Details for Transfer Assistance: <25% VC's on safety and hand placement with stand to sit Ambulation/Gait Ambulation/Gait Assistance: 4: Min guard;5: Supervision Ambulation Distance (Feet): 85 Feet Assistive device: Rolling walker Ambulation/Gait Assistance Details: 25% VC's on safety with turns and upright posture.  Pt required increased time. Gait Pattern: Step-to pattern;Decreased stride length Gait velocity: decr     PT Goals (current goals can now be found in the care plan section)    Visit Information  Last PT Received On: 11/29/12 Assistance  Needed: +1 History of Present Illness: R TKA in July; post op hematoma with evacutation; wound dehiscence; pt returned home from SNF a few days ago and had a fall, returned to hospital to R/O infection    Subjective Data      Cognition       Balance     End of Session PT - End of Session Equipment Utilized During Treatment: Gait belt Activity Tolerance: Patient tolerated treatment well Patient left: in chair;with call bell/phone within reach   Felecia Shelling  PTA [facility]  Acute  Rehab Pager      [phone]

## 2012-11-30 LAB — IRON AND TIBC
Iron: 24 ug/dL — ABNORMAL LOW (ref 42–135)
Saturation Ratios: 17 % — ABNORMAL LOW (ref 20–55)
TIBC: 140 ug/dL — ABNORMAL LOW (ref 250–470)
UIBC: 116 ug/dL — ABNORMAL LOW (ref 125–400)

## 2012-11-30 LAB — BASIC METABOLIC PANEL
BUN: 6 mg/dL (ref 6–23)
Calcium: 8.6 mg/dL (ref 8.4–10.5)
Chloride: 97 mEq/L (ref 96–112)
Creatinine, Ser: 0.58 mg/dL (ref 0.50–1.10)
GFR calc Af Amer: >90 mL/min (ref >90)
GFR calc non Af Amer: 85 mL/min — ABNORMAL LOW (ref >90)

## 2012-11-30 LAB — FERRITIN: Ferritin: 687 ng/mL — ABNORMAL HIGH (ref 10–291)

## 2012-11-30 LAB — FOLATE: Folate: 15.2 ng/mL

## 2012-11-30 LAB — CBC
HCT: 24 % — ABNORMAL LOW (ref 36.0–46.0)
MCH: 27.5 pg (ref 26.0–34.0)
MCHC: 32.1 g/dL (ref 30.0–36.0)
MCV: 85.7 fL (ref 78.0–100.0)
Platelets: 418 10*3/uL — ABNORMAL HIGH (ref 150–400)
RDW: 16.4 % — ABNORMAL HIGH (ref 11.5–15.5)

## 2012-11-30 MED ORDER — CEPHALEXIN 500 MG PO CAPS
500.0000 mg | ORAL_CAPSULE | Freq: Four times a day (QID) | ORAL | Status: DC
  Administered 2012-11-30 – 2012-12-03 (×12): 500 mg via ORAL
  Filled 2012-11-30 (×16): qty 1

## 2012-11-30 MED ORDER — SULFAMETHOXAZOLE-TMP DS 800-160 MG PO TABS
1.0000 | ORAL_TABLET | Freq: Two times a day (BID) | ORAL | Status: DC
  Administered 2012-11-30 – 2012-12-03 (×7): 1 via ORAL
  Filled 2012-11-30 (×8): qty 1

## 2012-11-30 MED ORDER — FUROSEMIDE 10 MG/ML IJ SOLN
20.0000 mg | Freq: Once | INTRAMUSCULAR | Status: AC
  Administered 2012-11-30: 20:00:00 20 mg via INTRAVENOUS
  Filled 2012-11-30: qty 2

## 2012-11-30 NOTE — Progress Notes (Signed)
11/30/2012 Colleen Can BSN RN CCM [phone] Pt receiving blood transfusion. Hct-7.7, hgb-24.0; Pt active with [facility]; doctor will need to reorder [facility] services if needed. Pt will have support of several family members who are health care professionals.

## 2012-11-30 NOTE — Progress Notes (Addendum)
Patient ID: [name] [name], female   DOB: 1930/11/07, 79 y.o.   MRN: [id number] [facility] PROGRESS NOTE  [name] [name]:[id number] DOB: Aug 15, 1930 DOA: 11/26/2012 PCP: Karlene Einstein, MD  Brief narrative:  79 y.o. year-old female with history of CAD, atrial fibrillation not on anticoagulation due to bleeding into knee and h/o GIB, hypothyroidism, arthritis, who underwent right total knee arthroplasty by Dr. Lequita Halt on 10/08/12. Her post-operative course was complicated by post-operative hematoma requiring I&D on 7/17. She was discharged on doxycycline to rehab and subsequently had wound dehiscence and has been treated with wet to dry dressing. She was discharged to home a few days prior to this admission, but developed increased weakness and had a fall the night prior to admission. She had lower back pain and pain in the right upper leg and knee post-fall. The home health nurse noticed her knee was appearing more erythematous and she was referred to the ER. She was seen at [facility] ER where it was noted her right knee was red, swollen and painful. WBC 20.7, hgb 9.3, plt 386, Na 120, K 3.6, chloride 88, bicarb 23, BUN 11, cr 0.76. XR lumbar spine: Severe degenerative changes and scoliosis, no acute findings. XR right femur: No acute bony abnormality, moderate joint effusion of the right knee.   Principal Problem:  Pain in right knee  - appreciate ortho team assistance  - Right knee wound dehiscence, right knee aspiration done 9/3 with minimal fluid obtained after multiple passes  - wound repacked 9/4, continue ambulation as pt able to tolerate  - continue analgesia as needed  Active Problems:  Leukocytosis  - secondary to an infection  - continue Vancomycin day #5 - will transition to Bactrim PO and Keflex  Essential hypertension, benign  - reasonable inpatient control  Atrial fibrillation  - rate controlled and in sinus rhythm, no anticoagulation candidate due to history of GI bleed  -  continue aspirin  Hyponatremia  - secondary to pre renal etiology  - NS IVF provided and pt responded well, Na trending up 127 --> 131 this AM  - repeat BMP in AM  PVC's (premature ventricular contractions)  - clinically asymptomatic, continue to monitor on telemetry  Surgical wound infection  - appreciate ortho assistance  - continue Vancomycin day #5, plan on transitioning to oral ABX Bactrim today and keflex  Hypothyroidism  - continue synthroid  Anemia of chronic disease and iron deficiency anemia, acute blood loss anemia   - slight drop in Hg but no signs of overt bleeding, possibly post op related but not clear at this time   - transfuse 2 units PRBC, check FOBT - repeat CBC in AM  GERD  - continue PPI   Consultants:  Ortho Procedures/Studies:  9/3 --> right knee aspiration  Antibiotics:  Vancomycin 9/1 --> 9/5 Bactrim 9/5 --> Keflex 9/5 -->  Code Status: Full  Family Communication: Pt at bedside  Disposition Plan: Home when medically stable   HPI/Subjective: No events overnight.   Objective: Filed Vitals:   11/29/12 0643 11/29/12 1345 11/29/12 2258 11/30/12 0657  BP: 160/69 150/66 138/72 127/61  Pulse: 75 78 72 63  Temp: 98.8 F (37.1 C) 98.6 F (37 C) 98.8 F (37.1 C) 98.1 F (36.7 C)  TempSrc: Oral Oral Oral Oral  Resp: 14 16 14 14   Height:      Weight:      SpO2: 95% 98% 97% 99%    Intake/Output Summary (Last 24 hours) at 11/30/12 0843 Last  data filed at 11/30/12 0219  Gross per 24 hour  Intake 2491.67 ml  Output   2400 ml  Net  91.67 ml    Exam:   General:  Pt is alert, follows commands appropriately, not in acute distress, pale   Cardiovascular: Regular rate and rhythm, S1/S2, SEM 4/6, no rubs, no gallops  Respiratory: Clear to auscultation bilaterally, no wheezing, no crackles, no rhonchi  Abdomen: Soft, non tender, non distended, bowel sounds present, no guarding  Extremities: No edema, pulses DP and PT palpable  bilaterally  Neuro: Grossly nonfocal  Data Reviewed: Basic Metabolic Panel:  Recent Labs Lab 11/27/12 0452 11/28/12 0417 11/29/12 0516 11/30/12 0422  NA 127* 127* 131* 131*  K 3.8 4.0 3.9 4.0  CL 94* 95* 99 97  CO2 25 25 26 30   GLUCOSE 115* 116* 107* 95  BUN 13 8 4* 6  CREATININE 0.74 0.64 0.51 0.58  CALCIUM 8.8 8.2* 8.5 8.6   CBC:  Recent Labs Lab 11/27/12 0452 11/28/12 0417 11/29/12 0516 11/30/12 0422  WBC 12.1* 10.5 10.6* 9.4  HGB 8.7* 7.9* 8.5* 7.7*  HCT 26.8* 24.4* 26.7* 24.0*  MCV 84.3 85.6 85.3 85.7  PLT 411* 386 419* 418*     Recent Results (from the past 240 hour(s))  URINE CULTURE     Status: None   Collection Time    11/27/12  5:18 AM      Result Value Range Status   Specimen Description URINE, CLEAN CATCH   Final   Special Requests NONE   Final   Culture  Setup Time     Final   Value: 11/27/2012 09:44     Performed at [facility] Count     Final   Value: 20,OOO COLONIES/ML     Performed at [facility]   Culture     Final   Value: Multiple bacterial morphotypes present, none predominant. Suggest appropriate recollection if clinically indicated.     Performed at [facility]   Report Status 11/28/2012 FINAL   Final  MRSA PCR SCREENING     Status: None   Collection Time    11/27/12  9:38 AM      Result Value Range Status   MRSA by PCR NEGATIVE  NEGATIVE Final   Comment:            The GeneXpert MRSA Assay (FDA     approved for NASAL specimens     only), is one component of a     comprehensive MRSA colonization     surveillance program. It is not     intended to diagnose MRSA     infection nor to guide or     monitor treatment for     MRSA infections.     Scheduled Meds: . aspirin EC  325 mg Oral Daily  . docusate sodium  100 mg Oral BID  . feeding supplement  237 mL Oral Q24H  . ferrous sulfate  325 mg Oral BID  . fluconazole  200 mg Oral Daily  . gabapentin  300 mg Oral BID  . levothyroxine   150 mcg Oral QAC breakfast  . lidocaine (PF)  5 mL Intradermal Once  . multivitamin with minerals  1 tablet Oral Daily  . oxybutynin  10 mg Oral Daily  . pantoprazole  40 mg Oral Daily  . simvastatin  20 mg Oral QPC supper  . vancomycin  750 mg Intravenous Q12H   Continuous Infusions: .  sodium chloride 50 mL/hr at 11/29/12 2110     [name], MD  [facility] Pager [phone]  If 7PM-7AM, please contact night-coverage [email or web address] Password [email or web address] 11/30/2012, 8:43 AM   LOS: 4 days

## 2012-11-30 NOTE — Progress Notes (Signed)
PT Cancellation Note  Patient Details Name: [name] [name] MRN: [id number] DOB: 14-Nov-1930   Cancelled Treatment:    Reason Eval/Treat Not Completed: Fatigue/lethargy limiting ability to participate (getting blood.)   Rada Hay 11/30/2012, 3:48 PM

## 2012-11-30 NOTE — Clinical Documentation Improvement (Signed)
THIS DOCUMENT IS NOT A PERMANENT PART OF THE MEDICAL RECORD  Please update your documentation with the medical record to reflect your response to this query. If you need help knowing how to do this please call [phone].  11/30/12  Dear Dr. Izola Price, I [name] [name]  In an effort to better capture your patient's severity of illness, reflect appropriate length of stay and utilization of resources, a review of the patient medical record has revealed the following indicators.    Based on your clinical judgment, please clarify and document in a progress note and/or discharge summary the clinical condition associated with the following supporting information:  In responding to this query please exercise your independent judgment.  The fact that a query is asked, does not imply that any particular answer is desired or expected.   Pt noted to have Iron deficiency anemia with stable Hgb  Currently, pt has abnormal post op H/H=7.0/24.0  Clarification Needed   Please clarify if abnormal post op H/H can be further specified as one of the diagnoses listed below and document in pn or d/c summary.      Possible Clinical Conditions?   " Expected Acute Blood Loss Anemia  " Acute Blood Loss Anemia " Acute on chronic blood loss anemia  " Chronic blood loss anemia  " Precipitous drop in Hematocrit  " Other Condition________________  " Cannot Clinically Determine  Risk Factors: (recent surgery, pre op anemia, EBL in OR)  Supporting Information:  Signs and Symptoms   wound dehiscence   post-operative hematoma   Septic arthritis  AFIB  Hyponatremia  Hypothyroidism  HTN   Diagnostics: Component Hemoglobin HCT  Latest Ref Rng 12.0 - 15.0 g/dL 09.8 - 11.9 %  04/01/7827 8.7 (L) 26.8 (L)  11/28/2012 7.9 (L) 24.4 (L)  11/29/2012 8.5 (L) 26.7 (L)  11/30/2012 7.7 (L) 24.0 (L)  : Treatments: Transfusion: Order to Transfuse RBC (Order  Monitoring  Reviewed: documented in PN  11/30/2012  Thank You,  Enis Slipper  RN, BSN, MSN/Inf, CCDS Clinical Documentation Specialist [facility] Dept Pager: [phone] / E-mail: [email or web address]  [phone]  Health Information Management [place]

## 2012-11-30 NOTE — Progress Notes (Signed)
   Subjective:     Patient reports pain as mild.   Patient seen in rounds with Dr. Lequita Halt. Patient is well, but has had some minor complaints of fatigue. She reports that the pain is under good control but she is feeling very tired and weak today. No SOB or chest pain.  Plan is to go Home after hospital stay.  Objective: Vital signs in last 24 hours: Temp:  [98.1 F (36.7 C)-98.8 F (37.1 C)] 98.1 F (36.7 C) (09/05 0657) Pulse Rate:  [63-78] 63 (09/05 0657) Resp:  [14-16] 14 (09/05 0657) BP: (127-150)/(61-72) 127/61 mmHg (09/05 0657) SpO2:  [97 %-99 %] 99 % (09/05 0657)  Intake/Output from previous day:  Intake/Output Summary (Last 24 hours) at 11/30/12 0833 Last data filed at 11/30/12 0219  Gross per 24 hour  Intake 2491.67 ml  Output   2400 ml  Net  91.67 ml     Labs:  Recent Labs  11/28/12 0417 11/29/12 0516 11/30/12 0422  HGB 7.9* 8.5* 7.7*    Recent Labs  11/29/12 0516 11/30/12 0422  WBC 10.6* 9.4  RBC 3.13* 2.80*  HCT 26.7* 24.0*  PLT 419* 418*    Recent Labs  11/29/12 0516 11/30/12 0422  NA 131* 131*  K 3.9 4.0  CL 99 97  CO2 26 30  BUN 4* 6  CREATININE 0.51 0.58  GLUCOSE 107* 95  CALCIUM 8.5 8.6    EXAM General - Patient is Alert and Oriented Extremity - Neurologically intact Dorsiflexion/Plantar flexion intact Dressing/Incision - clean, dry, no drainage Motor Function - intact, moving foot and toes well on exam.   Past Medical History  Diagnosis Date  . Mixed hyperlipidemia   . Hypothyroidism   . GERD (gastroesophageal reflux disease)   . Atrial fibrillation     Remote amiodarone use, recurred in 07/2011 post op from knee arthroplasty  . Obese   . Asymptomatic cholelithiasis   . History of blood transfusion 08/31/2011    A+ type  . History of GI bleed     Gastritis, was on Xarelto and ASA  . Carpal tunnel syndrome   . Arthritis     Knees  . DJD (degenerative joint disease), thoracolumbar   . Rheumatoid arthritis   . H/O  bronchitis   . Hemorrhoids   . Urinary incontinence   . Measles     as child  . Mumps     as child  . Carotid artery disease   . PONV (postoperative nausea and vomiting)     Assessment/Plan:     Principal Problem:   Pain in right knee Active Problems:   Essential hypertension, benign   Atrial fibrillation   Hyponatremia   PVC's (premature ventricular contractions)   Surgical wound infection   Hypothyroidism  Estimated body mass index is 25.39 kg/(m^2) as calculated from the following:   Height as of this encounter: 5' 5.5" (1.664 m).   Weight as of this encounter: 70.308 kg (155 lb). Advance diet Up with therapy once transfusion complete Weight-Bearing as tolerated   She is improving in regards to the knee itself but is feeling quite fatigued and weak. Her Hgb is 7.7 this morning. Will transfuse 2 units today. If she tolerates well, she is cleared from an orthopedic standpoint to be DC.   Demonte Dobratz LAUREN 11/30/2012, 8:33 AM

## 2012-12-01 LAB — TYPE AND SCREEN: Unit division: 0

## 2012-12-01 LAB — BASIC METABOLIC PANEL
BUN: 9 mg/dL (ref 6–23)
CO2: 31 mEq/L (ref 19–32)
Calcium: 8.5 mg/dL (ref 8.4–10.5)
Chloride: 92 mEq/L — ABNORMAL LOW (ref 96–112)
Creatinine, Ser: 0.77 mg/dL (ref 0.50–1.10)
GFR calc Af Amer: 90 mL/min — ABNORMAL LOW (ref >90)
GFR calc non Af Amer: 77 mL/min — ABNORMAL LOW (ref >90)
Glucose, Bld: 102 mg/dL — ABNORMAL HIGH (ref 70–99)
Potassium: 4.6 mEq/L (ref 3.5–5.1)
Sodium: 129 mEq/L — ABNORMAL LOW (ref 135–145)

## 2012-12-01 LAB — CBC
HCT: 31 % — ABNORMAL LOW (ref 36.0–46.0)
Hemoglobin: 10.1 g/dL — ABNORMAL LOW (ref 12.0–15.0)
MCH: 27.5 pg (ref 26.0–34.0)
MCHC: 32.6 g/dL (ref 30.0–36.0)
MCV: 84.5 fL (ref 78.0–100.0)
Platelets: 375 10*3/uL (ref 150–400)
RBC: 3.67 MIL/uL — ABNORMAL LOW (ref 3.87–5.11)
RDW: 16.9 % — ABNORMAL HIGH (ref 11.5–15.5)
WBC: 10.2 10*3/uL (ref 4.0–10.5)

## 2012-12-01 MED ORDER — NON FORMULARY: 1.0000 | Freq: Every day | Status: DC

## 2012-12-01 MED ORDER — ESOMEPRAZOLE MAGNESIUM 40 MG PO CPDR
40.0000 mg | DELAYED_RELEASE_CAPSULE | Freq: Every day | ORAL | Status: DC
  Administered 2012-12-01 – 2012-12-02 (×2): 40 mg via ORAL
  Filled 2012-12-01 (×3): qty 1

## 2012-12-01 NOTE — Progress Notes (Signed)
Patient ID: [name] [name], female   DOB: 1931-03-12, 79 y.o.   MRN: [id number] [facility] PROGRESS NOTE  AVELYNN SELLIN NWG:[id number] DOB: Nov 06, 1930 DOA: 11/26/2012 PCP: Karlene Einstein, MD  Brief narrative:  79 y.o. year-old female with history of CAD, atrial fibrillation not on anticoagulation due to bleeding into knee and h/o GIB, hypothyroidism, arthritis, who underwent right total knee arthroplasty by Dr. Lequita Halt on 10/08/12. Her post-operative course was complicated by post-operative hematoma requiring I&D on 7/17. She was discharged on doxycycline to rehab and subsequently had wound dehiscence and has been treated with wet to dry dressing. She was discharged to home a few days prior to this admission, but developed increased weakness and had a fall the night prior to admission. She had lower back pain and pain in the right upper leg and knee post-fall. The home health nurse noticed her knee was appearing more erythematous and she was referred to the ER. She was seen at [facility] ER where it was noted her right knee was red, swollen and painful. WBC 20.7, hgb 9.3, plt 386, Na 120, K 3.6, chloride 88, bicarb 23, BUN 11, cr 0.76. XR lumbar spine: Severe degenerative changes and scoliosis, no acute findings. XR right femur: No acute bony abnormality, moderate joint effusion of the right knee.   Principal Problem:  Pain in right knee  - appreciate ortho team assistance  - Right knee wound dehiscence, right knee aspiration done 9/3 with minimal fluid obtained after multiple passes  - wound repacked 9/4, continue ambulation as pt able to tolerate  - continue analgesia as needed  Active Problems:  Leukocytosis  - secondary to an infection  - continued Vancomycin day #5  - Bactrim PO and Keflex day #2 Essential hypertension, benign  - reasonable inpatient control  Atrial fibrillation  - rate controlled and in sinus rhythm, no anticoagulation candidate due to history of GI bleed  - continue  aspirin  Hyponatremia  - secondary to pre renal etiology  - NS IVF provided and pt responded well, Na trending up 127 --> 131 --> 129 - repeat BMP in AM  PVC's (premature ventricular contractions)  - clinically asymptomatic, continue to monitor on telemetry  Surgical wound infection  - appreciate ortho assistance  - continue Vancomycin day #5, continue Bactrim and keflex day #2 Hypothyroidism  - continue synthroid  Anemia of chronic disease and iron deficiency anemia, acute blood loss anemia  - slight drop in Hg but no signs of overt bleeding, possibly post op related but not clear at this time  - 2 units PRBC 11/30/2012 with appropriate increase in Hg, FOBT pending  - repeat CBC in AM  GERD  - continue PPI    Consultants:  Ortho Procedures/Studies:  9/3 --> right knee aspiration  Antibiotics:  Vancomycin 9/1 --> 9/5  Bactrim 9/5 -->  Keflex 9/5 -->  Code Status: Full  Family Communication: Pt at bedside  Disposition Plan: Plan d/c in AM  HPI/Subjective: No events overnight.   Objective: Filed Vitals:   11/30/12 1635 11/30/12 1733 11/30/12 1845 11/30/12 2236  BP: 169/77 177/71 164/70 128/69  Pulse: 81 85 86 70  Temp: 99.5 F (37.5 C) 99.9 F (37.7 C) 99.9 F (37.7 C) 98.5 F (36.9 C)  TempSrc: Oral Oral Oral Oral  Resp: 16 17 17 14   Height:      Weight:      SpO2:    98%    Intake/Output Summary (Last 24 hours) at 12/01/12 0654 Last data  filed at 12/01/12 0200  Gross per 24 hour  Intake   1410 ml  Output   3755 ml  Net  -2345 ml    Exam:   General:  Pt is alert, follows commands appropriately, not in acute distress  Cardiovascular: Regular rate and rhythm, S1/S2, SEM 5/6, no rubs, no gallops  Respiratory: Clear to auscultation bilaterally, no wheezing, no crackles, no rhonchi  Abdomen: Soft, non tender, non distended, bowel sounds present, no guarding  Extremities: pulses DP and PT palpable bilaterally  Neuro: Grossly nonfocal  Data  Reviewed: Basic Metabolic Panel:  Recent Labs Lab 11/27/12 0452 11/28/12 0417 11/29/12 0516 11/30/12 0422 12/01/12 0523  NA 127* 127* 131* 131* 129*  K 3.8 4.0 3.9 4.0 4.6  CL 94* 95* 99 97 92*  CO2 25 25 26 30 31   GLUCOSE 115* 116* 107* 95 102*  BUN 13 8 4* 6 9  CREATININE 0.74 0.64 0.51 0.58 0.77  CALCIUM 8.8 8.2* 8.5 8.6 8.5   CBC:  Recent Labs Lab 11/27/12 0452 11/28/12 0417 11/29/12 0516 11/30/12 0422 12/01/12 0523  WBC 12.1* 10.5 10.6* 9.4 10.2  HGB 8.7* 7.9* 8.5* 7.7* 10.1*  HCT 26.8* 24.4* 26.7* 24.0* 31.0*  MCV 84.3 85.6 85.3 85.7 84.5  PLT 411* 386 419* 418* 375     Recent Results (from the past 240 hour(s))  URINE CULTURE     Status: None   Collection Time    11/27/12  5:18 AM      Result Value Range Status   Specimen Description URINE, CLEAN CATCH   Final   Special Requests NONE   Final   Culture  Setup Time     Final   Value: 11/27/2012 09:44     Performed at [facility] Count     Final   Value: 20,OOO COLONIES/ML     Performed at [facility]   Culture     Final   Value: Multiple bacterial morphotypes present, none predominant. Suggest appropriate recollection if clinically indicated.     Performed at [facility]   Report Status 11/28/2012 FINAL   Final  MRSA PCR SCREENING     Status: None   Collection Time    11/27/12  9:38 AM      Result Value Range Status   MRSA by PCR NEGATIVE  NEGATIVE Final   Comment:            The GeneXpert MRSA Assay (FDA     approved for NASAL specimens     only), is one component of a     comprehensive MRSA colonization     surveillance program. It is not     intended to diagnose MRSA     infection nor to guide or     monitor treatment for     MRSA infections.     Scheduled Meds: . aspirin EC  325 mg Oral Daily  . cephALEXin  500 mg Oral Q6H  . docusate sodium  100 mg Oral BID  . feeding supplement  237 mL Oral Q24H  . ferrous sulfate  325 mg Oral BID  . gabapentin   300 mg Oral BID  . levothyroxine  150 mcg Oral QAC breakfast  . lidocaine (PF)  5 mL Intradermal Once  . multivitamin with minerals  1 tablet Oral Daily  . oxybutynin  10 mg Oral Daily  . pantoprazole  40 mg Oral Daily  . simvastatin  20 mg Oral  QPC supper  . sulfamethoxazole-trimethoprim  1 tablet Oral Q12H   Continuous Infusions:    [name], MD  [facility] Pager [phone]  If 7PM-7AM, please contact night-coverage [email or web address] Password [email or web address] 12/01/2012, 6:54 AM   LOS: 5 days

## 2012-12-01 NOTE — Progress Notes (Signed)
Transferred to [address] Delanee Xin, [employer]

## 2012-12-01 NOTE — Progress Notes (Signed)
   Subjective:     S/p wound opening to right knee Pt c/o minimal pain but otherwise doing okay Plan for snf placement once medically stable   Patient reports pain as mild.  Objective:   VITALS:   Filed Vitals:   12/01/12 0702  BP: 129/72  Pulse: 65  Temp: 98.2 F (36.8 C)  Resp: 16    Right knee: small open area over patellar tendon 2x3cm nv intact distally No drainage or erythema Wet to dry dressing changed No rashes seen   LABS  Recent Labs  11/29/12 0516 11/30/12 0422 12/01/12 0523  HGB 8.5* 7.7* 10.1*  HCT 26.7* 24.0* 31.0*  WBC 10.6* 9.4 10.2  PLT 419* 418* 375     Recent Labs  11/29/12 0516 11/30/12 0422 12/01/12 0523  NA 131* 131* 129*  K 3.9 4.0 4.6  BUN 4* 6 9  CREATININE 0.51 0.58 0.77  GLUCOSE 107* 95 102*     Assessment/Plan:    right knee open wound Continue daily dressing changes  May d/c to snf once medically stable and follow up with our office weekly Patient in agreement     Alphonsa Overall, MPAS, PA-C  12/01/2012, 7:30 AM

## 2012-12-01 NOTE — Progress Notes (Signed)
Physical Therapy Treatment Patient Details Name: [name] [name] MRN: [id number] DOB: 1930/09/18 Today's Date: 12/01/2012 Time: 0865-7846 PT Time Calculation (min): 23 min  PT Assessment / Plan / Recommendation  History of Present Illness R TKA in July; post op hematoma with evacutation; wound dehiscence; pt returned home from SNF a few days ago and had a fall, returned to hospital to R/O infection   PT Comments   Pt reports feeling much stronger today after receiving blood . Pt continues to benefit from PT to improve safety as pt lives alone and had a fall at home.  Follow Up Recommendations  Home health PT;Supervision - Intermittent     Does the patient have the potential to tolerate intense rehabilitation     Barriers to Discharge        Equipment Recommendations  None recommended by PT    Recommendations for Other Services    Frequency Min 3X/week   Progress towards PT Goals Progress towards PT goals: Progressing toward goals  Plan Current plan remains appropriate    Precautions / Restrictions Precautions Precautions: Knee;Fall   Pertinent Vitals/Pain 6 R knee, premedicated , ice applied.    Mobility  Bed Mobility Bed Mobility: Not assessed Transfers Sit to Stand: 4: Min guard;From chair/3-in-1;From toilet Stand to Sit: 4: Min guard;To chair/3-in-1;To toilet Ambulation/Gait Ambulation/Gait Assistance: 4: Min guard;5: Supervision Ambulation Distance (Feet): 250 Feet Assistive device: Rolling walker Ambulation/Gait Assistance Details: cues for sequence,  and for safe turns. Gait Pattern: Step-to pattern;Decreased stride length;Antalgic Gait velocity: decr    Exercises Total Joint Exercises Straight Leg Raises: AROM;Right;10 reps Long Arc Quad: AROM;Right;10 reps   PT Diagnosis:    PT Problem List:   PT Treatment Interventions:     PT Goals (current goals can now be found in the care plan section)    Visit Information  Last PT Received On: 12/01/12 Assistance  Needed: +1 History of Present Illness: R TKA in July; post op hematoma with evacutation; wound dehiscence; pt returned home from SNF a few days ago and had a fall, returned to hospital to R/O infection    Subjective Data      Cognition  Cognition Arousal/Alertness: Awake/alert    Balance     End of Session PT - End of Session Activity Tolerance: Patient tolerated treatment well Patient left: in chair;with call bell/phone within reach Nurse Communication: Mobility status   GP     Rada Hay 12/01/2012, 12:56 PM

## 2012-12-02 ENCOUNTER — Inpatient Hospital Stay (HOSPITAL_COMMUNITY): Payer: Medicare Other

## 2012-12-02 LAB — CBC
Hemoglobin: 10.4 g/dL — ABNORMAL LOW (ref 12.0–15.0)
MCH: 27.3 pg (ref 26.0–34.0)
MCHC: 32.6 g/dL (ref 30.0–36.0)
RDW: 16.8 % — ABNORMAL HIGH (ref 11.5–15.5)

## 2012-12-02 LAB — BASIC METABOLIC PANEL
Calcium: 8.4 mg/dL (ref 8.4–10.5)
GFR calc Af Amer: 79 mL/min — ABNORMAL LOW (ref >90)
GFR calc non Af Amer: 68 mL/min — ABNORMAL LOW (ref >90)
Glucose, Bld: 97 mg/dL (ref 70–99)
Sodium: 126 mEq/L — ABNORMAL LOW (ref 135–145)

## 2012-12-02 MED ORDER — SODIUM CHLORIDE 0.9 % IV SOLN
INTRAVENOUS | Status: DC
  Administered 2012-12-02: 10:00:00 via INTRAVENOUS

## 2012-12-02 NOTE — Plan of Care (Signed)
Problem: Discharge Progression Outcomes Goal: Tubes and drains discontinued if indicated Outcome: Not Applicable Date Met:  12/02/12 Has infusing iv

## 2012-12-02 NOTE — Progress Notes (Signed)
Patient ID: [name] [name], female   DOB: 08/11/1930, 79 y.o.   MRN: [id number] [facility] PROGRESS NOTE  [name] [name]:[id number] DOB: 10/03/1930 DOA: 11/26/2012 PCP: Karlene Einstein, MD  Brief narrative:  79 y.o. year-old female with history of CAD, atrial fibrillation not on anticoagulation due to bleeding into knee and h/o GIB, hypothyroidism, arthritis, who underwent right total knee arthroplasty by Dr. Lequita Halt on 10/08/12. Her post-operative course was complicated by post-operative hematoma requiring I&D on 7/17. She was discharged on doxycycline to rehab and subsequently had wound dehiscence and has been treated with wet to dry dressing. She was discharged to home a few days prior to this admission, but developed increased weakness and had a fall the night prior to admission. She had lower back pain and pain in the right upper leg and knee post-fall. The home health nurse noticed her knee was appearing more erythematous and she was referred to the ER. She was seen at [facility] where it was noted her right knee was red, swollen and painful. WBC 20.7, hgb 9.3, plt 386, Na 120, K 3.6, chloride 88, bicarb 23, BUN 11, cr 0.76. XR lumbar spine: Severe degenerative changes and scoliosis, no acute findings. XR right femur: No acute bony abnormality, moderate joint effusion of the right knee.   Principal Problem:  Pain in right knee  - appreciate ortho team assistance  - Right knee wound dehiscence, right knee aspiration done 9/3 with minimal fluid obtained after multiple passes  - wound repacked 9/4, continue ambulation as pt able to tolerate  - continue analgesia as needed  Active Problems:  Low grade fever - unclear etiology, pt reports intermittent cough - obtain CXR today and UA Leukocytosis  - secondary to an infection  - continued Vancomycin day #5 - Bactrim PO and Keflex day #3 Essential hypertension, benign  - reasonable inpatient control  Atrial fibrillation  - rate  controlled and in sinus rhythm, no anticoagulation candidate due to history of GI bleed  - continue aspirin  Hyponatremia  - secondary to pre renal etiology  - NS IVF provided and pt responded well, Na trending up 127 --> 131 --> 129 --126 - place on IVF for next 12 hours  - repeat BMP in AM  PVC's (premature ventricular contractions)  - clinically asymptomatic, continue to monitor on telemetry  Surgical wound infection  - appreciate ortho assistance  - continue Vancomycin day #5, continue Bactrim and keflex day #3 Hypothyroidism  - continue synthroid  Anemia of chronic disease and iron deficiency anemia, acute blood loss anemia  - slight drop in Hg but no signs of overt bleeding, possibly post op related but not clear at this time  - 2 units PRBC 11/30/2012 with appropriate increase in Hg - repeat CBC in AM  GERD  - continue PPI   Consultants:  Ortho Procedures/Studies:  9/3 --> right knee aspiration  Antibiotics:  Vancomycin 9/1 --> 9/5  Bactrim 9/5 -->  Keflex 9/5 -->  Code Status: Full  Family Communication: Pt at bedside  Disposition Plan: Plan d/c in AM   HPI/Subjective: No events overnight.   Objective: Filed Vitals:   12/01/12 0702 12/01/12 1431 12/01/12 2207 12/02/12 0535  BP: 129/72 124/60 151/73 107/64  Pulse: 65 71 79 81  Temp: 98.2 F (36.8 C) 100 F (37.8 C) 99.9 F (37.7 C) 99.2 F (37.3 C)  TempSrc: Oral Oral Oral Oral  Resp: 16 16 16 16   Height:      Weight:  SpO2: 95% 95% 93% 93%    Intake/Output Summary (Last 24 hours) at 12/02/12 0653 Last data filed at 12/02/12 0535  Gross per 24 hour  Intake    720 ml  Output   2625 ml  Net  -1905 ml    Exam:   General:  Pt is alert, follows commands appropriately, not in acute distress  Cardiovascular: Regular rate and rhythm, S1/S2, no murmurs, no rubs, no gallops  Respiratory: Clear to auscultation bilaterally, no wheezing, no crackles, no rhonchi  Abdomen: Soft, non tender, non  distended, bowel sounds present, no guarding  Extremities:  pulses DP and PT palpable bilaterally  Neuro: Grossly nonfocal  Data Reviewed: Basic Metabolic Panel:  Recent Labs Lab 11/28/12 0417 11/29/12 0516 11/30/12 0422 12/01/12 0523 12/02/12 0455  NA 127* 131* 131* 129* 126*  K 4.0 3.9 4.0 4.6 4.6  CL 95* 99 97 92* 91*  CO2 25 26 30 31 26   GLUCOSE 116* 107* 95 102* 97  BUN 8 4* 6 9 10   CREATININE 0.64 0.51 0.58 0.77 0.80  CALCIUM 8.2* 8.5 8.6 8.5 8.4   Liver Function Tests: No results found for this basename: AST, ALT, ALKPHOS, BILITOT, PROT, ALBUMIN,  in the last 168 hours No results found for this basename: LIPASE, AMYLASE,  in the last 168 hours No results found for this basename: AMMONIA,  in the last 168 hours CBC:  Recent Labs Lab 11/28/12 0417 11/29/12 0516 11/30/12 0422 12/01/12 0523 12/02/12 0455  WBC 10.5 10.6* 9.4 10.2 10.6*  HGB 7.9* 8.5* 7.7* 10.1* 10.4*  HCT 24.4* 26.7* 24.0* 31.0* 31.9*  MCV 85.6 85.3 85.7 84.5 83.7  PLT 386 419* 418* 375 401*   Recent Results (from the past 240 hour(s))  URINE CULTURE     Status: None   Collection Time    11/27/12  5:18 AM      Result Value Range Status   Specimen Description URINE, CLEAN CATCH   Final   Special Requests NONE   Final   Culture  Setup Time     Final   Value: 11/27/2012 09:44     Performed at [facility] Count     Final   Value: 20,OOO COLONIES/ML     Performed at [facility]   Culture     Final   Value: Multiple bacterial morphotypes present, none predominant. Suggest appropriate recollection if clinically indicated.     Performed at [facility]   Report Status 11/28/2012 FINAL   Final  MRSA PCR SCREENING     Status: None   Collection Time    11/27/12  9:38 AM      Result Value Range Status   MRSA by PCR NEGATIVE  NEGATIVE Final   Comment:            The GeneXpert MRSA Assay (FDA     approved for NASAL specimens     only), is one component of a      comprehensive MRSA colonization     surveillance program. It is not     intended to diagnose MRSA     infection nor to guide or     monitor treatment for     MRSA infections.     Scheduled Meds: . aspirin EC  325 mg Oral Daily  . cephALEXin  500 mg Oral Q6H  . docusate sodium  100 mg Oral BID  . esomeprazole  40 mg Oral Q1200  . feeding  supplement  237 mL Oral Q24H  . ferrous sulfate  325 mg Oral BID  . gabapentin  300 mg Oral BID  . levothyroxine  150 mcg Oral QAC breakfast  . lidocaine (PF)  5 mL Intradermal Once  . multivitamin with minerals  1 tablet Oral Daily  . oxybutynin  10 mg Oral Daily  . simvastatin  20 mg Oral QPC supper  . sulfamethoxazole-trimethoprim  1 tablet Oral Q12H   Continuous Infusions:    [name], MD  [facility] Pager [phone]  If 7PM-7AM, please contact night-coverage [email or web address] Password [facility] 12/02/2012, 6:53 AM   LOS: 6 days

## 2012-12-02 NOTE — Progress Notes (Signed)
Physical Therapy Treatment Patient Details Name: [name] [name] MRN: [id number] DOB: 11/15/1930 Today's Date: 12/02/2012 Time: 4098-1191 PT Time Calculation (min): 18 min  PT Assessment / Plan / Recommendation  History of Present Illness R TKA in July; post op hematoma with evacutation; wound dehiscence; pt returned home from SNF a few days ago and had a fall, returned to hospital to R/O infection   PT Comments   Pt is improving in safe gait, Verbal cues for turns, stay inside RW. Pt plans DC to home with intermittent supervision.  Follow Up Recommendations  Home health PT;Supervision - Intermittent     Does the patient have the potential to tolerate intense rehabilitation     Barriers to Discharge        Equipment Recommendations  None recommended by PT    Recommendations for Other Services    Frequency Min 3X/week   Progress towards PT Goals Progress towards PT goals: Progressing toward goals  Plan Current plan remains appropriate    Precautions / Restrictions Precautions Precautions: Knee;Fall   Pertinent Vitals/Pain Reports that R knee, was premedicated.  Is a 6    Mobility  Bed Mobility Bed Mobility: Not assessed Transfers Sit to Stand: 5: Supervision Stand to Sit: 5: Supervision Details for Transfer Assistance: <25% VC's on safety and hand placement with stand to sit Ambulation/Gait Ambulation/Gait Assistance: 4: Min guard Ambulation Distance (Feet): 400 Feet Assistive device: Rolling walker Ambulation/Gait Assistance Details: cues or sequence and safe turns, backing up into WC Gait Pattern: Step-to pattern;Decreased stride length;Antalgic Gait velocity: decr    Exercises     PT Diagnosis:    PT Problem List:   PT Treatment Interventions:     PT Goals (current goals can now be found in the care plan section)    Visit Information  Last PT Received On: 12/02/12 Assistance Needed: +1 History of Present Illness: R TKA in July; post op hematoma with  evacutation; wound dehiscence; pt returned home from SNF a few days ago and had a fall, returned to hospital to R/O infection    Subjective Data      Cognition  Cognition Arousal/Alertness: Awake/alert Behavior During Therapy: WFL for tasks assessed/performed Overall Cognitive Status: Within Functional Limits for tasks assessed    Balance     End of Session PT - End of Session Activity Tolerance: Patient tolerated treatment well Patient left: in chair;with call bell/phone within reach;with family/visitor present Nurse Communication: Mobility status   GP     Rada Hay 12/02/2012, 1:36 PM Blanchard Kelch PT [phone]

## 2012-12-02 NOTE — Progress Notes (Signed)
   Subjective: Right total knee wound dehiscence  Patient reports pain as mild.   Patient seen in rounds with Dr. Darrelyn Hillock. Patient is well, and has had no acute complaints or problems other than some discomfort in her right knee and frustration. No issues overnight. No SOB or chest pain.  Plan is to go Skilled nursing facility after hospital stay.  Objective: Vital signs in last 24 hours: Temp:  [99.2 F (37.3 C)-100 F (37.8 C)] 99.2 F (37.3 C) (09/07 0535) Pulse Rate:  [71-81] 81 (09/07 0535) Resp:  [16] 16 (09/07 0535) BP: (107-151)/(60-73) 107/64 mmHg (09/07 0535) SpO2:  [93 %-95 %] 93 % (09/07 0535)  Intake/Output from previous day:  Intake/Output Summary (Last 24 hours) at 12/02/12 0946 Last data filed at 12/02/12 0805  Gross per 24 hour  Intake    480 ml  Output   2625 ml  Net  -2145 ml    Intake/Output this shift: Total I/O In: -  Out: 400 [Urine:400]  Labs:  Recent Labs  11/30/12 0422 12/01/12 0523 12/02/12 0455  HGB 7.7* 10.1* 10.4*    Recent Labs  12/01/12 0523 12/02/12 0455  WBC 10.2 10.6*  RBC 3.67* 3.81*  HCT 31.0* 31.9*  PLT 375 401*    Recent Labs  12/01/12 0523 12/02/12 0455  NA 129* 126*  K 4.6 4.6  CL 92* 91*  CO2 31 26  BUN 9 10  CREATININE 0.77 0.80  GLUCOSE 102* 97  CALCIUM 8.5 8.4   No results found for this basename: LABPT, INR,  in the last 72 hours  EXAM General - Patient is Alert and Oriented Extremity - Neurologically intact Neurovascular intact Dorsiflexion/Plantar flexion intact Dressing/Incision - clean, dry, no drainage, open Motor Function - intact, moving foot and toes well on exam.   Past Medical History  Diagnosis Date  . Mixed hyperlipidemia   . Hypothyroidism   . GERD (gastroesophageal reflux disease)   . Atrial fibrillation     Remote amiodarone use, recurred in 07/2011 post op from knee arthroplasty  . Obese   . Asymptomatic cholelithiasis   . History of blood transfusion 08/31/2011    A+ type    . History of GI bleed     Gastritis, was on Xarelto and ASA  . Carpal tunnel syndrome   . Arthritis     Knees  . DJD (degenerative joint disease), thoracolumbar   . Rheumatoid arthritis   . H/O bronchitis   . Hemorrhoids   . Urinary incontinence   . Measles     as child  . Mumps     as child  . Carotid artery disease   . PONV (postoperative nausea and vomiting)     Assessment/Plan: Principal Problem:   Pain in right knee Active Problems:   Essential hypertension, benign   Atrial fibrillation   Hyponatremia   PVC's (premature ventricular contractions)   Surgical wound infection   Hypothyroidism Right total knee wound dehiscence   Estimated body mass index is 25.39 kg/(m^2) as calculated from the following:   Height as of this encounter: 5' 5.5" (1.664 m).   Weight as of this encounter: 70.308 kg (155 lb).  Weight-Bearing as tolerated   Continue wet to dry dressings. Stable for DC from ortho standpoint. Patient to be DC to SNF once medically stable.   Tayt Moyers LAUREN 12/02/2012, 9:46 AM

## 2012-12-03 LAB — URINALYSIS, ROUTINE W REFLEX MICROSCOPIC
Bilirubin Urine: NEGATIVE
Hgb urine dipstick: NEGATIVE
Protein, ur: NEGATIVE mg/dL
Urobilinogen, UA: 0.2 mg/dL (ref 0.0–1.0)

## 2012-12-03 LAB — CBC
MCH: 27.5 pg (ref 26.0–34.0)
MCV: 84.9 fL (ref 78.0–100.0)
Platelets: 393 10*3/uL (ref 150–400)
RDW: 16.6 % — ABNORMAL HIGH (ref 11.5–15.5)

## 2012-12-03 LAB — BASIC METABOLIC PANEL
Calcium: 9 mg/dL (ref 8.4–10.5)
Creatinine, Ser: 0.82 mg/dL (ref 0.50–1.10)
GFR calc Af Amer: 76 mL/min — ABNORMAL LOW (ref >90)
GFR calc non Af Amer: 66 mL/min — ABNORMAL LOW (ref >90)

## 2012-12-03 MED ORDER — SULFAMETHOXAZOLE-TMP DS 800-160 MG PO TABS: 1.0000 | ORAL_TABLET | Freq: Two times a day (BID) | ORAL | Status: DC

## 2012-12-03 MED ORDER — ALPRAZOLAM 0.5 MG PO TABS: 0.5000 mg | ORAL_TABLET | Freq: Three times a day (TID) | ORAL | Status: DC | PRN

## 2012-12-03 MED ORDER — CEPHALEXIN 500 MG PO CAPS: 500.0000 mg | ORAL_CAPSULE | Freq: Three times a day (TID) | ORAL | Status: DC

## 2012-12-03 NOTE — Progress Notes (Signed)
NUTRITION FOLLOW UP  Pt meets criteria for severe MALNUTRITION in the context of chronic illness as evidenced by <75% estimated energy intake with 14% weight loss in the past 2 months per pt report.  Intervention:   - Continue Ensure Complete daily - Encouraged high calorie/protein diet to promote wound healing - Will continue to monitor  Nutrition Dx:   Inadequate oral intake related to poor appetite as evidenced by 7% wt loss in less than one month - improving    Goal:   Pt to meet >/= 90% of their estimated nutrition needs - not met but improving    Assessment:   Met with pt who reports consuming 100% of Ensure Complete daily and eating around 50% of meals. No new weights since admission. Reports 25 pound unintended weight loss in the past 2 months from poor appetite, only eating bites of food at mealtimes PTA. Eager to go home today.   Height: Ht Readings from Last 1 Encounters:  11/27/12 5' 5.5" (1.664 m)    Weight Status:   Wt Readings from Last 1 Encounters:  11/27/12 155 lb (70.308 kg)    Re-estimated needs:  Kcal: 1550-1700 Protein: 75-85g Fluid: 2.1L/day  Skin: +1 RLE edema, right knee incision   Diet Order: General   Intake/Output Summary (Last 24 hours) at 12/03/12 0940 Last data filed at 12/03/12 0927  Gross per 24 hour  Intake   1040 ml  Output   1450 ml  Net   -410 ml    Last BM: 9/5   Labs:   Recent Labs Lab 12/01/12 0523 12/02/12 0455 12/03/12 0353  NA 129* 126* 132*  K 4.6 4.6 4.6  CL 92* 91* 95*  CO2 31 26 29   BUN 9 10 10   CREATININE 0.77 0.80 0.82  CALCIUM 8.5 8.4 9.0  GLUCOSE 102* 97 94    CBG (last 3)  No results found for this basename: GLUCAP,  in the last 72 hours  Scheduled Meds: . aspirin EC  325 mg Oral Daily  . cephALEXin  500 mg Oral Q6H  . docusate sodium  100 mg Oral BID  . esomeprazole  40 mg Oral Q1200  . feeding supplement  237 mL Oral Q24H  . ferrous sulfate  325 mg Oral BID  . gabapentin  300 mg Oral  BID  . levothyroxine  150 mcg Oral QAC breakfast  . lidocaine (PF)  5 mL Intradermal Once  . multivitamin with minerals  1 tablet Oral Daily  . oxybutynin  10 mg Oral Daily  . simvastatin  20 mg Oral QPC supper  . sulfamethoxazole-trimethoprim  1 tablet Oral Q12H     Levon Hedger MS, RD, [place] [phone] Pager [phone] After Hours Pager

## 2012-12-03 NOTE — Progress Notes (Signed)
12/03/2012 Colleen Can BSN RN CCM [phone] CM spoke with patient and son. Patient and son verified that patient would be returning to her home in [facility]) where her family members will provide care for her. She also will have services of [facility] for physicla therapy and wound care. [facility] rep notified of pt's continued HH needs. Orders have been entered by doctor for [facility] services. Pt to discharge today.

## 2012-12-03 NOTE — Discharge Summary (Signed)
Physician Discharge Summary  [name] [name] ZOX:[id number] DOB: 08-27-1930 DOA: 11/26/2012  PCP: Karlene Einstein, MD  Admit date: 11/26/2012 Discharge date: 12/03/2012  Recommendations for Outpatient Follow-up:  1. Pt will need to follow up with PCP in 2-3 weeks post discharge 2. Please obtain BMP to evaluate electrolytes and kidney function 3. Please also check CBC to evaluate Hg and Hct levels 4. Please note that pt was discharged on Keflex and Bactrim to complete the therapy for 7 more days post discharge, stop date 12/09/2012 5. Please also note that pt was told to stop taking Doxycycline   Discharge Diagnoses:  Principal Problem:   Pain in right knee Active Problems:   Essential hypertension, benign   Atrial fibrillation   Hyponatremia   PVC's (premature ventricular contractions)   Surgical wound infection   Hypothyroidism  Discharge Condition: Stable  Diet recommendation: Heart healthy diet discussed in details   Brief narrative:  79 y.o. year-old female with history of CAD, atrial fibrillation not on anticoagulation due to bleeding into knee and h/o GIB, hypothyroidism, arthritis, who underwent right total knee arthroplasty by Dr. Lequita Halt on 10/08/12. Her post-operative course was complicated by post-operative hematoma requiring I&D on 7/17. She was discharged on doxycycline to rehab and subsequently had wound dehiscence and has been treated with wet to dry dressing. She was discharged to home a few days prior to this admission, but developed increased weakness and had a fall the night prior to admission. She had lower back pain and pain in the right upper leg and knee post-fall. The home health nurse noticed her knee was appearing more erythematous and she was referred to the ER. She was seen at [facility] ER where it was noted her right knee was red, swollen and painful. WBC 20.7, hgb 9.3, plt 386, Na 120, K 3.6, chloride 88, bicarb 23, BUN 11, cr 0.76. XR lumbar spine: Severe  degenerative changes and scoliosis, no acute findings. XR right femur: No acute bony abnormality, moderate joint effusion of the right knee.   Principal Problem:  Pain in right knee  - appreciate ortho team assistance  - Right knee wound dehiscence, right knee aspiration done 9/3 with minimal fluid obtained after multiple passes  - wound repacked 9/4, continue ambulation as pt able to tolerate  - continue analgesia as needed  - pt wants to go home, [facility] PT with aid will be ordered  Active Problems:  Low grade fever  - unclear etiology, pt reports intermittent cough  - CXR clear and UA clear as well, pt afebrile over 24 hours  Leukocytosis  - secondary to an infection  - continued Vancomycin day #5  - Bactrim PO and Keflex day #4 and complete for 7 more days post discharge, stop date 12/09/2012  Essential hypertension, benign  - reasonable inpatient control  Atrial fibrillation  - rate controlled and in sinus rhythm, no anticoagulation candidate due to history of GI bleed  - continue aspirin  Hyponatremia  - secondary to pre renal etiology  - NS IVF provided and pt responded well, Na trending up 127 --> 131 --> 129 --> 126 --> 132 - pt has responded well to IVF  PVC's (premature ventricular contractions)  - clinically asymptomatic, continued to monitor on telemetry and no specific events noted  Surgical wound infection  - appreciate ortho assistance  - continue Vancomycin day #5, continue Bactrim and keflex day #4 and will continue regimen with Bactrim and Keflex until 12/09/2012  Hypothyroidism  - continue synthroid  Anemia of chronic disease and iron deficiency anemia, acute blood loss anemia  - slight drop in Hg but no signs of overt bleeding, possibly post op related but not clear at this time  - 2 units PRBC 11/30/2012 with appropriate increase in Hg  - repeat CBC in an outpatient setting  GERD  - continue PPI   Consultants:  Ortho Procedures/Studies:  9/3 --> right knee  aspiration  Antibiotics:  Vancomycin 9/1 --> 9/5  Bactrim 9/5 --> 9/14 Keflex 9/5 --> 9/14  Code Status: Full  Family Communication: Pt and son at bedside   Discharge Exam: Filed Vitals:   12/03/12 0500  BP: 114/93  Pulse: 74  Temp: 98 F (36.7 C)  Resp: 18   Filed Vitals:   12/02/12 0535 12/02/12 1449 12/02/12 2332 12/03/12 0500  BP: 107/64 144/67 158/73 114/93  Pulse: 81 64 73 74  Temp: 99.2 F (37.3 C) 98.2 F (36.8 C) 98.2 F (36.8 C) 98 F (36.7 C)  TempSrc: Oral Oral Oral Oral  Resp: 16 16 18 18   Height:      Weight:      SpO2: 93% 97% 96% 96%    General: Pt is alert, follows commands appropriately, not in acute distress Cardiovascular: Regular rate and rhythm, S1/S2 +, no murmurs, no rubs, no gallops Respiratory: Clear to auscultation bilaterally, no wheezing, no crackles, no rhonchi Abdominal: Soft, non tender, non distended, bowel sounds +, no guarding Extremities: no edema, no cyanosis, pulses palpable bilaterally DP and PT Neuro: Grossly nonfocal  Discharge Instructions  Discharge Orders   Future Orders Complete By Expires   Diet - low sodium heart healthy  As directed    Increase activity slowly  As directed        Medication List    STOP taking these medications       doxycycline 100 MG tablet  Commonly known as:  VIBRA-TABS      TAKE these medications       ALPRAZolam 0.5 MG tablet  Commonly known as:  XANAX  Take 0.5-1 mg by mouth 3 (three) times daily as needed for sleep or anxiety. Take 1 in morning and 2 every evening for anxiety     ALPRAZolam 0.5 MG tablet  Commonly known as:  XANAX  Take 1-2 tablets (0.5-1 mg total) by mouth 3 (three) times daily as needed for sleep or anxiety.     aspirin EC 81 MG tablet  Take 81 mg by mouth every morning.     bisacodyl 10 MG suppository  Commonly known as:  DULCOLAX  Place 1 suppository (10 mg total) rectally daily as needed.     cephALEXin 500 MG capsule  Commonly known as:  KEFLEX   Take 1 capsule (500 mg total) by mouth 3 (three) times daily.     cloNIDine 0.1 MG tablet  Commonly known as:  CATAPRES  Take 0.1 mg by mouth every 6 (six) hours. Systolic BP greater than 160     docusate sodium 100 MG capsule  Commonly known as:  COLACE  Take 100 mg by mouth 2 (two) times daily. 0900, 1700     esomeprazole 40 MG capsule  Commonly known as:  NEXIUM  Take 40 mg by mouth daily.     ferrous sulfate 325 (65 FE) MG tablet  Take 325 mg by mouth 2 (two) times daily.     gabapentin 300 MG capsule  Commonly known as:  NEURONTIN  Take 300 mg by mouth 2 (two)  times daily.     levothyroxine 150 MCG tablet  Commonly known as:  SYNTHROID, LEVOTHROID  Take 150 mcg by mouth daily before breakfast.     methocarbamol 500 MG tablet  Commonly known as:  ROBAXIN  Take 1 tablet (500 mg total) by mouth every 6 (six) hours as needed.     ondansetron 4 MG tablet  Commonly known as:  ZOFRAN  Take 1 tablet (4 mg total) by mouth every 6 (six) hours as needed for nausea.     oxybutynin 10 MG 24 hr tablet  Commonly known as:  DITROPAN-XL  Take 10 mg by mouth daily.     polyethylene glycol packet  Commonly known as:  MIRALAX / GLYCOLAX  Take 17 g by mouth 2 (two) times daily.     promethazine 25 MG tablet  Commonly known as:  PHENERGAN  Take 0.5-1 tablets (12.5-25 mg total) by mouth every 6 (six) hours as needed for nausea.     simvastatin 20 MG tablet  Commonly known as:  ZOCOR  Take 20 mg by mouth every morning.     sulfamethoxazole-trimethoprim 800-160 MG per tablet  Commonly known as:  BACTRIM DS  Take 1 tablet by mouth every 12 (twelve) hours.     traMADol 50 MG tablet  Commonly known as:  ULTRAM  Take 50 mg by mouth every 8 (eight) hours as needed for pain. 0600, 1400, 2200           Follow-up Information   Follow up with [facility], MD In 2 weeks.   Specialty:  Internal Medicine   Contact information:   [address] [place]  [address] [phone]        The results of significant diagnostics from this hospitalization (including imaging, microbiology, ancillary and laboratory) are listed below for reference.     Microbiology: Recent Results (from the past 240 hour(s))  URINE CULTURE     Status: None   Collection Time    11/27/12  5:18 AM      Result Value Range Status   Specimen Description URINE, CLEAN CATCH   Final   Special Requests NONE   Final   Culture  Setup Time     Final   Value: 11/27/2012 09:44     Performed at [facility] Count     Final   Value: 20,OOO COLONIES/ML     Performed at [facility]   Culture     Final   Value: Multiple bacterial morphotypes present, none predominant. Suggest appropriate recollection if clinically indicated.     Performed at [facility]   Report Status 11/28/2012 FINAL   Final  MRSA PCR SCREENING     Status: None   Collection Time    11/27/12  9:38 AM      Result Value Range Status   MRSA by PCR NEGATIVE  NEGATIVE Final   Comment:            The GeneXpert MRSA Assay (FDA     approved for NASAL specimens     only), is one component of a     comprehensive MRSA colonization     surveillance program. It is not     intended to diagnose MRSA     infection nor to guide or     monitor treatment for     MRSA infections.     Labs: Basic Metabolic Panel:  Recent Labs Lab 11/29/12 0516 11/30/12 0422 12/01/12  5784 12/02/12 0455 12/03/12 0353  NA 131* 131* 129* 126* 132*  K 3.9 4.0 4.6 4.6 4.6  CL 99 97 92* 91* 95*  CO2 26 30 31 26 29   GLUCOSE 107* 95 102* 97 94  BUN 4* 6 9 10 10   CREATININE 0.51 0.58 0.77 0.80 0.82  CALCIUM 8.5 8.6 8.5 8.4 9.0  CBC:  Recent Labs Lab 11/29/12 0516 11/30/12 0422 12/01/12 0523 12/02/12 0455 12/03/12 0353  WBC 10.6* 9.4 10.2 10.6* 7.5  HGB 8.5* 7.7* 10.1* 10.4* 10.6*  HCT 26.7* 24.0* 31.0* 31.9* 32.7*  MCV 85.3 85.7 84.5 83.7 84.9  PLT 419* 418* 375 401* 393     SIGNED: Time coordinating discharge: Over 30 minutes  [name], MD  [facility] 12/03/2012, 11:16 AM Pager [phone]  If 7PM-7AM, please contact night-coverage [email or web address] Password [email or web address]

## 2012-12-03 NOTE — Progress Notes (Signed)
Physical Therapy Treatment Patient Details Name: [name] [name] MRN: [id number] DOB: [birth year] Today's Date: 12/03/2012 Time: 2956-2130 PT Time Calculation (min): 14 min  PT Assessment / Plan / Recommendation  History of Present Illness R TKA in July; post op hematoma with evacutation; wound dehiscence; pt returned home from SNF a few days ago and had a fall, returned to hospital to R/O infection   PT Comments   Pt doing well this am, no c/o pain, incr AROM left knee--pt demo's I'ly  Follow Up Recommendations  Home health PT;Supervision - Intermittent     Does the patient have the potential to tolerate intense rehabilitation     Barriers to Discharge        Equipment Recommendations  None recommended by PT    Recommendations for Other Services    Frequency     Progress towards PT Goals    Plan Current plan remains appropriate    Precautions / Restrictions Precautions Precautions: Knee;Fall Restrictions Weight Bearing Restrictions: No   Pertinent Vitals/Pain     Mobility  Bed Mobility Bed Mobility: Not assessed Transfers Transfers: Sit to Stand;Stand to Sit Sit to Stand: 5: Supervision;6: Modified independent (Device/Increase time) Stand to Sit: 5: Supervision Details for Transfer Assistance: verbal cues for safety with stand to sit, hand placement and backing up to surface Ambulation/Gait Ambulation/Gait Assistance: 5: Supervision Ambulation Distance (Feet): 400 Feet Assistive device: Rolling walker Ambulation/Gait Assistance Details: cuesf or Rw distance form self occasionally Gait Pattern: Step-to pattern;Decreased stride length;Antalgic Gait velocity: decr    Exercises     PT Diagnosis:    PT Problem List:   PT Treatment Interventions:     PT Goals (current goals can now be found in the care plan section) Acute Rehab PT Goals Time For Goal Achievement: 12/12/12 Potential to Achieve Goals: Good  Visit Information  Last PT Received On:  12/03/12 Assistance Needed: +1 History of Present Illness: R TKA in July; post op hematoma with evacutation; wound dehiscence; pt returned home from SNF a few days ago and had a fall, returned to hospital to R/O infection    Subjective Data  Subjective: I never turn down therapy!   Cognition  Cognition Arousal/Alertness: Awake/alert Behavior During Therapy: WFL for tasks assessed/performed Overall Cognitive Status: Within Functional Limits for tasks assessed    Balance     End of Session PT - End of Session Activity Tolerance: Patient tolerated treatment well Patient left: in chair;with call bell/phone within reach;with nursing/sitter in room;with family/visitor present   GP     [facility] 12/03/2012, 12:00 PM

## 2012-12-03 NOTE — Progress Notes (Signed)
Pt for d/c home today with [facility] PT & RN per [facility]. Ace wrap intact to R knee. Dressing changed by Ortho MD this am. IV d/c'd. Dressing supplies given for home use--W-D dressing change BID per MD. Discharge instructions & RX given with verbalized understanding. Son at bedside to assist with d/c.

## 2012-12-03 NOTE — Progress Notes (Signed)
Subjective: Pt states she is feeling better   Objective: Vital signs in last 24 hours: Temp:  [98 F (36.7 C)-98.2 F (36.8 C)] 98 F (36.7 C) (09/08 0500) Pulse Rate:  [64-74] 74 (09/08 0500) Resp:  [16-18] 18 (09/08 0500) BP: (114-158)/(67-93) 114/93 mmHg (09/08 0500) SpO2:  [96 %-97 %] 96 % (09/08 0500)  Intake/Output from previous day: 09/07 0701 - 09/08 0700 In: 1040 [P.O.:360; I.V.:680] Out: 1550 [Urine:1550] Intake/Output this shift:     Recent Labs  12/01/12 0523 12/02/12 0455 12/03/12 0353  HGB 10.1* 10.4* 10.6*    Recent Labs  12/02/12 0455 12/03/12 0353  WBC 10.6* 7.5  RBC 3.81* 3.85*  HCT 31.9* 32.7*  PLT 401* 393    Recent Labs  12/02/12 0455 12/03/12 0353  NA 126* 132*  K 4.6 4.6  CL 91* 95*  CO2 26 29  BUN 10 10  CREATININE 0.80 0.82  GLUCOSE 97 94  CALCIUM 8.4 9.0   No results found for this basename: LABPT, INR,  in the last 72 hours  Neurologically intact Neurovascular intact No cellulitis present Wound looks better with more granulation at edges  Assessment/Plan: Right knee wound dehiscence- Doing better. Home today. Follow up next week.   Loanne Drilling 12/03/2012, 7:10 AM

## 2012-12-11 ENCOUNTER — Other Ambulatory Visit: Payer: Self-pay | Admitting: Orthopedic Surgery

## 2012-12-11 NOTE — Progress Notes (Signed)
PT ADDED ON FOR SURGERY TOMORROW.  SPOKE WITH PT BY PHONE - SHE IS VERY HARD OF HEARING AND NOT VERY MOBILE.  SHE WILL BRING HER MEDICATION BOTTLES TO HOSPITAL SO THAT AN ACCURATE LIST OF MEDS CAN BE ENTERED.  HER MEDICAL HX IS WELL DOCUMENTED IN EPIC - BUT UNABLE TO REVIEW WITH PT OVER THE PHONE BECAUSE OF HER HEARING PROBLEMS.  PREOP INSTRUCTIONS DISCUSSED WITH PT OVER THE PHONE--BUT I ALSO CALLED HER SON RANDY KEETON AND REVIEWED HER PREOP INSTRUCTIONS WITH HIM.  PT LIVES ALONE - DOES NOT HAVE ANY CHLORHEXIDINE SOAP - SHE WILL BATHE WITH DIAL OR DOVE - WHAT EVER SHE HAS AT HOME.

## 2012-12-12 ENCOUNTER — Inpatient Hospital Stay (HOSPITAL_COMMUNITY)
Admission: RE | Admit: 2012-12-12 | Discharge: 2012-12-14 | DRG: 902 | Disposition: A | Discharge status: Home Health | Payer: Medicare Other | Source: Ambulatory Visit | Attending: Orthopedic Surgery | Admitting: Orthopedic Surgery

## 2012-12-12 ENCOUNTER — Encounter (HOSPITAL_COMMUNITY): Payer: Self-pay | Admitting: *Deleted

## 2012-12-12 ENCOUNTER — Encounter (HOSPITAL_COMMUNITY)
Admission: RE | Disposition: A | Discharge status: Home Health | Payer: Self-pay | Source: Ambulatory Visit | Attending: Orthopedic Surgery

## 2012-12-12 ENCOUNTER — Encounter (HOSPITAL_COMMUNITY): Payer: Self-pay | Admitting: Anesthesiology

## 2012-12-12 ENCOUNTER — Ambulatory Visit (HOSPITAL_COMMUNITY): Payer: Medicare Other | Admitting: Anesthesiology

## 2012-12-12 DIAGNOSIS — T8132XA Disruption of internal operation (surgical) wound, not elsewhere classified, initial encounter: Principal | ICD-10-CM | POA: Diagnosis present

## 2012-12-12 DIAGNOSIS — T81329A Deep disruption or dehiscence of operation wound, unspecified, initial encounter: Principal | ICD-10-CM | POA: Diagnosis present

## 2012-12-12 DIAGNOSIS — T8189XA Other complications of procedures, not elsewhere classified, initial encounter: Secondary | ICD-10-CM | POA: Diagnosis present

## 2012-12-12 DIAGNOSIS — E782 Mixed hyperlipidemia: Secondary | ICD-10-CM | POA: Diagnosis present

## 2012-12-12 DIAGNOSIS — I4891 Unspecified atrial fibrillation: Secondary | ICD-10-CM | POA: Diagnosis present

## 2012-12-12 DIAGNOSIS — L988 Other specified disorders of the skin and subcutaneous tissue: Secondary | ICD-10-CM | POA: Diagnosis present

## 2012-12-12 DIAGNOSIS — E039 Hypothyroidism, unspecified: Secondary | ICD-10-CM | POA: Diagnosis present

## 2012-12-12 DIAGNOSIS — K219 Gastro-esophageal reflux disease without esophagitis: Secondary | ICD-10-CM | POA: Diagnosis present

## 2012-12-12 DIAGNOSIS — Y831 Surgical operation with implant of artificial internal device as the cause of abnormal reaction of the patient, or of later complication, without mention of misadventure at the time of the procedure: Secondary | ICD-10-CM | POA: Diagnosis present

## 2012-12-12 DIAGNOSIS — Z6825 Body mass index (BMI) 25.0-25.9, adult: Secondary | ICD-10-CM

## 2012-12-12 DIAGNOSIS — E669 Obesity, unspecified: Secondary | ICD-10-CM | POA: Diagnosis present

## 2012-12-12 DIAGNOSIS — Z96659 Presence of unspecified artificial knee joint: Secondary | ICD-10-CM

## 2012-12-12 DIAGNOSIS — Z880 Allergy status to penicillin: Secondary | ICD-10-CM

## 2012-12-12 DIAGNOSIS — I96 Gangrene, not elsewhere classified: Secondary | ICD-10-CM

## 2012-12-12 DIAGNOSIS — E871 Hypo-osmolality and hyponatremia: Secondary | ICD-10-CM | POA: Diagnosis present

## 2012-12-12 DIAGNOSIS — D62 Acute posthemorrhagic anemia: Secondary | ICD-10-CM | POA: Diagnosis not present

## 2012-12-12 DIAGNOSIS — M069 Rheumatoid arthritis, unspecified: Secondary | ICD-10-CM | POA: Diagnosis present

## 2012-12-12 HISTORY — PX: IRRIGATION AND DEBRIDEMENT KNEE: SHX5185

## 2012-12-12 LAB — SURGICAL PCR SCREEN
MRSA, PCR: NEGATIVE
Staphylococcus aureus: NEGATIVE

## 2012-12-12 SURGERY — IRRIGATION AND DEBRIDEMENT KNEE
Anesthesia: General | Site: Knee | Laterality: Right | Wound class: Clean Contaminated

## 2012-12-12 MED ORDER — VITAMINS A & D EX OINT
TOPICAL_OINTMENT | CUTANEOUS | Status: AC
  Filled 2012-12-12: qty 5

## 2012-12-12 MED ORDER — FENTANYL CITRATE 0.05 MG/ML IJ SOLN
INTRAMUSCULAR | Status: AC
  Filled 2012-12-12: qty 2

## 2012-12-12 MED ORDER — FENTANYL CITRATE 0.05 MG/ML IJ SOLN
25.0000 ug | INTRAMUSCULAR | Status: DC | PRN
  Administered 2012-12-12 (×2): 50 ug via INTRAVENOUS

## 2012-12-12 MED ORDER — ONDANSETRON HCL 4 MG PO TABS: 4.0000 mg | ORAL_TABLET | Freq: Four times a day (QID) | ORAL | Status: DC | PRN

## 2012-12-12 MED ORDER — FENTANYL CITRATE 0.05 MG/ML IJ SOLN
INTRAMUSCULAR | Status: DC | PRN
  Administered 2012-12-12 (×3): 50 ug via INTRAVENOUS

## 2012-12-12 MED ORDER — MUPIROCIN 2 % EX OINT
TOPICAL_OINTMENT | Freq: Two times a day (BID) | CUTANEOUS | Status: DC
  Filled 2012-12-12: qty 22

## 2012-12-12 MED ORDER — CEFAZOLIN SODIUM-DEXTROSE 2-3 GM-% IV SOLR
2.0000 g | INTRAVENOUS | Status: AC
  Administered 2012-12-12: 2 g via INTRAVENOUS

## 2012-12-12 MED ORDER — SODIUM CHLORIDE 0.9 % IV SOLN: INTRAVENOUS | Status: DC

## 2012-12-12 MED ORDER — ALPRAZOLAM 0.5 MG PO TABS
0.5000 mg | ORAL_TABLET | Freq: Two times a day (BID) | ORAL | Status: DC | PRN
  Administered 2012-12-12: 0.5 mg via ORAL
  Administered 2012-12-13: 1 mg via ORAL
  Filled 2012-12-12: qty 2
  Filled 2012-12-12: qty 1

## 2012-12-12 MED ORDER — METHOCARBAMOL 100 MG/ML IJ SOLN
500.0000 mg | Freq: Four times a day (QID) | INTRAMUSCULAR | Status: DC | PRN
  Administered 2012-12-12: 500 mg via INTRAVENOUS
  Filled 2012-12-12: qty 5

## 2012-12-12 MED ORDER — FERROUS SULFATE 325 (65 FE) MG PO TABS
325.0000 mg | ORAL_TABLET | Freq: Two times a day (BID) | ORAL | Status: DC
  Administered 2012-12-13 – 2012-12-14 (×3): 325 mg via ORAL
  Filled 2012-12-12 (×5): qty 1

## 2012-12-12 MED ORDER — HYDROMORPHONE HCL PF 1 MG/ML IJ SOLN
0.2500 mg | INTRAMUSCULAR | Status: DC | PRN
  Administered 2012-12-12 (×4): 0.5 mg via INTRAVENOUS

## 2012-12-12 MED ORDER — METOCLOPRAMIDE HCL 5 MG/ML IJ SOLN: 5.0000 mg | Freq: Three times a day (TID) | INTRAMUSCULAR | Status: DC | PRN

## 2012-12-12 MED ORDER — PROMETHAZINE HCL 25 MG PO TABS: 12.5000 mg | ORAL_TABLET | Freq: Four times a day (QID) | ORAL | Status: DC | PRN

## 2012-12-12 MED ORDER — TRAMADOL HCL 50 MG PO TABS
50.0000 mg | ORAL_TABLET | Freq: Four times a day (QID) | ORAL | Status: DC | PRN
  Administered 2012-12-14: 08:00:00 100 mg via ORAL
  Filled 2012-12-12: qty 2

## 2012-12-12 MED ORDER — METOCLOPRAMIDE HCL 10 MG PO TABS: 5.0000 mg | ORAL_TABLET | Freq: Three times a day (TID) | ORAL | Status: DC | PRN

## 2012-12-12 MED ORDER — DOCUSATE SODIUM 100 MG PO CAPS
100.0000 mg | ORAL_CAPSULE | Freq: Every morning | ORAL | Status: DC
  Administered 2012-12-13 – 2012-12-14 (×2): 100 mg via ORAL

## 2012-12-12 MED ORDER — HYDROCODONE-ACETAMINOPHEN 5-325 MG PO TABS
1.0000 | ORAL_TABLET | ORAL | Status: DC | PRN
  Administered 2012-12-12: 23:00:00 1 via ORAL
  Administered 2012-12-13 – 2012-12-14 (×4): 2 via ORAL
  Filled 2012-12-12: qty 2
  Filled 2012-12-12: qty 1
  Filled 2012-12-12 (×3): qty 2

## 2012-12-12 MED ORDER — LIDOCAINE HCL 1 % IJ SOLN
INTRAMUSCULAR | Status: DC | PRN
  Administered 2012-12-12: 60 mg via INTRADERMAL

## 2012-12-12 MED ORDER — GABAPENTIN 300 MG PO CAPS
300.0000 mg | ORAL_CAPSULE | Freq: Two times a day (BID) | ORAL | Status: DC
  Administered 2012-12-12 – 2012-12-14 (×4): 300 mg via ORAL
  Filled 2012-12-12 (×5): qty 1

## 2012-12-12 MED ORDER — LACTATED RINGERS IV SOLN
INTRAVENOUS | Status: DC
  Administered 2012-12-12: 1000 mL via INTRAVENOUS
  Administered 2012-12-12: 17:00:00 via INTRAVENOUS

## 2012-12-12 MED ORDER — HYDROMORPHONE HCL PF 1 MG/ML IJ SOLN
INTRAMUSCULAR | Status: AC
  Filled 2012-12-12: qty 1

## 2012-12-12 MED ORDER — CHLORHEXIDINE GLUCONATE 4 % EX LIQD
60.0000 mL | Freq: Once | CUTANEOUS | Status: DC
Start: 2012-12-12 — End: 2012-12-12
  Filled 2012-12-12: qty 60

## 2012-12-12 MED ORDER — ASPIRIN EC 81 MG PO TBEC
81.0000 mg | DELAYED_RELEASE_TABLET | Freq: Every morning | ORAL | Status: DC
  Administered 2012-12-13 – 2012-12-14 (×2): 81 mg via ORAL
  Filled 2012-12-12 (×2): qty 1

## 2012-12-12 MED ORDER — ONDANSETRON HCL 4 MG/2ML IJ SOLN: 4.0000 mg | Freq: Four times a day (QID) | INTRAMUSCULAR | Status: DC | PRN

## 2012-12-12 MED ORDER — LEVOTHYROXINE SODIUM 150 MCG PO TABS
150.0000 ug | ORAL_TABLET | Freq: Every day | ORAL | Status: DC
  Administered 2012-12-13 – 2012-12-14 (×2): 150 ug via ORAL
  Filled 2012-12-12 (×3): qty 1

## 2012-12-12 MED ORDER — ONDANSETRON HCL 4 MG/2ML IJ SOLN
INTRAMUSCULAR | Status: DC | PRN
  Administered 2012-12-12: 4 mg via INTRAVENOUS

## 2012-12-12 MED ORDER — SIMVASTATIN 20 MG PO TABS
20.0000 mg | ORAL_TABLET | Freq: Every morning | ORAL | Status: DC
  Administered 2012-12-13 – 2012-12-14 (×2): 20 mg via ORAL
  Filled 2012-12-12 (×2): qty 1

## 2012-12-12 MED ORDER — VANCOMYCIN HCL IN DEXTROSE 1-5 GM/200ML-% IV SOLN
1000.0000 mg | Freq: Two times a day (BID) | INTRAVENOUS | Status: AC
  Administered 2012-12-12 – 2012-12-14 (×4): 1000 mg via INTRAVENOUS
  Filled 2012-12-12 (×4): qty 200

## 2012-12-12 MED ORDER — SODIUM CHLORIDE 0.9 % IV SOLN
INTRAVENOUS | Status: DC
  Administered 2012-12-13 (×2): via INTRAVENOUS

## 2012-12-12 MED ORDER — 0.9 % SODIUM CHLORIDE (POUR BTL) OPTIME
TOPICAL | Status: DC | PRN
  Administered 2012-12-12: 2000 mL

## 2012-12-12 MED ORDER — PROPOFOL 10 MG/ML IV BOLUS
INTRAVENOUS | Status: DC | PRN
  Administered 2012-12-12: 40 mg via INTRAVENOUS
  Administered 2012-12-12: 100 mg via INTRAVENOUS

## 2012-12-12 MED ORDER — DEXAMETHASONE SODIUM PHOSPHATE 10 MG/ML IJ SOLN
INTRAMUSCULAR | Status: DC | PRN
  Administered 2012-12-12: 10 mg via INTRAVENOUS

## 2012-12-12 MED ORDER — ACETAMINOPHEN 10 MG/ML IV SOLN
1000.0000 mg | Freq: Once | INTRAVENOUS | Status: AC
  Administered 2012-12-12: 1000 mg via INTRAVENOUS
  Filled 2012-12-12: qty 100

## 2012-12-12 MED ORDER — METHOCARBAMOL 500 MG PO TABS
500.0000 mg | ORAL_TABLET | Freq: Four times a day (QID) | ORAL | Status: DC | PRN
  Administered 2012-12-13 – 2012-12-14 (×3): 500 mg via ORAL
  Filled 2012-12-12 (×3): qty 1

## 2012-12-12 MED ORDER — LACTATED RINGERS IV SOLN: INTRAVENOUS | Status: DC

## 2012-12-12 MED ORDER — CEFAZOLIN SODIUM-DEXTROSE 2-3 GM-% IV SOLR
INTRAVENOUS | Status: AC
  Filled 2012-12-12: qty 50

## 2012-12-12 MED ORDER — NON FORMULARY: 40.0000 mg | Freq: Every day | Status: DC

## 2012-12-12 MED ORDER — OXYBUTYNIN CHLORIDE ER 10 MG PO TB24
10.0000 mg | ORAL_TABLET | Freq: Every day | ORAL | Status: DC
  Administered 2012-12-12 – 2012-12-14 (×3): 10 mg via ORAL
  Filled 2012-12-12 (×3): qty 1

## 2012-12-12 MED ORDER — HYDROMORPHONE HCL PF 1 MG/ML IJ SOLN
0.5000 mg | INTRAMUSCULAR | Status: DC | PRN
  Administered 2012-12-12: 1 mg via INTRAVENOUS
  Administered 2012-12-13: 0.5 mg via INTRAVENOUS
  Administered 2012-12-13: 1 mg via INTRAVENOUS
  Filled 2012-12-12 (×3): qty 1

## 2012-12-12 MED ORDER — DEXAMETHASONE SODIUM PHOSPHATE 10 MG/ML IJ SOLN: 10.0000 mg | Freq: Once | INTRAMUSCULAR | Status: DC

## 2012-12-12 MED ORDER — ESOMEPRAZOLE MAGNESIUM 40 MG PO CPDR
40.0000 mg | DELAYED_RELEASE_CAPSULE | Freq: Every day | ORAL | Status: DC
  Administered 2012-12-13 – 2012-12-14 (×2): 40 mg via ORAL
  Filled 2012-12-12 (×2): qty 1

## 2012-12-12 SURGICAL SUPPLY — 43 items
BAG SPEC THK2 15X12 ZIP CLS (MISCELLANEOUS) ×1
BAG ZIPLOCK 12X15 (MISCELLANEOUS) ×2 IMPLANT
BANDAGE ELASTIC 6 VELCRO ST LF (GAUZE/BANDAGES/DRESSINGS) ×2 IMPLANT
BANDAGE ESMARK 6X9 LF (GAUZE/BANDAGES/DRESSINGS) ×1 IMPLANT
BNDG CMPR 9X6 STRL LF SNTH (GAUZE/BANDAGES/DRESSINGS) ×1
BNDG ESMARK 6X9 LF (GAUZE/BANDAGES/DRESSINGS) ×2
CLOTH BEACON ORANGE TIMEOUT ST (SAFETY) ×2 IMPLANT
CONT SPECI 4OZ STER CLIK (MISCELLANEOUS) ×1 IMPLANT
CUFF TOURN SGL QUICK 34 (TOURNIQUET CUFF) ×2
CUFF TRNQT CYL 34X4X40X1 (TOURNIQUET CUFF) ×1 IMPLANT
DRAPE EXTREMITY T 121X128X90 (DRAPE) ×2 IMPLANT
DRAPE POUCH INSTRU U-SHP 10X18 (DRAPES) ×1 IMPLANT
DRSG ADAPTIC 3X8 NADH LF (GAUZE/BANDAGES/DRESSINGS) ×2 IMPLANT
DRSG PAD ABDOMINAL 8X10 ST (GAUZE/BANDAGES/DRESSINGS) ×2 IMPLANT
DRSG VAC ATS MED SENSATRAC (GAUZE/BANDAGES/DRESSINGS) ×1 IMPLANT
DURAPREP 26ML APPLICATOR (WOUND CARE) ×2 IMPLANT
ELECT REM PT RETURN 9FT ADLT (ELECTROSURGICAL) ×2
ELECTRODE REM PT RTRN 9FT ADLT (ELECTROSURGICAL) ×1 IMPLANT
EVACUATOR 1/8 PVC DRAIN (DRAIN) ×1 IMPLANT
GLOVE BIOGEL PI IND STRL 7.5 (GLOVE) ×1 IMPLANT
GLOVE BIOGEL PI IND STRL 8 (GLOVE) ×2 IMPLANT
GLOVE BIOGEL PI INDICATOR 7.5 (GLOVE) ×1
GLOVE BIOGEL PI INDICATOR 8 (GLOVE) ×2
GLOVE ECLIPSE 8.0 STRL XLNG CF (GLOVE) IMPLANT
GLOVE ORTHO TXT STRL SZ7.5 (GLOVE) ×4 IMPLANT
GLOVE SURG ORTHO 8.0 STRL STRW (GLOVE) ×2 IMPLANT
GOWN BRE IMP PREV XXLGXLNG (GOWN DISPOSABLE) ×4 IMPLANT
GOWN PREVENTION PLUS LG XLONG (DISPOSABLE) ×1 IMPLANT
HANDPIECE INTERPULSE COAX TIP (DISPOSABLE) ×2
KIT BASIN OR (CUSTOM PROCEDURE TRAY) ×2 IMPLANT
MANIFOLD NEPTUNE II (INSTRUMENTS) ×2 IMPLANT
PACK TOTAL JOINT (CUSTOM PROCEDURE TRAY) ×2 IMPLANT
PADDING CAST COTTON 6X4 STRL (CAST SUPPLIES) ×2 IMPLANT
POSITIONER SURGICAL ARM (MISCELLANEOUS) ×2 IMPLANT
SET HNDPC FAN SPRY TIP SCT (DISPOSABLE) ×1 IMPLANT
SPONGE GAUZE 4X4 12PLY (GAUZE/BANDAGES/DRESSINGS) ×2 IMPLANT
SUT ETHILON 4 0 PS 2 18 (SUTURE) ×3 IMPLANT
SUT PDS AB 1 CT1 27 (SUTURE) ×1 IMPLANT
SUT VIC AB 2-0 CT1 27 (SUTURE) ×4
SUT VIC AB 2-0 CT1 TAPERPNT 27 (SUTURE) IMPLANT
SWAB COLLECTION DEVICE MRSA (MISCELLANEOUS) ×1 IMPLANT
TOWEL OR 17X26 10 PK STRL BLUE (TOWEL DISPOSABLE) ×4 IMPLANT
TUBE ANAEROBIC SPECIMEN COL (MISCELLANEOUS) ×1 IMPLANT

## 2012-12-12 NOTE — Transfer of Care (Signed)
Immediate Anesthesia Transfer of Care Note  Patient: [name] [name]  Procedure(s) Performed: Procedure(s): RIGHT KNEE IRRIGATION AND DEBRIDEMENT, APPLICATION OF WOUND VAC (Right)  Patient Location: PACU  Anesthesia Type:General  Level of Consciousness: awake, alert  and oriented  Airway & Oxygen Therapy: Patient Spontanous Breathing and Patient connected to face mask oxygen  Post-op Assessment: Report given to PACU RN and Post -op Vital signs reviewed and stable  Post vital signs: Reviewed and stable  Complications: No apparent anesthesia complications

## 2012-12-12 NOTE — Progress Notes (Signed)
I spoke with Seabron Spates RN Infection Prevention nurse regarding pt's contact isolation status.  Pt did have a negative MRSA PCR screening within the last 30 days and based on the MD's note this am she has not and is not currently being treated for any infection.  Pt does not show signs of infection.  A PCR screening will be done prior to surgery today per protocol. Contact Isolation order cancelled from verbal order Seabron Spates RN Infection Prevention nurse.

## 2012-12-12 NOTE — Brief Op Note (Signed)
12/12/2012  5:12 PM  PATIENT:  [name] [name]  79 y.o. female  PRE-OPERATIVE DIAGNOSIS:  RIGHT KNEE WOUND NORCROSIS  POST-OPERATIVE DIAGNOSIS:  right knee wound necrosis  PROCEDURE:  Procedure(s): RIGHT KNEE IRRIGATION AND DEBRIDEMENT, APPLICATION OF WOUND VAC (Right)  SURGEON:  Surgeon(s) and Role:    * Loanne Drilling, MD - Primary  PHYSICIAN ASSISTANT:   ASSISTANTS: Avel Peace, PA-C   ANESTHESIA:   general  EBL:  Total I/O In: 1000 [I.V.:1000] Out: -   DRAINS: Wound VAC   LOCAL MEDICATIONS USED:  NONE  COUNTS:  YES  TOURNIQUET:   Total Tourniquet Time Documented: Thigh (Right) - 31 minutes Total: Thigh (Right) - 31 minutes   DICTATION: .Other Dictation: Dictation Number [id number]  PLAN OF CARE: Admit to inpatient   PATIENT DISPOSITION:  PACU - hemodynamically stable.

## 2012-12-12 NOTE — H&P (Signed)
[name] [name] is an 79 y.o. female.   Chief Complaint: Right knee pain HPI: [name] [name] is an 79 yo female who has had difficulties related to her right knee. She had a right TKA in July 14 and had an Irrigation and hematoma evacuation 3 das post-op due to reaction to her anticoagulant. She had a recent hospital admission for superficial wound breakdown and was discharged home last week with home health dressing changes. I saw her in the office yesterday and the open area has expanded. She did not have any drainage or signs of infection but did have exposed patellar tendon. She is being taken to the operating room today for wound debridement and possible VAC placement.  Past Medical History  Diagnosis Date  . Mixed hyperlipidemia   . Hypothyroidism   . GERD (gastroesophageal reflux disease)   . Atrial fibrillation     Remote amiodarone use, recurred in 07/2011 post op from knee arthroplasty  . Obese   . Asymptomatic cholelithiasis   . History of blood transfusion 08/31/2011    A+ type  . History of GI bleed     Gastritis, was on Xarelto and ASA  . Carpal tunnel syndrome   . Arthritis     Knees  . DJD (degenerative joint disease), thoracolumbar   . Rheumatoid arthritis   . H/O bronchitis   . Hemorrhoids   . Urinary incontinence   . Measles     as child  . Mumps     as child  . Carotid artery disease   . PONV (postoperative nausea and vomiting)     Past Surgical History  Procedure Laterality Date  . Tonsillectomy    . Cholecystectomy  04/29/2005    Dr. Luretha Murphy  . Thoracotomy  4/07    Removal bronchogenic cyst  . Total knee arthroplasty  08/10/2011    Procedure: TOTAL KNEE ARTHROPLASTY;  Surgeon: Loanne Drilling, MD;  Location: [facility];  Service: Orthopedics;  Laterality: Left;  . Rotator cuff repair  1992, 1993    Bilateral  . Tubal ligation      Bilateral  . Ventral hernia repair  05/25/2005    Incarcerated, lysis of adhesions, Dr. Luretha Murphy  . Appendectomy      as  child  . Bronchogenic cyst  2007  . Total knee arthroplasty Right 10/08/2012    Procedure: RIGHT TOTAL KNEE ARTHROPLASTY;  Surgeon: Loanne Drilling, MD;  Location: [facility];  Service: Orthopedics;  Laterality: Right;  . Irrigation and debridement knee Right 10/11/2012    Procedure: IRRIGATION AND DEBRIDEMENT KNEE;  Surgeon: Loanne Drilling, MD;  Location: [facility];  Service: Orthopedics;  Laterality: Right;    Family History  Problem Relation Age of Onset  . Cancer    . Stroke    . Diabetes    . Hypertension    . Colon cancer Neg Hx   . Colon polyps Neg Hx   . Rectal cancer Neg Hx   . Stomach cancer Neg Hx    Social History:  reports that she has never smoked. She has never used smokeless tobacco. She reports that she does not drink alcohol or use illicit drugs.  Allergies:  Allergies  Allergen Reactions  . Omeprazole Nausea And Vomiting  . Penicillins Itching  . Morphine Rash    And hot flashes  . Xarelto [Rivaroxaban] Other (See Comments)    Bleeding,spitting up blood    No prescriptions prior to admission    No  results found for this or any previous visit (from the past 48 hour(s)). No results found.  Review of Systems  Constitutional: Negative.   HENT: Negative.   Eyes: Negative.   Respiratory: Negative.   Cardiovascular: Negative.   Gastrointestinal: Negative.   Genitourinary: Negative.   Musculoskeletal: Positive for joint pain.  Skin:       Open area over right knee. No erythema.  Neurological: Negative.   Endo/Heme/Allergies: Bruises/bleeds easily.  Psychiatric/Behavioral: Negative.     There were no vitals taken for this visit. Physical Exam  Physical Examination: General appearance - alert, well appearing, and in no distress Mental status - alert, oriented to person, place, and time Neck - supple, no significant adenopathy Chest - clear to auscultation, no wheezes, rales or rhonchi, symmetric air entry Heart - normal rate, regular rhythm, normal S1, S2,  no murmurs, rubs, clicks or gallops Abdomen - soft, nontender, nondistended, no masses or organomegaly Neurological - alert, oriented, normal speech, no focal findings or movement disorder noted Right knee with 2 x 2 cm open area over patella tendon. No drainage or surrounding erythema. No effusion   Assessment/Plan Right knee wound breakdown- Plan superficial wound debridement and possible VAC placement. Discussed in detail with patient who elects to proceed.  Loanne Drilling 12/12/2012, 7:58 AM

## 2012-12-12 NOTE — Interval H&P Note (Signed)
History and Physical Interval Note:  12/12/2012 3:54 PM  [name] [name]  has presented today for surgery, with the diagnosis of RIGHT KNEE WOUND NORCROSIS  The various methods of treatment have been discussed with the patient and family. After consideration of risks, benefits and other options for treatment, the patient has consented to  Procedure(s): RIGHT KNEE IRRIGATION AND DEBRIDEMENT, VAC (Right) as a surgical intervention .  The patient's history has been reviewed, patient examined, no change in status, stable for surgery.  I have reviewed the patient's chart and labs.  Questions were answered to the patient's satisfaction.     Loanne Drilling

## 2012-12-12 NOTE — Anesthesia Preprocedure Evaluation (Addendum)
Anesthesia Evaluation  Patient identified by MRN, date of birth, ID band Patient awake    Reviewed: Allergy & Precautions, H&P , NPO status , Patient's Chart, lab work & pertinent test results  History of Anesthesia Complications (+) PONV  Airway Mallampati: II TM Distance: >3 FB Neck ROM: full    Dental no notable dental hx. (+) Teeth Intact and Dental Advisory Given   Pulmonary neg pulmonary ROS,  breath sounds clear to auscultation  Pulmonary exam normal       Cardiovascular Exercise Tolerance: Good hypertension, Pt. on medications negative cardio ROS  + dysrhythmias Atrial Fibrillation Rhythm:regular Rate:Normal  PVCs.  ECG NSR 11/27/12   Neuro/Psych Carotid artery disease negative neurological ROS  negative psych ROS   GI/Hepatic negative GI ROS, Neg liver ROS, GERD-  Medicated and Controlled,  Endo/Other  negative endocrine ROSHypothyroidism   Renal/GU negative Renal ROS  negative genitourinary   Musculoskeletal  (+) Arthritis -, Rheumatoid disorders,    Abdominal   Peds  Hematology negative hematology ROS (+) hgb 10.6   Anesthesia Other Findings   Reproductive/Obstetrics negative OB ROS                          Anesthesia Physical Anesthesia Plan  ASA: III  Anesthesia Plan: General   Post-op Pain Management:    Induction: Intravenous  Airway Management Planned: LMA  Additional Equipment:   Intra-op Plan:   Post-operative Plan:   Informed Consent: I have reviewed the patients History and Physical, chart, labs and discussed the procedure including the risks, benefits and alternatives for the proposed anesthesia with the patient or authorized representative who has indicated his/her understanding and acceptance.   Dental Advisory Given  Plan Discussed with: CRNA and Surgeon  Anesthesia Plan Comments:         Anesthesia Quick Evaluation

## 2012-12-12 NOTE — Anesthesia Postprocedure Evaluation (Signed)
  Anesthesia Post-op Note  Patient: [name] [name]  Procedure(s) Performed: Procedure(s) (LRB): RIGHT KNEE IRRIGATION AND DEBRIDEMENT, APPLICATION OF WOUND VAC (Right)  Patient Location: PACU  Anesthesia Type: General  Level of Consciousness: awake and alert   Airway and Oxygen Therapy: Patient Spontanous Breathing  Post-op Pain: mild  Post-op Assessment: Post-op Vital signs reviewed, Patient's Cardiovascular Status Stable, Respiratory Function Stable, Patent Airway and No signs of Nausea or vomiting  Last Vitals:  Filed Vitals:   12/12/12 1350  BP: 177/80  Pulse: 86  Temp: 36.7 C  Resp: 16    Post-op Vital Signs: stable   Complications: No apparent anesthesia complications

## 2012-12-12 NOTE — Preoperative (Signed)
Beta Blockers   Reason not to administer Beta Blockers:Not Applicable 

## 2012-12-12 NOTE — Progress Notes (Signed)
[facility]  Patient Status: Active (receiving services up to time of hospitalization)  [facility] is providing the following services: RN and PT  If patient discharges after hours, please call [phone].   Lanae Crumbly 12/12/2012, 1:33 PM

## 2012-12-13 LAB — CBC
HCT: 28.9 % — ABNORMAL LOW (ref 36.0–46.0)
MCHC: 32.2 g/dL (ref 30.0–36.0)
Platelets: 352 10*3/uL (ref 150–400)
RDW: 16.3 % — ABNORMAL HIGH (ref 11.5–15.5)

## 2012-12-13 LAB — BASIC METABOLIC PANEL
BUN: 9 mg/dL (ref 6–23)
Calcium: 8.9 mg/dL (ref 8.4–10.5)
Chloride: 93 mEq/L — ABNORMAL LOW (ref 96–112)
Creatinine, Ser: 0.58 mg/dL (ref 0.50–1.10)
GFR calc Af Amer: >90 mL/min (ref >90)
GFR calc non Af Amer: 85 mL/min — ABNORMAL LOW (ref >90)

## 2012-12-13 NOTE — Evaluation (Signed)
Physical Therapy Evaluation Patient Details Name: [name] [name] MRN: [id number] DOB: 1930/04/19 Today's Date: 12/13/2012 Time: 6213-0865 PT Time Calculation (min): 31 min  PT Assessment / Plan / Recommendation History of Present Illness  [name] [name] is an 79 yo female who has had difficulties related to her right knee. She had a right TKA in July 14 and had an Irrigation and hematoma evacuation 3 das post-op due to reaction to her anticoagulant. She had a recent hospital admission for superficial wound breakdown and was discharged home last week with home health dressing changes. I saw her in the office yesterday and the open area has expanded. She did not have any drainage or signs of infection but did have exposed patellar tendon. She is being taken to the operating room today for wound debridement and possible VAC placement.  Clinical Impression  Pt will benefit from PT to maximize independence and safety for home setting  PT Assessment  Patient needs continued PT services    Follow Up Recommendations  Home health PT;Supervision - Intermittent    Does the patient have the potential to tolerate intense rehabilitation      Barriers to Discharge        Equipment Recommendations  Rolling walker with 5" wheels    Recommendations for Other Services     Frequency Min 6X/week    Precautions / Restrictions Precautions Precautions: Knee;Fall;Other (comment) Precaution Comments: NO ROM RIGHTKNEE Restrictions Weight Bearing Restrictions: No Other Position/Activity Restrictions: WBAT   Pertinent Vitals/Pain  No c/o; pt states she is used to the pain     Mobility  Bed Mobility Bed Mobility: Not assessed Details for Bed Mobility Assistance: pt OOB in recliner Transfers Transfers: Sit to Stand;Stand to Sit Sit to Stand: 4: Min guard;From chair/3-in-1 Stand to Sit: 4: Min guard;To chair/3-in-1 Details for Transfer Assistance: verbal cues for safety with stand to sit, hand placement and  backing up to surface Ambulation/Gait Ambulation/Gait Assistance: 5: Supervision Ambulation Distance (Feet): 350 Feet Assistive device: Rolling walker Ambulation/Gait Assistance Details: cues for sequencing Gait Pattern: Step-to pattern;Decreased stride length;Antalgic    Exercises     PT Diagnosis: Difficulty walking  PT Problem List: Decreased strength;Decreased range of motion;Decreased mobility;Decreased knowledge of use of DME PT Treatment Interventions: DME instruction;Gait training;Functional mobility training;Therapeutic activities;Therapeutic exercise;Patient/family education     PT Goals(Current goals can be found in the care plan section) Acute Rehab PT Goals Patient Stated Goal: go home and knee get better PT Goal Formulation: With patient Time For Goal Achievement: 12/20/12 Potential to Achieve Goals: Good  Visit Information  Last PT Received On: 12/13/12 Assistance Needed: +1 History of Present Illness: [name] [name] is an 79 yo female who has had difficulties related to her right knee. She had a right TKA in July 14 and had an Irrigation and hematoma evacuation 3 das post-op due to reaction to her anticoagulant. She had a recent hospital admission for superficial wound breakdown and was discharged home last week with home health dressing changes. I saw her in the office yesterday and the open area has expanded. She did not have any drainage or signs of infection but did have exposed patellar tendon. She is being taken to the operating room today for wound debridement and possible VAC placement.       Prior Functioning  Home Living Family/patient expects to be discharged to:: Private residence Living Arrangements: Alone Available Help at Discharge: Family Type of Home: House Home Access: Level entry Home Layout: Able to live  on main level with bedroom/bathroom Home Equipment: Walker - 2 wheels;Bedside commode;Cane - single point;Grab bars - tub/shower Additional  Comments: sister staying with her 4-6hrs per day; Her RW is broken Prior Function Level of Independence: Independent with assistive device(s) Communication Communication: No difficulties    Cognition  Cognition Arousal/Alertness: Awake/alert Behavior During Therapy: WFL for tasks assessed/performed Overall Cognitive Status: Within Functional Limits for tasks assessed    Extremity/Trunk Assessment Upper Extremity Assessment Upper Extremity Assessment: Overall WFL for tasks assessed Lower Extremity Assessment Lower Extremity Assessment: RLE deficits/detail RLE Deficits / Details: knee not tested, due to precautions; ankle WFL LLE Deficits / Details: grossly WFL for activities tested   Balance    End of Session PT - End of Session Activity Tolerance: Patient tolerated treatment well Patient left: in chair;with call bell/phone within reach Nurse Communication: Mobility status  GP     [facility] 12/13/2012, 11:25 AM

## 2012-12-13 NOTE — Op Note (Signed)
[name] [name], [name] [name]                 ACCOUNT NO.:  [id number]  MEDICAL RECORD NO.:  [id number]  LOCATION:  1602                         FACILITY:  [facility]  PHYSICIAN:  Ollen Gross, M.D.    DATE OF BIRTH:  Jan 08, 1931  DATE OF PROCEDURE:  12/12/2012 DATE OF DISCHARGE:                              OPERATIVE REPORT   PREOPERATIVE DIAGNOSIS:  Right knee wound necrosis.  POSTOPERATIVE DIAGNOSIS:  Right knee wound necrosis.  PROCEDURE:  Irrigation and debridement of right knee wound and application of VAC.  SURGEON:  Ollen Gross, M.D.  ASSISTANT:  Alexzandrew L. Perkins, P.A.C.  ANESTHESIA:  General.  ESTIMATED BLOOD LOSS:  Minimal.  DRAINS:  Wound VAC x1.  COMPLICATIONS:  None.  CONDITION:  Stable to recovery.  BRIEF CLINICAL NOTE:  [name] [name] is an 79 year old female with complex history in regards to her right knee.  She had a total knee done in July of this year and then had a reaction to the anticoagulant and had excess bleeding and hematoma requiring irrigation and debridement.  She had a small open area treated with dressing changes and had a fall about 2 weeks ago in that area increased in size.  After discharge from the hospital, it had improved, but upon visiting the office yesterday, the area appeared to be enlarging.  Given that it was an open area overlying the patella tendon, I felt it was essential to do debridement and attempt to either close this or replace it wound VAC.  She is here today for debridement and possible VAC placement.  PROCEDURE IN DETAIL:  After successful administration of general anesthetic, a tourniquet was placed on the right thigh.  Right lower extremity, prepped and draped in the usual sterile fashion.  Extremity was wrapped in Esmarch, and tourniquet inflated to 300 mmHg.  A knife was used to excise the previous scar starting at the superior aspect of the scar and cutting about 3 mm on each edge of where the scar was.  She had an area  that was superficially open at the mid wound, and it was only about 5 mm opening that was excised also with a knife.  This was just excise down to the skin and subcu tissue.  The length of the incision is about 6 inches.  Open area overlying the patellar tendon is about 1 x 1 cm.  The wound edges are ellipsed out with a knife around this area.  We then created large subcutaneous flaps.  A small amount of subcutaneous tissue had to be excisionally debrided with the knife.  The remainder of the tissue had a very healthy appearance.  I thoroughly irrigated the subcu tissues.  Fortunately, the joint was completely closed, and there was no communication with the joint and the subcu tissues.  I used a fresh knife to make a tiny incision in the joint, and there was a very small amount of bloody fluid.  There was no sign of any infection in the joint.  I thoroughly irrigated the joint with a liter of saline and then closed that area with a #1 PDS suture.  We then closed the subcutaneous tissue with 2-0 Vicryl.  It was about a 1 cm area that has about a 5 mm opening where I did not want to place excess tension.  This was the area that previously was overlying the patella tendon.  There was no exposed tendon.  The rest of the tissue easily closed with essentially no tension.  The skin was closed with a running 4-0 nylon at the superior aspect of the incision above the patella.  We then used the interrupted 4-0 nylon for the rest of the incision.  The open area was 1 cm x 5 mm.  We then thoroughly cleaned and dried the incision and placed the Adaptic over the closed portion of this.  The sponge for the wound VAC was then placed in the open area, and incision wound VAC also placed with the sponge being passed over the Adaptic. The dressing and a shield were placed.  We hooked it up to the suction, and the seal was excellent.  The tourniquet was released for total time of 30 minutes.  The patient was  subsequently awakened and transported to recovery in stable condition.     Ollen Gross, M.D.     FA/MEDQ  D:  12/12/2012  T:  12/13/2012  Job:  [id number]

## 2012-12-13 NOTE — Plan of Care (Signed)
Problem: Diagnosis - Type of Surgery Goal: General Surgical Patient Education (See Patient Education module for education specifics) Outcome: Completed/Met Date Met:  12/13/12 I&D of Right Knee

## 2012-12-13 NOTE — Care Management Note (Addendum)
    Page 1 of 2   12/14/2012     4:57:55 PM   CARE MANAGEMENT NOTE 12/14/2012  Patient:  [name] [name], [name] [name]   Account Number:  [id number]  Date Initiated:  12/13/2012  Documentation initiated by:  Colleen Can  Subjective/Objective Assessment:   dx rt knee wound necrosis; I&D with appilcation of wound vac.    Orders for negative presure wound therapy for home. Wants [facility] per PA-Perkins     Action/Plan:   CM spoke with patient. Plans to go home with care provided by family members. Is active with [facility].   Anticipated DC Date:  12/14/2012   Anticipated DC Plan:  HOME W HOME HEALTH SERVICES      DC Planning Services  CM consult      PAC Choice  DURABLE MEDICAL EQUIPMENT  HOME HEALTH   Choice offered to / List presented to:  C-1 Patient   DME arranged  NEGATIVE PRESSURE WOUND DEVICE      DME agency  [facility]     HH arranged  HH-1 RN      [facility] agency  [facility]   Status of service:  Completed, signed off Medicare Important Message given?   (If response is "NO", the following Medicare IM given date fields will be blank) Date Medicare IM given:   Date Additional Medicare IM given:    Discharge Disposition:  HOME W HOME HEALTH SERVICES  Per UR Regulation:  Reviewed for med. necessity/level of care/duration of stay  If discussed at Long Length of Stay Meetings, dates discussed:    Comments:  12/14/2012 Colleen Can BSN RN CCM [phone] [facility] forms for wouund vac faxed to [phone]. Wound vac was approved and released per telephone call from [place] at [facility] customer service. To be delivered to hospital by courier. [facility] to provide HHrn with start of services tomorrow 12/15/2012.  12/13/2012 Colleen Can BSN RN CCM [phone] Wound vac application placed in front of shadow chart.  MD or PA please complete as much as possible and I will fill in the blanks. When application is completed I will fax to [facility].

## 2012-12-13 NOTE — Progress Notes (Signed)
Subjective: 1 Day Post-Op Procedure(s) (LRB): RIGHT KNEE IRRIGATION AND DEBRIDEMENT, APPLICATION OF WOUND VAC (Right) Patient reports pain as mild.   Patient seen in rounds with Dr. Lequita Halt.  Did have a tough night.  The staff had to adjust the VAC dressing last night.  Holding suction at this time.  Will plan to change the [facility] tomorrow and then allow home. Patient is well, but has had some minor complaints of pain in the knee, requiring pain medications We will start therapy today.  Plan is to go Home after hospital stay.  Objective: Vital signs in last 24 hours: Temp:  [97.4 F (36.3 C)-98.3 F (36.8 C)] 97.7 F (36.5 C) (09/18 0618) Pulse Rate:  [72-86] 72 (09/18 0618) Resp:  [16] 16 (09/18 0618) BP: (128-186)/(65-84) 161/69 mmHg (09/18 0618) SpO2:  [100 %] 100 % (09/18 0618) Weight:  [70.308 kg (155 lb)] 70.308 kg (155 lb) (09/17 1350)  Intake/Output from previous day: 09/17 0701 - 09/18 0700 In: 2898.3 [P.O.:240; I.V.:2458.3; IV Piggyback:200] Out: 1375 [Urine:1250; Drains:125] Intake/Output this shift:     Recent Labs  12/13/12 0507  HGB 9.3*    Recent Labs  12/13/12 0507  WBC 6.8  RBC 3.46*  HCT 28.9*  PLT 352    Recent Labs  12/13/12 0507  NA 128*  K 4.7  CL 93*  CO2 26  BUN 9  CREATININE 0.58  GLUCOSE 129*  CALCIUM 8.9   No results found for this basename: LABPT, INR,  in the last 72 hours  EXAM General - Patient is Alert, Appropriate and Oriented Extremity - Neurovascular intact Sensation intact distally Dressing - dressing C/D/I, VAC in place. Motor Function - intact, moving foot and toes well on exam.   Past Medical History  Diagnosis Date  . Mixed hyperlipidemia   . Hypothyroidism   . GERD (gastroesophageal reflux disease)   . Atrial fibrillation     Remote amiodarone use, recurred in 07/2011 post op from knee arthroplasty  . Obese   . Asymptomatic cholelithiasis   . History of blood transfusion 08/31/2011    A+ type  . History of  GI bleed     Gastritis, was on Xarelto and ASA  . Carpal tunnel syndrome   . Arthritis     Knees  . DJD (degenerative joint disease), thoracolumbar   . Rheumatoid arthritis   . H/O bronchitis   . Hemorrhoids   . Urinary incontinence   . Measles     as child  . Mumps     as child  . Carotid artery disease   . PONV (postoperative nausea and vomiting)     Assessment/Plan: 1 Day Post-Op Procedure(s) (LRB): RIGHT KNEE IRRIGATION AND DEBRIDEMENT, APPLICATION OF WOUND VAC (Right) Principal Problem:   Necrosis of surgical wound Active Problems:   Hyponatremia  Estimated body mass index is 25.79 kg/(m^2) as calculated from the following:   Height as of this encounter: 5\' 5"  (1.651 m).   Weight as of this encounter: 70.308 kg (155 lb). Advance diet Up with therapy but absolutely no bending of the knee. Will work with therapy. Will change VAC dressing tomorrow and then home.  VAC nursing to assist at home. Follow up with Dr. Lequita Halt next Friday 12/21/2012 in the office and have the patient bring the [facility] supplies to the office for Dr. Lequita Halt to change and inspect the wound.  DVT Prophylaxis - Aspirin Weight-Bearing as tolerated to right leg but no bending of the right knee No vaccines. D/C  O2 and Pulse OX and try on Room [address]  Patrica Duel 12/13/2012, 9:24 AM

## 2012-12-14 ENCOUNTER — Encounter (HOSPITAL_COMMUNITY): Payer: Self-pay | Admitting: Orthopedic Surgery

## 2012-12-14 LAB — CBC
Hemoglobin: 8.8 g/dL — ABNORMAL LOW (ref 12.0–15.0)
MCHC: 31.5 g/dL (ref 30.0–36.0)
RDW: 16.5 % — ABNORMAL HIGH (ref 11.5–15.5)
WBC: 6.6 10*3/uL (ref 4.0–10.5)

## 2012-12-14 LAB — BASIC METABOLIC PANEL
BUN: 12 mg/dL (ref 6–23)
Creatinine, Ser: 0.7 mg/dL (ref 0.50–1.10)
GFR calc Af Amer: >90 mL/min (ref >90)
GFR calc non Af Amer: 80 mL/min — ABNORMAL LOW (ref >90)
Potassium: 4.1 mEq/L (ref 3.5–5.1)

## 2012-12-14 MED ORDER — TRAMADOL HCL 50 MG PO TABS: 50.0000 mg | ORAL_TABLET | Freq: Four times a day (QID) | ORAL | Status: DC | PRN

## 2012-12-14 MED ORDER — LEVOTHYROXINE SODIUM 150 MCG PO TABS: 150.0000 ug | ORAL_TABLET | Freq: Every day | ORAL | Status: DC

## 2012-12-14 MED ORDER — HYDROCODONE-ACETAMINOPHEN 5-325 MG PO TABS: 1.0000 | ORAL_TABLET | ORAL | Status: DC | PRN

## 2012-12-14 MED ORDER — METHOCARBAMOL 500 MG PO TABS: 500.0000 mg | ORAL_TABLET | Freq: Four times a day (QID) | ORAL | Status: DC | PRN

## 2012-12-14 MED ORDER — POLYETHYLENE GLYCOL 3350 17 G PO PACK
17.0000 g | PACK | Freq: Every day | ORAL | Status: DC
  Administered 2012-12-14: 09:00:00 17 g via ORAL

## 2012-12-14 NOTE — Discharge Summary (Signed)
Physician Discharge Summary   Patient ID: MICALA SALTSMAN MRN: [id number] DOB/AGE: Mar 17, 1931 79 y.o.  Admit date: 12/12/2012 Discharge date: 12/14/2012  Primary Diagnosis: Right knee wound necrosis.  Admission Diagnoses:  Past Medical History  Diagnosis Date  . Mixed hyperlipidemia   . Hypothyroidism   . GERD (gastroesophageal reflux disease)   . Atrial fibrillation     Remote amiodarone use, recurred in 07/2011 post op from knee arthroplasty  . Obese   . Asymptomatic cholelithiasis   . History of blood transfusion 08/31/2011    A+ type  . History of GI bleed     Gastritis, was on Xarelto and ASA  . Carpal tunnel syndrome   . Arthritis     Knees  . DJD (degenerative joint disease), thoracolumbar   . Rheumatoid arthritis   . H/O bronchitis   . Hemorrhoids   . Urinary incontinence   . Measles     as child  . Mumps     as child  . Carotid artery disease   . PONV (postoperative nausea and vomiting)    Discharge Diagnoses:   Principal Problem:   Necrosis of surgical wound Active Problems:   Hyponatremia   Postoperative anemia due to acute blood loss  Estimated body mass index is 25.79 kg/(m^2) as calculated from the following:   Height as of this encounter: 5\' 5"  (1.651 m).   Weight as of this encounter: 70.308 kg (155 lb).  Procedure:  Procedure(s) (LRB): RIGHT KNEE IRRIGATION AND DEBRIDEMENT, APPLICATION OF WOUND VAC (Right)   Consults: None  HPI: Ms. Pennino is an 79 year old female with complex  history in regards to her right knee. She had a total knee done in July  of this year and then had a reaction to the anticoagulant and had excess  bleeding and hematoma requiring irrigation and debridement. She had a  small open area treated with dressing changes and had a fall about 2  weeks ago in that area increased in size. After discharge from the  hospital, it had improved, but upon visiting the office yesterday, the  area appeared to be enlarging. Given that it was  an open area overlying  the patella tendon, I felt it was essential to do debridement and  attempt to either close this or replace it wound VAC. She is here today  for debridement and possible VAC placement.  Laboratory Data: Admission on 12/12/2012, Discharged on 12/14/2012  Component Date Value Range Status  . MRSA, PCR 12/12/2012 NEGATIVE  NEGATIVE Final  . Staphylococcus aureus 12/12/2012 NEGATIVE  NEGATIVE Final   Comment:                                 The Xpert SA Assay (FDA                          approved for NASAL specimens                          in patients over 10 years of age),                          is one component of                          a comprehensive surveillance  program.  Test performance has                          been validated by [facility] for patients greater                          than or equal to 31 year old.                          It is not intended                          to diagnose infection nor to                          guide or monitor treatment.  . Sodium 12/13/2012 128* 135 - 145 mEq/L Final  . Potassium 12/13/2012 4.7  3.5 - 5.1 mEq/L Final  . Chloride 12/13/2012 93* 96 - 112 mEq/L Final  . CO2 12/13/2012 26  19 - 32 mEq/L Final  . Glucose, Bld 12/13/2012 129* 70 - 99 mg/dL Final  . BUN 11/91/4782 9  6 - 23 mg/dL Final  . Creatinine, Ser 12/13/2012 0.58  0.50 - 1.10 mg/dL Final  . Calcium 95/62/1308 8.9  8.4 - 10.5 mg/dL Final  . GFR calc non Af Amer 12/13/2012 85* >90 mL/min Final  . GFR calc Af Amer 12/13/2012 >90  >90 mL/min Final   Comment: (NOTE)                          The eGFR has been calculated using the CKD EPI equation.                          This calculation has not been validated in all clinical situations.                          eGFR's persistently <90 mL/min signify possible Chronic Kidney                          Disease.  . WBC 12/13/2012 6.8  4.0 -  10.5 K/uL Final  . RBC 12/13/2012 3.46* 3.87 - 5.11 MIL/uL Final  . Hemoglobin 12/13/2012 9.3* 12.0 - 15.0 g/dL Final  . HCT 65/78/4696 28.9* 36.0 - 46.0 % Final  . MCV 12/13/2012 83.5  78.0 - 100.0 fL Final  . MCH 12/13/2012 26.9  26.0 - 34.0 pg Final  . MCHC 12/13/2012 32.2  30.0 - 36.0 g/dL Final  . RDW 29/52/8413 16.3* 11.5 - 15.5 % Final  . Platelets 12/13/2012 352  150 - 400 K/uL Final  . Sodium 12/14/2012 135  135 - 145 mEq/L Final   Comment: DELTA CHECK NOTED                          REPEATED TO VERIFY  . Potassium 12/14/2012 4.1  3.5 - 5.1 mEq/L Final  . Chloride 12/14/2012 100  96 - 112 mEq/L Final  . CO2 12/14/2012 27  19 - 32 mEq/L  Final  . Glucose, Bld 12/14/2012 88  70 - 99 mg/dL Final  . BUN 40/98/1191 12  6 - 23 mg/dL Final  . Creatinine, Ser 12/14/2012 0.70  0.50 - 1.10 mg/dL Final  . Calcium 47/82/9562 8.8  8.4 - 10.5 mg/dL Final  . GFR calc non Af Amer 12/14/2012 80* >90 mL/min Final  . GFR calc Af Amer 12/14/2012 >90  >90 mL/min Final   Comment: (NOTE)                          The eGFR has been calculated using the CKD EPI equation.                          This calculation has not been validated in all clinical situations.                          eGFR's persistently <90 mL/min signify possible Chronic Kidney                          Disease.  . WBC 12/14/2012 6.6  4.0 - 10.5 K/uL Final  . RBC 12/14/2012 3.28* 3.87 - 5.11 MIL/uL Final  . Hemoglobin 12/14/2012 8.8* 12.0 - 15.0 g/dL Final  . HCT 13/10/6576 27.9* 36.0 - 46.0 % Final  . MCV 12/14/2012 85.1  78.0 - 100.0 fL Final  . MCH 12/14/2012 26.8  26.0 - 34.0 pg Final  . MCHC 12/14/2012 31.5  30.0 - 36.0 g/dL Final  . RDW 46/96/2952 16.5* 11.5 - 15.5 % Final  . Platelets 12/14/2012 366  150 - 400 K/uL Final  Admission on 11/26/2012, Discharged on 12/03/2012  No results displayed because visit has over 200 results.       X-Rays:Dg Chest 2 View  12/02/2012   *RADIOLOGY REPORT*  Clinical Data: Fever, history  of atrial fibrillation  CHEST - 2 VIEW  Comparison: 10/11/2012; 08/30/2012; 08/03/2011; chest CT - 08/12/2011  Findings:  Grossly unchanged enlarged cardiac silhouette and mediastinal contours with persistent tortuosity of the thoracic aorta.  There is persistent elevation of the left hemidiaphragm.  Mild pulmonary congestion without frank evidence of edema.  No pleural effusion or pneumothorax.  Unchanged bones including scoliotic curvature of the thoracolumbar spine. Degenerative change of the right glenohumeral joint.  Post cholecystectomy.  IMPRESSION: 1.  Cardiomegaly and pulmonary venous congestion without frank evidence of edema. 2.  Scoliotic curvature of the thoracolumbar spine with associated mild apparent elevation of the left hemidiaphragm.   Original Report Authenticated By: Tacey Ruiz, MD    EKG: Orders placed during the hospital encounter of 11/26/12  . EKG 12-LEAD  . EKG 12-LEAD  . EKG     Hospital Course: AKYAH LAGRANGE is a 79 y.o. who was admitted to [facility]. They were brought to the operating room on 12/12/2012 and underwent Procedure(s): RIGHT KNEE IRRIGATION AND DEBRIDEMENT, APPLICATION OF WOUND VAC.  Patient tolerated the procedure well and was later transferred to the recovery room and then to the orthopaedic floor for postoperative care.  They were given PO and IV analgesics for pain control following their surgery.  They were given 24 hours of postoperative antibiotics of  Anti-infectives   Start     Dose/Rate Route Frequency Ordered Stop   12/13/12 0600  ceFAZolin (ANCEF) IVPB 2 g/50 mL premix     2  g 100 mL/hr over 30 Minutes Intravenous On call to O.R. 12/12/12 1349 12/12/12 1615   12/12/12 2000  vancomycin (VANCOCIN) IVPB 1000 mg/200 mL premix     1,000 mg 200 mL/hr over 60 Minutes Intravenous Every 12 hours 12/12/12 1909 12/14/12 0800     and started on DVT prophylaxis in the form of Aspirin.   PT and OT were ordered for total joint protocol.   Discharge planning consulted to help with postop disposition and equipment needs.  Patient seen in rounds with Dr. Lequita Halt. Did have a tough night. The staff had to adjust the [facility] dressing the night of surgery. Holding suction at that time. Planned to change the [facility] on day two and then allow home.  They started to get up OOB with therapy on day one. Vac Dressing was changed on day two and the wound looked clean, small wound looked good, no necrotic tissue noted, no purulence noted.  Patient was seen in rounds and after the [facility] was changed, the patient was ready to go home.  HHRN and VAC dressings were arranged.   Discharge Medications: Prior to Admission medications   Medication Sig Start Date End Date Taking? Authorizing Provider  ALPRAZolam Prudy Feeler) 0.5 MG tablet Take 0.5-1 mg by mouth 2 (two) times daily as needed for anxiety. Take one tablet in the am and two tablets in the pm   Yes Historical Provider, MD  aspirin EC 81 MG tablet Take 81 mg by mouth every morning.   Yes Historical Provider, MD  docusate sodium (COLACE) 100 MG capsule Take 100 mg by mouth every morning. 0900, 1700   Yes Historical Provider, MD  esomeprazole (NEXIUM) 40 MG capsule Take 40 mg by mouth daily. 06/26/12  Yes Horald Pollen, PA-C  ferrous sulfate 325 (65 FE) MG tablet Take 325 mg by mouth 2 (two) times daily.    Yes Historical Provider, MD  gabapentin (NEURONTIN) 300 MG capsule Take 300 mg by mouth 2 (two) times daily. 06/26/12  Yes Horald Pollen, PA-C  ondansetron (ZOFRAN) 4 MG tablet Take 1 tablet (4 mg total) by mouth every 6 (six) hours as needed for nausea. 10/12/12  Yes Dillion Stowers Julien Girt, PA-C  oxybutynin (DITROPAN-XL) 10 MG 24 hr tablet Take 10 mg by mouth daily.   Yes Historical Provider, MD  promethazine (PHENERGAN) 25 MG tablet Take 0.5-1 tablets (12.5-25 mg total) by mouth every 6 (six) hours as needed for nausea. 10/12/12  Yes Favian Kittleson Julien Girt, PA-C  simvastatin (ZOCOR) 20 MG tablet Take 20 mg by mouth every  morning. 06/26/12  Yes Horald Pollen, PA-C  HYDROcodone-acetaminophen (NORCO/VICODIN) 5-325 MG per tablet Take 1-2 tablets by mouth every 4 (four) hours as needed (moderate to severe pain). 12/14/12   Damone Fancher Julien Girt, PA-C  levothyroxine (SYNTHROID, LEVOTHROID) 150 MCG tablet Take 1 tablet (150 mcg total) by mouth daily before breakfast. 12/14/12   Dravon Nott Julien Girt, PA-C  methocarbamol (ROBAXIN) 500 MG tablet Take 1 tablet (500 mg total) by mouth every 6 (six) hours as needed. 12/14/12   Trenell Moxey, PA-C  traMADol (ULTRAM) 50 MG tablet Take 1 tablet (50 mg total) by mouth every 6 (six) hours as needed for pain (mild to moderate pain). 12/14/12   Fumio Vandam Julien Girt, PA-C    Discharge home with home health  Diet - Cardiac diet  Follow up - next Friday 12/21/2012  Activity - WBAT, no motion or bending to the knee except for walking.  Disposition - Home  Condition Upon Discharge - Stable  D/C Meds - See DC Summary  DVT Prophylaxis - Aspirin       Discharge Orders   Future Orders Complete By Expires   Call MD / Call 911  As directed    Comments:     If you experience chest pain or shortness of breath, CALL 911 and be transported to the hospital emergency room.  If you develope a fever above 101 F, pus (white drainage) or increased drainage or redness at the wound, or calf pain, call your surgeon's office.   Change dressing  As directed    Comments:     Change dressing daily with sterile 4 x 4 inch gauze dressing and apply TED hose. Do not submerge the incision under water.   Constipation Prevention  As directed    Comments:     Drink plenty of fluids.  Prune juice may be helpful.  You may use a stool softener, such as Colace (over the counter) 100 mg twice a day.  Use MiraLax (over the counter) for constipation as needed.   Diet - low sodium heart healthy  As directed    Discharge instructions  As directed    Comments:     Pick up stool softner and laxative for home. Do not  submerge incision under water. May shower. Continue to use ice for pain and swelling from surgery.  Resume the home aspirin at home  [facility] dressing to the right knee. HHRN VAC change on either Monday 9/22 or Tuesday 9/23.   Patient to follow up in office next Friday 9/26 with Dr. Lequita Halt.  Patient is to bring VAC dressings to the office on Friday for Dr. Lequita Halt to change the dressing and examine the wound.  Patient may be weight bearing as tolerated tot he right leg but no active bending with therapy while VAC dressing in place.   Do not put a pillow under the knee. Place it under the heel.  As directed    Do not sit on low chairs, stoools or toilet seats, as it may be difficult to get up from low surfaces  As directed    Driving restrictions  As directed    Comments:     No driving until released by the physician.   Increase activity slowly as tolerated  As directed    Lifting restrictions  As directed    Comments:     No lifting until released by the physician.   Patient may shower  As directed    Comments:     You may shower without a dressing once there is no drainage.  Do not wash over the wound.  If drainage remains, do not shower until drainage stops.   TED hose  As directed    Comments:     Use stockings (TED hose) for 3 weeks on both leg(s).  You may remove them at night for sleeping.   Weight bearing as tolerated  As directed        Medication List         ALPRAZolam 0.5 MG tablet  Commonly known as:  XANAX  Take 0.5-1 mg by mouth 2 (two) times daily as needed for anxiety. Take one tablet in the am and two tablets in the pm     aspirin EC 81 MG tablet  Take 81 mg by mouth every morning.     docusate sodium 100 MG capsule  Commonly known as:  COLACE  Take 100 mg by mouth every morning. 0900,  1700     esomeprazole 40 MG capsule  Commonly known as:  NEXIUM  Take 40 mg by mouth daily.     ferrous sulfate 325 (65 FE) MG tablet  Take 325 mg by mouth 2 (two) times  daily.     gabapentin 300 MG capsule  Commonly known as:  NEURONTIN  Take 300 mg by mouth 2 (two) times daily.     HYDROcodone-acetaminophen 5-325 MG per tablet  Commonly known as:  NORCO/VICODIN  Take 1-2 tablets by mouth every 4 (four) hours as needed (moderate to severe pain).     levothyroxine 150 MCG tablet  Commonly known as:  SYNTHROID, LEVOTHROID  Take 1 tablet (150 mcg total) by mouth daily before breakfast.     methocarbamol 500 MG tablet  Commonly known as:  ROBAXIN  Take 1 tablet (500 mg total) by mouth every 6 (six) hours as needed.     ondansetron 4 MG tablet  Commonly known as:  ZOFRAN  Take 1 tablet (4 mg total) by mouth every 6 (six) hours as needed for nausea.     oxybutynin 10 MG 24 hr tablet  Commonly known as:  DITROPAN-XL  Take 10 mg by mouth daily.     promethazine 25 MG tablet  Commonly known as:  PHENERGAN  Take 0.5-1 tablets (12.5-25 mg total) by mouth every 6 (six) hours as needed for nausea.     simvastatin 20 MG tablet  Commonly known as:  ZOCOR  Take 20 mg by mouth every morning.     traMADol 50 MG tablet  Commonly known as:  ULTRAM  Take 1 tablet (50 mg total) by mouth every 6 (six) hours as needed for pain (mild to moderate pain).       Follow-up Information   Follow up with Loanne Drilling, MD. Schedule an appointment as soon as possible for a visit on 12/21/2012. (Will need to bring [facility] dressing supplies to the office visit for Dr. Lequita Halt to change the [facility] dressing on the patient.  Call the office for appointment time for Friday visit.)    Specialty:  Orthopedic Surgery   Contact information:   [address] [place] [address] [phone]       Signed: Patrica Duel 12/27/2012, 9:29 AM

## 2012-12-14 NOTE — Progress Notes (Signed)
Physical Therapy Treatment Patient Details Name: [name] [name] MRN: [id number] DOB: Dec 18, 1930 Today's Date: 12/14/2012 Time: 4098-1191 PT Time Calculation (min): 36 min  PT Assessment / Plan / Recommendation  History of Present Illness [name] [name] is an 79 yo female who has had difficulties related to her right knee. She had a right TKA in July 14 and had an Irrigation and hematoma evacuation 3 das post-op due to reaction to her anticoagulant. She had a recent hospital admission for superficial wound breakdown and was discharged home last week with home health dressing changes. I saw her in the office yesterday and the open area has expanded. She did not have any drainage or signs of infection but did have exposed patellar tendon. She is being taken to the operating room today for wound debridement and possible VAC placement.   PT Comments   Pt very knowledgeable about her current medical condition.  Pt aware of "no knee bending" and why.  WBAT.  Eager to D/c to home.  Assisted pt OOB to [facility] to void then amb in hallway. Positioned in recliner.    Follow Up Recommendations  Home health PT     Does the patient have the potential to tolerate intense rehabilitation     Barriers to Discharge        Equipment Recommendations  Rolling walker with 5" wheels    Recommendations for Other Services    Frequency Min 6X/week   Progress towards PT Goals Progress towards PT goals: Progressing toward goals  Plan      Precautions / Restrictions Precautions Precaution Comments: NO ROM RIGHTKNEE to ensure wound VAC seal stays intact Restrictions Weight Bearing Restrictions: No Other Position/Activity Restrictions: WBAT    Pertinent Vitals/Pain C/o "burning" of her R knee    Mobility  Bed Mobility Bed Mobility: Supine to Sit Supine to Sit: 4: Min guard Details for Bed Mobility Assistance: pt able to perform SLR and keep knee extended as she get OOB.  Transfers Transfers: Sit to Stand;Stand to  Sit Sit to Stand: 5: Supervision;4: Min guard;From bed;From toilet Stand to Sit: 5: Supervision;4: Min guard;To toilet;To chair/3-in-1 Details for Transfer Assistance: good use of hands and safety cognition Ambulation/Gait Ambulation/Gait Assistance: 5: Supervision;4: Min guard Ambulation Distance (Feet): 145 Feet Assistive device: Rolling walker Ambulation/Gait Assistance Details: Good safety cognition and safety awareness Gait Pattern: Step-to pattern;Decreased stride length;Antalgic Gait velocity: WFL     PT Goals (current goals can now be found in the care plan section)    Visit Information  Last PT Received On: 12/14/12 Assistance Needed: +1 History of Present Illness: [name] [name] is an 79 yo female who has had difficulties related to her right knee. She had a right TKA in July 14 and had an Irrigation and hematoma evacuation 3 das post-op due to reaction to her anticoagulant. She had a recent hospital admission for superficial wound breakdown and was discharged home last week with home health dressing changes. I saw her in the office yesterday and the open area has expanded. She did not have any drainage or signs of infection but did have exposed patellar tendon. She is being taken to the operating room today for wound debridement and possible VAC placement.    Subjective Data      Cognition       Balance     End of Session PT - End of Session Equipment Utilized During Treatment: Gait belt Activity Tolerance: Patient tolerated treatment well Patient left: in chair;with call bell/phone  within reach   Felecia Shelling  PTA [facility]  Acute  Rehab Pager      [phone]

## 2012-12-14 NOTE — Progress Notes (Signed)
   Subjective: 2 Days Post-Op Procedure(s) (LRB): RIGHT KNEE IRRIGATION AND DEBRIDEMENT, APPLICATION OF WOUND VAC (Right) Patient reports pain as mild.   Patient seen in rounds for Dr. Lequita Halt. Patient is well, and has had no acute complaints or problems Patient is ready to go home  Objective: Vital signs in last 24 hours: Temp:  [97.6 F (36.4 C)-99 F (37.2 C)] 99 F (37.2 C) (09/19 0445) Pulse Rate:  [64-78] 78 (09/19 0445) Resp:  [16-18] 16 (09/19 0445) BP: (144-172)/(65-74) 172/74 mmHg (09/19 0445) SpO2:  [97 %-100 %] 98 % (09/19 0445)  Intake/Output from previous day:  Intake/Output Summary (Last 24 hours) at 12/14/12 0834 Last data filed at 12/14/12 0805  Gross per 24 hour  Intake 1828.84 ml  Output   3400 ml  Net -1571.16 ml    Intake/Output this shift: Total I/O In: 261.7 [P.O.:240; I.V.:21.7] Out: -   Labs:  Recent Labs  12/13/12 0507 12/14/12 0420  HGB 9.3* 8.8*    Recent Labs  12/13/12 0507 12/14/12 0420  WBC 6.8 6.6  RBC 3.46* 3.28*  HCT 28.9* 27.9*  PLT 352 366    Recent Labs  12/13/12 0507 12/14/12 0420  NA 128* 135  K 4.7 4.1  CL 93* 100  CO2 26 27  BUN 9 12  CREATININE 0.58 0.70  GLUCOSE 129* 88  CALCIUM 8.9 8.8   No results found for this basename: LABPT, INR,  in the last 72 hours  EXAM: General - Patient is Alert and Appropriate Extremity - Neurovascular intact Sensation intact distally No cellulitis present Incision - clean, small wound looks good, no necrotic tissue noted, no purulence. VAC dressing changed today. Motor Function - intact, moving foot and toes well on exam.   Assessment/Plan: 2 Days Post-Op Procedure(s) (LRB): RIGHT KNEE IRRIGATION AND DEBRIDEMENT, APPLICATION OF WOUND VAC (Right) Procedure(s) (LRB): RIGHT KNEE IRRIGATION AND DEBRIDEMENT, APPLICATION OF WOUND VAC (Right) Past Medical History  Diagnosis Date  . Mixed hyperlipidemia   . Hypothyroidism   . GERD (gastroesophageal reflux disease)     . Atrial fibrillation     Remote amiodarone use, recurred in 07/2011 post op from knee arthroplasty  . Obese   . Asymptomatic cholelithiasis   . History of blood transfusion 08/31/2011    A+ type  . History of GI bleed     Gastritis, was on Xarelto and ASA  . Carpal tunnel syndrome   . Arthritis     Knees  . DJD (degenerative joint disease), thoracolumbar   . Rheumatoid arthritis   . H/O bronchitis   . Hemorrhoids   . Urinary incontinence   . Measles     as child  . Mumps     as child  . Carotid artery disease   . PONV (postoperative nausea and vomiting)    Principal Problem:   Necrosis of surgical wound Active Problems:   Hyponatremia  Estimated body mass index is 25.79 kg/(m^2) as calculated from the following:   Height as of this encounter: 5\' 5"  (1.651 m).   Weight as of this encounter: 70.308 kg (155 lb). Up with therapy Discharge home with home health Diet - Cardiac diet Follow up - next Friday 12/21/2012 Activity - WBAT, no motion or bending to the knee except for walking. Disposition - Home Condition Upon Discharge - Stable D/C Meds - See DC Summary DVT Prophylaxis - Aspirin  Alfie Alderfer 12/14/2012, 8:34 AM

## 2012-12-24 ENCOUNTER — Other Ambulatory Visit: Payer: Self-pay | Admitting: *Deleted

## 2013-01-16 ENCOUNTER — Other Ambulatory Visit: Payer: Self-pay | Admitting: *Deleted

## 2013-01-16 MED ORDER — LEVOTHYROXINE SODIUM 150 MCG PO TABS: 150.0000 ug | ORAL_TABLET | Freq: Every day | ORAL | Status: AC

## 2013-01-16 NOTE — Telephone Encounter (Signed)
Patient requesting refill of levothyroxine. Had recent knee surgery and is in extreme pain. She isn't able to come in for any labs at this time but will f/u once her knee pain resolves.

## 2013-01-24 ENCOUNTER — Other Ambulatory Visit: Payer: Self-pay | Admitting: Orthopedic Surgery

## 2013-01-25 ENCOUNTER — Encounter (INDEPENDENT_AMBULATORY_CARE_PROVIDER_SITE_OTHER): Payer: Self-pay

## 2013-01-25 ENCOUNTER — Encounter (HOSPITAL_COMMUNITY): Payer: Self-pay | Admitting: Pharmacy Technician

## 2013-01-25 ENCOUNTER — Encounter (HOSPITAL_COMMUNITY): Payer: Self-pay

## 2013-01-25 ENCOUNTER — Encounter (HOSPITAL_COMMUNITY)
Admission: RE | Admit: 2013-01-25 | Discharge: 2013-01-25 | Disposition: A | Discharge status: Home / Self Care | Payer: Medicare Other | Source: Ambulatory Visit | Attending: Orthopedic Surgery | Admitting: Orthopedic Surgery

## 2013-01-25 ENCOUNTER — Other Ambulatory Visit: Payer: Self-pay | Admitting: Orthopedic Surgery

## 2013-01-25 DIAGNOSIS — Z01818 Encounter for other preprocedural examination: Secondary | ICD-10-CM | POA: Insufficient documentation

## 2013-01-25 DIAGNOSIS — Z01812 Encounter for preprocedural laboratory examination: Secondary | ICD-10-CM | POA: Insufficient documentation

## 2013-01-25 HISTORY — DX: Unspecified multiple injuries, initial encounter: T07.XXXA

## 2013-01-25 HISTORY — DX: Sleep disorder, unspecified: G47.9

## 2013-01-25 LAB — PROTIME-INR
INR: 1.26 (ref 0.00–1.49)
Prothrombin Time: 15.5 seconds — ABNORMAL HIGH (ref 11.6–15.2)

## 2013-01-25 LAB — BASIC METABOLIC PANEL
BUN: 16 mg/dL (ref 6–23)
Chloride: 93 mEq/L — ABNORMAL LOW (ref 96–112)
Creatinine, Ser: 0.74 mg/dL (ref 0.50–1.10)
GFR calc Af Amer: >90 mL/min (ref >90)
GFR calc non Af Amer: 78 mL/min — ABNORMAL LOW (ref >90)
Glucose, Bld: 121 mg/dL — ABNORMAL HIGH (ref 70–99)
Potassium: 4.4 mEq/L (ref 3.5–5.1)

## 2013-01-25 LAB — CBC
HCT: 28.8 % — ABNORMAL LOW (ref 36.0–46.0)
Hemoglobin: 9.1 g/dL — ABNORMAL LOW (ref 12.0–15.0)
MCHC: 31.6 g/dL (ref 30.0–36.0)
MCV: 84.2 fL (ref 78.0–100.0)
RDW: 17.2 % — ABNORMAL HIGH (ref 11.5–15.5)
WBC: 15.3 10*3/uL — ABNORMAL HIGH (ref 4.0–10.5)

## 2013-01-25 NOTE — Progress Notes (Signed)
Abnormal CBC / BMET / PT faxed to Dr. Lequita Halt

## 2013-01-25 NOTE — Patient Instructions (Addendum)
[name] [name]  01/25/2013                           YOUR PROCEDURE IS SCHEDULED ON: 01/28/13               PLEASE REPORT TO SHORT STAY CENTER AT : 1:30PM               CALL THIS NUMBER IF ANY PROBLEMS THE DAY OF SURGERY :               [phone]                      REMEMBER:   Do not eat food or drink liquids AFTER MIDNIGHT  May have clear liquids UNTIL 6 HOURS BEFORE SURGERY (10:30 AM )  Clear liquids include soda, tea, black coffee, apple or grape juice, broth.  Take these medicines the morning of surgery with A SIP OF WATER:  DOXYCYCLINE / NEXIUM / GABAPENTIN / LEVOTHYROXINE / MAY TAKE HYDROCODONE OR TRAMADOL OR ROBAXIN IF NEEDED   Do not wear jewelry, make-up   Do not wear lotions, powders, or perfumes.   Do not shave legs or underarms 12 hrs. before surgery (men may shave face)  Do not bring valuables to the hospital.  Contacts, dentures or bridgework may not be worn into surgery.  Leave suitcase in the car. After surgery it may be brought to your room.  For patients admitted to the hospital more than one night, checkout time is 11:00                          The day of discharge.   Patients discharged the day of surgery will not be allowed to drive home                             If going home same day of surgery, must have someone stay with you first                           24 hrs at home and arrange for some one to drive you home from hospital.    Special Instructions:   Please read over the following fact sheets that you were given:               1. MRSA  INFORMATION                      2.  PREPARING FOR SURGERY SHEET               3. INCENTIVE SPIROMETER                                                X_____________________________________________________________________        Failure to follow these instructions may result in cancellation of your surgery

## 2013-01-26 ENCOUNTER — Emergency Department (HOSPITAL_COMMUNITY): Payer: Medicare Other

## 2013-01-26 ENCOUNTER — Emergency Department (HOSPITAL_COMMUNITY)
Admission: EM | Admit: 2013-01-26 | Discharge: 2013-01-26 | Disposition: A | Discharge status: Home / Self Care | Payer: Medicare Other | Source: Home / Self Care | Attending: Emergency Medicine | Admitting: Emergency Medicine

## 2013-01-26 ENCOUNTER — Encounter (HOSPITAL_COMMUNITY): Payer: Self-pay | Admitting: Emergency Medicine

## 2013-01-26 DIAGNOSIS — Z9189 Other specified personal risk factors, not elsewhere classified: Secondary | ICD-10-CM | POA: Insufficient documentation

## 2013-01-26 DIAGNOSIS — E782 Mixed hyperlipidemia: Secondary | ICD-10-CM | POA: Insufficient documentation

## 2013-01-26 DIAGNOSIS — Y929 Unspecified place or not applicable: Secondary | ICD-10-CM | POA: Insufficient documentation

## 2013-01-26 DIAGNOSIS — M069 Rheumatoid arthritis, unspecified: Secondary | ICD-10-CM | POA: Insufficient documentation

## 2013-01-26 DIAGNOSIS — S0180XA Unspecified open wound of other part of head, initial encounter: Secondary | ICD-10-CM | POA: Insufficient documentation

## 2013-01-26 DIAGNOSIS — S12600A Unspecified displaced fracture of seventh cervical vertebra, initial encounter for closed fracture: Secondary | ICD-10-CM | POA: Insufficient documentation

## 2013-01-26 DIAGNOSIS — D649 Anemia, unspecified: Secondary | ICD-10-CM | POA: Insufficient documentation

## 2013-01-26 DIAGNOSIS — Z8669 Personal history of other diseases of the nervous system and sense organs: Secondary | ICD-10-CM | POA: Insufficient documentation

## 2013-01-26 DIAGNOSIS — Z23 Encounter for immunization: Secondary | ICD-10-CM | POA: Insufficient documentation

## 2013-01-26 DIAGNOSIS — E039 Hypothyroidism, unspecified: Secondary | ICD-10-CM | POA: Insufficient documentation

## 2013-01-26 DIAGNOSIS — S129XXA Fracture of neck, unspecified, initial encounter: Secondary | ICD-10-CM

## 2013-01-26 DIAGNOSIS — K219 Gastro-esophageal reflux disease without esophagitis: Secondary | ICD-10-CM | POA: Insufficient documentation

## 2013-01-26 DIAGNOSIS — E669 Obesity, unspecified: Secondary | ICD-10-CM | POA: Insufficient documentation

## 2013-01-26 DIAGNOSIS — Z792 Long term (current) use of antibiotics: Secondary | ICD-10-CM | POA: Insufficient documentation

## 2013-01-26 DIAGNOSIS — Z79899 Other long term (current) drug therapy: Secondary | ICD-10-CM | POA: Insufficient documentation

## 2013-01-26 DIAGNOSIS — Z8619 Personal history of other infectious and parasitic diseases: Secondary | ICD-10-CM | POA: Insufficient documentation

## 2013-01-26 DIAGNOSIS — Y9389 Activity, other specified: Secondary | ICD-10-CM | POA: Insufficient documentation

## 2013-01-26 DIAGNOSIS — W19XXXA Unspecified fall, initial encounter: Secondary | ICD-10-CM

## 2013-01-26 DIAGNOSIS — S0990XA Unspecified injury of head, initial encounter: Secondary | ICD-10-CM | POA: Insufficient documentation

## 2013-01-26 DIAGNOSIS — Z7982 Long term (current) use of aspirin: Secondary | ICD-10-CM | POA: Insufficient documentation

## 2013-01-26 DIAGNOSIS — Z880 Allergy status to penicillin: Secondary | ICD-10-CM | POA: Insufficient documentation

## 2013-01-26 DIAGNOSIS — Z8679 Personal history of other diseases of the circulatory system: Secondary | ICD-10-CM | POA: Insufficient documentation

## 2013-01-26 DIAGNOSIS — W1809XA Striking against other object with subsequent fall, initial encounter: Secondary | ICD-10-CM | POA: Insufficient documentation

## 2013-01-26 DIAGNOSIS — S0181XA Laceration without foreign body of other part of head, initial encounter: Secondary | ICD-10-CM

## 2013-01-26 LAB — URINALYSIS, ROUTINE W REFLEX MICROSCOPIC
Bilirubin Urine: NEGATIVE
Nitrite: NEGATIVE
Specific Gravity, Urine: 1.017 (ref 1.005–1.030)
pH: 6.5 (ref 5.0–8.0)

## 2013-01-26 LAB — BASIC METABOLIC PANEL
BUN: 11 mg/dL (ref 6–23)
CO2: 25 mEq/L (ref 19–32)
Chloride: 95 mEq/L — ABNORMAL LOW (ref 96–112)
Creatinine, Ser: 0.57 mg/dL (ref 0.50–1.10)
GFR calc Af Amer: >90 mL/min (ref >90)
GFR calc non Af Amer: 85 mL/min — ABNORMAL LOW (ref >90)
Glucose, Bld: 119 mg/dL — ABNORMAL HIGH (ref 70–99)
Potassium: 3.8 mEq/L (ref 3.5–5.1)
Sodium: 129 mEq/L — ABNORMAL LOW (ref 135–145)

## 2013-01-26 LAB — URINE MICROSCOPIC-ADD ON

## 2013-01-26 LAB — CBC
HCT: 28.3 % — ABNORMAL LOW (ref 36.0–46.0)
Hemoglobin: 9.1 g/dL — ABNORMAL LOW (ref 12.0–15.0)
MCHC: 32.2 g/dL (ref 30.0–36.0)
MCV: 83.2 fL (ref 78.0–100.0)
Platelets: 379 10*3/uL (ref 150–400)
RBC: 3.4 MIL/uL — ABNORMAL LOW (ref 3.87–5.11)
RDW: 17.1 % — ABNORMAL HIGH (ref 11.5–15.5)
WBC: 18.9 10*3/uL — ABNORMAL HIGH (ref 4.0–10.5)

## 2013-01-26 LAB — PROTIME-INR: Prothrombin Time: 15.2 seconds (ref 11.6–15.2)

## 2013-01-26 MED ORDER — TETANUS-DIPHTH-ACELL PERTUSSIS 5-2.5-18.5 LF-MCG/0.5 IM SUSP
0.5000 mL | Freq: Once | INTRAMUSCULAR | Status: AC
  Administered 2013-01-26: 0.5 mL via INTRAMUSCULAR
  Filled 2013-01-26: qty 0.5

## 2013-01-26 MED ORDER — FENTANYL CITRATE 0.05 MG/ML IJ SOLN
25.0000 ug | Freq: Once | INTRAMUSCULAR | Status: AC
  Administered 2013-01-26: 25 ug via INTRAVENOUS
  Filled 2013-01-26: qty 2

## 2013-01-26 MED ORDER — HYDROCODONE-ACETAMINOPHEN 5-325 MG PO TABS: 1.0000 | ORAL_TABLET | Freq: Four times a day (QID) | ORAL | Status: DC | PRN

## 2013-01-26 NOTE — ED Provider Notes (Signed)
Medical screening examination/treatment/procedure(s) were conducted as a shared visit with non-physician practitioner(s) and myself.  I personally evaluated the patient during the encounter.  EKG Interpretation   None       Lac repair performed by Earley Favor. Please see my note for full details of her visit.  Dagmar Hait, MD 01/26/13 [phone]

## 2013-01-26 NOTE — ED Provider Notes (Signed)
CSN: [id number]     Arrival date & time 01/26/13  0316 History   First MD Initiated Contact with Patient 01/26/13 0325     Chief Complaint  Patient presents with  . Fall  . Facial Laceration   (Consider location/radiation/quality/duration/timing/severity/associated sxs/prior Treatment) HPI Comments: Mechanical fall off bedside commode, no loss of consciousness. Fell onto L side  Patient is a 79 y.o. female presenting with fall. The history is provided by the patient.  Fall This is a new problem. The current episode started less than 1 hour ago. Episode frequency: once. The problem has been resolved. Associated symptoms include headaches. Pertinent negatives include no chest pain, no abdominal pain and no shortness of breath. Nothing aggravates the symptoms. Nothing relieves the symptoms.    Past Medical History  Diagnosis Date  . Mixed hyperlipidemia   . Hypothyroidism   . GERD (gastroesophageal reflux disease)   . Atrial fibrillation     Remote amiodarone use, recurred in 07/2011 post op from knee arthroplasty  . Obese   . History of blood transfusion 08/31/2011    A+ type  . History of GI bleed     Gastritis, was on Xarelto and ASA  . Carpal tunnel syndrome   . Arthritis     Knees  . DJD (degenerative joint disease), thoracolumbar   . Rheumatoid arthritis   . H/O bronchitis   . Hemorrhoids   . Measles     as child  . Mumps     as child  . Carotid artery disease   . PONV (postoperative nausea and vomiting)   . Anemia   . Difficulty sleeping   . Multiple bruises     DUE TO FALL 01/24/13   Past Surgical History  Procedure Laterality Date  . Tonsillectomy    . Cholecystectomy  04/29/2005    Dr. Luretha Murphy  . Thoracotomy  4/07    Removal bronchogenic cyst  . Total knee arthroplasty  08/10/2011    Procedure: TOTAL KNEE ARTHROPLASTY;  Surgeon: Loanne Drilling, MD;  Location: [facility];  Service: Orthopedics;  Laterality: Left;  . Rotator cuff repair  1992, 1993   Bilateral  . Tubal ligation      Bilateral  . Ventral hernia repair  05/25/2005    Incarcerated, lysis of adhesions, Dr. Luretha Murphy  . Appendectomy      as child  . Bronchogenic cyst  2007  . Total knee arthroplasty Right 10/08/2012    Procedure: RIGHT TOTAL KNEE ARTHROPLASTY;  Surgeon: Loanne Drilling, MD;  Location: [facility];  Service: Orthopedics;  Laterality: Right;  . Irrigation and debridement knee Right 10/11/2012    Procedure: IRRIGATION AND DEBRIDEMENT KNEE;  Surgeon: Loanne Drilling, MD;  Location: [facility];  Service: Orthopedics;  Laterality: Right;  . Irrigation and debridement knee Right 12/12/2012    Procedure: RIGHT KNEE IRRIGATION AND DEBRIDEMENT, APPLICATION OF WOUND VAC;  Surgeon: Loanne Drilling, MD;  Location: [facility];  Service: Orthopedics;  Laterality: Right;  . Joint replacement     Family History  Problem Relation Age of Onset  . Cancer    . Stroke    . Diabetes    . Hypertension    . Colon cancer Neg Hx   . Colon polyps Neg Hx   . Rectal cancer Neg Hx   . Stomach cancer Neg Hx    History  Substance Use Topics  . Smoking status: Never Smoker   . Smokeless tobacco: Never Used  .  Alcohol Use: No   OB History   Grav Para Term Preterm Abortions TAB SAB Ect Mult Living                 Review of Systems  Constitutional: Negative for fever.  Respiratory: Negative for cough and shortness of breath.   Cardiovascular: Negative for chest pain.  Gastrointestinal: Negative for abdominal pain.  Neurological: Positive for headaches.  All other systems reviewed and are negative.    Allergies  Omeprazole; Penicillins; Morphine; and Xarelto  Home Medications   Current Outpatient Rx  Name  Route  Sig  Dispense  Refill  . ALPRAZolam (XANAX) 0.5 MG tablet   Oral   Take 0.5-1 mg by mouth 2 (two) times daily. Take one tablet in the morning and two tablets in the evening.         [name] aspirin EC 81 MG tablet   Oral   Take 81 mg by mouth every morning.           . docusate sodium (COLACE) 100 MG capsule   Oral   Take 100 mg by mouth every morning.          [name] doxycycline (VIBRAMYCIN) 100 MG capsule   Oral   Take 100 mg by mouth 2 (two) times daily. She is taking for 14 days. Her pharmacy filled this for her on 01/23/13. She has not completed this regimen.         . esomeprazole (NEXIUM) 40 MG capsule   Oral   Take 40 mg by mouth every morning.          . ferrous sulfate 325 (65 FE) MG tablet   Oral   Take 325 mg by mouth 2 (two) times daily.          [name] gabapentin (NEURONTIN) 300 MG capsule   Oral   Take 300 mg by mouth 2 (two) times daily.         [name] HYDROcodone-acetaminophen (NORCO/VICODIN) 5-325 MG per tablet   Oral   Take 1-2 tablets by mouth every 4 (four) hours as needed (moderate to severe pain).   80 tablet   0   . levothyroxine (SYNTHROID, LEVOTHROID) 150 MCG tablet   Oral   Take 1 tablet (150 mcg total) by mouth daily before breakfast.   30 tablet   0   . methocarbamol (ROBAXIN) 500 MG tablet   Oral   Take 500 mg by mouth every 6 (six) hours as needed (For muscle spasms.).         [name] ondansetron (ZOFRAN) 4 MG tablet   Oral   Take 1 tablet (4 mg total) by mouth every 6 (six) hours as needed for nausea.   40 tablet   0   . promethazine (PHENERGAN) 25 MG tablet   Oral   Take 0.5-1 tablets (12.5-25 mg total) by mouth every 6 (six) hours as needed for nausea.   30 tablet   0   . simvastatin (ZOCOR) 20 MG tablet   Oral   Take 20 mg by mouth every morning.         . tolterodine (DETROL LA) 4 MG 24 hr capsule   Oral   Take 4 mg by mouth every morning.         . traMADol (ULTRAM) 50 MG tablet   Oral   Take 1 tablet (50 mg total) by mouth every 6 (six) hours as needed for pain (mild to moderate pain).   80 tablet  0    BP 172/59  Pulse 83  Temp(Src) 98.6 F (37 C) (Oral)  Resp 18  SpO2 97% Physical Exam  Nursing note and vitals reviewed. Constitutional: She is oriented to person, place,  and time. She appears well-developed and well-nourished. No distress.  HENT:  Head: Normocephalic and atraumatic.    Nose:    Eyes: EOM are normal. Pupils are equal, round, and reactive to light.  Neck: Normal range of motion. Neck supple.  Cardiovascular: Normal rate and regular rhythm.  Exam reveals no friction rub.   No murmur heard. Pulmonary/Chest: Effort normal and breath sounds normal. No respiratory distress. She has no wheezes. She has no rales.  Abdominal: Soft. She exhibits no distension. There is no tenderness. There is no rebound.  Musculoskeletal: Normal range of motion. She exhibits no edema.       Left hip: She exhibits bony tenderness (lateral hip). She exhibits no deformity and no laceration.       Cervical back: She exhibits tenderness and bony tenderness.       Thoracic back: She exhibits no bony tenderness.       Lumbar back: She exhibits no bony tenderness.       Left upper arm: She exhibits bony tenderness. She exhibits no deformity.  Neurological: She is alert and oriented to person, place, and time.  Skin: She is not diaphoretic.    ED Course  Procedures (including critical care time) Labs Review Labs Reviewed  CBC - Abnormal; Notable for the following:    WBC 18.9 (*)    RBC 3.40 (*)    Hemoglobin 9.1 (*)    HCT 28.3 (*)    RDW 17.1 (*)    All other components within normal limits  BASIC METABOLIC PANEL - Abnormal; Notable for the following:    Sodium 129 (*)    Chloride 95 (*)    Glucose, Bld 119 (*)    GFR calc non Af Amer 85 (*)    All other components within normal limits  URINALYSIS, ROUTINE W REFLEX MICROSCOPIC - Abnormal; Notable for the following:    APPearance CLOUDY (*)    Hgb urine dipstick TRACE (*)    Leukocytes, UA TRACE (*)    All other components within normal limits  URINE MICROSCOPIC-ADD ON - Abnormal; Notable for the following:    Squamous Epithelial / LPF FEW (*)    Bacteria, UA FEW (*)    All other components within  normal limits  PROTIME-INR   Imaging Review Dg Chest 2 View  01/26/2013   CLINICAL DATA:  Fall  EXAM: CHEST  2 VIEW  COMPARISON:  Prior radiograph from 12/02/2012  FINDINGS: Mild cardiomegaly is stable as compared to the prior exam.  Lungs are normally inflated. There is mild pulmonary venous congestion, similar to prior. No overt pulmonary edema. No focal infiltrate. No pneumothorax.  No acute osseous abnormality identified within the thorax. Suture anchors noted at the left humeral head. Severe degenerative osteoarthrosis noted about both shoulders.  IMPRESSION: Stable cardiomegaly with mild pulmonary venous congestion, similar as compared to prior exam. No radiographic evidence of acute traumatic injury identified.   Electronically Signed   By: Rise Mu M.D.   On: 01/26/2013 04:30   Dg Pelvis 1-2 Views  01/26/2013   CLINICAL DATA:  Fall with facial laceration.  EXAM: PELVIS - 1-2 VIEW  COMPARISON:  L-spine radiography 11/26/2012  FINDINGS: Irregularity of the upper right arcuate line. No displacement. The proximal femurs are located  and show no evidence of fracture. Lumbar dextroscoliosis with advanced lower lumbar degenerative disc disease. Osteopenia.  IMPRESSION: 1. Possible nondisplaced right sacral ala left fracture. Certainty decreased by overlapping bowel gas. If there is right-sided and posterior pelvic pain, CT could further evaluate. 2. Osteopenia.   Electronically Signed   By: Tiburcio Pea M.D.   On: 01/26/2013 05:16   Ct Head Wo Contrast  01/26/2013   CLINICAL DATA:  Fall with facial laceration  EXAM: CT HEAD WITHOUT CONTRAST  CT MAXILLOFACIAL WITHOUT CONTRAST  CT CERVICAL SPINE WITHOUT CONTRAST  TECHNIQUE: Multidetector CT imaging of the head, cervical spine, and maxillofacial structures were performed using the standard protocol without intravenous contrast. Multiplanar CT image reconstructions of the cervical spine and maxillofacial structures were also generated.   COMPARISON:  None.  FINDINGS: CT HEAD FINDINGS  Skull and Sinuses:Left temporal region scalp contusion and laceration. No radiodense foreign body or fracture. Smaller contusion to the right supraorbital region.  Orbits: No acute abnormality.  Brain: No evidence of acute abnormality, such as acute infarction, hemorrhage, hydrocephalus, or mass lesion/mass effect. Brain atrophy. There is patchy cerebral white matter low density, consistent with chronic small vessel ischemia.  CT MAXILLOFACIAL FINDINGS  Contusions as discussed above. No facial fracture. No evidence of globe injury. Clear paranasal sinuses.  CT CERVICAL SPINE FINDINGS  There is a coronally oriented fracture through the right articular process of C6. There is a sagittally oriented fracture through the spinous process of C5, also involving the right lamina. Probable displaced anteroinferior corner or osteophyte fracture at C5 and possibly at C6. There is a moderate amount of edema and hemorrhage anterior to the C6 and C7 bodies. Mild anterolisthesis noted at C7-T1.  Advanced degenerative disc disease with diffuse disc narrowing. Disc bulges are notable at C4-5 and C5-6 at least contacting and, likely compressing, the cervical cord. No gross canal hematoma.  Critical Value/emergent results were called by telephone at the time of interpretation on 01/26/2013 at 5:07 AM to Dr.WILLIAM [facility] , who verbally acknowledged these results.  IMPRESSION: 1. Right C6 articular process fracture, nondisplaced. 2. C5 spinous process and right lamina fractures, nondisplaced. 3. Question anteroinferior corner/osteophyte fractures at C5 and C6. 4. Moderate anterior perivertebral edema/hemorrhage at C6 and C7. Suspect an MR which show ligamentous injury, and upstage the cervical spine injury. 5. Degenerative disc disease with spinal canal stenosis and cord compression at C4-5 and C5-6. 6. No acute intracranial findings. Age appropriate senescent changes. 7. Negative for  facial fracture.   Electronically Signed   By: Tiburcio Pea M.D.   On: 01/26/2013 05:12   Ct Cervical Spine Wo Contrast  01/26/2013   CLINICAL DATA:  Fall with facial laceration  EXAM: CT HEAD WITHOUT CONTRAST  CT MAXILLOFACIAL WITHOUT CONTRAST  CT CERVICAL SPINE WITHOUT CONTRAST  TECHNIQUE: Multidetector CT imaging of the head, cervical spine, and maxillofacial structures were performed using the standard protocol without intravenous contrast. Multiplanar CT image reconstructions of the cervical spine and maxillofacial structures were also generated.  COMPARISON:  None.  FINDINGS: CT HEAD FINDINGS  Skull and Sinuses:Left temporal region scalp contusion and laceration. No radiodense foreign body or fracture. Smaller contusion to the right supraorbital region.  Orbits: No acute abnormality.  Brain: No evidence of acute abnormality, such as acute infarction, hemorrhage, hydrocephalus, or mass lesion/mass effect. Brain atrophy. There is patchy cerebral white matter low density, consistent with chronic small vessel ischemia.  CT MAXILLOFACIAL FINDINGS  Contusions as discussed above. No facial fracture. No evidence  of globe injury. Clear paranasal sinuses.  CT CERVICAL SPINE FINDINGS  There is a coronally oriented fracture through the right articular process of C6. There is a sagittally oriented fracture through the spinous process of C5, also involving the right lamina. Probable displaced anteroinferior corner or osteophyte fracture at C5 and possibly at C6. There is a moderate amount of edema and hemorrhage anterior to the C6 and C7 bodies. Mild anterolisthesis noted at C7-T1.  Advanced degenerative disc disease with diffuse disc narrowing. Disc bulges are notable at C4-5 and C5-6 at least contacting and, likely compressing, the cervical cord. No gross canal hematoma.  Critical Value/emergent results were called by telephone at the time of interpretation on 01/26/2013 at 5:07 AM to Dr.WILLIAM [facility] , who verbally  acknowledged these results.  IMPRESSION: 1. Right C6 articular process fracture, nondisplaced. 2. C5 spinous process and right lamina fractures, nondisplaced. 3. Question anteroinferior corner/osteophyte fractures at C5 and C6. 4. Moderate anterior perivertebral edema/hemorrhage at C6 and C7. Suspect an MR which show ligamentous injury, and upstage the cervical spine injury. 5. Degenerative disc disease with spinal canal stenosis and cord compression at C4-5 and C5-6. 6. No acute intracranial findings. Age appropriate senescent changes. 7. Negative for facial fracture.   Electronically Signed   By: Tiburcio Pea M.D.   On: 01/26/2013 05:12   Dg Humerus Left  01/26/2013   CLINICAL DATA:  Fall. Left shoulder pain  EXAM: LEFT HUMERUS - 2+ VIEW  COMPARISON:  None.  FINDINGS: No evidence of acute fracture. The humerus appears located. There is glenohumeral osteoarthritis with marginal spurs. Surgical anchor present in the proximal humerus. Osteopenia.  IMPRESSION: Negative for acute osseous injury.   Electronically Signed   By: Tiburcio Pea M.D.   On: 01/26/2013 04:34   Ct Maxillofacial Wo Cm  01/26/2013   CLINICAL DATA:  Fall with facial laceration  EXAM: CT HEAD WITHOUT CONTRAST  CT MAXILLOFACIAL WITHOUT CONTRAST  CT CERVICAL SPINE WITHOUT CONTRAST  TECHNIQUE: Multidetector CT imaging of the head, cervical spine, and maxillofacial structures were performed using the standard protocol without intravenous contrast. Multiplanar CT image reconstructions of the cervical spine and maxillofacial structures were also generated.  COMPARISON:  None.  FINDINGS: CT HEAD FINDINGS  Skull and Sinuses:Left temporal region scalp contusion and laceration. No radiodense foreign body or fracture. Smaller contusion to the right supraorbital region.  Orbits: No acute abnormality.  Brain: No evidence of acute abnormality, such as acute infarction, hemorrhage, hydrocephalus, or mass lesion/mass effect. Brain atrophy. There is  patchy cerebral white matter low density, consistent with chronic small vessel ischemia.  CT MAXILLOFACIAL FINDINGS  Contusions as discussed above. No facial fracture. No evidence of globe injury. Clear paranasal sinuses.  CT CERVICAL SPINE FINDINGS  There is a coronally oriented fracture through the right articular process of C6. There is a sagittally oriented fracture through the spinous process of C5, also involving the right lamina. Probable displaced anteroinferior corner or osteophyte fracture at C5 and possibly at C6. There is a moderate amount of edema and hemorrhage anterior to the C6 and C7 bodies. Mild anterolisthesis noted at C7-T1.  Advanced degenerative disc disease with diffuse disc narrowing. Disc bulges are notable at C4-5 and C5-6 at least contacting and, likely compressing, the cervical cord. No gross canal hematoma.  Critical Value/emergent results were called by telephone at the time of interpretation on 01/26/2013 at 5:07 AM to Dr.WILLIAM [facility] , who verbally acknowledged these results.  IMPRESSION: 1. Right C6 articular process fracture,  nondisplaced. 2. C5 spinous process and right lamina fractures, nondisplaced. 3. Question anteroinferior corner/osteophyte fractures at C5 and C6. 4. Moderate anterior perivertebral edema/hemorrhage at C6 and C7. Suspect an MR which show ligamentous injury, and upstage the cervical spine injury. 5. Degenerative disc disease with spinal canal stenosis and cord compression at C4-5 and C5-6. 6. No acute intracranial findings. Age appropriate senescent changes. 7. Negative for facial fracture.   Electronically Signed   By: Tiburcio Pea M.D.   On: 01/26/2013 05:12    EKG Interpretation   None       MDM   1. Cervical spine fracture, initial encounter   2. Fall, initial encounter   3. Forehead laceration, initial encounter    83F here s/p mechanical fall. Slipped off bedside commode. Unable to get up afterwards, however had no LOC. On aspirin.  Sustained large laceration on L forehead, small lac on bridge of nose. Aspirin daily, no other anticoagulants. States some facial pain, neck pain. Also states some L shoulder pain. Here no chest pain, lungs clear, no abdominal pain. Mild L hip pain. No leg injuries noted. Will update tetanus, image head, face, neck, chest, pelvis.  Patient's imaging shows normal Head and Face CT. Cervical spine shows multiple fractures, per radiology concern for posterior 3-column injury. I spoke with Dr. Phoebe Perch with Neurosurgery who stated she doesn't need further imaging, she can be managed in a collar. She is neurovascularly intact, moving arms and legs. She has possible R sacral ala fracture, nontender there, radiology states unlikely if nontender.  I spoke with family who will help take care of the patient. Placed in an [place] collar at all times and given a [place] collar for showering. These instructions were told to the patient. Discharged home with pain meds, neurosurgery f/u.     Dagmar Hait, MD 01/26/13 [phone]

## 2013-01-26 NOTE — ED Notes (Signed)
We have just assisted her in ambulating with a standard walker per physician request.  She performs this capably; and when we offered to turn around to go back to her room, whe chose to walk "a little more to stretch out a little".  She remains oriented x 4 and in good spirits.

## 2013-01-26 NOTE — ED Notes (Signed)
Patient transported to X-ray 

## 2013-01-26 NOTE — ED Provider Notes (Signed)
LACERATION REPAIR Performed by: Arman Filter Authorized by: Arman Filter Consent: Verbal consent obtained. Risks and benefits: risks, benefits and alternatives were discussed Consent given by: patient Patient identity confirmed: provided demographic data Prepped and Draped in normal sterile fashion Wound explored  Laceration Location: L forhead  Laceration Length: 7cm  No Foreign Bodies seen or palpated  Anesthesia: local infiltration  Local anesthetic: lidocaine 1% without epinephrine  Anesthetic total: 3 ml  Irrigation method: syringe Amount of cleaning: standard  Skin closure: simple 4-0 Prolene  Number of sutures: 9  Technique: interupted  Patient tolerance: Patient tolerated the procedure well with no immediate complications.  Arman Filter, NP 01/26/13 0600

## 2013-01-26 NOTE — ED Notes (Signed)
Pt arrived to ED with a complaint of a fall with a resulting laceration to the left upper forehead.  Laceration is 4 cm long running from the meddle of her forehead towards her ear.  Pt states she was attempting to get off he bedside commode when she lost her balance and fell into the door resulting in the laceration.  Pt states she didn't have any LOC.  Pt take aspirin daily.

## 2013-01-27 ENCOUNTER — Other Ambulatory Visit: Payer: Self-pay | Admitting: Orthopedic Surgery

## 2013-01-27 NOTE — H&P (Signed)
[name] [name]  DOB: 05/25/1930 Undefined / Language: English / Race: White Female  Date of Admission:  01/28/2013  Chief Complaint: Right Knee Infection  History of Present Illness The patient is a 79 year old female who comes in for a irrigation and debridement of the right total knee arthroplasty to be performed by Dr. Frank V. Aluisio, MD at [facility] on 01/28/2013. The patient is a 79 year old female who is now out from her right total knee arthroplasty from July 14th earlier this year which was complicated by a postop hematoma that required I&D three days later on July 17th.  She went to [facility] and was followed postoperatively.  She developed postop temps and wound drainage which did improve initially by her three week check up.  By five weeks, an area opened up and drained again and was treated with oral antibiotics and then placed on wet to dry dressings for the small wound area.  By nine weeks, the area had not improved and actually became larger causing her to require a repeat I&D of the knee on 9/17 with placement of a wound VAC.  She was seen on a regular basis in the office and had regular VAC changes with home health and had continued improvement of the wound and wound shrinkage was noted with each visit.  Due to improvement, the VAC was discontinue on 01/15/2013. Unfortunately, once the VAC was discontinued and she was started on wet to dry dressing changes, the wound started to worsen and deepen.  She was referred to the [facility] Center in [place], [address] near her home. She was seen at the wound care and developed bloody, purulent drainage.  She was seen back in the office with a worsening wound dehiscence and was scheduled for repeat I&D of the right knee wound infection. They have been treated conservatively for the above stated problem and despite conservative measures, they continue to have progressive woun d issuesand severe functional limitations and  dysfunction. They have failed non-operative management including VAC dressings, oral antibiotics, wet to dry dressings, and wound care center treatment. It is felt that they would benefit from undergoing I&D of the right total joint replacement. Risks and benefits of the procedure have been discussed with the patient and they elect to proceed with surgery. There are no active contraindications to surgery such as rapidly progressive neurological disease.   Problem List S/P total knee replacement (V43.65) Right Knee Wound Dehiscence Right Knee Wound Infection   Allergies Penicillin VK *PENICILLINS*. Rash. Morphine Sulfate *ANALGESICS - OPIOID*. Rash. Xarelto *ANTICOAGULANTS*. Gross Hematemisis   Family History Siblings. Brother, Sister. cancer Cerebrovascular Accident. grandfather fathers side Mother. deceased age 92 due to complications from old age Father. deceased age at 92 due to complications from a stroke    Social History Number of flights of stairs before winded. 2-3 Marital status. widowed Pain Contract. no Illicit drug use. no Exercise. Exercises weekly; does running / walking and other Post-Surgical Plans. She has been to [facility] in the past. Advance Directives. Living Will, Healthcare POA Drug/Alcohol Rehab (Previously). no Drug/Alcohol Rehab (Currently). no Living situation. Lives alone. Tobacco use. never smoker Tobacco / smoke exposure. no Current work status. retired Children. 2 Alcohol use. never consumed alcohol  Medication History Doxycycline Monohydrate (100MG Tablet, 1 (one) Tablet Oral two times daily, Taken starting 01/23/2013) Active. (PLEASE DELIVER TO PATIENT) TraMADol HCl (50MG Tablet, 1-2 Tablet Oral every 6-8 hours as needed for pain, Taken starting 01/23/2013) Active.   Norco (5-325MG Tablet, 1-2 Tablet Oral every 4-6 hours as needed for pain, Taken starting 01/23/2013) Active. Aspirin EC (81MG Tablet DR, Oral)  Active. Oxybutynin Chloride (10MG Tablet ER, Oral) Active. Simvastatin (20MG Tablet, Oral) Active. Ondansetron (4MG Tablet, Oral) Active. Promethazine HCl (25MG Tablet, Oral) Active. Synthroid (137MCG Tablet, Oral) Active. Zocor (20MG Tablet, Oral) Active. Detrol LA (4MG Capsule ER 24HR, Oral) Active. Xanax (0.5MG Tablet, Oral) Active. Medications Reconciled.  Past Surgical History Bronchogenic Cyst removed 2007 Appendectomy. as a child Tubal Ligation Gallbladder Surgery. laporoscopic 2007 Rotator Cuff Repair. bilateral 1992, 1993 Total Knee Replacement - Left Right Total Knee Replacement - 10/08/2012 I&D and Evacuation of Hematoma Right Knee - 10/11/2012 I&D Right Knee with VAC Placement - 12/12/2012  Medical History Rheumatoid Arthritis Cataract Vertigo Varicose veins Bronchitis Hypercholesterolemia Atrial Fibrillation. Patient had new onset episode the last time following her Left Total Knee Replacement. Gastroesophageal Reflux Disease Hemorrhoids Urinary Tract Infection Urinary Incontinence Hypothyroidism Measles. childhood Mumps. childhood   Review of Systems(Alexzandrew L Perkins, III PA-C; 09/18/2012 10:30 AM) General:Present - Intermittent Fevers; Not Present- Chills, Night Sweats, Fatigue, Weight Gain, Weight Loss and Memory Loss. Skin:Not Present- Hives, Itching, Rash, Eczema and Lesions. HEENT:Not Present- Tinnitus, Headache, Double Vision, Visual Loss, Hearing Loss and Dentures. Respiratory:Present- Shortness of breath with exertion, Cough and Wheezing. Not Present- Shortness of breath at rest, Allergies, Coughing up blood and Chronic Cough. Cardiovascular:Present- Leg Pain and/or Swelling. Not Present- Chest Pain, Racing/skipping heartbeats, Difficulty Breathing Lying Down, Murmur, Swelling and Palpitations. Gastrointestinal:Present- Heartburn, Abdominal Pain and Constipation. Not Present- Bloody Stool, Vomiting, Nausea, Diarrhea, Difficulty  Swallowing, Jaundice and Loss of appetitie. Female Genitourinary:Present- Urinary frequency, Incontinence and Urinating at Night. Not Present- Blood in Urine, Weak urinary stream, Discharge, Flank Pain, Painful Urination, Urgency and Urinary Retention. Musculoskeletal:Present- Joint Swelling, Joint Pain. Not Present- Muscle Weakness, Muscle Pain and Spasms. Neurological:Not Present- Tremor, Dizziness, Blackout spells, Paralysis, Difficulty with balance and Weakness. Psychiatric:Present- Insomnia.    Vitals Weight: 167 lb Height: 65.5 in Body Surface Area: 1.87 m Body Mass Index: 27.37 kg/m   Physical Exam The physical exam findings are as follows:  Note: Patient is a 79 year old female with continued knee pain and wound issues.  General Mental Status - Alert, cooperative and good historian. General Appearance- pleasant and Anxious. Not in acute distress. Orientation- Oriented X3. Build & Nutrition- Overweight, Well nourished and Well developed.   Head and Neck Head- normocephalic, atraumatic . Neck Global Assessment- supple. no bruit auscultated on the right and no bruit auscultated on the left.   Eye Pupil- Bilateral- Normal. Motion- Bilateral- EOMI.   Chest and Lung Exam Auscultation: Breath sounds:- clear at anterior chest wall and - clear at posterior chest wall. Adventitious sounds:- No Adventitious sounds.   Cardiovascular Auscultation:Rhythm- Regular rate and rhythm. Heart Sounds- S1 WNL and S2 WNL. Murmurs & Other Heart Sounds: Murmur 1:Location- Aortic Area. Timing- Mid-systolic. Grade- III/VI. Character- Crescendo/Decrescendo.   Abdomen Inspection:Contour- Generalized mild distention. Palpation/Percussion:Tenderness- Abdomen is non-tender to palpation. Rigidity (guarding)- Abdomen is soft. Auscultation:Auscultation of the abdomen reveals - Bowel sounds normal.   Female Genitourinary Not done, not pertinent  to present illness  Peripheral Vascular Upper Extremity: Palpation:- Pulses bilaterally normal. Lower Extremity: Palpation:- Pulses bilaterally normal.  Knee Exam On examination, she is in no distress. The open area is now about 1 x 1 cm. It is does not have any purulence. I cannot express any drainage. The patellar tendon is present at the base.  Impression: Right Knee Infection Right   Knee Wound Dehiscence S/P Right Total Knee Replacement  Plan: Patient will be admitted to undergo an I&D of the Right Knee Infection.  Surgery will be performed by Dr. Frank Aluisio.  Drew Perkins, PA-C 

## 2013-01-28 ENCOUNTER — Encounter (HOSPITAL_COMMUNITY): Payer: Self-pay | Admitting: *Deleted

## 2013-01-28 ENCOUNTER — Inpatient Hospital Stay (HOSPITAL_COMMUNITY): Payer: Medicare Other | Admitting: *Deleted

## 2013-01-28 ENCOUNTER — Inpatient Hospital Stay (HOSPITAL_COMMUNITY)
Admission: RE | Admit: 2013-01-28 | Discharge: 2013-03-05 | DRG: 463 | Disposition: A | Discharge status: Skilled Nursing Facility | Payer: Medicare Other | Source: Ambulatory Visit | Attending: Orthopedic Surgery | Admitting: Orthopedic Surgery

## 2013-01-28 ENCOUNTER — Encounter (HOSPITAL_COMMUNITY)
Admission: RE | Disposition: A | Discharge status: Skilled Nursing Facility | Payer: Self-pay | Source: Ambulatory Visit | Attending: Orthopedic Surgery

## 2013-01-28 ENCOUNTER — Encounter (HOSPITAL_COMMUNITY): Payer: Medicare Other | Admitting: *Deleted

## 2013-01-28 DIAGNOSIS — Z89529 Acquired absence of unspecified knee: Secondary | ICD-10-CM | POA: Diagnosis not present

## 2013-01-28 DIAGNOSIS — Z9289 Personal history of other medical treatment: Secondary | ICD-10-CM

## 2013-01-28 DIAGNOSIS — Z880 Allergy status to penicillin: Secondary | ICD-10-CM

## 2013-01-28 DIAGNOSIS — M5030 Other cervical disc degeneration, unspecified cervical region: Secondary | ICD-10-CM | POA: Diagnosis present

## 2013-01-28 DIAGNOSIS — E785 Hyperlipidemia, unspecified: Secondary | ICD-10-CM

## 2013-01-28 DIAGNOSIS — T8149XA Infection following a procedure, other surgical site, initial encounter: Secondary | ICD-10-CM

## 2013-01-28 DIAGNOSIS — Z87440 Personal history of urinary (tract) infections: Secondary | ICD-10-CM

## 2013-01-28 DIAGNOSIS — M069 Rheumatoid arthritis, unspecified: Secondary | ICD-10-CM | POA: Diagnosis present

## 2013-01-28 DIAGNOSIS — D62 Acute posthemorrhagic anemia: Secondary | ICD-10-CM | POA: Diagnosis present

## 2013-01-28 DIAGNOSIS — Z7982 Long term (current) use of aspirin: Secondary | ICD-10-CM

## 2013-01-28 DIAGNOSIS — Z823 Family history of stroke: Secondary | ICD-10-CM

## 2013-01-28 DIAGNOSIS — I779 Disorder of arteries and arterioles, unspecified: Secondary | ICD-10-CM | POA: Diagnosis present

## 2013-01-28 DIAGNOSIS — T8131XA Disruption of external operation (surgical) wound, not elsewhere classified, initial encounter: Secondary | ICD-10-CM | POA: Diagnosis present

## 2013-01-28 DIAGNOSIS — M25561 Pain in right knee: Secondary | ICD-10-CM

## 2013-01-28 DIAGNOSIS — I4891 Unspecified atrial fibrillation: Secondary | ICD-10-CM | POA: Diagnosis present

## 2013-01-28 DIAGNOSIS — I96 Gangrene, not elsewhere classified: Secondary | ICD-10-CM

## 2013-01-28 DIAGNOSIS — IMO0002 Reserved for concepts with insufficient information to code with codable children: Secondary | ICD-10-CM

## 2013-01-28 DIAGNOSIS — E7800 Pure hypercholesterolemia, unspecified: Secondary | ICD-10-CM | POA: Diagnosis present

## 2013-01-28 DIAGNOSIS — Z79899 Other long term (current) drug therapy: Secondary | ICD-10-CM

## 2013-01-28 DIAGNOSIS — Y831 Surgical operation with implant of artificial internal device as the cause of abnormal reaction of the patient, or of later complication, without mention of misadventure at the time of the procedure: Secondary | ICD-10-CM | POA: Diagnosis present

## 2013-01-28 DIAGNOSIS — E782 Mixed hyperlipidemia: Secondary | ICD-10-CM | POA: Diagnosis present

## 2013-01-28 DIAGNOSIS — Z01818 Encounter for other preprocedural examination: Secondary | ICD-10-CM

## 2013-01-28 DIAGNOSIS — T8450XA Infection and inflammatory reaction due to unspecified internal joint prosthesis, initial encounter: Principal | ICD-10-CM | POA: Diagnosis present

## 2013-01-28 DIAGNOSIS — M4800 Spinal stenosis, site unspecified: Secondary | ICD-10-CM | POA: Diagnosis present

## 2013-01-28 DIAGNOSIS — K219 Gastro-esophageal reflux disease without esophagitis: Secondary | ICD-10-CM | POA: Diagnosis present

## 2013-01-28 DIAGNOSIS — M009 Pyogenic arthritis, unspecified: Secondary | ICD-10-CM | POA: Diagnosis present

## 2013-01-28 DIAGNOSIS — I9789 Other postprocedural complications and disorders of the circulatory system, not elsewhere classified: Secondary | ICD-10-CM

## 2013-01-28 DIAGNOSIS — B951 Streptococcus, group B, as the cause of diseases classified elsewhere: Secondary | ICD-10-CM | POA: Diagnosis present

## 2013-01-28 DIAGNOSIS — Z23 Encounter for immunization: Secondary | ICD-10-CM

## 2013-01-28 DIAGNOSIS — E43 Unspecified severe protein-calorie malnutrition: Secondary | ICD-10-CM | POA: Diagnosis present

## 2013-01-28 DIAGNOSIS — Z01812 Encounter for preprocedural laboratory examination: Secondary | ICD-10-CM

## 2013-01-28 DIAGNOSIS — Z96659 Presence of unspecified artificial knee joint: Secondary | ICD-10-CM

## 2013-01-28 DIAGNOSIS — I10 Essential (primary) hypertension: Secondary | ICD-10-CM | POA: Diagnosis present

## 2013-01-28 DIAGNOSIS — S81009A Unspecified open wound, unspecified knee, initial encounter: Secondary | ICD-10-CM | POA: Diagnosis present

## 2013-01-28 DIAGNOSIS — D72829 Elevated white blood cell count, unspecified: Secondary | ICD-10-CM | POA: Diagnosis present

## 2013-01-28 DIAGNOSIS — A491 Streptococcal infection, unspecified site: Secondary | ICD-10-CM | POA: Diagnosis not present

## 2013-01-28 DIAGNOSIS — E039 Hypothyroidism, unspecified: Secondary | ICD-10-CM | POA: Diagnosis present

## 2013-01-28 DIAGNOSIS — E871 Hypo-osmolality and hyponatremia: Secondary | ICD-10-CM | POA: Diagnosis present

## 2013-01-28 HISTORY — PX: IRRIGATION AND DEBRIDEMENT KNEE: SHX5185

## 2013-01-28 LAB — PREPARE RBC (CROSSMATCH)

## 2013-01-28 SURGERY — IRRIGATION AND DEBRIDEMENT KNEE
Anesthesia: General | Site: Knee | Laterality: Right | Wound class: Dirty or Infected

## 2013-01-28 MED ORDER — SODIUM CHLORIDE 0.9 % IJ SOLN
INTRAMUSCULAR | Status: AC
  Filled 2013-01-28: qty 50

## 2013-01-28 MED ORDER — DOCUSATE SODIUM 100 MG PO CAPS
100.0000 mg | ORAL_CAPSULE | Freq: Two times a day (BID) | ORAL | Status: DC
  Administered 2013-01-28 – 2013-03-05 (×70): 100 mg via ORAL
  Filled 2013-01-28 (×14): qty 1

## 2013-01-28 MED ORDER — PHENOL 1.4 % MT LIQD: 1.0000 | OROMUCOSAL | Status: DC | PRN

## 2013-01-28 MED ORDER — HYDROMORPHONE HCL PF 1 MG/ML IJ SOLN
0.2500 mg | INTRAMUSCULAR | Status: DC | PRN
  Administered 2013-01-28 (×4): 0.5 mg via INTRAVENOUS

## 2013-01-28 MED ORDER — SODIUM CHLORIDE 0.9 % IR SOLN
Status: DC | PRN
  Administered 2013-01-28: 6000 mL

## 2013-01-28 MED ORDER — ESOMEPRAZOLE MAGNESIUM 40 MG PO CPDR
40.0000 mg | DELAYED_RELEASE_CAPSULE | Freq: Every day | ORAL | Status: DC
  Administered 2013-01-29 – 2013-03-05 (×36): 40 mg via ORAL
  Filled 2013-01-28 (×37): qty 1

## 2013-01-28 MED ORDER — EPHEDRINE SULFATE 50 MG/ML IJ SOLN
INTRAMUSCULAR | Status: DC | PRN
  Administered 2013-01-28: 10 mg via INTRAVENOUS
  Administered 2013-01-28: 5 mg via INTRAVENOUS

## 2013-01-28 MED ORDER — KETAMINE HCL 10 MG/ML IJ SOLN
INTRAMUSCULAR | Status: DC | PRN
  Administered 2013-01-28: 25 mg via INTRAVENOUS

## 2013-01-28 MED ORDER — ONDANSETRON HCL 4 MG PO TABS
4.0000 mg | ORAL_TABLET | Freq: Four times a day (QID) | ORAL | Status: DC | PRN
Start: 2013-01-28 — End: 2013-03-05

## 2013-01-28 MED ORDER — LACTATED RINGERS IV SOLN
INTRAVENOUS | Status: DC
  Administered 2013-01-28 (×2): via INTRAVENOUS
  Administered 2013-01-28: 1000 mL via INTRAVENOUS

## 2013-01-28 MED ORDER — VANCOMYCIN HCL 1000 MG IV SOLR
INTRAVENOUS | Status: AC
  Filled 2013-01-28: qty 4000

## 2013-01-28 MED ORDER — ONDANSETRON HCL 4 MG PO TABS: 4.0000 mg | ORAL_TABLET | Freq: Four times a day (QID) | ORAL | Status: DC | PRN

## 2013-01-28 MED ORDER — VANCOMYCIN HCL 1000 MG IV SOLR
INTRAVENOUS | Status: DC | PRN
  Administered 2013-01-28: 3000 mg

## 2013-01-28 MED ORDER — BUPIVACAINE LIPOSOME 1.3 % IJ SUSP
20.0000 mL | Freq: Once | INTRAMUSCULAR | Status: DC
  Filled 2013-01-28: qty 20

## 2013-01-28 MED ORDER — FENTANYL CITRATE 0.05 MG/ML IJ SOLN
INTRAMUSCULAR | Status: DC | PRN
  Administered 2013-01-28 (×6): 50 ug via INTRAVENOUS

## 2013-01-28 MED ORDER — METOCLOPRAMIDE HCL 5 MG/ML IJ SOLN
5.0000 mg | Freq: Three times a day (TID) | INTRAMUSCULAR | Status: DC | PRN
Start: 2013-01-28 — End: 2013-03-05

## 2013-01-28 MED ORDER — BUPIVACAINE HCL (PF) 0.25 % IJ SOLN
INTRAMUSCULAR | Status: AC
  Filled 2013-01-28: qty 30

## 2013-01-28 MED ORDER — METOCLOPRAMIDE HCL 5 MG/ML IJ SOLN
INTRAMUSCULAR | Status: DC | PRN
  Administered 2013-01-28: 10 mg via INTRAVENOUS

## 2013-01-28 MED ORDER — NON FORMULARY: 40.0000 mg | Freq: Every day | Status: DC

## 2013-01-28 MED ORDER — ALPRAZOLAM 1 MG PO TABS
1.0000 mg | ORAL_TABLET | Freq: Every day | ORAL | Status: DC
  Administered 2013-01-28 – 2013-03-04 (×36): 1 mg via ORAL
  Filled 2013-01-28 (×38): qty 1

## 2013-01-28 MED ORDER — HYDROMORPHONE HCL PF 1 MG/ML IJ SOLN
INTRAMUSCULAR | Status: AC
  Administered 2013-01-30: 0.5 mg via INTRAVENOUS
  Filled 2013-01-28: qty 1

## 2013-01-28 MED ORDER — PROMETHAZINE HCL 25 MG PO TABS
12.5000 mg | ORAL_TABLET | Freq: Four times a day (QID) | ORAL | Status: DC | PRN
  Administered 2013-01-28: 25 mg via ORAL
  Administered 2013-02-07 – 2013-02-16 (×4): 12.5 mg via ORAL
  Filled 2013-01-28 (×4): qty 1

## 2013-01-28 MED ORDER — DIPHENHYDRAMINE HCL 12.5 MG/5ML PO ELIX: 12.5000 mg | ORAL_SOLUTION | ORAL | Status: DC | PRN

## 2013-01-28 MED ORDER — VANCOMYCIN HCL IN DEXTROSE 1-5 GM/200ML-% IV SOLN
INTRAVENOUS | Status: AC
  Filled 2013-01-28: qty 200

## 2013-01-28 MED ORDER — VANCOMYCIN HCL 500 MG IV SOLR
500.0000 mg | Freq: Two times a day (BID) | INTRAVENOUS | Status: DC
  Administered 2013-01-29 – 2013-01-30 (×3): 500 mg via INTRAVENOUS
  Filled 2013-01-28 (×5): qty 500

## 2013-01-28 MED ORDER — DEXAMETHASONE SODIUM PHOSPHATE 10 MG/ML IJ SOLN
10.0000 mg | Freq: Every day | INTRAMUSCULAR | Status: AC
  Filled 2013-01-28: qty 1

## 2013-01-28 MED ORDER — METHOCARBAMOL 500 MG PO TABS: 500.0000 mg | ORAL_TABLET | Freq: Four times a day (QID) | ORAL | Status: DC | PRN

## 2013-01-28 MED ORDER — TOBRAMYCIN SULFATE 1.2 G IJ SOLR
INTRAMUSCULAR | Status: AC
  Filled 2013-01-28: qty 4.8

## 2013-01-28 MED ORDER — POLYETHYLENE GLYCOL 3350 17 G PO PACK
17.0000 g | PACK | Freq: Every day | ORAL | Status: DC | PRN
  Administered 2013-01-30 – 2013-02-27 (×7): 17 g via ORAL
  Filled 2013-01-28: qty 1

## 2013-01-28 MED ORDER — MIDAZOLAM HCL 5 MG/5ML IJ SOLN
INTRAMUSCULAR | Status: DC | PRN
  Administered 2013-01-28 (×2): 1 mg via INTRAVENOUS

## 2013-01-28 MED ORDER — ACETAMINOPHEN 650 MG RE SUPP: 650.0000 mg | Freq: Four times a day (QID) | RECTAL | Status: DC | PRN

## 2013-01-28 MED ORDER — SODIUM CHLORIDE 0.9 % IV SOLN: INTRAVENOUS | Status: DC

## 2013-01-28 MED ORDER — ALPRAZOLAM 0.5 MG PO TABS: 0.5000 mg | ORAL_TABLET | Freq: Two times a day (BID) | ORAL | Status: DC

## 2013-01-28 MED ORDER — DEXAMETHASONE 6 MG PO TABS
10.0000 mg | ORAL_TABLET | Freq: Every day | ORAL | Status: AC
  Administered 2013-01-29: 10 mg via ORAL
  Filled 2013-01-28: qty 1

## 2013-01-28 MED ORDER — FLEET ENEMA 7-19 GM/118ML RE ENEM: 1.0000 | ENEMA | Freq: Once | RECTAL | Status: AC | PRN

## 2013-01-28 MED ORDER — METHOCARBAMOL 100 MG/ML IJ SOLN
500.0000 mg | Freq: Four times a day (QID) | INTRAVENOUS | Status: DC | PRN
  Administered 2013-02-25: 500 mg via INTRAVENOUS
  Filled 2013-01-28 (×2): qty 5

## 2013-01-28 MED ORDER — METOCLOPRAMIDE HCL 10 MG PO TABS: 5.0000 mg | ORAL_TABLET | Freq: Three times a day (TID) | ORAL | Status: DC | PRN

## 2013-01-28 MED ORDER — VANCOMYCIN HCL IN DEXTROSE 1-5 GM/200ML-% IV SOLN
1000.0000 mg | INTRAVENOUS | Status: AC
  Administered 2013-01-28: 1000 mg via INTRAVENOUS

## 2013-01-28 MED ORDER — SIMVASTATIN 20 MG PO TABS
20.0000 mg | ORAL_TABLET | Freq: Every morning | ORAL | Status: DC
  Administered 2013-01-29 – 2013-03-05 (×36): 20 mg via ORAL
  Filled 2013-01-28 (×36): qty 1

## 2013-01-28 MED ORDER — HYDROCODONE-ACETAMINOPHEN 5-325 MG PO TABS
1.0000 | ORAL_TABLET | ORAL | Status: DC | PRN
  Administered 2013-01-29 – 2013-01-30 (×7): 1 via ORAL
  Administered 2013-01-31: 2 via ORAL
  Administered 2013-01-31 (×2): 1 via ORAL
  Administered 2013-01-31 (×3): 2 via ORAL
  Administered 2013-02-01: 1 via ORAL
  Administered 2013-02-01 – 2013-02-02 (×7): 2 via ORAL
  Administered 2013-02-03: 1 via ORAL
  Administered 2013-02-03 (×2): 2 via ORAL
  Administered 2013-02-03: 22:00:00 1 via ORAL
  Administered 2013-02-03 – 2013-02-06 (×12): 2 via ORAL
  Administered 2013-02-07 – 2013-02-09 (×6): 1 via ORAL
  Administered 2013-02-09 – 2013-02-17 (×28): 2 via ORAL
  Administered 2013-02-17: 1 via ORAL
  Administered 2013-02-17 – 2013-02-26 (×36): 2 via ORAL
  Administered 2013-02-27: 08:00:00 1 via ORAL
  Administered 2013-02-27 – 2013-03-05 (×25): 2 via ORAL
  Filled 2013-01-28 (×2): qty 2
  Filled 2013-01-28: qty 1
  Filled 2013-01-28 (×12): qty 2
  Filled 2013-01-28: qty 1
  Filled 2013-01-28 (×6): qty 2
  Filled 2013-01-28: qty 1
  Filled 2013-01-28 (×19): qty 2
  Filled 2013-01-28: qty 1
  Filled 2013-01-28 (×5): qty 2
  Filled 2013-01-28: qty 1
  Filled 2013-01-28 (×21): qty 2
  Filled 2013-01-28: qty 1
  Filled 2013-01-28 (×4): qty 2
  Filled 2013-01-28 (×2): qty 1
  Filled 2013-01-28 (×25): qty 2
  Filled 2013-01-28: qty 1
  Filled 2013-01-28 (×7): qty 2
  Filled 2013-01-28: qty 1
  Filled 2013-01-28 (×8): qty 2
  Filled 2013-01-28: qty 1
  Filled 2013-01-28: qty 2
  Filled 2013-01-28: qty 1
  Filled 2013-01-28 (×4): qty 2
  Filled 2013-01-28: qty 1
  Filled 2013-01-28: qty 2
  Filled 2013-01-28: qty 1
  Filled 2013-01-28 (×6): qty 2
  Filled 2013-01-28: qty 1

## 2013-01-28 MED ORDER — ONDANSETRON HCL 4 MG/2ML IJ SOLN
INTRAMUSCULAR | Status: DC | PRN
  Administered 2013-01-28: 4 mg via INTRAVENOUS

## 2013-01-28 MED ORDER — ASPIRIN EC 325 MG PO TBEC
325.0000 mg | DELAYED_RELEASE_TABLET | Freq: Every day | ORAL | Status: DC
  Administered 2013-01-29 – 2013-02-05 (×8): 325 mg via ORAL
  Filled 2013-01-28 (×9): qty 1

## 2013-01-28 MED ORDER — DEXAMETHASONE SODIUM PHOSPHATE 4 MG/ML IJ SOLN
INTRAMUSCULAR | Status: DC | PRN
  Administered 2013-01-28: 10 mg via INTRAVENOUS

## 2013-01-28 MED ORDER — MENTHOL 3 MG MT LOZG
1.0000 | LOZENGE | OROMUCOSAL | Status: DC | PRN
  Filled 2013-01-28: qty 9

## 2013-01-28 MED ORDER — GABAPENTIN 300 MG PO CAPS
300.0000 mg | ORAL_CAPSULE | Freq: Two times a day (BID) | ORAL | Status: DC
  Administered 2013-01-28 – 2013-03-05 (×71): 300 mg via ORAL
  Filled 2013-01-28 (×76): qty 1

## 2013-01-28 MED ORDER — PROMETHAZINE HCL 25 MG/ML IJ SOLN: 6.2500 mg | INTRAMUSCULAR | Status: DC | PRN

## 2013-01-28 MED ORDER — FESOTERODINE FUMARATE ER 8 MG PO TB24
8.0000 mg | ORAL_TABLET | Freq: Every day | ORAL | Status: DC
  Administered 2013-01-29 – 2013-03-05 (×36): 8 mg via ORAL
  Filled 2013-01-28 (×37): qty 1

## 2013-01-28 MED ORDER — FERROUS SULFATE 325 (65 FE) MG PO TABS
325.0000 mg | ORAL_TABLET | Freq: Two times a day (BID) | ORAL | Status: DC
  Administered 2013-01-28 – 2013-03-05 (×68): 325 mg via ORAL
  Filled 2013-01-28 (×74): qty 1

## 2013-01-28 MED ORDER — ALPRAZOLAM 0.5 MG PO TABS
0.5000 mg | ORAL_TABLET | Freq: Every day | ORAL | Status: DC
  Administered 2013-01-29 – 2013-03-05 (×32): 0.5 mg via ORAL
  Filled 2013-01-28 (×32): qty 1

## 2013-01-28 MED ORDER — DEXAMETHASONE SODIUM PHOSPHATE 10 MG/ML IJ SOLN: 10.0000 mg | Freq: Once | INTRAMUSCULAR | Status: DC

## 2013-01-28 MED ORDER — CHLORHEXIDINE GLUCONATE 4 % EX LIQD: 60.0000 mL | Freq: Once | CUTANEOUS | Status: DC

## 2013-01-28 MED ORDER — PROPOFOL 10 MG/ML IV BOLUS
INTRAVENOUS | Status: DC | PRN
  Administered 2013-01-28: 120 mg via INTRAVENOUS

## 2013-01-28 MED ORDER — ACETAMINOPHEN 10 MG/ML IV SOLN
1000.0000 mg | Freq: Once | INTRAVENOUS | Status: AC
  Administered 2013-01-28: 1000 mg via INTRAVENOUS
  Filled 2013-01-28: qty 100

## 2013-01-28 MED ORDER — METHOCARBAMOL 500 MG PO TABS
500.0000 mg | ORAL_TABLET | Freq: Four times a day (QID) | ORAL | Status: DC | PRN
  Administered 2013-01-28 – 2013-03-04 (×40): 500 mg via ORAL
  Filled 2013-01-28 (×41): qty 1

## 2013-01-28 MED ORDER — BISACODYL 10 MG RE SUPP
10.0000 mg | Freq: Every day | RECTAL | Status: DC | PRN
  Administered 2013-02-02: 09:00:00 10 mg via RECTAL
  Filled 2013-01-28 (×3): qty 1

## 2013-01-28 MED ORDER — ONDANSETRON HCL 4 MG/2ML IJ SOLN
4.0000 mg | Freq: Four times a day (QID) | INTRAMUSCULAR | Status: DC | PRN
  Administered 2013-02-05: 04:00:00 4 mg via INTRAVENOUS
  Filled 2013-01-28: qty 2

## 2013-01-28 MED ORDER — 0.9 % SODIUM CHLORIDE (POUR BTL) OPTIME
TOPICAL | Status: DC | PRN
  Administered 2013-01-28: 1000 mL

## 2013-01-28 MED ORDER — HYDROMORPHONE HCL PF 1 MG/ML IJ SOLN
0.5000 mg | INTRAMUSCULAR | Status: DC | PRN
  Administered 2013-01-30 – 2013-03-04 (×45): 0.5 mg via INTRAVENOUS
  Filled 2013-01-28 (×45): qty 1

## 2013-01-28 MED ORDER — LEVOTHYROXINE SODIUM 150 MCG PO TABS
150.0000 ug | ORAL_TABLET | Freq: Every day | ORAL | Status: DC
  Administered 2013-01-29 – 2013-03-05 (×36): 150 ug via ORAL
  Filled 2013-01-28 (×40): qty 1

## 2013-01-28 MED ORDER — HYDROMORPHONE HCL PF 1 MG/ML IJ SOLN
INTRAMUSCULAR | Status: DC | PRN
  Administered 2013-01-28 (×2): 1 mg via INTRAVENOUS

## 2013-01-28 MED ORDER — ACETAMINOPHEN 325 MG PO TABS
650.0000 mg | ORAL_TABLET | Freq: Four times a day (QID) | ORAL | Status: DC | PRN
  Administered 2013-01-30 – 2013-02-26 (×3): 650 mg via ORAL
  Filled 2013-01-28 (×2): qty 2

## 2013-01-28 MED ORDER — SODIUM CHLORIDE 0.9 % IV SOLN
INTRAVENOUS | Status: DC
  Administered 2013-01-28 – 2013-02-06 (×4): via INTRAVENOUS

## 2013-01-28 SURGICAL SUPPLY — 51 items
BAG SPEC THK2 15X12 ZIP CLS (MISCELLANEOUS)
BAG ZIPLOCK 12X15 (MISCELLANEOUS) ×1 IMPLANT
BANDAGE ELASTIC 6 VELCRO ST LF (GAUZE/BANDAGES/DRESSINGS) ×2 IMPLANT
BANDAGE ESMARK 6X9 LF (GAUZE/BANDAGES/DRESSINGS) ×1 IMPLANT
BLADE SAG 18X100X1.27 (BLADE) ×1 IMPLANT
BNDG CMPR 9X6 STRL LF SNTH (GAUZE/BANDAGES/DRESSINGS) ×1
BNDG ESMARK 6X9 LF (GAUZE/BANDAGES/DRESSINGS) ×2
CEMENT HV SMART SET (Cement) ×3 IMPLANT
CLOTH BEACON ORANGE TIMEOUT ST (SAFETY) ×1 IMPLANT
CONT SPECI 4OZ STER CLIK (MISCELLANEOUS) ×1 IMPLANT
CUFF TOURN SGL QUICK 34 (TOURNIQUET CUFF) ×2
CUFF TRNQT CYL 34X4X40X1 (TOURNIQUET CUFF) ×1 IMPLANT
DRAPE EXTREMITY T 121X128X90 (DRAPE) ×2 IMPLANT
DRAPE POUCH INSTRU U-SHP 10X18 (DRAPES) ×2 IMPLANT
DRAPE U-SHAPE 47X51 STRL (DRAPES) ×2 IMPLANT
DRSG ADAPTIC 3X8 NADH LF (GAUZE/BANDAGES/DRESSINGS) ×1 IMPLANT
DRSG EMULSION OIL 3X16 NADH (GAUZE/BANDAGES/DRESSINGS) ×1 IMPLANT
DRSG PAD ABDOMINAL 8X10 ST (GAUZE/BANDAGES/DRESSINGS) ×2 IMPLANT
DURAPREP 26ML APPLICATOR (WOUND CARE) ×3 IMPLANT
ELECT REM PT RETURN 9FT ADLT (ELECTROSURGICAL) ×2
ELECTRODE REM PT RTRN 9FT ADLT (ELECTROSURGICAL) ×1 IMPLANT
EVACUATOR 1/8 PVC DRAIN (DRAIN) ×2 IMPLANT
GLOVE BIO SURGEON STRL SZ7.5 (GLOVE) ×3 IMPLANT
GLOVE BIO SURGEON STRL SZ8 (GLOVE) ×4 IMPLANT
GLOVE BIOGEL PI IND STRL 8 (GLOVE) ×2 IMPLANT
GLOVE BIOGEL PI INDICATOR 8 (GLOVE) ×2
GLOVE ECLIPSE 8.0 STRL XLNG CF (GLOVE) ×2 IMPLANT
GOWN PREVENTION PLUS LG XLONG (DISPOSABLE) ×1 IMPLANT
GOWN STRL REIN XL XLG (GOWN DISPOSABLE) ×6 IMPLANT
HANDPIECE INTERPULSE COAX TIP (DISPOSABLE) ×2
IMMOBILIZER KNEE 20 (SOFTGOODS) ×2
IMMOBILIZER KNEE 20 THIGH 36 (SOFTGOODS) IMPLANT
KIT BASIN OR (CUSTOM PROCEDURE TRAY) ×2 IMPLANT
MANIFOLD NEPTUNE II (INSTRUMENTS) ×2 IMPLANT
PACK TOTAL JOINT (CUSTOM PROCEDURE TRAY) ×2 IMPLANT
PADDING CAST ABS 6INX4YD NS (CAST SUPPLIES) ×1
PADDING CAST ABS COTTON 6X4 NS (CAST SUPPLIES) IMPLANT
PADDING CAST COTTON 6X4 STRL (CAST SUPPLIES) ×5 IMPLANT
POSITIONER SURGICAL ARM (MISCELLANEOUS) ×2 IMPLANT
SET HNDPC FAN SPRY TIP SCT (DISPOSABLE) ×1 IMPLANT
SPONGE GAUZE 4X4 12PLY (GAUZE/BANDAGES/DRESSINGS) ×2 IMPLANT
STAPLER VISISTAT 35W (STAPLE) ×1 IMPLANT
STRIP CLOSURE SKIN 1/2X4 (GAUZE/BANDAGES/DRESSINGS) ×2 IMPLANT
SUT MNCRL AB 4-0 PS2 18 (SUTURE) ×1 IMPLANT
SUT PDS AB 1 CT1 27 (SUTURE) ×2 IMPLANT
SUT VIC AB 2-0 CT1 27 (SUTURE) ×8
SUT VIC AB 2-0 CT1 TAPERPNT 27 (SUTURE) ×3 IMPLANT
SUT VLOC 180 0 24IN GS25 (SUTURE) ×1 IMPLANT
SWAB COLLECTION DEVICE MRSA (MISCELLANEOUS) ×2 IMPLANT
TOWEL OR 17X26 10 PK STRL BLUE (TOWEL DISPOSABLE) ×4 IMPLANT
TUBE ANAEROBIC SPECIMEN COL (MISCELLANEOUS) ×2 IMPLANT

## 2013-01-28 NOTE — Interval H&P Note (Signed)
History and Physical Interval Note:  01/28/2013 3:43 PM  [name] [name]  has presented today for surgery, with the diagnosis of RIGHT KNEE INFECTION   The various methods of treatment have been discussed with the patient and family. After consideration of risks, benefits and other options for treatment, the patient has consented to  Procedure(s): INCISION AND DRAINAGE RIGHT KNEE POSSIBLE RESECTION ARTHROPLASTY    (Right) as a surgical intervention .  The patient's history has been reviewed, patient examined, no change in status, stable for surgery.  I have reviewed the patient's chart and labs.  Questions were answered to the patient's satisfaction.     Loanne Drilling

## 2013-01-28 NOTE — H&P (View-Only) (Signed)
[name] [name]  DOB: 03-05-1931 Undefined / Language: [place] / Race: White Female  Date of Admission:  01/28/2013  Chief Complaint: Right Knee Infection  History of Present Illness The patient is a 79 year old female who comes in for a irrigation and debridement of the right total knee arthroplasty to be performed by Dr. Gus Rankin. Aluisio, MD at [facility] on 01/28/2013. The patient is a 79 year old female who is now out from her right total knee arthroplasty from July 14th earlier this year which was complicated by a postop hematoma that required I&D three days later on July 17th.  She went to [facility] and was followed postoperatively.  She developed postop temps and wound drainage which did improve initially by her three week check up.  By five weeks, an area opened up and drained again and was treated with oral antibiotics and then placed on wet to dry dressings for the small wound area.  By nine weeks, the area had not improved and actually became larger causing her to require a repeat I&D of the knee on 9/17 with placement of a wound VAC.  She was seen on a regular basis in the office and had regular VAC changes with home health and had continued improvement of the wound and wound shrinkage was noted with each visit.  Due to improvement, the VAC was discontinue on 01/15/2013. Unfortunately, once the [facility] was discontinued and she was started on wet to dry dressing changes, the wound started to worsen and deepen.  She was referred to the Wound Care Center in [place], [place] near her home. She was seen at the wound care and developed bloody, purulent drainage.  She was seen back in the office with a worsening wound dehiscence and was scheduled for repeat I&D of the right knee wound infection. They have been treated conservatively for the above stated problem and despite conservative measures, they continue to have progressive woun d issuesand severe functional limitations and  dysfunction. They have failed non-operative management including VAC dressings, oral antibiotics, wet to dry dressings, and wound care center treatment. It is felt that they would benefit from undergoing I&D of the right total joint replacement. Risks and benefits of the procedure have been discussed with the patient and they elect to proceed with surgery. There are no active contraindications to surgery such as rapidly progressive neurological disease.   Problem List S/P total knee replacement (V43.65) Right Knee Wound Dehiscence Right Knee Wound Infection   Allergies Penicillin VK *PENICILLINS*. Rash. Morphine Sulfate *ANALGESICS - OPIOID*. Rash. Xarelto *ANTICOAGULANTS*. Gross Hematemisis   Family History Siblings. Brother, Sister. cancer Cerebrovascular Accident. grandfather fathers side Mother. deceased age 64 due to complications from old age Father. deceased age at 58 due to complications from a stroke    Social History Number of flights of stairs before winded. 2-3 Marital status. widowed Pain Contract. no Illicit drug use. no Exercise. Exercises weekly; does running / walking and other Post-Surgical Plans. She has been to [facility] in the past. Advance Directives. Living Will, Healthcare POA Drug/Alcohol Rehab (Previously). no Drug/Alcohol Rehab (Currently). no Living situation. Lives alone. Tobacco use. never smoker Tobacco / smoke exposure. no Current work status. retired Copywriter, advertising. 2 Alcohol use. never consumed alcohol  Medication History Doxycycline Monohydrate (100MG  Tablet, 1 (one) Tablet Oral two times daily, Taken starting 01/23/2013) Active. (PLEASE DELIVER TO PATIENT) TraMADol HCl (50MG  Tablet, 1-2 Tablet Oral every 6-8 hours as needed for pain, Taken starting 01/23/2013) Active.  Norco (5-325MG  Tablet, 1-2 Tablet Oral every 4-6 hours as needed for pain, Taken starting 01/23/2013) Active. Aspirin EC (81MG  Tablet DR, Oral)  Active. Oxybutynin Chloride (10MG  Tablet ER, Oral) Active. Simvastatin (20MG  Tablet, Oral) Active. Ondansetron (4MG  Tablet, Oral) Active. Promethazine HCl (25MG  Tablet, Oral) Active. Synthroid ( Tablet, Oral) Active. Zocor (20MG  Tablet, Oral) Active. Detrol LA (4MG  Capsule ER 24HR, Oral) Active. Xanax (0.5MG  Tablet, Oral) Active. Medications Reconciled.  Past Surgical History Bronchogenic Cyst removed 2007 Appendectomy. as a child Tubal Ligation Gallbladder Surgery. laporoscopic 2007 Rotator Cuff Repair. bilateral 1992, 1993 Total Knee Replacement - Left Right Total Knee Replacement - 10/08/2012 I&D and Evacuation of Hematoma Right Knee - 10/11/2012 I&D Right Knee with [facility] Placement - 12/12/2012  Medical History Rheumatoid Arthritis Cataract Vertigo Varicose veins Bronchitis Hypercholesterolemia Atrial Fibrillation. Patient had new onset episode the last time following her Left Total Knee Replacement. Gastroesophageal Reflux Disease Hemorrhoids Urinary Tract Infection Urinary Incontinence Hypothyroidism Measles. childhood Mumps. childhood   Review of Systems(Konner Saiz L Samyiah Halvorsen, III PA-C; 09/18/2012 10:30 AM) General:Present - Intermittent Fevers; Not Present- Chills, Night Sweats, Fatigue, Weight Gain, Weight Loss and Memory Loss. Skin:Not Present- Hives, Itching, Rash, Eczema and Lesions. HEENT:Not Present- Tinnitus, Headache, Double Vision, Visual Loss, Hearing Loss and Dentures. Respiratory:Present- Shortness of breath with exertion, Cough and Wheezing. Not Present- Shortness of breath at rest, Allergies, Coughing up blood and Chronic Cough. Cardiovascular:Present- Leg Pain and/or Swelling. Not Present- Chest Pain, Racing/skipping heartbeats, Difficulty Breathing Lying Down, Murmur, Swelling and Palpitations. Gastrointestinal:Present- Heartburn, Abdominal Pain and Constipation. Not Present- Bloody Stool, Vomiting, Nausea, Diarrhea, Difficulty  Swallowing, Jaundice and Loss of appetitie. Female Genitourinary:Present- Urinary frequency, Incontinence and Urinating at Night. Not Present- Blood in Urine, Weak urinary stream, Discharge, Flank Pain, Painful Urination, Urgency and Urinary Retention. Musculoskeletal:Present- Joint Swelling, Joint Pain. Not Present- Muscle Weakness, Muscle Pain and Spasms. Neurological:Not Present- Tremor, Dizziness, Blackout spells, Paralysis, Difficulty with balance and Weakness. Psychiatric:Present- Insomnia.    Vitals Weight: 167 lb Height: 65.5 in Body Surface Area: 1.87 m Body Mass Index: 27.37 kg/m   Physical Exam The physical exam findings are as follows:  Note: Patient is a 79 year old female with continued knee pain and wound issues.  General Mental Status - Alert, cooperative and good historian. General Appearance- pleasant and Anxious. Not in acute distress. Orientation- Oriented X3. Build & Nutrition- Overweight, Well nourished and Well developed.   Head and Neck Head- normocephalic, atraumatic . Neck Global Assessment- supple. no bruit auscultated on the right and no bruit auscultated on the left.   Eye Pupil- Bilateral- Normal. Motion- Bilateral- EOMI.   Chest and Lung Exam Auscultation: Breath sounds:- clear at anterior chest wall and - clear at posterior chest wall. Adventitious sounds:- No Adventitious sounds.   Cardiovascular Auscultation:Rhythm- Regular rate and rhythm. Heart Sounds- S1 WNL and S2 WNL. Murmurs & Other Heart Sounds: Murmur 1:Location- Aortic Area. Timing- Mid-systolic. Grade- III/VI. Character- Crescendo/Decrescendo.   Abdomen Inspection:Contour- Generalized mild distention. Palpation/Percussion:Tenderness- Abdomen is non-tender to palpation. Rigidity (guarding)- Abdomen is soft. Auscultation:Auscultation of the abdomen reveals - Bowel sounds normal.   Female Genitourinary Not done, not pertinent  to present illness  Peripheral Vascular Upper Extremity: Palpation:- Pulses bilaterally normal. Lower Extremity: Palpation:- Pulses bilaterally normal.  Knee Exam On examination, she is in no distress. The open area is now about 1 x 1 cm. It is does not have any purulence. I cannot express any drainage. The patellar tendon is present at the base.  Impression: Right Knee Infection Right  Knee Wound Dehiscence S/P Right Total Knee Replacement  Plan: Patient will be admitted to undergo an I&D of the Right Knee Infection.  Surgery will be performed by Dr. Ollen Gross.  Avel Peace, PA-C

## 2013-01-28 NOTE — Progress Notes (Signed)
ANTIBIOTIC CONSULT NOTE - INITIAL  Pharmacy Consult for vancomycin Indication: septic arthritis of knee  Allergies  Allergen Reactions  . Omeprazole Nausea And Vomiting  . Penicillins Itching  . Morphine Rash    And hot flashes  . Xarelto [Rivaroxaban] Other (See Comments)    Bleeding,spitting up blood    Patient Measurements: Height: 5' 5.35" (166 cm) (Documented in chart 01/25/13) Weight: 149 lb 14.6 oz (68 kg) (Documented in chart 01/25/13) IBW/kg (Calculated) : 57.81 Adjusted Body Weight:   Vital Signs: Temp: 97.9 F (36.6 C) (11/03 2045) Temp src: Oral (11/03 2045) BP: 167/79 mmHg (11/03 2045) Pulse Rate: 89 (11/03 2050) Intake/Output from previous day:   Intake/Output from this shift: Total I/O In: 1033.3 [I.V.:1033.3] Out: 120 [Urine:40; Drains:80]  Labs:  Recent Labs  01/26/13 0600  WBC 18.9*  HGB 9.1*  PLT 379  CREATININE 0.57   Estimated Creatinine Clearance: 51.2 ml/min (by C-G formula based on Cr of 0.57). No results found for this basename: VANCOTROUGH, Leodis Binet, VANCORANDOM, GENTTROUGH, GENTPEAK, GENTRANDOM, TOBRATROUGH, TOBRAPEAK, TOBRARND, AMIKACINPEAK, AMIKACINTROU, AMIKACIN,  in the last 72 hours   Microbiology: Recent Results (from the past 720 hour(s))  SURGICAL PCR SCREEN     Status: None   Collection Time    01/25/13 10:31 AM      Result Value Range Status   MRSA, PCR NEGATIVE  NEGATIVE Final   Staphylococcus aureus NEGATIVE  NEGATIVE Final   Comment:            The Xpert SA Assay (FDA     approved for NASAL specimens     in patients over 35 years of age),     is one component of     a comprehensive surveillance     program.  Test performance has     been validated by [employer] for patients greater     than or equal to 36 year old.     It is not intended     to diagnose infection nor to     guide or monitor treatment.    Medical History: Past Medical History  Diagnosis Date  . Mixed hyperlipidemia   .  Hypothyroidism   . GERD (gastroesophageal reflux disease)   . Atrial fibrillation     Remote amiodarone use, recurred in 07/2011 post op from knee arthroplasty  . Obese   . History of blood transfusion 08/31/2011    A+ type  . History of GI bleed     Gastritis, was on Xarelto and ASA  . Carpal tunnel syndrome   . Arthritis     Knees  . DJD (degenerative joint disease), thoracolumbar   . Rheumatoid arthritis   . H/O bronchitis   . Hemorrhoids   . Measles     as child  . Mumps     as child  . Carotid artery disease   . PONV (postoperative nausea and vomiting)   . Anemia   . Difficulty sleeping   . Multiple bruises     DUE TO FALL 01/24/13    Medications:  Scheduled:  . ALPRAZolam  0.5-1 mg Oral BID  . [START ON 01/29/2013] aspirin EC  325 mg Oral Q breakfast  . [START ON 01/29/2013] dexamethasone  10 mg Oral Daily   Or  . [START ON 01/29/2013] dexamethasone  10 mg Intravenous Daily  . docusate sodium  100 mg Oral BID  . ferrous sulfate  325 mg Oral BID  . [START ON  01/29/2013] fesoterodine  8 mg Oral Daily  . gabapentin  300 mg Oral BID  . HYDROmorphone      . HYDROmorphone      . [START ON 01/29/2013] levothyroxine  150 mcg Oral QAC breakfast  . NON FORMULARY 40 mg  40 mg Oral Daily  . [START ON 01/29/2013] simvastatin  20 mg Oral q morning - 10a  . [START ON 01/29/2013] vancomycin  500 mg Intravenous Q12H   Infusions:  . sodium chloride     PRN: acetaminophen, acetaminophen, bisacodyl, diphenhydrAMINE, HYDROcodone-acetaminophen, HYDROmorphone (DILAUDID) injection, menthol-cetylpyridinium, methocarbamol (ROBAXIN) IV, methocarbamol, methocarbamol, metoCLOPramide (REGLAN) injection, metoCLOPramide, ondansetron (ZOFRAN) IV, ondansetron, ondansetron, phenol, polyethylene glycol, promethazine, sodium phosphate Assessment: 79 yo F with revision done of knee. Septic arthritis of knee.  Goal of Therapy:  Vancomycin trough level 15-20 mcg/ml  Plan:  Patient had Vancomycin 1000mg   prior to surgery. To follow with Vanc per pharmacy post-op. Vancomycin 500mg  IV q 12 hours. Measure antibiotic drug levels at steady state Follow up culture results  Loletta Specter 01/28/2013,9:40 PM

## 2013-01-28 NOTE — Plan of Care (Signed)
Problem: Consults Goal: Diagnosis- Total Joint Replacement Revision Total Knee     

## 2013-01-28 NOTE — Anesthesia Preprocedure Evaluation (Signed)
Anesthesia Evaluation  Patient identified by MRN, date of birth, ID band Patient awake  General Assessment Comment:Advanced degenerative disc disease with diffuse disc narrowing. Disc bulges are notable at C4-5 and C5-6 at least contacting and, likely compressing, the cervical cord. No gross canal hematoma.   Reviewed: Allergy & Precautions, H&P , NPO status , Patient's Chart, lab work & pertinent test results  History of Anesthesia Complications (+) PONV  Airway Mallampati: II TM Distance: >3 FB Neck ROM: Limited    Dental no notable dental hx.    Pulmonary neg pulmonary ROS,  breath sounds clear to auscultation  Pulmonary exam normal       Cardiovascular hypertension, Pt. on medications Rhythm:Regular Rate:Normal     Neuro/Psych negative neurological ROS  negative psych ROS   GI/Hepatic negative GI ROS, Neg liver ROS,   Endo/Other  Hypothyroidism   Renal/GU negative Renal ROS  negative genitourinary   Musculoskeletal  (+) Arthritis -, Rheumatoid disorders,    Abdominal   Peds negative pediatric ROS (+)  Hematology negative hematology ROS (+)   Anesthesia Other Findings   Reproductive/Obstetrics negative OB ROS                           Anesthesia Physical Anesthesia Plan  ASA: III  Anesthesia Plan: General   Post-op Pain Management:    Induction: Intravenous  Airway Management Planned: LMA  Additional Equipment:   Intra-op Plan:   Post-operative Plan:   Informed Consent: I have reviewed the patients History and Physical, chart, labs and discussed the procedure including the risks, benefits and alternatives for the proposed anesthesia with the patient or authorized representative who has indicated his/her understanding and acceptance.   Dental advisory given  Plan Discussed with: CRNA and Surgeon  Anesthesia Plan Comments:         Anesthesia Quick Evaluation

## 2013-01-28 NOTE — Brief Op Note (Signed)
01/28/2013  7:00 PM  PATIENT:  [name] [name]  79 y.o. female  PRE-OPERATIVE DIAGNOSIS:  RIGHT KNEE INFECTION   POST-OPERATIVE DIAGNOSIS:  RIGHT KNEE INFECTION   PROCEDURE:  Procedure(s): INCISION AND DRAINAGE RIGHT KNEE  RESECTION ARTHROPLASTY ANTIBIOTIC CEMENT SPACER    (Right)  SURGEON:  Surgeon(s) and Role:    * Loanne Drilling, MD - Primary  PHYSICIAN ASSISTANT:   ASSISTANTS: Avel Peace, PA-C   ANESTHESIA:   general  EBL:  Total I/O In: 1000 [I.V.:1000] Out: 100 [Urine:100]  BLOOD ADMINISTERED:none  DRAINS: (Medium) Hemovact drain(s) in the right knee with  Suction Open   LOCAL MEDICATIONS USED:  NONE  COUNTS:  YES  TOURNIQUET:   Total Tourniquet Time Documented: Thigh (Right) - 59 minutes Total: Thigh (Right) - 59 minutes   DICTATION: .Other Dictation: Dictation Number [id number]  PLAN OF CARE: Admit to inpatient   PATIENT DISPOSITION:  PACU - hemodynamically stable.

## 2013-01-28 NOTE — Preoperative (Signed)
Beta Blockers   Reason not to administer Beta Blockers:Not Applicable, not on home BB 

## 2013-01-28 NOTE — Transfer of Care (Signed)
Immediate Anesthesia Transfer of Care Note  Patient: [name] [name]  Procedure(s) Performed: Procedure(s): INCISION AND DRAINAGE RIGHT KNEE  RESECTION ARTHROPLASTY ANTIBIOTIC CEMENT SPACER    (Right)  Patient Location: PACU  Anesthesia Type:General  Level of Consciousness: awake and confused  Airway & Oxygen Therapy: Patient Spontanous Breathing and Patient connected to face mask oxygen  Post-op Assessment: Report given to PACU RN and Post -op Vital signs reviewed and stable  Post vital signs: Reviewed and stable  Complications: No apparent anesthesia complications

## 2013-01-28 NOTE — Progress Notes (Signed)
Patient had bad fall in home on Friday night.

## 2013-01-28 NOTE — Anesthesia Postprocedure Evaluation (Signed)
  Anesthesia Post-op Note  Patient: [name] [name]  Procedure(s) Performed: Procedure(s) (LRB): INCISION AND DRAINAGE RIGHT KNEE  RESECTION ARTHROPLASTY ANTIBIOTIC CEMENT SPACER    (Right)  Patient Location: PACU  Anesthesia Type: General  Level of Consciousness: awake and alert   Airway and Oxygen Therapy: Patient Spontanous Breathing  Post-op Pain: mild  Post-op Assessment: Post-op Vital signs reviewed, Patient's Cardiovascular Status Stable, Respiratory Function Stable, Patent Airway and No signs of Nausea or vomiting  Last Vitals:  Filed Vitals:   01/28/13 2028  BP:   Pulse:   Temp: 36.6 C  Resp:     Post-op Vital Signs: stable   Complications: No apparent anesthesia complications

## 2013-01-29 ENCOUNTER — Encounter (HOSPITAL_COMMUNITY): Payer: Self-pay | Admitting: Orthopedic Surgery

## 2013-01-29 DIAGNOSIS — T8450XA Infection and inflammatory reaction due to unspecified internal joint prosthesis, initial encounter: Principal | ICD-10-CM

## 2013-01-29 DIAGNOSIS — B9689 Other specified bacterial agents as the cause of diseases classified elsewhere: Secondary | ICD-10-CM

## 2013-01-29 DIAGNOSIS — Z89529 Acquired absence of unspecified knee: Secondary | ICD-10-CM | POA: Diagnosis not present

## 2013-01-29 LAB — BASIC METABOLIC PANEL
BUN: 9 mg/dL (ref 6–23)
CO2: 24 mEq/L (ref 19–32)
Chloride: 94 mEq/L — ABNORMAL LOW (ref 96–112)
Creatinine, Ser: 0.61 mg/dL (ref 0.50–1.10)
GFR calc Af Amer: >90 mL/min (ref >90)

## 2013-01-29 LAB — CBC
HCT: 22.7 % — ABNORMAL LOW (ref 36.0–46.0)
MCHC: 32.6 g/dL (ref 30.0–36.0)
MCV: 82.8 fL (ref 78.0–100.0)
RBC: 2.74 MIL/uL — ABNORMAL LOW (ref 3.87–5.11)
RDW: 17.3 % — ABNORMAL HIGH (ref 11.5–15.5)
WBC: 41.6 10*3/uL — ABNORMAL HIGH (ref 4.0–10.5)

## 2013-01-29 LAB — PREPARE RBC (CROSSMATCH)

## 2013-01-29 MED ORDER — LIP MEDEX EX OINT
TOPICAL_OINTMENT | CUTANEOUS | Status: AC
  Administered 2013-01-29: 1
  Filled 2013-01-29: qty 7

## 2013-01-29 MED ORDER — DEXTROSE 5 % IV SOLN
1.0000 g | Freq: Three times a day (TID) | INTRAVENOUS | Status: DC
  Administered 2013-01-29 – 2013-01-30 (×3): 1 g via INTRAVENOUS
  Filled 2013-01-29 (×5): qty 1

## 2013-01-29 MED ORDER — ACETAMINOPHEN 325 MG PO TABS
650.0000 mg | ORAL_TABLET | Freq: Once | ORAL | Status: AC
  Administered 2013-01-29: 650 mg via ORAL
  Filled 2013-01-29: qty 2

## 2013-01-29 MED ORDER — FUROSEMIDE 10 MG/ML IJ SOLN
20.0000 mg | Freq: Once | INTRAMUSCULAR | Status: AC
  Administered 2013-01-29: 15:00:00 20 mg via INTRAVENOUS
  Filled 2013-01-29: qty 2

## 2013-01-29 MED ORDER — SODIUM CHLORIDE 0.9 % IJ SOLN
10.0000 mL | INTRAMUSCULAR | Status: DC | PRN
  Administered 2013-01-30 – 2013-03-05 (×21): 10 mL

## 2013-01-29 MED ORDER — POLYSACCHARIDE IRON COMPLEX 150 MG PO CAPS: 150.0000 mg | ORAL_CAPSULE | Freq: Every day | ORAL | Status: DC

## 2013-01-29 NOTE — Evaluation (Addendum)
Physical Therapy Evaluation Patient Details Name: [name] [name] MRN: [id number] DOB: 1930/11/20 Today's Date: 01/29/2013 Time: 4098-1191 PT Time Calculation (min): 18 min  PT Assessment / Plan / Recommendation History of Present Illness  79 yo female s/p R knee I&D, resection, antibiotic spacer placed. Recent hx of fall (11/1) resulting in multiple cervical fractures requiring Aspen collar at all times.   Clinical Impression  On eval, pt required +2 assist for mobility-able to ambulate ~7 feet with walker. Limited by pain, fatigue. Recommend SNF at this time.     PT Assessment  Patient needs continued PT services    Follow Up Recommendations  SNF (if agreeable. If not, then will need HHPT with 24 hour supervision)    Does the patient have the potential to tolerate intense rehabilitation      Barriers to Discharge        Equipment Recommendations  Rolling walker with 5" wheels    Recommendations for Other Services OT consult   Frequency Min 5X/week    Precautions / Restrictions Precautions Precautions: Fall;Cervical Required Braces or Orthoses: Cervical Brace;Knee Immobilizer - Right Knee Immobilizer - Right: On at all times; NO ROM R knee Cervical Brace: Hard collar;At all times Restrictions Weight Bearing Restrictions: No RLE Weight Bearing: Weight bearing as tolerated   Pertinent Vitals/Pain 5/10 R LE with activity.       Mobility  Bed Mobility Bed Mobility: Supine to Sit Supine to Sit: 1: +2 Total assist;HOB elevated;With rails Supine to Sit: Patient Percentage: 70% Details for Bed Mobility Assistance: Assist for R LE and trunk. Increased time.  Transfers Transfers: Sit to Stand;Stand to Sit Sit to Stand: 1: +2 Total assist;From bed;From elevated surface Sit to Stand: Patient Percentage: 60% Stand to Sit: 1: +2 Total assist;To chair/3-in-1;With armrests Stand to Sit: Patient Percentage: 60% Details for Transfer Assistance: Assist to rise, stabilize, control  descent. VCS safety, technique, hand placement.  Ambulation/Gait Ambulation/Gait Assistance: 1: +2 Total assist Ambulation/Gait: Patient Percentage: 70% Ambulation Distance (Feet): 7 Feet Assistive device: Rolling walker Ambulation/Gait Assistance Details: Assist to stabilize/support pt, maneuver with RW. VCs safety, sequence, technqiue, posture. Followed with recliner.  Gait Pattern: Step-to pattern;Decreased stride length;Trunk flexed;Antalgic;Decreased weight shift to right;Decreased step length - right    Exercises     PT Diagnosis: Difficulty walking;Abnormality of gait;Generalized weakness;Acute pain  PT Problem List: Decreased strength;Decreased range of motion;Decreased activity tolerance;Decreased balance;Decreased mobility;Pain;Decreased knowledge of precautions;Decreased knowledge of use of DME PT Treatment Interventions: DME instruction;Gait training;Functional mobility training;Therapeutic activities;Therapeutic exercise;Patient/family education;Balance training     PT Goals(Current goals can be found in the care plan section) Acute Rehab PT Goals Patient Stated Goal: regain independence PT Goal Formulation: With patient Time For Goal Achievement: 02/12/13 Potential to Achieve Goals: Good  Visit Information  Last PT Received On: 01/29/13 Assistance Needed: +2 (safety) History of Present Illness: 79 yo female s/p R knee I&D, resection, antibiotic spacer placed. Recent hx of fall (11/1) resulting in multiple cervical fractures requiring Aspen collar at all times.        Prior Functioning  Home Living Family/patient expects to be discharged to:: Skilled nursing facility Living Arrangements: Alone Prior Function Level of Independence: Independent with assistive device(s) Communication Communication: No difficulties    Cognition  Cognition Arousal/Alertness: Awake/alert Behavior During Therapy: WFL for tasks assessed/performed Overall Cognitive Status: Within  Functional Limits for tasks assessed    Extremity/Trunk Assessment Upper Extremity Assessment Upper Extremity Assessment: Defer to OT evaluation Lower Extremity Assessment Lower Extremity Assessment: RLE deficits/detail  RLE Deficits / Details: hip flex 2/5, hip abd/add 2/5.  RLE: Unable to fully assess due to immobilization Cervical / Trunk Assessment Cervical / Trunk Assessment: Kyphotic   Balance    End of Session PT - End of Session Equipment Utilized During Treatment: Cervical collar;Right knee immobilizer Activity Tolerance: Patient limited by pain;Patient limited by fatigue Patient left: in chair;with call bell/phone within reach Nurse Communication: Mobility status  GP     [name], MPT Pager: [phone]

## 2013-01-29 NOTE — Progress Notes (Signed)
Peripherally Inserted Central Catheter/Midline Placement  The IV Nurse has discussed with the patient and/or persons authorized to consent for the patient, the purpose of this procedure and the potential benefits and risks involved with this procedure.  The benefits include less needle sticks, lab draws from the catheter and patient may be discharged home with the catheter.  Risks include, but not limited to, infection, bleeding, blood clot (thrombus formation), and puncture of an artery; nerve damage and irregular heat beat.  Alternatives to this procedure were also discussed.  PICC/Midline Placement Documentation        [name] [name] 01/29/2013, 12:51 PM

## 2013-01-29 NOTE — Progress Notes (Signed)
OT Cancellation Note  Patient Details Name: [name] [name] MRN: [id number] DOB: 06/08/1930   Cancelled Treatment:    Reason Eval/Treat Not Completed: Other (comment). Pt currently sleeping and plan is for PICC later.  Will check back or see tomorrow, depending on schedule.    Traylon Schimming 01/29/2013, 11:37 AM Marica Otter, OTR/L [phone] 01/29/2013

## 2013-01-29 NOTE — Progress Notes (Signed)
ANTIBIOTIC CONSULT NOTE - INITIAL  Pharmacy Consult for Cefepime, Vancomycin Indication: Prosthetic Joint Infection  Allergies  Allergen Reactions  . Omeprazole Nausea And Vomiting  . Penicillins Itching  . Morphine Rash    And hot flashes  . Xarelto [Rivaroxaban] Other (See Comments)    Bleeding,spitting up blood    Patient Measurements: Height: 5' 5.35" (166 cm) (Documented in chart 01/25/13) Weight: 149 lb 14.6 oz (68 kg) (Documented in chart 01/25/13) IBW/kg (Calculated) : 57.81  Vital Signs: Temp: 98.6 F (37 C) (11/04 1445) Temp src: Oral (11/04 1445) BP: 134/64 mmHg (11/04 1445) Pulse Rate: 83 (11/04 1445) Intake/Output from previous day: 11/03 0701 - 11/04 0700 In: 3163.8 [P.O.:330; I.V.:2833.8] Out: 806 [Urine:515; Drains:291] Intake/Output from this shift: Total I/O In: 12.5 [Blood:12.5] Out: -   Labs:  Recent Labs  01/29/13 0445  WBC 41.6*  HGB 7.4*  PLT 360  CREATININE 0.61   Estimated Creatinine Clearance: 51.2 ml/min (by C-G formula based on Cr of 0.61). No results found for this basename: VANCOTROUGH, Leodis Binet, VANCORANDOM, GENTTROUGH, GENTPEAK, GENTRANDOM, TOBRATROUGH, TOBRAPEAK, TOBRARND, AMIKACINPEAK, AMIKACINTROU, AMIKACIN,  in the last 72 hours   Microbiology: Recent Results (from the past 720 hour(s))  SURGICAL PCR SCREEN     Status: None   Collection Time    01/25/13 10:31 AM      Result Value Range Status   MRSA, PCR NEGATIVE  NEGATIVE Final   Staphylococcus aureus NEGATIVE  NEGATIVE Final   Comment:            The Xpert SA Assay (FDA     approved for NASAL specimens     in patients over 77 years of age),     is one component of     a comprehensive surveillance     program.  Test performance has     been validated by [employer] for patients greater     than or equal to 90 year old.     It is not intended     to diagnose infection nor to     guide or monitor treatment.  WOUND CULTURE     Status: None   Collection Time     01/28/13  6:02 PM      Result Value Range Status   Specimen Description WOUND RIGHT KNEE   Final   Special Requests PATIENT ON FOLLOWING VANCOMYACIN   Final   Gram Stain     Final   Value: MODERATE WBC PRESENT, PREDOMINANTLY PMN     NO SQUAMOUS EPITHELIAL CELLS SEEN     ABUNDANT GRAM POSITIVE COCCI     IN PAIRS FEW GRAM NEGATIVE RODS     Performed at [facility]   Culture PENDING   Incomplete   Report Status PENDING   Incomplete  ANAEROBIC CULTURE     Status: None   Collection Time    01/28/13  6:02 PM      Result Value Range Status   Specimen Description WOUND RIGHT KNEE   Final   Special Requests PATIENT ON FOLLOWING VANCOMYCIN   Final   Gram Stain     Final   Value: MODERATE WBC PRESENT, PREDOMINANTLY PMN     NO SQUAMOUS EPITHELIAL CELLS SEEN     ABUNDANT GRAM POSITIVE COCCI     IN PAIRS FEW GRAM NEGATIVE RODS     Performed at [facility]   Culture     Final   Value: NO ANAEROBES ISOLATED;  CULTURE IN PROGRESS FOR 5 DAYS     Performed at [facility]   Report Status PENDING   Incomplete    Medical History: Past Medical History  Diagnosis Date  . Mixed hyperlipidemia   . Hypothyroidism   . GERD (gastroesophageal reflux disease)   . Atrial fibrillation     Remote amiodarone use, recurred in 07/2011 post op from knee arthroplasty  . Obese   . History of blood transfusion 08/31/2011    A+ type  . History of GI bleed     Gastritis, was on Xarelto and ASA  . Carpal tunnel syndrome   . Arthritis     Knees  . DJD (degenerative joint disease), thoracolumbar   . Rheumatoid arthritis   . H/O bronchitis   . Hemorrhoids   . Measles     as child  . Mumps     as child  . Carotid artery disease   . PONV (postoperative nausea and vomiting)   . Anemia   . Difficulty sleeping   . Multiple bruises     DUE TO FALL 01/24/13   Assessment: 79 yoF who underwent right TKA in July now admitted with septic arthritis of knee s/p resection of R TKR and  placement of antibiotic spacer 11/3.  Patient was started on vancomycin and ID has added cefepime due to culture results.  Noted allergy to penicillins (itching) however patient has received cephalosporins multiple times in [facility] with no documented reactions.    Tmax:97.9 WBCs: 41.6 Renal: Scr = 0.61 for CrCL 51.2 ml/min (CG), 67ml/min (N, using Scr rounded up to 0.8 for age)  11/3: intra-op wound cx: Abundant GPC in pairs, few GNRs 11/3: anaerboic wound cx: Abundant GPC in pairs, few GNRs  Goal of Therapy:  Vancomycin trough level 15-20 mcg/ml  Plan:  1.  Cefepime 1 gram IV q 8 hours 2.  Continue Vancomycin 500mg  IV q 12 hours 3.  Consider checking vancomycin trough tomorrow in AM 4.  F/u renal function, WBCs, Tm24h, and microbiology  Haynes Hoehn, PharmD 01/29/2013, 3:03 PM  Pager: [phone]

## 2013-01-29 NOTE — Progress Notes (Signed)
Subjective: 1 Day Post-Op Procedure(s) (LRB): INCISION AND DRAINAGE RIGHT KNEE  RESECTION ARTHROPLASTY ANTIBIOTIC CEMENT SPACER    (Right) Patient reports pain as moderate.   Patient seen in rounds with Dr. Lequita Halt.  Encourage PO pain meds but watch for changes in mentation. Patient is having problems with pain in the knee, requiring pain medications We will start therapy today. She had a resection and placement of antibiotic cement spacer so no mobile joint.  No motion or bending of the knee.  Knee immobilizer at all times. I.D. Consult today.  Cultures are positive.  Ordered PICC line today. Plan is to go Skilled nursing facility after hospital stay.  Objective: Vital signs in last 24 hours: Temp:  [97.2 F (36.2 C)-99.6 F (37.6 C)] 97.2 F (36.2 C) (11/04 0917) Pulse Rate:  [83-116] 100 (11/04 0917) Resp:  [15-24] 16 (11/04 0917) BP: (129-188)/(70-110) 148/70 mmHg (11/04 0917) SpO2:  [95 %-100 %] 100 % (11/04 0917) Weight:  [68 kg (149 lb 14.6 oz)] 68 kg (149 lb 14.6 oz) (11/03 2100)  Intake/Output from previous day:  Intake/Output Summary (Last 24 hours) at 01/29/13 1003 Last data filed at 01/29/13 0644  Gross per 24 hour  Intake 3163.75 ml  Output    806 ml  Net 2357.75 ml    Intake/Output this shift: UOP 375 since MN +2357  Labs:  Recent Labs  01/29/13 0445  HGB 7.4*    Recent Labs  01/29/13 0445  WBC 41.6*  RBC 2.74*  HCT 22.7*  PLT 360    Recent Labs  01/29/13 0445  NA 127*  K 4.2  CL 94*  CO2 24  BUN 9  CREATININE 0.61  GLUCOSE 193*  CALCIUM 8.6   No results found for this basename: LABPT, INR,  in the last 72 hours  EXAM General - Patient is Alert and Appropriate Extremity - Neurovascular intact Sensation intact distally Dorsiflexion/Plantar flexion intact Dressing - dressing C/D/I Motor Function - intact, moving foot and toes well on exam.  Hemovacs are left in place. Do not remove.  Past Medical History  Diagnosis Date  .  Mixed hyperlipidemia   . Hypothyroidism   . GERD (gastroesophageal reflux disease)   . Atrial fibrillation     Remote amiodarone use, recurred in 07/2011 post op from knee arthroplasty  . Obese   . History of blood transfusion 08/31/2011    A+ type  . History of GI bleed     Gastritis, was on Xarelto and ASA  . Carpal tunnel syndrome   . Arthritis     Knees  . DJD (degenerative joint disease), thoracolumbar   . Rheumatoid arthritis   . H/O bronchitis   . Hemorrhoids   . Measles     as child  . Mumps     as child  . Carotid artery disease   . PONV (postoperative nausea and vomiting)   . Anemia   . Difficulty sleeping   . 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   Weight as of this encounter: 68 kg (149 lb 14.6 oz). Advance diet Up with therapy I.D. Consult PICC line Blood today and will give lasix between - Positive volume and hyponatremic.  KVO fluids after blood unless pressure issues or nauseated.  DVT Prophylaxis - Aspirin 325 mg daily Weight-Bearing as tolerated to right leg but No Bending or No Motion D/C O2 and Pulse OX and try on Room Air  I.D. Consult Information: Right Total Knee Replacement - 10/08/2012 for OA of the right knee.  Postop complicated by swelling and hematoma requiring I&D and Evacuation of Hematoma Right Knee - 10/11/2012 during  the same hospitalization. Of note, she did have a preop UTI in July that was treated preoperatively with oral Cipro.  Urine culture proved positive for Enterococcus species that was sensitive to ciprofloxacin. She went to [facility] and was followed postoperatively. She developed postop temps and wound drainage which did improve initially by her three week check up. By five weeks, an area opened up and drained again and was treated with oral antibiotics and then placed on wet to dry dressings for the small wound area. By nine weeks, the area had not improved and actually became larger causing her to require surgery.  Sed rate was 45 and CRP was 22.3, both high on 11/27/2012. Repeat I&D Right Knee with [facility] Placement - 12/12/2012 She was seen on a regular basis in the office and had regular VAC changes with home health and had continued improvement of the wound and wound shrinkage was noted with each visit. Due to improvement, the VAC was discontinue on 01/15/2013. Unfortunately, once the [facility] was discontinued and she was started on wet to dry dressing changes, the wound started to worsen and deepen. She was referred to the [facility] in [place], [place] near her home.  She was seen at the wound care and developed bloody, purulent drainage. She was seen back in the office with a worsening wound dehiscence and was scheduled for repeat I&D of the right knee wound infection and resection.  Surgery yesterday. Intraoperative Cultures: MODERATE WBC PRESENT, PREDOMINANTLY PMN NO SQUAMOUS EPITHELIAL CELLS SEEN ABUNDANT GRAM POSITIVE COCCI IN PAIRS FEW GRAM NEGATIVE RODS  PERKINS, ALEXZANDREW 01/29/2013, 10:03 AM

## 2013-01-29 NOTE — Op Note (Signed)
[name] [name], [name] [name]                 ACCOUNT NO.:  [id number]  MEDICAL RECORD NO.:  [id number]  LOCATION:  1609                         FACILITY:  [facility]  PHYSICIAN:  Ollen Gross, M.D.    DATE OF BIRTH:  [birth year]  DATE OF PROCEDURE:  01/28/2013 DATE OF DISCHARGE:                              OPERATIVE REPORT   PREOPERATIVE DIAGNOSIS:  Right knee septic arthritis.  POSTOPERATIVE DIAGNOSIS:  Right knee septic arthritis.  PROCEDURE:  Right knee resection arthroplasty, antibiotic cement spacer placement.  SURGEON:  Ollen Gross, MD  ASSISTANT:  Alexzandrew L. Perkins, PA-C  ANESTHESIA:  General.  ESTIMATED BLOOD LOSS:  Minimal.  DRAINS:  Hemovac x1.  TOURNIQUET TIME:  59 minutes at 300 mmHg.  COMPLICATIONS:  None.  CONDITION:  Stable to recovery.  BRIEF CLINICAL NOTE:  [name] [name] is an 79 year old female with a complex history in regard to her right knee.  She has had a wound necrosis and then just had wound breakdown after removal of the VAC.  She is draining from her knee and presents now for I and D, first resection arthroplasty, antibiotic spacer placement.  PROCEDURE IN DETAIL:  After successful administration of general anesthetic, a tourniquet was placed high on her right thigh, right lower extremity, prepped and draped in usual sterile fashion.  Extremity was wrapped in Esmarch, knee flexed, tourniquet inflated to 300 mmHg.  She had a quarter-sized open area overlying the patella tendon and the visible portion of the tendon was necrotic.  Fresh 10 blade was used to make the incision starting superiorly coursing down to the open area ellipsing the edges of the skin in the open area and then coursing distally to tibial tubercle.  There is a fair amount of subcutaneous tissue necrosis and this was debrided.  There is some purulent fluid, this was sent for Gram stain, C and S.  The fresh blade was used to make a medial arthrotomy.  There is lot of fibrinous debris  in the joint, but no gross pus.  Thorough synovectomy was performed until normal-appearing tissue.  I was then able to sublux the patella laterally and flexed the knee.  The tibial polyethylene was removed.  There was some fibrinous debris posteriorly and this was removed with the knee in flexion.  The tibial component was easily removed.  An oscillating saw was then used to remove the cement from the proximal tibia, and then osteotome was used to remove the cement in the tibial canal.  Osteotome was used to disrupt the interface between the femoral component and bone which was then removed and the cement removed and the femur also.  I then thoroughly irrigated the joint with 4 L of saline using pulsatile lavage.  Cement spacer was mixed on the back table with 3 batches of HV cement mixed with 3 g of vancomycin.  As the space was hardening, I irrigated with another L of saline with pulsatile lavage.  All abnormal tissue of both subcu and joint was removed.  The spacer was then placed and allowed to harden the joint.  We then closed the extensor mechanism over Hemovac drain with a #1 V-Loc suture as well as  with interrupted #1 PDS sutures to double close it.  Subcu was then closed over Hemovac drain with interrupted 2-0 Vicryl.  The previously open area even after ellipsed the skin incision, I was able to advance the skin edges together without any undue tension.  The 3 nylon sutures were placed to reduce tension and those are even extremely tight.  The skin was then closed with staples.  Tourniquet released at total time of 59 minutes. There was minimal bleeding encountered.  The drains hooked to suction. Incision cleaned and dried and a bulky sterile dressing applied.  She was placed into a knee immobilizer, awakened, and transferred to recovery in stable condition.  Please note the surgical assistant was a medical necessity in this procedure in order to protect the vital ligaments and  neurovascular structures during removal of the prosthesis as well as to allow for proper retraction for adequate debridement of these tissues.     Ollen Gross, M.D.     FA/MEDQ  D:  01/28/2013  T:  01/29/2013  Job:  [id number]

## 2013-01-29 NOTE — Consult Note (Addendum)
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 female who originally underwent right TKA in July 14th c/b post-op hematoma requiring I x D on POD#3, It was later complicated by wound drainage < 1 month post op. She had developed open wound that had local wound care plus oral antibiotics and eventually had I x D #2 on 9/17 with wound vac placement at that time. She had some improvement to the point where wound vac removed on 10/21, and returned to wet- to dry dressing changes. While at SNF and followed at wound care center, wound started to have worsening dehiscence with purulent sanginous drainage.[name] She was admitted on 11/1 for sustaining a ground level fall after falling off commode and sustaining left temporal abrasian, requiring sutures and also had neumerous cervical fractures and likely ligamentous injury now in c-collar. Since her right knee wound dehiscence was progressed, Dr. Lequita Halt also thought she would benefit from I X D of suspected infected right TKA. Based upon OR findings, she underwent total resection of infected prosthesis and had antibiotic spacer placement on 11/3 in anticipation for 2 stage revision. OR cx gram stain show GPC pairs and few GNR. She was started on vancomycin. Cultures pending. She remains afebrile but has had a leukamoid reaction post-operatively with wbc going from 18.9 to 41.6.K.  Trauma series xrays showed:  Right C6 articular  process fracture, nondisplaced.  2. C5 spinous process and right lamina fractures, nondisplaced.  3. Question anteroinferior corner/osteophyte fractures at C5 and C6.  4. Moderate anterior perivertebral edema/hemorrhage at C6 and C7.  Suspect an MR which show ligamentous injury, and upstage the  cervical spine injury.  5. Degenerative disc disease with spinal canal stenosis and cord  compression at C4-5 and C5-6.  6. No acute intracranial findings. Age appropriate senescent  changes.  7. Negative for facial fracture.    Past Medical History  Diagnosis Date  . Mixed hyperlipidemia   . Hypothyroidism   . GERD (gastroesophageal reflux disease)   . Atrial fibrillation     Remote amiodarone use, recurred in 07/2011 post op from knee arthroplasty  . Obese   . History of blood transfusion 08/31/2011    A+ type  . History of GI bleed     Gastritis, was on Xarelto and ASA  . Carpal tunnel syndrome   . Arthritis     Knees  . DJD (degenerative joint disease), thoracolumbar   . Rheumatoid arthritis   . H/O bronchitis   . Hemorrhoids   . Measles     as child  . Mumps     as child  . Carotid artery disease   . PONV (postoperative nausea and vomiting)   . Anemia   . Difficulty sleeping   . Multiple bruises     DUE TO FALL 01/24/13    Allergies:  Allergies  Allergen Reactions  . Omeprazole Nausea And Vomiting  . Penicillins Itching  . Morphine Rash    And hot flashes  . Xarelto [Rivaroxaban] Other (See Comments)    Bleeding,spitting up blood   MEDICATIONS: . ALPRAZolam  0.5 mg Oral Daily   And  . ALPRAZolam  1 mg Oral QHS  . aspirin EC  325 mg Oral Q breakfast  . docusate sodium  100 mg Oral BID  . esomeprazole  40 mg Oral Daily  . ferrous sulfate  325 mg Oral BID  . fesoterodine  8 mg Oral Daily  . gabapentin  300 mg Oral BID  . levothyroxine  150 mcg Oral QAC breakfast  . simvastatin  20 mg Oral q morning - 10a  . vancomycin  500 mg Intravenous Q12H    History    Substance Use Topics  . Smoking status: Never Smoker   . Smokeless tobacco: Never Used  . Alcohol Use: No    Family History  Problem Relation Age of Onset  . Cancer    . Stroke    . Diabetes    . Hypertension    . Colon cancer Neg Hx   . Colon polyps Neg Hx   . Rectal cancer Neg Hx   . Stomach cancer Neg Hx     Review of Systems  Constitutional: Negative for fever, chills, diaphoresis, activity change, appetite change, fatigue and unexpected weight change.  HENT: Negative for congestion, sore throat, rhinorrhea, sneezing, trouble swallowing and sinus pressure.  Eyes: Negative for photophobia and visual disturbance.  Respiratory: Negative for cough, chest tightness, shortness of breath, wheezing and stridor.  Cardiovascular: Negative for chest pain, palpitations and leg swelling.  Gastrointestinal: Negative for nausea, vomiting, abdominal pain, diarrhea, constipation, blood in stool, abdominal distention and anal bleeding.  Genitourinary: Negative for dysuria, hematuria, flank pain and difficulty urinating.  Musculoskeletal: Negative for myalgias, back pain, joint swelling, arthralgias and gait problem.  Skin: Negative for color change, pallor, rash and wound.  Neurological: Negative for dizziness, tremors, weakness and light-headedness.  Hematological: Negative for adenopathy. Does not bruise/bleed easily.  Psychiatric/Behavioral: Negative for behavioral problems, confusion, sleep disturbance, dysphoric mood, decreased concentration and agitation.     OBJECTIVE: Temp:  [97.2 F (36.2 C)-99.6 F (37.6 C)] 98.3 F (36.8 C) (11/04 1045) Pulse Rate:  [83-116] 90 (11/04 1045) Resp:  [15-24] 15 (11/04 1045) BP: (114-188)/(62-110) 114/62 mmHg (11/04 1045) SpO2:  [95 %-100 %] 100 % (11/04 1045) Weight:  [149 lb 14.6 oz (68 kg)] 149 lb 14.6 oz (68 kg) (11/03 2100)  Physical Exam  Constitutional:  oriented to person, place, and time. Numerous facial ecchymosis to left forehead  bilateral eyes, in c-collar. Surprisingly in No distress.  HENT: left forehead, sutures from temporal laceration Mouth/Throat: Oropharynx is clear and moist. No oropharyngeal exudate.  Cardiovascular: Normal rate, regular rhythm and normal heart sounds. Exam reveals no gallop and no friction rub.  No murmur heard.  Pulmonary/Chest: Effort normal and breath sounds normal. No respiratory distress.  no wheezes.  Abdominal: Soft. Bowel sounds are normal.exhibits no distension. There is no tenderness.  Lymphadenopathy: no cervical adenopathy.  Neurological:  alert and oriented to person, place, and time.  Skin: Skin is warm and dry. No rash noted. No erythema.  Ext: right leg wrapped from surgery, accordian drain in place Psychiatric: a normal mood and affect.  behavior is normal.    LABS: Results for orders placed during the hospital encounter of 01/28/13 (from the past 48 hour(s))  TYPE AND SCREEN     Status: None   Collection Time    01/28/13  2:45 PM      Result Value Range   ABO/RH(D) A POS     Antibody Screen NEG     Sample Expiration 01/31/2013     Unit Number [id number]     Blood Component Type RED CELLS,LR     Unit division 00     Status of Unit ALLOCATED     Transfusion Status OK TO TRANSFUSE     Crossmatch Result Compatible     Unit Number [id number]     Blood Component Type RED CELLS,LR     Unit division 00     Status of Unit ISSUED     Transfusion Status OK TO TRANSFUSE     Crossmatch Result Compatible    WOUND CULTURE     Status: None   Collection Time    01/28/13  6:02 PM      Result Value Range   Specimen Description WOUND RIGHT KNEE     Special Requests PATIENT ON FOLLOWING VANCOMYACIN     Gram Stain       Value: MODERATE WBC PRESENT, PREDOMINANTLY PMN     NO SQUAMOUS EPITHELIAL CELLS SEEN     ABUNDANT GRAM POSITIVE COCCI     IN PAIRS FEW GRAM NEGATIVE RODS     Performed at [facility]   Culture PENDING     Report Status PENDING    ANAEROBIC  CULTURE     Status: None   Collection Time    01/28/13  6:02 PM      Result Value Range   Specimen Description WOUND RIGHT KNEE     Special Requests PATIENT ON FOLLOWING VANCOMYCIN     Gram Stain       Value: MODERATE WBC PRESENT, PREDOMINANTLY PMN     NO SQUAMOUS EPITHELIAL CELLS SEEN     ABUNDANT GRAM POSITIVE COCCI     IN PAIRS FEW GRAM NEGATIVE RODS     Performed at [facility]   Culture       Value: NO ANAEROBES ISOLATED; CULTURE IN PROGRESS FOR 5 DAYS     Performed at [facility]   Report Status PENDING    PREPARE RBC (CROSSMATCH)     Status: None   Collection Time    01/28/13  6:30 PM      Result Value Range   Order Confirmation ORDER PROCESSED BY BLOOD BANK    CBC     Status: Abnormal   Collection Time    01/29/13  4:45 AM      Result Value Range   WBC 41.6 (*) 4.0 - 10.5 K/uL   Comment: REPEATED TO VERIFY   RBC 2.74 (*) 3.87 - 5.11 MIL/uL   Hemoglobin 7.4 (*) 12.0 - 15.0 g/dL   HCT 30.8 (*) 65.7 - 84.6 %   MCV 82.8  78.0 - 100.0 fL   MCH 27.0  26.0 - 34.0 pg   MCHC 32.6  30.0 - 36.0 g/dL   RDW 96.2 (*) 95.2 - 84.1 %   Platelets 360  150 - 400 K/uL  BASIC METABOLIC PANEL     Status: Abnormal   Collection Time    01/29/13  4:45 AM      Result Value Range   Sodium 127 (*) 135 - 145 mEq/L   Potassium 4.2  3.5 - 5.1 mEq/L   Chloride 94 (*) 96 - 112 mEq/L  CO2 24  19 - 32 mEq/L   Glucose, Bld 193 (*) 70 - 99 mg/dL   BUN 9  6 - 23 mg/dL   Creatinine, Ser 1.61  0.50 - 1.10 mg/dL   Calcium 8.6  8.4 - 09.6 mg/dL   GFR calc non Af Amer 83 (*) >90 mL/min   GFR calc Af Amer >90  >90 mL/min   Comment: (NOTE)     The eGFR has been calculated using the CKD EPI equation.     This calculation has not been validated in all clinical situations.     eGFR's persistently <90 mL/min signify possible Chronic Kidney     Disease.  PREPARE RBC (CROSSMATCH)     Status: None   Collection Time    01/29/13 10:30 AM      Result Value Range   Order Confirmation  ORDER PROCESSED BY BLOOD BANK      MICRO: 11/3 wound cx: GPC in pairs and few GNR  Assessment/Plan:  79 yo F with TKA in July 2014 c/b post op hematoma s/p I x D who has had poor wound healing and dehiscence. Admitted for management of infected prosthetic joint. POD#1 prosthesis resection and antibiotic spacer placement  - also start cefepime  in addition to vancomycin for  Gram negative coverage given gram stain findings - place picc line in anticipation to receive at least 6 wks of IV antibiotics - await culture results in order to determine final antibiotics recommendations  Arya Luttrull B. Drue Second MD MPH [facility] [phone]

## 2013-01-30 LAB — TYPE AND SCREEN
Antibody Screen: NEGATIVE
Unit division: 0

## 2013-01-30 LAB — BASIC METABOLIC PANEL
BUN: 14 mg/dL (ref 6–23)
Calcium: 9.2 mg/dL (ref 8.4–10.5)
Chloride: 95 mEq/L — ABNORMAL LOW (ref 96–112)
GFR calc Af Amer: >90 mL/min (ref >90)
GFR calc non Af Amer: 80 mL/min — ABNORMAL LOW (ref >90)
Potassium: 4.2 mEq/L (ref 3.5–5.1)

## 2013-01-30 LAB — CBC
HCT: 26.2 % — ABNORMAL LOW (ref 36.0–46.0)
MCHC: 32.8 g/dL (ref 30.0–36.0)
Platelets: 279 10*3/uL (ref 150–400)
RDW: 16.5 % — ABNORMAL HIGH (ref 11.5–15.5)

## 2013-01-30 MED ORDER — DEXTROSE 5 % IV SOLN
2.0000 g | INTRAVENOUS | Status: DC
  Administered 2013-01-30 – 2013-02-19 (×21): 2 g via INTRAVENOUS
  Filled 2013-01-30 (×21): qty 2

## 2013-01-30 NOTE — Progress Notes (Signed)
    [facility]    Date of Admission:  01/28/2013   Total days of antibiotics 3        Day 3 vanco        Day 2 cefepime           ID: [name] [name] is a 79 y.o. female with  GBS wound dehiscence, prosthetic joint POD#2 explant and antibiotic spacer Active Problems:   Essential hypertension, benign   Hyponatremia   Postoperative anemia due to acute blood loss   Postop Transfusion   Surgical wound infection   Leukocytosis   Hypothyroidism   Necrosis of surgical wound   Acquired absence of knee joint following removal of joint prosthesis with presence of antibiotic-impregnated cement spacer    Subjective: afebrile  Medications:  . ALPRAZolam  0.5 mg Oral Daily   And  . ALPRAZolam  1 mg Oral QHS  . aspirin EC  325 mg Oral Q breakfast  . ceFEPime (MAXIPIME) IV  1 g Intravenous Q8H  . docusate sodium  100 mg Oral BID  . esomeprazole  40 mg Oral Daily  . ferrous sulfate  325 mg Oral BID  . fesoterodine  8 mg Oral Daily  . gabapentin  300 mg Oral BID  . levothyroxine  150 mcg Oral QAC breakfast  . simvastatin  20 mg Oral q morning - 10a    Objective: Vital signs in last 24 hours: Temp:  [97.5 F (36.4 C)-98.8 F (37.1 C)] 98.5 F (36.9 C) (11/05 1453) Pulse Rate:  [74-102] 102 (11/05 1453) Resp:  [14-16] 16 (11/05 1453) BP: (135-167)/(66-90) 150/77 mmHg (11/05 1453) SpO2:  [96 %-100 %] 96 % (11/05 1453) FiO2 (%):  [97.5 %] 97.5 % (11/05 0533) Constitutional: oriented to person, place, and time. Numerous facial ecchymosis to left forehead bilateral eyes, in c-collar. Surprisingly in No distress.  HENT: left forehead, sutures from temporal laceration  Mouth/Throat: Oropharynx is clear and moist. No oropharyngeal exudate.  Cardiovascular: Normal rate, regular rhythm and normal heart sounds. Exam reveals no gallop and no friction rub.  No murmur heard.  Pulmonary/Chest: Effort normal and breath sounds normal. No respiratory distress. no wheezes.    Abdominal: Soft. Bowel sounds are normal.exhibits no distension. There is no tenderness.  Lymphadenopathy: no cervical adenopathy.  Neurological: alert and oriented to person, place, and time.  Skin: Skin is warm and dry. No rash noted. No erythema.  Ext: right leg wrapped from surgery, accordian drain in place Psychiatric: a normal mood and affect. behavior is normal.  Lab Results  Recent Labs  01/29/13 0445 01/30/13 0400  WBC 41.6* 35.0*  HGB 7.4* 8.6*  HCT 22.7* 26.2*  NA 127* 129*  K 4.2 4.2  CL 94* 95*  CO2 24 27  BUN 9 14  CREATININE 0.61 0.68    Microbiology: 11/3 wound cx :GBS Studies/Results: No results found.   Assessment/Plan: 79 yo F with TKA in July 2014 c/b post op hematoma s/p I x D who has had poor wound healing and dehiscence. Admitted for management of infected prosthetic joint. POD#2  prosthesis resection and antibiotic spacer placement. OR cx growing group B strep -  Will change antibiotics to ceftriaxone 2gm IV daily - place picc line in anticipation to receive at least 6 wks of IV antibiotics    Barrett Goldie, [facility] Cell: [phone] Pager: [phone]  01/30/2013, 3:22 PM

## 2013-01-30 NOTE — Progress Notes (Signed)
Subjective: 2 Days Post-Op Procedure(s) (LRB): INCISION AND DRAINAGE RIGHT KNEE  RESECTION ARTHROPLASTY ANTIBIOTIC CEMENT SPACER    (Right) Patient reports pain as mild and moderate.   Patient seen in rounds with Dr. Lequita Halt.  Patient is having problems with pain in the knee, requiring pain medications We will start therapy today.  Plan is to go Skilled nursing facility after hospital stay.  Objective: Vital signs in last 24 hours: Temp:  [97.5 F (36.4 C)-98.8 F (37.1 C)] 97.5 F (36.4 C) (11/05 0533) Pulse Rate:  [74-85] 74 (11/05 0533) Resp:  [14] 14 (11/05 0533) BP: (117-167)/(60-90) 135/66 mmHg (11/05 0533) SpO2:  [96 %-100 %] 96 % (11/05 0533) FiO2 (%):  [97.5 %] 97.5 % (11/05 0533)  Intake/Output from previous day:  Intake/Output Summary (Last 24 hours) at 01/30/13 1442 Last data filed at 01/30/13 1112  Gross per 24 hour  Intake 1649.75 ml  Output   2985 ml  Net -1335.25 ml    Intake/Output this shift: Total I/O In: 240 [P.O.:240] Out: 650 [Urine:650]  Labs:  Recent Labs  01/29/13 0445 01/30/13 0400  HGB 7.4* 8.6*    Recent Labs  01/29/13 0445 01/30/13 0400  WBC 41.6* 35.0*  RBC 2.74* 3.14*  HCT 22.7* 26.2*  PLT 360 279    Recent Labs  01/29/13 0445 01/30/13 0400  NA 127* 129*  K 4.2 4.2  CL 94* 95*  CO2 24 27  BUN 9 14  CREATININE 0.61 0.68  GLUCOSE 193* 117*  CALCIUM 8.6 9.2   No results found for this basename: LABPT, INR,  in the last 72 hours  EXAM General - Patient is Alert, Appropriate and Oriented Extremity - Neurovascular intact Sensation intact distally Dorsiflexion/Plantar flexion intact Incision - moderate drainage, able to express out a lot of bloody drainage.  Motor Function - intact, moving foot and toes well on exam.   Hemovacs pulled without difficulty.  Past Medical History  Diagnosis Date  . Mixed hyperlipidemia   . Hypothyroidism   . GERD (gastroesophageal reflux disease)   . Atrial fibrillation    Remote amiodarone use, recurred in 07/2011 post op from knee arthroplasty  . Obese   . History of blood transfusion 08/31/2011    A+ type  . History of GI bleed     Gastritis, was on Xarelto and ASA  . Carpal tunnel syndrome   . Arthritis     Knees  . DJD (degenerative joint disease), thoracolumbar   . Rheumatoid arthritis   . H/O bronchitis   . Hemorrhoids   . Measles     as child  . Mumps     as child  . Carotid artery disease   . PONV (postoperative nausea and vomiting)   . Anemia   . Difficulty sleeping   . Multiple bruises     DUE TO FALL 01/24/13    Assessment/Plan: 2 Days Post-Op Procedure(s) (LRB): INCISION AND DRAINAGE RIGHT KNEE  RESECTION ARTHROPLASTY ANTIBIOTIC CEMENT SPACER    (Right) Active Problems:   Essential hypertension, benign   Hyponatremia   Postoperative anemia due to acute blood loss   Postop Transfusion   Surgical wound infection   Leukocytosis   Hypothyroidism   Necrosis of surgical wound   Acquired absence of knee joint following removal of joint prosthesis with presence of antibiotic-impregnated cement spacer  Estimated body mass index is 24.68 kg/(m^2) as calculated from the following:   Height as of this encounter: 5' 5.35" (1.66 m).   Weight  as of this encounter: 68 kg (149 lb 14.6 oz). Up with therapy Discharge to SNF once improved.  DVT Prophylaxis - Aspirin 325 mg Weight-Bearing as tolerated to right leg but No Bending or No Motion PICC placed. WBC improving. HGB up to 8.6. Na+ up to 129. Knee immobilizer at all times.  Fareedah Mahler 01/30/2013, 2:42 PM

## 2013-01-30 NOTE — Progress Notes (Signed)
ANTIBIOTIC CONSULT NOTE - INITIAL  Pharmacy Consult for Cefepime, Vancomycin Indication: Prosthetic Joint Infection  Allergies  Allergen Reactions  . Omeprazole Nausea And Vomiting  . Penicillins Itching  . Morphine Rash    And hot flashes  . Xarelto [Rivaroxaban] Other (See Comments)    Bleeding,spitting up blood    Patient Measurements: Height: 5' 5.35" (166 cm) (Documented in chart 01/25/13) Weight: 149 lb 14.6 oz (68 kg) (Documented in chart 01/25/13) IBW/kg (Calculated) : 57.81  Vital Signs: Temp: 98.5 F (36.9 C) (11/05 1453) Temp src: Oral (11/05 1453) BP: 150/77 mmHg (11/05 1453) Pulse Rate: 102 (11/05 1453) Intake/Output from previous day: 11/04 0701 - 11/05 0700 In: 2053.9 [P.O.:630; I.V.:619.8; Blood:704.2; IV Piggyback:100] Out: 2335 [Urine:2225; Drains:110] Intake/Output from this shift: Total I/O In: 480 [P.O.:480] Out: 650 [Urine:650]  Labs:  Recent Labs  01/29/13 0445 01/30/13 0400  WBC 41.6* 35.0*  HGB 7.4* 8.6*  PLT 360 279  CREATININE 0.61 0.68   Estimated Creatinine Clearance: 51.2 ml/min (by C-G formula based on Cr of 0.68). No results found for this basename: VANCOTROUGH, Leodis Binet, VANCORANDOM, GENTTROUGH, GENTPEAK, GENTRANDOM, TOBRATROUGH, TOBRAPEAK, TOBRARND, AMIKACINPEAK, AMIKACINTROU, AMIKACIN,  in the last 72 hours   Microbiology: Recent Results (from the past 720 hour(s))  SURGICAL PCR SCREEN     Status: None   Collection Time    01/25/13 10:31 AM      Result Value Range Status   MRSA, PCR NEGATIVE  NEGATIVE Final   Staphylococcus aureus NEGATIVE  NEGATIVE Final   Comment:            The Xpert SA Assay (FDA     approved for NASAL specimens     in patients over 43 years of age),     is one component of     a comprehensive surveillance     program.  Test performance has     been validated by [employer] for patients greater     than or equal to 39 year old.     It is not intended     to diagnose infection nor to   guide or monitor treatment.  WOUND CULTURE     Status: None   Collection Time    01/28/13  6:02 PM      Result Value Range Status   Specimen Description WOUND RIGHT KNEE   Final   Special Requests PATIENT ON FOLLOWING VANCOMYACIN   Final   Gram Stain     Final   Value: MODERATE WBC PRESENT, PREDOMINANTLY PMN     NO SQUAMOUS EPITHELIAL CELLS SEEN     ABUNDANT GRAM POSITIVE COCCI     IN PAIRS FEW GRAM NEGATIVE RODS     Performed at [facility]   Culture     Final   Value: ABUNDANT GROUP B STREP(S.AGALACTIAE)ISOLATED     Note: TESTING AGAINST S. AGALACTIAE NOT ROUTINELY PERFORMED DUE TO PREDICTABILITY OF AMP/PEN/VAN SUSCEPTIBILITY.     Performed at [facility]   Report Status PENDING   Incomplete  ANAEROBIC CULTURE     Status: None   Collection Time    01/28/13  6:02 PM      Result Value Range Status   Specimen Description WOUND RIGHT KNEE   Final   Special Requests PATIENT ON FOLLOWING VANCOMYCIN   Final   Gram Stain     Final   Value: MODERATE WBC PRESENT, PREDOMINANTLY PMN     NO SQUAMOUS EPITHELIAL CELLS SEEN  ABUNDANT GRAM POSITIVE COCCI     IN PAIRS FEW GRAM NEGATIVE RODS     Performed at [facility]   Culture     Final   Value: NO ANAEROBES ISOLATED; CULTURE IN PROGRESS FOR 5 DAYS     Performed at [facility]   Report Status PENDING   Incomplete    Medical History: Past Medical History  Diagnosis Date  . Mixed hyperlipidemia   . Hypothyroidism   . GERD (gastroesophageal reflux disease)   . Atrial fibrillation     Remote amiodarone use, recurred in 07/2011 post op from knee arthroplasty  . Obese   . History of blood transfusion 08/31/2011    A+ type  . History of GI bleed     Gastritis, was on Xarelto and ASA  . Carpal tunnel syndrome   . Arthritis     Knees  . DJD (degenerative joint disease), thoracolumbar   . Rheumatoid arthritis   . H/O bronchitis   . Hemorrhoids   . Measles     as child  . Mumps     as child  .  Carotid artery disease   . PONV (postoperative nausea and vomiting)   . Anemia   . Difficulty sleeping   . Multiple bruises     DUE TO FALL 01/24/13   Assessment: 79 yoF who underwent right TKA in July now admitted with septic arthritis of knee s/p resection of R TKR and placement of antibiotic spacer 11/3.  Patient was started on vancomycin and ID has added cefepime due to culture results.  Noted allergy to penicillins (itching) however patient has received cephalosporins multiple times in [facility] with no documented reactions.    11/03 >> Vancomycin >> 11/4 >> Cefepime >>  Tmax:97.9 WBCs: 35 Renal: Scr = 0.68 for CrCL 51.2 ml/min (CG), 55ml/min (N, using Scr rounded up to 0.8 for age)  11/3: intra-op wound cx: GBS 11/3: anaerboic wound cx: pending  Dose changes/drug level info:  11/6 05:30 vanco trough = ___mcg/ml on vancomycin 500mg  IV Q12h   Goal of Therapy:  Vancomycin trough level 15-20 mcg/ml  Plan:  1.  Cefepime 1 gram IV q 8 hours 2.  Continue Vancomycin 500mg  IV q 12 hours will plan to check tomorrow morning but will watch for ID note and possible antibiotic change per culture results and d/c trough as indicated 4.  F/u renal function, WBCs, Tm24h, and microbiology  Juliette Alcide, PharmD, BCPS.   Pager: [phone] 01/30/2013 3:12 PM

## 2013-01-30 NOTE — Progress Notes (Addendum)
Physical Therapy Treatment Patient Details Name: [name] [name] MRN: [id number] DOB: [birth year] Today's Date: 01/30/2013 Time: 4098-1191 PT Time Calculation (min): 34 min  PT Assessment / Plan / Recommendation  History of Present Illness 79 yo female s/p R knee I&D, resection, antibiotic spacer placed. Recent hx of fall (11/1) resulting in multiple cervical fractures requiring Aspen collar at all times.    PT Comments   Progressing slowly with mobility. Limited by pain, fatigue.   Follow Up Recommendations  SNF     Does the patient have the potential to tolerate intense rehabilitation     Barriers to Discharge        Equipment Recommendations  Rolling walker with 5" wheels    Recommendations for Other Services OT consult  Frequency Min 5X/week   Progress towards PT Goals Progress towards PT goals: Progressing toward goals  Plan Current plan remains appropriate    Precautions / Restrictions Precautions Precautions: Fall;Cervical Precaution Comments: No ROM R knee Required Braces or Orthoses: Cervical Brace;Knee Immobilizer - Right Knee Immobilizer - Right: On at all times Cervical Brace: At all times;Hard collar Restrictions Weight Bearing Restrictions: No RLE Weight Bearing: Weight bearing as tolerated   Pertinent Vitals/Pain R Le 9/10 with activity. Ice applied end of session    Mobility  Bed Mobility Bed Mobility: Supine to Sit Supine to Sit: 1: +2 Total assist Supine to Sit: Patient Percentage: 70% Details for Bed Mobility Assistance: Assist for R LE and trunk. Increased time.  Transfers Transfers: Sit to Stand;Stand to Sit Sit to Stand: 1: +2 Total assist;From bed;From chair/3-in-1 Sit to Stand: Patient Percentage: 70% Stand to Sit: To bed;To chair/3-in-1;1: +2 Total assist Stand to Sit: Patient Percentage: 70% Details for Transfer Assistance: assist to rise, steady, control descent. VCs safety, technique, hand placement Ambulation/Gait Ambulation/Gait  Assistance: 1: +2 Total assist Ambulation/Gait: Patient Percentage: 80% Ambulation Distance (Feet): 15 Feet Assistive device: Rolling walker Ambulation/Gait Assistance Details: Assist to stabilize/support pt, maneuver with walker. VCS safety, sequence, technique, posture. Followed with recliner Gait Pattern: Step-to pattern;Decreased stride length;Trunk flexed;Antalgic;Decreased weight shift to right;Decreased step length - right    Exercises     PT Diagnosis:    PT Problem List:   PT Treatment Interventions:     PT Goals (current goals can now be found in the care plan section) Acute Rehab PT Goals Patient Stated Goal: regain independence and decreased pain  Visit Information  Last PT Received On: 01/30/13 Assistance Needed: +2 History of Present Illness: 79 yo female s/p R knee I&D, resection, antibiotic spacer placed. Recent hx of fall (11/1) resulting in multiple cervical fractures requiring Aspen collar at all times.     Subjective Data  Patient Stated Goal: regain independence and decreased pain   Cognition  Cognition Arousal/Alertness: Awake/[name] Behavior During Therapy: WFL for tasks assessed/performed Overall Cognitive Status: Within Functional Limits for tasks assessed    Balance     End of Session PT - End of Session Equipment Utilized During Treatment: Cervical collar;Right knee immobilizer Activity Tolerance: Patient limited by pain;Patient limited by fatigue Patient left: in chair;with call bell/phone within reach Nurse Communication: Mobility status   GP     [name] [name], MPT Pager: [phone]

## 2013-01-30 NOTE — Evaluation (Signed)
Occupational Therapy Evaluation Patient Details Name: [name] [name] MRN: [id number] DOB: 14-Apr-1930 Today's Date: 01/30/2013 Time: 5784-6962 OT Time Calculation (min): 22 min  OT Assessment / Plan / Recommendation History of present illness 79 yo female s/p R knee I&D, resection, antibiotic spacer placed. Recent hx of fall (11/1) resulting in multiple cervical fractures requiring Aspen collar at all times.    Clinical Impression   Pt was admitted for the above sx.  She will benefit from skilled OT to increase safety and independence with adls.       OT Assessment  Patient needs continued OT Services    Follow Up Recommendations  SNF    Barriers to Discharge      Equipment Recommendations  3 in 1 bedside comode    Recommendations for Other Services    Frequency  Min 2X/week    Precautions / Restrictions Precautions Precautions: Fall;Cervical Precaution Comments: aspen collar; KI AAT, no ROM Required Braces or Orthoses: Cervical Brace;Knee Immobilizer - Right Knee Immobilizer - Right: On at all times Cervical Brace: Hard collar;At all times Restrictions RLE Weight Bearing: Weight bearing as tolerated   Pertinent Vitals/Pain RLE sore--repositioned with ice.      ADL  Grooming: Minimal assistance;Wash/dry hands;Wash/dry face;Teeth care (occasional min A to reach items on tray) Where Assessed - Grooming: Supported sitting Upper Body Bathing: Moderate assistance Where Assessed - Upper Body Bathing: Supported sitting Lower Body Bathing: +2 Total assistance Lower Body Bathing: Patient Percentage: 30% Where Assessed - Lower Body Bathing: Supported sit to stand Upper Body Dressing: Moderate assistance Where Assessed - Upper Body Dressing: Supported sitting Lower Body Dressing: +2 Total assistance Lower Body Dressing: Patient Percentage: 0% Where Assessed - Lower Body Dressing: Supported sit to stand Equipment Used: Knee Immobilizer;Rolling walker Transfers/Ambulation Related  to ADLs: sit to stand only for adls, A x 2 for safety. ADL Comments: Performed adl from recliner.  Pt motivated and does as much as she can.  Steadied her while she washed peri area    OT Diagnosis: Generalized weakness  OT Problem List: Decreased strength;Decreased activity tolerance;Impaired balance (sitting and/or standing);Decreased knowledge of use of DME or AE;Pain;Impaired UE functional use OT Treatment Interventions: Self-care/ADL training;DME and/or AE instruction;Patient/family education;Balance training;Therapeutic activities;Therapeutic exercise   OT Goals(Current goals can be found in the care plan section) Acute Rehab OT Goals Patient Stated Goal: regain independence and decreased pain OT Goal Formulation: With patient Time For Goal Achievement: 02/06/13 Potential to Achieve Goals: Good ADL Goals Pt Will Perform Upper Body Bathing: with min assist;sitting Pt Will Transfer to Toilet: with mod assist;bedside commode;stand pivot transfer Pt Will Perform Toileting - Clothing Manipulation and hygiene: with min assist;sit to/from stand Additional ADL Goal #1: Pt will go sit to stand and maintain for 2 minutes with min A for adls Additional ADL Goal #2: Pt will tolerate 2 sets 10 a/aarom to bil UEs to increase strength and endurance for adls  Visit Information  Last OT Received On: 01/30/13 Assistance Needed: +2 History of Present Illness: 79 yo female s/p R knee I&D, resection, antibiotic spacer placed. Recent hx of fall (11/1) resulting in multiple cervical fractures requiring Aspen collar at all times.        Prior Functioning     Home Living Family/patient expects to be discharged to:: Skilled nursing facility Living Arrangements: Alone Prior Function Level of Independence: Independent with assistive device(s) Communication Communication: No difficulties         Vision/Perception     Cognition  Cognition  Arousal/Alertness: Awake/alert Behavior During  Therapy: WFL for tasks assessed/performed Overall Cognitive Status: Within Functional Limits for tasks assessed    Extremity/Trunk Assessment Upper Extremity Assessment Upper Extremity Assessment:  (bil UEs limited to approximately 80* flexion) Cervical / Trunk Assessment Cervical / Trunk Assessment: Kyphotic     Mobility Transfers Sit to Stand: 1: +2 Total assist;From bed;From elevated surface Sit to Stand: Patient Percentage: 60% Details for Transfer Assistance: assist to rise and steady     Exercise     Balance     End of Session OT - End of Session Activity Tolerance: Patient tolerated treatment well Patient left: in chair;with call bell/phone within reach  GO     [facility] 01/30/2013, 11:47 AM Marica Otter, OTR/L [phone] 01/30/2013

## 2013-01-30 NOTE — Clinical Documentation Improvement (Signed)
  It is noted that an "incision" was made using a 10 blade during procedure  Clarification Needed  Please clarify the following:  The type of "debridement" performed using the "fresh 10 blade," and document in pn or d/c summary    Please clarify documentation in the medical record of "debridement." Please document the below (4) key elements in a progress note:   Depth of debridement/type of tissue removed? (e.g., skin, subcutaneous tissue, fascia, muscle, bone, other)   Did the debridement extend outside or beyond the wound margins?   Excisional vs. nonexcisional?   Irrigation of wound (e.g, Versajet)?   Location of wound?   Document the size of the debridement.  Risk Factors: Right knee resection arthroplasty, antibiotic cement spacer  placement.    Thank You,  Enis Slipper ,RN, BSN, MSN/Inf, CCDS Clinical Documentation Specialist [facility] Dept Pager: [phone] / E-mail: [email or web address]   [phone]  [facility]- Health Information Management

## 2013-01-31 DIAGNOSIS — B951 Streptococcus, group B, as the cause of diseases classified elsewhere: Secondary | ICD-10-CM

## 2013-01-31 LAB — CBC
Hemoglobin: 8.5 g/dL — ABNORMAL LOW (ref 12.0–15.0)
MCH: 27.4 pg (ref 26.0–34.0)
Platelets: 305 10*3/uL (ref 150–400)
RBC: 3.1 MIL/uL — ABNORMAL LOW (ref 3.87–5.11)
WBC: 19 10*3/uL — ABNORMAL HIGH (ref 4.0–10.5)

## 2013-01-31 LAB — WOUND CULTURE

## 2013-01-31 NOTE — Care Management Note (Signed)
    Page 1 of 1   01/31/2013     1:33:51 PM   CARE MANAGEMENT NOTE 01/31/2013  Patient:  LEIGH, BLAS   Account Number:  [id number]  Date Initiated:  01/30/2013  Documentation initiated by:  Colleen Can  Subjective/Objective Assessment:   dx rt knee infection; I&D, resection     Action/Plan:   SNF rehab   Anticipated DC Date:  02/01/2013   Anticipated DC Plan:  SKILLED NURSING FACILITY  In-house referral  Clinical Social Worker      DC Planning Services  CM consult      Choice offered to / List presented to:             Status of service:  Completed, signed off Medicare Important Message given?   (If response is "NO", the following Medicare IM given date fields will be blank) Date Medicare IM given:   Date Additional Medicare IM given:    Discharge Disposition:    Per UR Regulation:  Reviewed for med. necessity/level of care/duration of stay  If discussed at Long Length of Stay Meetings, dates discussed:    Comments:

## 2013-01-31 NOTE — Clinical Documentation Improvement (Signed)
THIS DOCUMENT IS NOT A PERMANENT PART OF THE MEDICAL RECORD  Please update your documentation with the medical record to reflect your response to this query. If you need help knowing how to do this please call [phone].  01/31/13   Dear Dr. Lequita Halt / Associates,  In a better effort to capture your patient's severity of illness, reflect appropriate length of stay and utilization of resources, a review of the patient medical record has revealed the following indicators.    Based on your clinical judgment, please clarify and document in a progress note and/or discharge summary the clinical condition associated with the following supporting information:  In responding to this query please exercise your independent judgment.  The fact that a query is asked, does not imply that any particular answer is desired or expected. Because that is documentation in the medical record of "debridement" clarification is needed.   It is noted that an "incision" was made using a 10 blade during procedure  Clarification Needed  Please clarify the following:  The type of "debridement" performed using the "fresh 10 blade," and document in pn or d/c summary    Please clarify documentation in the medical record of "debridement."   Please document the below (4) key elements in a progress note:   Excisional vs. Nonexcisional?   Depth of debridement   Did the debridement extend outside or beyond the wound margins?   Document the size of the debridement.  Risk Factors: Right knee resection arthroplasty, antibiotic cement spacer  placement.     Please document the below (4) key elements in a progress note:  1.  Type of Debridement:          Excisional Debridement-  Cutting away necrotic, devitalized tissue or slough to  Level of viable tissue using a sharp instrument (example, scalpel, scissors, etc).           NON Excisional Debridement-  The removal of necrotic, devitalized tissue or slough by  means of scraping, mechanical brushing, flushing, or washing. (example: irrigation, whirlpool);  Minor removal of loose fragments   You may use possible, probable, or suspect with inpatient documentation. possible, probable, suspected diagnoses MUST be documented at the time of discharge  Reviewed:  no additional documentation provided  Thank You,  Enis Slipper  RN, BSN, MSN/Inf, CCDS Clinical Documentation Specialist [facility] Dept Pager: [phone] / E-mail: [email or web address]  [phone] Health Information Management [place]

## 2013-01-31 NOTE — Progress Notes (Signed)
Physical Therapy Treatment Patient Details Name: [name] [name] MRN: [id number] DOB: 11/30/1930 Today's Date: 01/31/2013 Time: 4098-1191 PT Time Calculation (min): 28 min  PT Assessment / Plan / Recommendation  History of Present Illness     PT Comments   Pt continues to progress with all mobility goals as was able to increase ambulation distance during this session.  Continues to be very motivated to perform as well as she can and is ready to D./C to [facility] tomorrow.    Follow Up Recommendations  SNF     Does the patient have the potential to tolerate intense rehabilitation     Barriers to Discharge        Equipment Recommendations  Rolling walker with 5" wheels    Recommendations for Other Services    Frequency Min 5X/week   Progress towards PT Goals Progress towards PT goals: Progressing toward goals  Plan Current plan remains appropriate    Precautions / Restrictions Precautions Precautions: Fall;Cervical Precaution Comments: aspen collar; KI AAT, no ROM Required Braces or Orthoses: Cervical Brace;Knee Immobilizer - Right Knee Immobilizer - Right: On at all times Cervical Brace: At all times;Hard collar Restrictions Weight Bearing Restrictions: No RLE Weight Bearing: Weight bearing as tolerated   Pertinent Vitals/Pain "As bad as it can get"  But had received pain meds prior to session and agreeable to work with therapy.     Mobility  Bed Mobility Bed Mobility: Supine to Sit Supine to Sit: 4: Min assist;HOB elevated;With rails Details for Bed Mobility Assistance: Assist for RLE out of bed with mod cues for using handrails to self assist with trunk elevation.  Transfers Transfers: Sit to Stand;Stand to Sit Sit to Stand: 4: Min assist;From elevated surface;With upper extremity assist;From bed Stand to Sit: 4: Min assist;With upper extremity assist;With armrests;To chair/3-in-1 Details for Transfer Assistance: Assist to rise, steady and ensure controlled descent  with cues for proper hand placement, RLE extension once in standing and flexion in order to sit with KI donned.  Ambulation/Gait Ambulation/Gait Assistance: 4: Min assist Ambulation Distance (Feet): 20 Feet Assistive device: Rolling walker Ambulation/Gait Assistance Details: Provided assist to steady throughout with cues for sequencing/technique with RW, increasing BUE WB to decrease antalgic gait pattern, and maintaining upright posture throughout.  Gait Pattern: Step-to pattern;Decreased stride length;Trunk flexed;Antalgic;Decreased weight shift to right;Decreased step length - right    Exercises     PT Diagnosis:    PT Problem List:   PT Treatment Interventions:     PT Goals (current goals can now be found in the care plan section) Acute Rehab PT Goals Patient Stated Goal: regain independence and decreased pain PT Goal Formulation: With patient Time For Goal Achievement: 02/12/13 Potential to Achieve Goals: Good  Visit Information  Last PT Received On: 01/31/13    Subjective Data  Subjective: I'm hurting, but I want to try to do as much as I can.  Patient Stated Goal: regain independence and decreased pain   Cognition  Cognition Arousal/Alertness: Awake/alert Behavior During Therapy: WFL for tasks assessed/performed Overall Cognitive Status: Within Functional Limits for tasks assessed    Balance     End of Session PT - End of Session Equipment Utilized During Treatment: Cervical collar;Right knee immobilizer;Gait belt Activity Tolerance: Patient limited by pain;Patient limited by fatigue Patient left: in chair;with call bell/phone within reach Nurse Communication: Mobility status   GP     Vista Deck 01/31/2013, 10:15 AM

## 2013-01-31 NOTE — Progress Notes (Signed)
[facility]    Date of Admission:  01/28/2013   Total days of antibiotics 4        Day 2 ceftriaxone                   ID: [name] [name] is a 79 y.o. 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 ALPRAZolam  0.5 mg Oral Daily   And  . ALPRAZolam  1 mg Oral QHS  . aspirin EC  325 mg Oral Q breakfast  . cefTRIAXone (ROCEPHIN)  IV  2 g Intravenous Q24H  . docusate sodium  100 mg Oral BID  . esomeprazole  40 mg Oral Daily  . ferrous sulfate  325 mg Oral BID  . fesoterodine  8 mg Oral Daily  . gabapentin  300 mg Oral BID  . levothyroxine  150 mcg Oral QAC breakfast  . simvastatin  20 mg Oral q morning - 10a    Objective: Vital signs in last 24 hours: Temp:  [98.3 F (36.8 C)-98.6 F (37 C)] 98.3 F (36.8 C) (11/06 1437) Pulse Rate:  [80-102] 88 (11/06 1437) Resp:  [15-16] 16 (11/06 1437) BP: (144-168)/(66-83) 144/66 mmHg (11/06 1437) SpO2:  [90 %-100 %] 100 % (11/06 1437) Constitutional: oriented to person, place, and time. Numerous facial ecchymosis to left forehead bilateral eyes, in c-collar. Surprisingly in No distress.  HENT: left forehead, sutures from temporal laceration  Mouth/Throat: Oropharynx is clear and moist. No oropharyngeal exudate.  Cardiovascular: Normal rate, regular rhythm and normal heart sounds. Exam reveals no gallop and no friction rub.  No murmur heard.  Pulmonary/Chest: Effort normal and breath sounds normal. No respiratory distress. no wheezes.  Abdominal: Soft. Bowel  sounds are normal.exhibits no distension. There is no tenderness.  Lymphadenopathy: no cervical adenopathy.  Neurological: alert and oriented to person, place, and time.  Skin: Skin is warm and dry. No rash noted. No erythema.  Ext: right leg wrapped from surgery, accordian drain in place Psychiatric: a normal mood and affect. behavior is normal.  Lab Results  Recent Labs  01/29/13 0445 01/30/13 0400 01/31/13 0610  WBC 41.6* 35.0* 19.0*  HGB 7.4* 8.6* 8.5*  HCT 22.7* 26.2* 26.4*  NA 127* 129*  --   K 4.2 4.2  --   CL 94* 95*  --   CO2 24 27  --   BUN 9 14  --   CREATININE 0.61 0.68  --     Microbiology: 11/3 wound cx :GBS Studies/Results: No results found.   Assessment/Plan: 79 yo F with TKA in July 2014 c/b post op hematoma s/p I x D who has had poor wound healing and dehiscence. Admitted for management of group  b streptococcal infected prosthetic joint. POD#3   prosthesis resection and antibiotic spacer placement.  -  Continue with ceftriaxone 2gm IV daily for 6 wks  -  Have SNF check weekly cbc and cmp to ensure no side effects from long term antibiotics - we will have her come back to [facility] for  follow up in 2-4wks   [facility], [facility] for Infectious Diseases Cell: [phone] Pager: [phone]  01/31/2013, 2:52 PM

## 2013-01-31 NOTE — Progress Notes (Signed)
Subjective: 3 Days Post-Op Procedure(s) (LRB): INCISION AND DRAINAGE RIGHT KNEE  RESECTION ARTHROPLASTY ANTIBIOTIC CEMENT SPACER    (Right) Patient reports pain as mild and moderate.   Patient seen in rounds with Dr. Lequita Halt. She is more alert today but still hurting with regards to her knee Patient is well, but has had some minor complaints of pain in the knee, requiring pain medications Plan is to go Skilled nursing facility after hospital stay.  Objective: Vital signs in last 24 hours: Temp:  [98.5 F (36.9 C)-98.6 F (37 C)] 98.6 F (37 C) (11/06 0520) Pulse Rate:  [80-102] 80 (11/06 0520) Resp:  [16] 16 (11/06 0520) BP: (148-168)/(77-83) 168/82 mmHg (11/06 0520) SpO2:  [90 %-96 %] 90 % (11/06 0520)  Intake/Output from previous day:  Intake/Output Summary (Last 24 hours) at 01/31/13 0851 Last data filed at 01/31/13 0522  Gross per 24 hour  Intake 1188.33 ml  Output   3100 ml  Net -1911.67 ml    Labs:  Recent Labs  01/29/13 0445 01/30/13 0400 01/31/13 0610  HGB 7.4* 8.6* 8.5*    Recent Labs  01/30/13 0400 01/31/13 0610  WBC 35.0* 19.0*  RBC 3.14* 3.10*  HCT 26.2* 26.4*  PLT 279 305    Recent Labs  01/29/13 0445 01/30/13 0400  NA 127* 129*  K 4.2 4.2  CL 94* 95*  CO2 24 27  BUN 9 14  CREATININE 0.61 0.68  GLUCOSE 193* 117*  CALCIUM 8.6 9.2   No results found for this basename: LABPT, INR,  in the last 72 hours  EXAM General - Patient is Alert and Appropriate Extremity - Neurovascular intact Sensation intact distally Dorsiflexion/Plantar flexion intact Dressing/Incision - clean, staples intact, no active bleeding, drainage noted no dressing Motor Function - intact, moving foot and toes well on exam.   Past Medical History  Diagnosis Date  . Mixed hyperlipidemia   . Hypothyroidism   . GERD (gastroesophageal reflux disease)   . Atrial fibrillation     Remote amiodarone use, recurred in 07/2011 post op from knee arthroplasty  . Obese   .  History of blood transfusion 08/31/2011    A+ type  . History of GI bleed     Gastritis, was on Xarelto and ASA  . Carpal tunnel syndrome   . Arthritis     Knees  . DJD (degenerative joint disease), thoracolumbar   . Rheumatoid arthritis   . H/O bronchitis   . Hemorrhoids   . Measles     as child  . Mumps     as child  . Carotid artery disease   . PONV (postoperative nausea and vomiting)   . Anemia   . Difficulty sleeping   . 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   Weight as of this encounter: 68 kg (149 lb 14.6 oz). Up with therapy  Plan for discharge tomorrow Discharge to SNF  DVT Prophylaxis - Aspirin 325 ng Weight-Bearing as tolerated to right leg but No Bending or No Motion WBC improved - down to 19.0 Na+ stable at 129 HGB stable at 8.5 Dr. Drue Second changed antibiotics to ceftriaxone 2gm IV daily PICC line for probably 6 weeks of IV antibiotic therapy.  Culture  ABUNDANT GROUP B STREP(S.AGALACTIAE)ISOLATED    [name] [name] 01/31/2013, 8:51 AM

## 2013-02-01 LAB — CBC
Hemoglobin: 8.9 g/dL — ABNORMAL LOW (ref 12.0–15.0)
MCH: 28.4 pg (ref 26.0–34.0)
MCHC: 32.6 g/dL (ref 30.0–36.0)
MCV: 87.2 fL (ref 78.0–100.0)
Platelets: 395 10*3/uL (ref 150–400)
RBC: 3.13 MIL/uL — ABNORMAL LOW (ref 3.87–5.11)
WBC: 25 10*3/uL — ABNORMAL HIGH (ref 4.0–10.5)

## 2013-02-01 LAB — BASIC METABOLIC PANEL
CO2: 28 mEq/L (ref 19–32)
Calcium: 8.9 mg/dL (ref 8.4–10.5)
Chloride: 94 mEq/L — ABNORMAL LOW (ref 96–112)
Glucose, Bld: 109 mg/dL — ABNORMAL HIGH (ref 70–99)
Potassium: 4.5 mEq/L (ref 3.5–5.1)
Sodium: 129 mEq/L — ABNORMAL LOW (ref 135–145)

## 2013-02-01 NOTE — Progress Notes (Signed)
Physical Therapy Treatment Patient Details Name: [name] [name] MRN: [id number] DOB: 08/01/1930 Today's Date: 02/01/2013 Time: 0820-0858 PT Time Calculation (min): 38 min  PT Assessment / Plan / Recommendation  History of Present Illness 79 yo female s/p R knee I&D, resection, antibiotic spacer placed. Recent hx of fall (11/1) resulting in multiple cervical fractures requiring Aspen collar at all times.    PT Comments   *Pt progressing well with mobility, she walked 51' today. Performed RLE exercises with assist. Pt puts forth good effort and is motivated to progress.**  Follow Up Recommendations  SNF     Does the patient have the potential to tolerate intense rehabilitation     Barriers to Discharge        Equipment Recommendations  Rolling walker with 5" wheels    Recommendations for Other Services    Frequency Min 5X/week   Progress towards PT Goals Progress towards PT goals: Progressing toward goals  Plan Current plan remains appropriate    Precautions / Restrictions Precautions Precautions: Fall;Cervical Precaution Comments: aspen collar; KI AAT, no ROM Required Braces or Orthoses: Cervical Brace;Knee Immobilizer - Right Knee Immobilizer - Right: On at all times Cervical Brace: At all times;Hard collar Restrictions Weight Bearing Restrictions: No RLE Weight Bearing: Weight bearing as tolerated   Pertinent Vitals/Pain *10/10 RLE Premedicated, ice applied**    Mobility  Bed Mobility Bed Mobility: Supine to Sit Supine to Sit: HOB elevated;With rails;3: Mod assist Details for Bed Mobility Assistance: Assist for RLE out of bed with mod cues for using handrails to self assist with trunk elevation.  Transfers Transfers: Sit to Stand;Stand to Sit Sit to Stand: 4: Min assist;From elevated surface;With upper extremity assist;From bed;From chair/3-in-1 Stand to Sit: 4: Min assist;With upper extremity assist;With armrests;To chair/3-in-1 Details for Transfer Assistance:  Assist to rise, steady and ensure controlled descent with cues for proper hand placement, RLE extension once in standing and flexion in order to sit with KI donned.  Ambulation/Gait Ambulation/Gait Assistance: 4: Min assist Ambulation/Gait: Patient Percentage: 90% Ambulation Distance (Feet): 50 Feet Assistive device: Rolling walker Gait Pattern: Step-to pattern;Decreased stride length;Trunk flexed;Antalgic;Decreased weight shift to right;Decreased step length - right Gait velocity: decreased General Gait Details: VCs to lift head, steady, no LOB, increased time    Exercises Total Joint Exercises Ankle Circles/Pumps: AROM;Both;10 reps;Supine Quad Sets: AROM;Both;10 reps;Supine Hip ABduction/ADduction: AAROM;Right;10 reps;Supine Straight Leg Raises: AAROM;Right;10 reps;Supine   PT Diagnosis:    PT Problem List:   PT Treatment Interventions:     PT Goals (current goals can now be found in the care plan section) Acute Rehab PT Goals Patient Stated Goal: regain independence and decreased pain PT Goal Formulation: With patient Time For Goal Achievement: 02/12/13 Potential to Achieve Goals: Good  Visit Information  Last PT Received On: 02/01/13 Assistance Needed: +1 History of Present Illness: 79 yo female s/p R knee I&D, resection, antibiotic spacer placed. Recent hx of fall (11/1) resulting in multiple cervical fractures requiring Aspen collar at all times.     Subjective Data  Patient Stated Goal: regain independence and decreased pain   Cognition  Cognition Arousal/Alertness: Awake/alert Behavior During Therapy: WFL for tasks assessed/performed Overall Cognitive Status: Within Functional Limits for tasks assessed    Balance     End of Session PT - End of Session Equipment Utilized During Treatment: Cervical collar;Right knee immobilizer;Gait belt Activity Tolerance: Patient limited by pain;Patient limited by fatigue Patient left: in chair;with call bell/phone within  reach Nurse Communication: Mobility status  GP     Ralene Bathe Kistler 02/01/2013, 10:01 AM [phone]

## 2013-02-01 NOTE — Progress Notes (Signed)
Subjective: 4 Days Post-Op Procedure(s) (LRB): INCISION AND DRAINAGE RIGHT KNEE  RESECTION ARTHROPLASTY ANTIBIOTIC CEMENT SPACER    (Right) Patient reports pain as mild and moderate.   Patient seen in rounds with Dr. Lequita Halt.  She looks better today than preop/ Patient is well, but has had some minor complaints of pain in the knee, requiring pain medications Plan is to go Skilled nursing facility after hospital stay.  Objective: Vital signs in last 24 hours: Temp:  [98.1 F (36.7 C)-98.6 F (37 C)] 98.6 F (37 C) (11/07 1355) Pulse Rate:  [75-87] 87 (11/07 1355) Resp:  [16-18] 18 (11/07 1355) BP: (132-166)/(73-79) 132/79 mmHg (11/07 1355) SpO2:  [97 %-99 %] 99 % (11/07 1355)  Intake/Output from previous day:  Intake/Output Summary (Last 24 hours) at 02/01/13 1728 Last data filed at 02/01/13 1217  Gross per 24 hour  Intake    851 ml  Output   2300 ml  Net  -1449 ml    Intake/Output this shift: Total I/O In: 371 [P.O.:240; I.V.:131] Out: 1075 [Urine:1075]  Labs:  Recent Labs  01/30/13 0400 01/31/13 0610 02/01/13 0940  HGB 8.6* 8.5* 8.9*    Recent Labs  01/31/13 0610 02/01/13 0940  WBC 19.0* 25.0*  RBC 3.10* 3.13*  HCT 26.4* 27.3*  PLT 305 395    Recent Labs  01/30/13 0400 02/01/13 0940  NA 129* 129*  K 4.2 4.5  CL 95* 94*  CO2 27 28  BUN 14 12  CREATININE 0.68 0.74  GLUCOSE 117* 109*  CALCIUM 9.2 8.9   No results found for this basename: LABPT, INR,  in the last 72 hours  EXAM General - Patient is Alert and Appropriate Extremity - Neurovascular intact Sensation intact distally Dressing/Incision - no drainage, no active bleeding, still with swelling. Motor Function - intact, moving foot and toes well on exam.   Past Medical History  Diagnosis Date  . Mixed hyperlipidemia   . Hypothyroidism   . GERD (gastroesophageal reflux disease)   . Atrial fibrillation     Remote amiodarone use, recurred in 07/2011 post op from knee arthroplasty  .  Obese   . History of blood transfusion 08/31/2011    A+ type  . History of GI bleed     Gastritis, was on Xarelto and ASA  . Carpal tunnel syndrome   . Arthritis     Knees  . DJD (degenerative joint disease), thoracolumbar   . Rheumatoid arthritis   . H/O bronchitis   . Hemorrhoids   . Measles     as child  . Mumps     as child  . Carotid artery disease   . PONV (postoperative nausea and vomiting)   . Anemia   . Difficulty sleeping   . 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   Weight as of this encounter: 68 kg (149  lb 14.6 oz).  DVT Prophylaxis - Aspirin 325 ng  Weight-Bearing as tolerated to right leg but No Bending or No Motion  WBC back up to 25 Na+ stable at 129  HGB stable at 8.9  Continue with ceftriaxone 2gm IV daily  PICC line for probably 6 weeks of IV antibiotic therapy.  Culture  ABUNDANT GROUP B STREP(S.AGALACTIAE)ISOLATED    PERKINS, ALEXZANDREW 02/01/2013, 5:28 PM

## 2013-02-01 NOTE — Progress Notes (Signed)
[facility]    Date of Admission:  01/28/2013   Total days of antibiotics 4        Day 2 ceftriaxone                   ID: [name] [name] is a 79 y.o. female with  GBS wound dehiscence, prosthetic joint POD#3 explant and antibiotic spacer Active Problems:   Essential hypertension, benign   Hyponatremia   Postoperative anemia due to acute blood loss   Postop Transfusion   Surgical wound infection   Leukocytosis   Hypothyroidism   Necrosis of surgical wound   Acquired absence of knee joint following removal of joint prosthesis with presence of antibiotic-impregnated cement spacer    Subjective: Afebrile, but having knee pain. Sore from being in neck brace  Medications:  . ALPRAZolam  0.5 mg Oral Daily   And  . ALPRAZolam  1 mg Oral QHS  . aspirin EC  325 mg Oral Q breakfast  . cefTRIAXone (ROCEPHIN)  IV  2 g Intravenous Q24H  . docusate sodium  100 mg Oral BID  . esomeprazole  40 mg Oral Daily  . ferrous sulfate  325 mg Oral BID  . fesoterodine  8 mg Oral Daily  . gabapentin  300 mg Oral BID  . levothyroxine  150 mcg Oral QAC breakfast  . simvastatin  20 mg Oral q morning - 10a    Objective: Vital signs in last 24 hours: Temp:  [98.1 F (36.7 C)-98.6 F (37 C)] 98.6 F (37 C) (11/07 1355) Pulse Rate:  [75-87] 87 (11/07 1355) Resp:  [16-18] 18 (11/07 1355) BP: (132-166)/(73-79) 132/79 mmHg (11/07 1355) SpO2:  [97 %-99 %] 99 % (11/07 1355) Constitutional: oriented to person, place, and time. Numerous facial ecchymosis to left forehead bilateral eyes, in c-collar. Surprisingly in No distress.  HENT: left forehead, sutures from temporal laceration  Mouth/Throat: Oropharynx is clear and moist. No oropharyngeal exudate.  Cardiovascular: Normal rate, regular rhythm and normal heart sounds. Exam reveals no gallop and no friction rub.  No murmur heard.  Pulmonary/Chest: Effort normal and breath sounds normal. No respiratory distress. no  wheezes.  Abdominal: Soft. Bowel sounds are normal.exhibits no distension. There is no tenderness.  Lymphadenopathy: no cervical adenopathy.  Neurological: alert and oriented to person, place, and time.  Skin: Skin is warm and dry. No rash noted. No erythema.  Ext: right leg wrapped from surgery, accordian drain in place Psychiatric: a normal mood and affect. behavior is normal.  Lab Results  Recent Labs  01/30/13 0400 01/31/13 0610 02/01/13 0940  WBC 35.0* 19.0* 25.0*  HGB 8.6* 8.5* 8.9*  HCT 26.2* 26.4* 27.3*  NA 129*  --  129*  K 4.2  --  4.5  CL 95*  --  94*  CO2 27  --  28  BUN 14  --  12  CREATININE 0.68  --  0.74    Microbiology: 11/3 wound cx :GBS Studies/Results: No results found.   Assessment/Plan: 79 yo F with TKA in July 2014 c/b post op hematoma s/p I x D who has had poor wound healing and dehiscence. Admitted for management of group  b streptococcal infected prosthetic joint. POD#3   prosthesis resection and antibiotic spacer placement.  -  Continue with ceftriaxone 2gm IV daily for 6 wks   -  If she discharges over the weekend, please have SNF check weekly cbc and cmp to ensure no side effects  from long term antibiotics - we will have her come back to [facility] for follow up in 2-4wks   [facility], [facility] Cell: [phone] Pager: [phone]  02/01/2013, 4:13 PM

## 2013-02-01 NOTE — Progress Notes (Signed)
CSW met with pt this am to assist with d/c planning. NSG reports pt is not stable for d/c. [facility] has been contacted and SNF bed will still be available on Monday if pt is stable for d/c. Pt has been update.  Cori Razor LCSW [phone]

## 2013-02-02 LAB — CBC
HCT: 24.5 % — ABNORMAL LOW (ref 36.0–46.0)
MCH: 27.4 pg (ref 26.0–34.0)
MCV: 87.2 fL (ref 78.0–100.0)
RBC: 2.81 MIL/uL — ABNORMAL LOW (ref 3.87–5.11)
RDW: 17.3 % — ABNORMAL HIGH (ref 11.5–15.5)
WBC: 24.1 10*3/uL — ABNORMAL HIGH (ref 4.0–10.5)

## 2013-02-02 LAB — BASIC METABOLIC PANEL
CO2: 28 mEq/L (ref 19–32)
Calcium: 8.9 mg/dL (ref 8.4–10.5)
Chloride: 94 mEq/L — ABNORMAL LOW (ref 96–112)
Creatinine, Ser: 0.64 mg/dL (ref 0.50–1.10)
GFR calc Af Amer: >90 mL/min (ref >90)
Glucose, Bld: 84 mg/dL (ref 70–99)
Potassium: 4 mEq/L (ref 3.5–5.1)

## 2013-02-02 NOTE — Progress Notes (Signed)
   Subjective: 5 Days Post-Op Procedure(s) (LRB): INCISION AND DRAINAGE RIGHT KNEE  RESECTION ARTHROPLASTY ANTIBIOTIC CEMENT SPACER    (Right) Patient reports pain as moderate.   Patient is still feeling weak Plan is to go Skilled nursing facility after hospital stay.  Objective: Vital signs in last 24 hours: Temp:  [98.6 F (37 C)-98.9 F (37.2 C)] 98.6 F (37 C) (11/08 0620) Pulse Rate:  [85-88] 85 (11/08 0620) Resp:  [18] 18 (11/08 0620) BP: (132-155)/(70-79) 143/70 mmHg (11/08 0620) SpO2:  [97 %-99 %] 98 % (11/08 0620)  Intake/Output from previous day:  Intake/Output Summary (Last 24 hours) at 02/02/13 0820 Last data filed at 02/02/13 0803  Gross per 24 hour  Intake    491 ml  Output   2225 ml  Net  -1734 ml    Intake/Output this shift: Total I/O In: 360 [P.O.:360] Out: -   Labs:  Recent Labs  01/31/13 0610 02/01/13 0940 02/02/13 0358  HGB 8.5* 8.9* 7.7*    Recent Labs  02/01/13 0940 02/02/13 0358  WBC 25.0* 24.1*  RBC 3.13* 2.81*  HCT 27.3* 24.5*  PLT 395 366    Recent Labs  02/01/13 0940 02/02/13 0358  NA 129* 130*  K 4.5 4.0  CL 94* 94*  CO2 28 28  BUN 12 11  CREATININE 0.74 0.64  GLUCOSE 109* 84  CALCIUM 8.9 8.9   No results found for this basename: LABPT, INR,  in the last 72 hours  EXAM General - Patient is Alert, Appropriate and Oriented Extremity - Neurologically intact Neurovascular intact No cellulitis present Compartment soft decreased bloody drainage from knee Motor Function - intact, moving foot and toes well on exam.    Past Medical History  Diagnosis Date  . Mixed hyperlipidemia   . Hypothyroidism   . GERD (gastroesophageal reflux disease)   . Atrial fibrillation     Remote amiodarone use, recurred in 07/2011 post op from knee arthroplasty  . Obese   . History of blood transfusion 08/31/2011    A+ type  . History of GI bleed     Gastritis, was on Xarelto and ASA  . Carpal tunnel syndrome   . Arthritis    Knees  . DJD (degenerative joint disease), thoracolumbar   . Rheumatoid arthritis   . H/O bronchitis   . Hemorrhoids   . Measles     as child  . Mumps     as child  . Carotid artery disease   . PONV (postoperative nausea and vomiting)   . Anemia   . Difficulty sleeping   . Multiple bruises     DUE TO FALL 01/24/13    Assessment/Plan: 5 Days Post-Op Procedure(s) (LRB): INCISION AND DRAINAGE RIGHT KNEE  RESECTION ARTHROPLASTY ANTIBIOTIC CEMENT SPACER    (Right) Active Problems:   Necrosis of surgical wound   Essential hypertension, benign   Hyponatremia   Postoperative anemia due to acute blood loss   Postop Transfusion   Surgical wound infection   Leukocytosis   Hypothyroidism   Acquired absence of knee joint following removal of joint prosthesis with presence of antibiotic-impregnated cement spacer  Will transfuse 2 units PRBCs today due to symptomatic post-op anemia.  DVT Prophylaxis - Aspirin Weight-Bearing as tolerated to right leg  Drew Herman V 02/02/2013, 8:20 AM

## 2013-02-03 LAB — BASIC METABOLIC PANEL
GFR calc Af Amer: >90 mL/min (ref >90)
GFR calc non Af Amer: 80 mL/min — ABNORMAL LOW (ref >90)
Potassium: 3.9 mEq/L (ref 3.5–5.1)
Sodium: 131 mEq/L — ABNORMAL LOW (ref 135–145)

## 2013-02-03 LAB — CBC
Hemoglobin: 10.3 g/dL — ABNORMAL LOW (ref 12.0–15.0)
RBC: 3.65 MIL/uL — ABNORMAL LOW (ref 3.87–5.11)
WBC: 22.1 10*3/uL — ABNORMAL HIGH (ref 4.0–10.5)

## 2013-02-03 LAB — TYPE AND SCREEN
ABO/RH(D): A POS
Antibody Screen: NEGATIVE
Unit division: 0

## 2013-02-03 NOTE — Progress Notes (Signed)
Physical Therapy Treatment Patient Details Name: [name] [name] MRN: [id number] DOB: 1930/07/24 Today's Date: 02/03/2013 Time: 1440-1455 PT Time Calculation (min): 15 min  PT Assessment / Plan / Recommendation  History of Present Illness 79 yo female s/p R knee I&D, resection, antibiotic spacer placed. Recent hx of fall (11/1) resulting in multiple cervical fractures requiring Aspen collar at all times.    PT Comments   Pt ltd this date by pain.  Assisted from [facility] to bed with RW and +2 assist.  RN aware and providing pain meds  Follow Up Recommendations  SNF     Does the patient have the potential to tolerate intense rehabilitation     Barriers to Discharge        Equipment Recommendations  Rolling walker with 5" wheels    Recommendations for Other Services OT consult  Frequency Min 5X/week   Progress towards PT Goals Progress towards PT goals: Progressing toward goals  Plan Current plan remains appropriate    Precautions / Restrictions Precautions Precautions: Fall;Cervical Precaution Comments: aspen collar; KI AAT, no ROM Required Braces or Orthoses: Cervical Brace;Knee Immobilizer - Right Knee Immobilizer - Right: On at all times Cervical Brace: At all times;Hard collar Restrictions Weight Bearing Restrictions: No RLE Weight Bearing: Weight bearing as tolerated   Pertinent Vitals/Pain 7/10; Meds received during session    Mobility  Bed Mobility Bed Mobility: Sit to Supine Sit to Supine: 1: +2 Total assist Sit to Supine: Patient Percentage: 50% Details for Bed Mobility Assistance: Assist for BLE into bed and to lower trunk. Transfers Transfers: Sit to Stand;Stand to Sit Sit to Stand: 1: +2 Total assist;From chair/3-in-1;With upper extremity assist Sit to Stand: Patient Percentage: 70% Stand to Sit: 1: +2 Total assist;To bed Stand to Sit: Patient Percentage: 70% Details for Transfer Assistance: Assist to rise, steady and ensure controlled descent with cues for  proper hand placement, RLE extension once in standing and flexion in order to sit with KI donned.  Ambulation/Gait Ambulation/Gait Assistance: 1: +2 Total assist Ambulation/Gait: Patient Percentage: 70% Ambulation Distance (Feet): 6 Feet Assistive device: Rolling walker Ambulation/Gait Assistance Details: cues for sequence, posture, position from RW and saftey awareness.  Physical assist for balance and support with advancing L LE - pt tolerating ltd wt on R LE Gait Pattern: Step-to pattern;Decreased stride length;Trunk flexed;Antalgic;Decreased weight shift to right;Decreased step length - right Gait velocity: decreased Stairs: No    Exercises     PT Diagnosis:    PT Problem List:   PT Treatment Interventions:     PT Goals (current goals can now be found in the care plan section) Acute Rehab PT Goals Patient Stated Goal: regain independence and decreased pain PT Goal Formulation: With patient Time For Goal Achievement: 02/12/13 Potential to Achieve Goals: Good  Visit Information  Last PT Received On: 02/03/13 Assistance Needed: +2 History of Present Illness: 79 yo female s/p R knee I&D, resection, antibiotic spacer placed. Recent hx of fall (11/1) resulting in multiple cervical fractures requiring Aspen collar at all times.     Subjective Data  Subjective: I'm hurting, but I want to try to do as much as I can.  Patient Stated Goal: regain independence and decreased pain   Cognition  Cognition Arousal/Alertness: Awake/alert Behavior During Therapy: WFL for tasks assessed/performed Overall Cognitive Status: Within Functional Limits for tasks assessed    Balance     End of Session PT - End of Session Equipment Utilized During Treatment: Cervical collar;Right knee immobilizer;Gait belt Activity  Tolerance: Patient limited by pain;Patient limited by fatigue Patient left: in bed;with call bell/phone within reach;with nursing/sitter in room Nurse Communication: Mobility status    GP     [name] [name] 02/03/2013, 3:25 PM

## 2013-02-03 NOTE — Progress Notes (Signed)
Physical Therapy Treatment Patient Details Name: [name] [name] MRN: [id number] DOB: 13-Feb-1931 Today's Date: 02/03/2013 Time: 7829-5621 PT Time Calculation (min): 20 min  PT Assessment / Plan / Recommendation  History of Present Illness 79 yo female s/p R knee I&D, resection, antibiotic spacer placed. Recent hx of fall (11/1) resulting in multiple cervical fractures requiring Aspen collar at all times.    PT Comments     Follow Up Recommendations  SNF     Does the patient have the potential to tolerate intense rehabilitation     Barriers to Discharge        Equipment Recommendations  Rolling walker with 5" wheels    Recommendations for Other Services OT consult  Frequency Min 5X/week   Progress towards PT Goals Progress towards PT goals: Progressing toward goals  Plan Current plan remains appropriate    Precautions / Restrictions Precautions Precautions: Fall;Cervical Precaution Comments: aspen collar; KI AAT, no ROM Required Braces or Orthoses: Cervical Brace;Knee Immobilizer - Right Knee Immobilizer - Right: On at all times Cervical Brace: At all times;Hard collar Restrictions Weight Bearing Restrictions: No RLE Weight Bearing: Weight bearing as tolerated   Pertinent Vitals/Pain 5/10; premed, cold packs provided    Mobility  Bed Mobility Bed Mobility: Not assessed Sit to Supine: 1: +2 Total assist Sit to Supine: Patient Percentage: 50% Details for Bed Mobility Assistance: Assist for BLE into bed and to lower trunk. Transfers Transfers: Not assessed Sit to Stand: 1: +2 Total assist;From chair/3-in-1;With upper extremity assist Sit to Stand: Patient Percentage: 70% Stand to Sit: 1: +2 Total assist;To bed Stand to Sit: Patient Percentage: 70% Details for Transfer Assistance: Assist to rise, steady and ensure controlled descent with cues for proper hand placement, RLE extension once in standing and flexion in order to sit with KI donned.   Ambulation/Gait Ambulation/Gait Assistance: Not tested (comment) Ambulation/Gait: Patient Percentage: 70% Ambulation Distance (Feet): 6 Feet Assistive device: Rolling walker Ambulation/Gait Assistance Details: cues for sequence, posture, position from RW and saftey awareness.  Physical assist for balance and support with advancing L LE - pt tolerating ltd wt on R LE Gait Pattern: Step-to pattern;Decreased stride length;Trunk flexed;Antalgic;Decreased weight shift to right;Decreased step length - right Gait velocity: decreased Stairs: No    Exercises Total Joint Exercises Ankle Circles/Pumps: AROM;Both;Supine;20 reps Quad Sets: AROM;Both;Supine;20 reps Hip ABduction/ADduction: AAROM;Right;Supine;20 reps Straight Leg Raises: AAROM;Right;Supine;20 reps   PT Diagnosis:    PT Problem List:   PT Treatment Interventions:     PT Goals (current goals can now be found in the care plan section) Acute Rehab PT Goals Patient Stated Goal: regain independence and decreased pain PT Goal Formulation: With patient Time For Goal Achievement: 02/12/13 Potential to Achieve Goals: Good  Visit Information  Last PT Received On: 02/03/13 Assistance Needed: +1 History of Present Illness: 79 yo female s/p R knee I&D, resection, antibiotic spacer placed. Recent hx of fall (11/1) resulting in multiple cervical fractures requiring Aspen collar at all times.     Subjective Data  Subjective: I'm hurting, but I want to try to do as much as I can.  Patient Stated Goal: regain independence and decreased pain   Cognition  Cognition Arousal/Alertness: Awake/alert Behavior During Therapy: WFL for tasks assessed/performed Overall Cognitive Status: Within Functional Limits for tasks assessed    Balance     End of Session PT - End of Session Equipment Utilized During Treatment: Cervical collar;Right knee immobilizer;Gait belt Activity Tolerance: Patient limited by pain;Patient limited by fatigue Patient  left: in bed;with call bell/phone within reach;with nursing/sitter in room Nurse Communication: Mobility status   GP     Daison Braxton 02/03/2013, 4:27 PM

## 2013-02-03 NOTE — Progress Notes (Signed)
[name] [name]  MRN: [id number] DOB/Age: 1930/10/08 79 y.o. Physician: Lynnea Maizes, [name].D. 6 Days Post-Op Procedure(s) (LRB): INCISION AND DRAINAGE RIGHT KNEE  RESECTION ARTHROPLASTY ANTIBIOTIC CEMENT SPACER    (Right)  Subjective: Resting comfortably this am, good appetite Vital Signs Temp:  [97.9 F (36.6 C)-99.5 F (37.5 C)] 97.9 F (36.6 C) (11/09 0530) Pulse Rate:  [72-88] 72 (11/09 0530) Resp:  [15-18] 16 (11/09 0530) BP: (117-154)/(61-73) 133/73 mmHg (11/09 0530) SpO2:  [95 %-98 %] 98 % (11/09 0530)  Lab Results  Recent Labs  02/02/13 0358 02/03/13 0332  WBC 24.1* 22.1*  HGB 7.7* 10.3*  HCT 24.5* 31.6*  PLT 366 360   BMET  Recent Labs  02/02/13 0358 02/03/13 0332  NA 130* 131*  K 4.0 3.9  CL 94* 95*  CO2 28 27  GLUCOSE 84 99  BUN 11 12  CREATININE 0.64 0.68  CALCIUM 8.9 9.0   INR  Date Value Range Status  01/26/2013 1.23  0.00 - 1.49 Final     Exam  Dressings soaked with blood but no obvious active drainage. Dressings changed and staples intact, 3cm eschar lateral margin of mid incision. N/V intact distally Forehead laceration with dried blood and eschar, sutures in place, will have nursing remove today  Plan IV ABX SNF Re check HGB in am Sutures out of forehead  [name] [name] 02/03/2013, 9:27 AM

## 2013-02-04 DIAGNOSIS — A491 Streptococcal infection, unspecified site: Secondary | ICD-10-CM | POA: Diagnosis not present

## 2013-02-04 LAB — BASIC METABOLIC PANEL
BUN: 11 mg/dL (ref 6–23)
CO2: 28 mEq/L (ref 19–32)
Calcium: 8.9 mg/dL (ref 8.4–10.5)
Glucose, Bld: 103 mg/dL — ABNORMAL HIGH (ref 70–99)
Potassium: 4.3 mEq/L (ref 3.5–5.1)
Sodium: 131 mEq/L — ABNORMAL LOW (ref 135–145)

## 2013-02-04 LAB — ANAEROBIC CULTURE

## 2013-02-04 LAB — CBC
HCT: 29.6 % — ABNORMAL LOW (ref 36.0–46.0)
Hemoglobin: 9.5 g/dL — ABNORMAL LOW (ref 12.0–15.0)
MCH: 28.1 pg (ref 26.0–34.0)
MCHC: 32.1 g/dL (ref 30.0–36.0)
MCV: 87.6 fL (ref 78.0–100.0)
RBC: 3.38 MIL/uL — ABNORMAL LOW (ref 3.87–5.11)
RDW: 16.9 % — ABNORMAL HIGH (ref 11.5–15.5)

## 2013-02-04 NOTE — Progress Notes (Signed)
[facility]    Date of Admission:  01/28/2013   Total days of antibiotics 8        Day 6 ceftriaxone                   ID: [name] [name] is a 79 y.o. 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   Medications:  . ALPRAZolam  0.5 mg Oral Daily   And  . ALPRAZolam  1 mg Oral QHS  . aspirin EC  325 mg Oral Q breakfast  . cefTRIAXone (ROCEPHIN)  IV  2 g Intravenous Q24H  . docusate sodium  100 mg Oral BID  . esomeprazole  40 mg Oral Daily  . ferrous sulfate  325 mg Oral BID  . fesoterodine  8 mg Oral Daily  . gabapentin  300 mg Oral BID  . levothyroxine  150 mcg Oral QAC breakfast  . simvastatin  20 mg Oral q morning - 10a    Objective: Vital signs in last 24 hours: Temp:  [98.6 F (37 C)-98.7 F (37.1 C)] 98.6 F (37 C) (11/10 0900) Pulse Rate:  [72-81] 72 (11/10 0900) Resp:  [16] 16 (11/10 0900) BP: (155-188)/(70-75) 188/73 mmHg (11/10 0900) SpO2:  [95 %-99 %] 99 % (11/10 0900) Constitutional: oriented to person, place, and time. Numerous facial ecchymosis to left forehead bilateral eyes, in c-collar. Surprisingly in No distress.  HENT: left forehead, sutures from temporal laceration  Mouth/Throat: Oropharynx is clear and moist. No oropharyngeal exudate.  Cardiovascular: Normal rate, regular rhythm and normal heart sounds. Exam reveals no gallop and no friction rub.  No murmur heard.  Pulmonary/Chest: Effort normal and breath sounds normal. No  respiratory distress. no wheezes.  Abdominal: Soft. Bowel sounds are normal.exhibits no distension. There is no tenderness.  Lymphadenopathy: no cervical adenopathy.  Neurological: alert and oriented to person, place, and time.  Skin: Skin is warm and dry. No rash noted. No erythema.  Ext: right leg wrapped from surgery, accordian drain in place Psychiatric: a normal mood and affect. behavior is normal.  Lab Results  Recent Labs  02/03/13 0332 02/04/13 0400  WBC 22.1* 19.8*  HGB 10.3* 9.5*  HCT 31.6* 29.6*  NA 131* 131*  K 3.9 4.3  CL 95* 96  CO2 27 28  BUN 12 11  CREATININE 0.68 0.58    Microbiology: 11/3 wound cx :GBS Studies/Results: No results found.   Assessment/Plan: 79 yo F with TKA in July 2014 c/b post op hematoma s/p I x D who has had poor wound healing and dehiscence. Admitted for management of group  b streptococcal infected prosthetic joint. POD#3   prosthesis resection and antibiotic spacer placement.   -  Continue with ceftriaxone 2gm IV daily for 6 wks   -  For discharge, she will need weekly cbc and cmp to ensure no side effects from long term antibiotics - picc line care per protocol and weekly dressing change - we will have her come back to  [facility] for follow up in 1-2 wks ,plus will see her at the conclusion of 6 wks of IV antibiotics on dec 8 or 9th   [name], [facility] Cell: [phone] Pager: [phone]  02/04/2013, 1:53 PM

## 2013-02-04 NOTE — Progress Notes (Signed)
Subjective: 7 Days Post-Op Procedure(s) (LRB): INCISION AND DRAINAGE RIGHT KNEE  RESECTION ARTHROPLASTY ANTIBIOTIC CEMENT SPACER    (Right) Patient reports pain as mild and moderate.   Patient seen in rounds with Dr. Lequita Halt. Patient is having problems with pain in the knee, requiring pain medications and also bleeding from incision area Plan is to go Skilled nursing facility after hospital stay.  Objective: Vital signs in last 24 hours: Temp:  [98.6 F (37 C)-98.7 F (37.1 C)] 98.6 F (37 C) (11/10 0900) Pulse Rate:  [72-81] 72 (11/10 0900) Resp:  [16] 16 (11/10 0900) BP: (155-188)/(70-75) 188/73 mmHg (11/10 0900) SpO2:  [95 %-99 %] 99 % (11/10 0900)  Intake/Output from previous day:  Intake/Output Summary (Last 24 hours) at 02/04/13 0959 Last data filed at 02/04/13 0936  Gross per 24 hour  Intake 1067.33 ml  Output   1875 ml  Net -807.67 ml    Intake/Output this shift: Total I/O In: 240 [P.O.:240] Out: 325 [Urine:325]  Labs:  Recent Labs  02/02/13 0358 02/03/13 0332 02/04/13 0400  HGB 7.7* 10.3* 9.5*    Recent Labs  02/03/13 0332 02/04/13 0400  WBC 22.1* 19.8*  RBC 3.65* 3.38*  HCT 31.6* 29.6*  PLT 360 338    Recent Labs  02/03/13 0332 02/04/13 0400  NA 131* 131*  K 3.9 4.3  CL 95* 96  CO2 27 28  BUN 12 11  CREATININE 0.68 0.58  GLUCOSE 99 103*  CALCIUM 9.0 8.9   No results found for this basename: LABPT, INR,  in the last 72 hours  EXAM General - Patient is Alert and Appropriate Extremity - Neurovascular intact Sensation intact distally Dressing/Incision - dehiscence of wound in the middle portion of the incision.  Dr. Lequita Halt able to express large amount of hematoma. Motor Function - intact, moving foot and toes well on exam.   Past Medical History  Diagnosis Date  . Mixed hyperlipidemia   . Hypothyroidism   . GERD (gastroesophageal reflux disease)   . Atrial fibrillation     Remote amiodarone use, recurred in 07/2011 post op  from knee arthroplasty  . Obese   . History of blood transfusion 08/31/2011    A+ type  . History of GI bleed     Gastritis, was on Xarelto and ASA  . Carpal tunnel syndrome   . Arthritis     Knees  . DJD (degenerative joint disease), thoracolumbar   . Rheumatoid arthritis   . H/O bronchitis   . Hemorrhoids   . Measles     as child  . Mumps     as child  . Carotid artery disease   . PONV (postoperative nausea and vomiting)   . Anemia   . Difficulty sleeping   . Multiple bruises     DUE TO FALL 01/24/13    Assessment/Plan: 7 Days Post-Op Procedure(s) (LRB): INCISION AND DRAINAGE RIGHT KNEE  RESECTION ARTHROPLASTY ANTIBIOTIC CEMENT SPACER    (Right) Active Problems:   Essential hypertension, benign   Hyponatremia   Postoperative anemia due to acute blood loss   Postop Transfusion   Surgical wound infection   Leukocytosis   Hypothyroidism   Necrosis of surgical wound   Acquired absence of knee joint following removal of joint prosthesis with presence of antibiotic-impregnated cement spacer   Group B streptococcal infection  Estimated body mass index is 24.68 kg/(m^2) as calculated from the following:   Height as of this encounter: 5' 5.35" (1.66 m).   Weight  as of this encounter: 68 kg (149 lb 14.6 oz). Up with therapy DVT Prophylaxis - Aspirin 325 mg  Weight-Bearing as tolerated to right leg but No Bending or No Motion  WBC trending down over weekend to 19.8 Na+ improving up to 131  HGB up to 10.3 after blood and HGB today is 9.5 Continue with ceftriaxone 2gm IV daily  PICC line for probably 6 weeks of IV antibiotic therapy.  Culture  ABUNDANT GROUP B STREP(S.AGALACTIAE)ISOLATED    Clayburn Weekly 02/04/2013, 9:59 AM

## 2013-02-04 NOTE — Progress Notes (Signed)
PT Cancellation Note  Patient Details Name: [name] [name] MRN: [id number] DOB: [birth year]   Cancelled Treatment:     PT deferred this date on advice of RN 2* partial reopening of wound and increased bleeding.  Will follow.   Sael Furches 02/04/2013, 5:57 PM

## 2013-02-05 LAB — URINALYSIS, ROUTINE W REFLEX MICROSCOPIC
Bilirubin Urine: NEGATIVE
Ketones, ur: NEGATIVE mg/dL
Leukocytes, UA: NEGATIVE
Nitrite: NEGATIVE
Protein, ur: NEGATIVE mg/dL
Urobilinogen, UA: 0.2 mg/dL (ref 0.0–1.0)
pH: 6 (ref 5.0–8.0)

## 2013-02-05 LAB — BASIC METABOLIC PANEL
BUN: 9 mg/dL (ref 6–23)
CO2: 27 mEq/L (ref 19–32)
Calcium: 8.8 mg/dL (ref 8.4–10.5)
Creatinine, Ser: 0.63 mg/dL (ref 0.50–1.10)
GFR calc non Af Amer: 82 mL/min — ABNORMAL LOW (ref >90)
Glucose, Bld: 108 mg/dL — ABNORMAL HIGH (ref 70–99)
Potassium: 4.2 mEq/L (ref 3.5–5.1)

## 2013-02-05 LAB — CBC
Hemoglobin: 8.5 g/dL — ABNORMAL LOW (ref 12.0–15.0)
MCH: 29.1 pg (ref 26.0–34.0)
MCHC: 33.1 g/dL (ref 30.0–36.0)
MCV: 88 fL (ref 78.0–100.0)
Platelets: 391 10*3/uL (ref 150–400)
RBC: 2.92 MIL/uL — ABNORMAL LOW (ref 3.87–5.11)
RDW: 16.8 % — ABNORMAL HIGH (ref 11.5–15.5)

## 2013-02-05 MED ORDER — ENSURE COMPLETE PO LIQD
237.0000 mL | Freq: Once | ORAL | Status: AC
  Administered 2013-02-05: 15:00:00 237 mL via ORAL

## 2013-02-05 MED ORDER — ENSURE COMPLETE PO LIQD
237.0000 mL | Freq: Three times a day (TID) | ORAL | Status: DC
  Administered 2013-02-05 – 2013-03-05 (×58): 237 mL via ORAL

## 2013-02-05 NOTE — Progress Notes (Signed)
[facility]    Date of Admission:  01/28/2013   Total days of antibiotics 8        Day 6 ceftriaxone                   ID: [name] [name] is a 79 y.o. female with  GBS wound dehiscence, prosthetic joint s/p explant of old prosthesis and has antibiotic spacer Active Problems:   Essential hypertension, benign   Hyponatremia   Postoperative anemia due to acute blood loss   Postop Transfusion   Surgical wound infection   Leukocytosis   Hypothyroidism   Necrosis of surgical wound   Acquired absence of knee joint following removal of joint prosthesis with presence of antibiotic-impregnated cement spacer   Group B streptococcal infection    Subjective: Had a fever of 101.9 yesterday with concurrent increase in wbc.  Medications:  . ALPRAZolam  0.5 mg Oral Daily   And  . ALPRAZolam  1 mg Oral QHS  . cefTRIAXone (ROCEPHIN)  IV  2 g Intravenous Q24H  . docusate sodium  100 mg Oral BID  . esomeprazole  40 mg Oral Daily  . feeding supplement (ENSURE COMPLETE)  237 mL Oral TID WC  . ferrous sulfate  325 mg Oral BID  . fesoterodine  8 mg Oral Daily  . gabapentin  300 mg Oral BID  . levothyroxine  150 mcg Oral QAC breakfast  . simvastatin  20 mg Oral q morning - 10a    Objective: Vital signs in last 24 hours: Temp:  [98 F (36.7 C)-99.3 F (37.4 C)] 98 F (36.7 C) (11/11 1415) Pulse Rate:  [79-90] 80 (11/11 1415) Resp:  [16] 16 (11/11 1415) BP: (143-144)/(70-75) 144/75 mmHg (11/11 1415) SpO2:  [93 %-96 %] 95 % (11/11 1415) Constitutional: oriented to person, place, and time. Numerous facial ecchymosis to left forehead bilateral eyes, in c-collar. Surprisingly in No distress.  HENT: left forehead, sutures from temporal laceration  Mouth/Throat: Oropharynx is clear and moist. No oropharyngeal exudate.  Cardiovascular: Normal rate, regular rhythm and normal heart sounds. Exam reveals no gallop and no friction rub.  No murmur heard.  Pulmonary/Chest:  Effort normal and breath sounds normal. No respiratory distress. no wheezes.  Abdominal: Soft. Bowel sounds are normal.exhibits no distension. There is no tenderness.  Lymphadenopathy: no cervical adenopathy.  Neurological: alert and oriented to person, place, and time.  Skin: Skin is warm and dry. No rash noted. No erythema.  Ext: right leg wrapped from surgery, accordian drain in place Psychiatric: a normal mood and affect. behavior is normal.  Lab Results  Recent Labs  02/04/13 0400 02/05/13 0445  WBC 19.8* 25.6*  HGB 9.5* 8.5*  HCT 29.6* 25.7*  NA 131* 127*  K 4.3 4.2  CL 96 92*  CO2 28 27  BUN 11 9  CREATININE 0.58 0.63    Microbiology: 11/3 wound cx :GBS Studies/Results: No results found.   Assessment/Plan: 79 yo F with TKA in July 2014 c/b post op hematoma s/p I x D who has had poor wound healing and dehiscence. Admitted for management of group  b streptococcal infected prosthetic joint. POD#3   prosthesis resection and antibiotic spacer placement. Had a fever yesterday.  -  Continue with ceftriaxone 2gm IV daily for 6 wks  -  Will get repeat blood culture, ua and urine culture, cxr as part of work up.   -----------  -  For discharge, she will need weekly  cbc and cmp to ensure no side effects from long term antibiotics - picc line care per protocol and weekly dressing change - we will have her come back to [facility] for follow up in 1-2 wks ,plus will see her at the conclusion of 6 wks of IV antibiotics on dec 8 or 9th   Aivan Fillingim, [facility] Cell: [phone] Pager: [phone]  02/05/2013, 5:20 PM

## 2013-02-05 NOTE — Progress Notes (Signed)
Physical Therapy Treatment Patient Details Name: [name] [name] MRN: [id number] DOB: 1930/09/17 Today's Date: 02/05/2013 Time: 1425-1450 PT Time Calculation (min): 25 min  PT Assessment / Plan / Recommendation  History of Present Illness 79 yo female s/p R knee I&D, resection, antibiotic spacer placed. Recent hx of fall (11/1) resulting in multiple cervical fractures requiring Aspen collar at all times.    PT Comments   Pt with wound dehiscence yesterday, therefore RN okayed PT to assist pt with stand pivot transfer bed to 3in1 and also to recliner to maintain UE strength/endurance.  Plan is to return to surgery tomorrow for repair of dehiscence.    Follow Up Recommendations  SNF     Does the patient have the potential to tolerate intense rehabilitation     Barriers to Discharge        Equipment Recommendations  Rolling walker with 5" wheels    Recommendations for Other Services    Frequency Min 5X/week   Progress towards PT Goals Progress towards PT goals: Progressing toward goals  Plan Current plan remains appropriate    Precautions / Restrictions Precautions Precautions: Fall;Cervical Precaution Comments: aspen collar; KI AAT, no ROM Required Braces or Orthoses: Cervical Brace;Knee Immobilizer - Right Knee Immobilizer - Right: On at all times Cervical Brace: At all times;Hard collar Restrictions Weight Bearing Restrictions: No RLE Weight Bearing: Weight bearing as tolerated Other Position/Activity Restrictions: Only did bed to chair due to wound dehiscence    Pertinent Vitals/Pain 8/10    Mobility  Bed Mobility Bed Mobility: Supine to Sit Supine to Sit: 3: Mod assist;HOB elevated;With rails Details for Bed Mobility Assistance: Assist for BLEs out of bed with pt able to self assist trunk with use of handrail.  Provided cues for safety and technique.  Transfers Transfers: Sit to Stand;Stand to Sit;Stand Pivot Transfers Sit to Stand: 3: Mod assist;From elevated  surface;With upper extremity assist;From bed;From chair/3-in-1 Stand to Sit: 3: Mod assist;With upper extremity assist;With armrests;To chair/3-in-1 Stand Pivot Transfers: 3: Mod assist;With armrests Details for Transfer Assistance: Assist to rise, steady and ensure controlled descent with cues for hand placement, LE management and sequencing with RW.  Educated to keep most of weight off of RLE for safety.  Performed stand pivot to 3in1 then to recliner.  Ambulation/Gait Assistive device: Rolling walker    Exercises     PT Diagnosis:    PT Problem List:   PT Treatment Interventions:     PT Goals (current goals can now be found in the care plan section) Acute Rehab PT Goals Patient Stated Goal: regain independence and decreased pain PT Goal Formulation: With patient Time For Goal Achievement: 02/12/13 Potential to Achieve Goals: Good  Visit Information  Last PT Received On: 02/05/13 Assistance Needed: +1 History of Present Illness: 79 yo female s/p R knee I&D, resection, antibiotic spacer placed. Recent hx of fall (11/1) resulting in multiple cervical fractures requiring Aspen collar at all times.     Subjective Data  Subjective: I want to try to do whatever I can Patient Stated Goal: regain independence and decreased pain   Cognition  Cognition Arousal/Alertness: Awake/alert Behavior During Therapy: WFL for tasks assessed/performed Overall Cognitive Status: Within Functional Limits for tasks assessed    Balance     End of Session PT - End of Session Equipment Utilized During Treatment: Cervical collar;Right knee immobilizer;Gait belt Activity Tolerance: Patient limited by pain;Patient limited by fatigue Patient left: in bed;with call bell/phone within reach;with nursing/sitter in room Nurse Communication: Mobility  status   GP     Vista Deck 02/05/2013, 4:27 PM

## 2013-02-05 NOTE — Progress Notes (Signed)
Discussed in LOS rounds.

## 2013-02-05 NOTE — Progress Notes (Signed)
Subjective: 8 Days Post-Op Procedure(s) (LRB): INCISION AND DRAINAGE RIGHT KNEE  RESECTION ARTHROPLASTY ANTIBIOTIC CEMENT SPACER    (Right) Patient reports pain as mild and moderate.   Patient seen in rounds with Dr. Lequita Halt. Patient is having problems with continued drainage from open wound. We will start therapy today.  Plan is to go Skilled nursing facility after hospital stay.  Objective: Vital signs in last 24 hours: Temp:  [98.4 F (36.9 C)-101.9 F (38.8 C)] 98.4 F (36.9 C) (11/11 0450) Pulse Rate:  [72-95] 79 (11/11 0450) Resp:  [16] 16 (11/11 0450) BP: (143-188)/(70-76) 143/74 mmHg (11/11 0450) SpO2:  [93 %-99 %] 93 % (11/11 0450)  Intake/Output from previous day:  Intake/Output Summary (Last 24 hours) at 02/05/13 0850 Last data filed at 02/05/13 0555  Gross per 24 hour  Intake 570.33 ml  Output   1325 ml  Net -754.67 ml     Labs:  Recent Labs  02/03/13 0332 02/04/13 0400 02/05/13 0445  HGB 10.3* 9.5* 8.5*    Recent Labs  02/04/13 0400 02/05/13 0445  WBC 19.8* 25.6*  RBC 3.38* 2.92*  HCT 29.6* 25.7*  PLT 338 391    Recent Labs  02/04/13 0400 02/05/13 0445  NA 131* 127*  K 4.3 4.2  CL 96 92*  CO2 28 27  BUN 11 9  CREATININE 0.58 0.63  GLUCOSE 103* 108*  CALCIUM 8.9 8.8   No results found for this basename: LABPT, INR,  in the last 72 hours  EXAM General - Patient is Alert, Appropriate and Oriented Extremity - Neurovascular intact Sensation intact distally Dorsiflexion/Plantar flexion intact Dressing - moderate drainage Motor Function - intact, moving foot and toes well on exam.  Incision with open wound in mid portion.  Continued bloody drainage from wound.  Past Medical History  Diagnosis Date  . Mixed hyperlipidemia   . Hypothyroidism   . GERD (gastroesophageal reflux disease)   . Atrial fibrillation     Remote amiodarone use, recurred in 07/2011 post op from knee arthroplasty  . Obese   . History of blood transfusion  08/31/2011    A+ type  . History of GI bleed     Gastritis, was on Xarelto and ASA  . Carpal tunnel syndrome   . Arthritis     Knees  . DJD (degenerative joint disease), thoracolumbar   . Rheumatoid arthritis   . H/O bronchitis   . Hemorrhoids   . Measles     as child  . Mumps     as child  . Carotid artery disease   . PONV (postoperative nausea and vomiting)   . Anemia   . Difficulty sleeping   . Multiple bruises     DUE TO FALL 01/24/13    Assessment/Plan: 8 Days Post-Op Procedure(s) (LRB): INCISION AND DRAINAGE RIGHT KNEE  RESECTION ARTHROPLASTY ANTIBIOTIC CEMENT SPACER    (Right) Active Problems:   Essential hypertension, benign   Hyponatremia   Postoperative anemia due to acute blood loss   Postop Transfusion   Surgical wound infection   Leukocytosis   Hypothyroidism   Necrosis of surgical wound   Acquired absence of knee joint following removal of joint prosthesis with presence of antibiotic-impregnated cement spacer   Group B streptococcal infection  Estimated body mass index is 24.68 kg/(m^2) as calculated from the following:   Height as of this encounter: 5' 5.35" (1.66 m).   Weight as of this encounter: 68 kg (149 lb 14.6 oz).  Discussed the continued  drainage and open wound with Dr. Lequita Halt this morning.  Recommended for repeat surgical debridement and exploration of wound tomorrow.  DVT Prophylaxis - Aspirin 325 mg - Hold today Weight-Bearing as tolerated to right leg but No Bending or No Motion  WBC trending down over weekend to 19.8 but back up today to 25.6 Na+ had improved but down some to 127.  Recheck in AM HGB up to 10.3 after blood but back down to 8.5 today.  Will type and cross today.  Will likely need more blood with surgery anticipated tomorrow. Continue with ceftriaxone 2gm IV daily  PICC line for probably 6 weeks of IV antibiotic therapy.  Culture  ABUNDANT GROUP B STREP(S.AGALACTIAE)ISOLATED     PERKINS, ALEXZANDREW 02/05/2013, 8:50  AM

## 2013-02-06 ENCOUNTER — Encounter (HOSPITAL_COMMUNITY)
Admission: RE | Disposition: A | Discharge status: Skilled Nursing Facility | Payer: Self-pay | Source: Ambulatory Visit | Attending: Orthopedic Surgery

## 2013-02-06 ENCOUNTER — Encounter (HOSPITAL_COMMUNITY): Payer: Medicare Other | Admitting: Anesthesiology

## 2013-02-06 ENCOUNTER — Inpatient Hospital Stay (HOSPITAL_COMMUNITY): Payer: Medicare Other | Admitting: Anesthesiology

## 2013-02-06 HISTORY — PX: IRRIGATION AND DEBRIDEMENT KNEE: SHX5185

## 2013-02-06 LAB — BASIC METABOLIC PANEL WITH GFR
BUN: 14 mg/dL (ref 6–23)
CO2: 29 meq/L (ref 19–32)
Calcium: 8.3 mg/dL — ABNORMAL LOW (ref 8.4–10.5)
Chloride: 94 meq/L — ABNORMAL LOW (ref 96–112)
Creatinine, Ser: 0.56 mg/dL (ref 0.50–1.10)
GFR calc Af Amer: >90 mL/min
GFR calc non Af Amer: 86 mL/min — ABNORMAL LOW
Glucose, Bld: 114 mg/dL — ABNORMAL HIGH (ref 70–99)
Potassium: 4.2 meq/L (ref 3.5–5.1)
Sodium: 129 meq/L — ABNORMAL LOW (ref 135–145)

## 2013-02-06 LAB — CBC
HCT: 24.4 % — ABNORMAL LOW (ref 36.0–46.0)
Hemoglobin: 8 g/dL — ABNORMAL LOW (ref 12.0–15.0)
Hemoglobin: 10 g/dL — ABNORMAL LOW (ref 12.0–15.0)
MCH: 29 pg (ref 26.0–34.0)
MCHC: 32.8 g/dL (ref 30.0–36.0)
MCV: 88.4 fL (ref 78.0–100.0)
Platelets: 390 10*3/uL (ref 150–400)
RBC: 2.76 MIL/uL — ABNORMAL LOW (ref 3.87–5.11)
RBC: 3.35 MIL/uL — ABNORMAL LOW (ref 3.87–5.11)
RDW: 16.8 % — ABNORMAL HIGH (ref 11.5–15.5)
WBC: 18.7 10*3/uL — ABNORMAL HIGH (ref 4.0–10.5)
WBC: 28.8 10*3/uL — ABNORMAL HIGH (ref 4.0–10.5)

## 2013-02-06 LAB — PREPARE RBC (CROSSMATCH)

## 2013-02-06 SURGERY — IRRIGATION AND DEBRIDEMENT KNEE
Anesthesia: General | Site: Knee | Laterality: Right | Wound class: Dirty or Infected

## 2013-02-06 MED ORDER — MIDAZOLAM HCL 2 MG/2ML IJ SOLN
0.5000 mg | INTRAMUSCULAR | Status: DC | PRN
  Administered 2013-02-06: 0.5 mg via INTRAVENOUS

## 2013-02-06 MED ORDER — LACTATED RINGERS IV SOLN: INTRAVENOUS | Status: DC

## 2013-02-06 MED ORDER — MIDAZOLAM HCL 2 MG/2ML IJ SOLN
INTRAMUSCULAR | Status: AC
  Filled 2013-02-06: qty 2

## 2013-02-06 MED ORDER — PROMETHAZINE HCL 25 MG/ML IJ SOLN: 6.2500 mg | INTRAMUSCULAR | Status: DC | PRN

## 2013-02-06 MED ORDER — PHENYLEPHRINE HCL 10 MG/ML IJ SOLN
INTRAMUSCULAR | Status: DC | PRN
  Administered 2013-02-06: 40 ug via INTRAVENOUS

## 2013-02-06 MED ORDER — HYDROMORPHONE HCL PF 1 MG/ML IJ SOLN
0.2500 mg | INTRAMUSCULAR | Status: DC | PRN
  Administered 2013-02-06 (×2): 0.25 mg via INTRAVENOUS
  Administered 2013-02-06 (×2): 0.5 mg via INTRAVENOUS

## 2013-02-06 MED ORDER — HYDROMORPHONE HCL PF 1 MG/ML IJ SOLN
INTRAMUSCULAR | Status: AC
  Filled 2013-02-06: qty 1

## 2013-02-06 MED ORDER — TRAMADOL HCL 50 MG PO TABS
50.0000 mg | ORAL_TABLET | Freq: Four times a day (QID) | ORAL | Status: DC | PRN
  Administered 2013-02-08 – 2013-02-20 (×16): 100 mg via ORAL
  Administered 2013-02-21: 50 mg via ORAL
  Administered 2013-02-21 – 2013-02-27 (×4): 100 mg via ORAL
  Filled 2013-02-06: qty 2
  Filled 2013-02-06: qty 1
  Filled 2013-02-06 (×20): qty 2

## 2013-02-06 MED ORDER — LACTATED RINGERS IV SOLN
INTRAVENOUS | Status: DC | PRN
  Administered 2013-02-06: 17:00:00 via INTRAVENOUS

## 2013-02-06 MED ORDER — THROMBIN 5000 UNITS EX SOLR
CUTANEOUS | Status: AC
  Filled 2013-02-06: qty 10000

## 2013-02-06 MED ORDER — FENTANYL CITRATE 0.05 MG/ML IJ SOLN: 25.0000 ug | INTRAMUSCULAR | Status: DC | PRN

## 2013-02-06 MED ORDER — MEPERIDINE HCL 50 MG/ML IJ SOLN
6.2500 mg | INTRAMUSCULAR | Status: DC | PRN
  Administered 2013-02-06: 12.5 mg via INTRAVENOUS

## 2013-02-06 MED ORDER — 0.9 % SODIUM CHLORIDE (POUR BTL) OPTIME
TOPICAL | Status: DC | PRN
  Administered 2013-02-06: 1000 mL

## 2013-02-06 MED ORDER — SODIUM CHLORIDE 0.9 % IV SOLN
INTRAVENOUS | Status: DC
  Administered 2013-02-06 – 2013-02-17 (×10): via INTRAVENOUS
  Administered 2013-02-21: 20 mL/h via INTRAVENOUS
  Administered 2013-03-03: 04:00:00 500 mL via INTRAVENOUS

## 2013-02-06 MED ORDER — SODIUM CHLORIDE 0.9 % IV SOLN: INTRAVENOUS | Status: DC

## 2013-02-06 MED ORDER — SODIUM CHLORIDE 0.9 % IV SOLN
INTRAVENOUS | Status: DC | PRN
  Administered 2013-02-06: 19:00:00 via INTRAVENOUS

## 2013-02-06 MED ORDER — PROPOFOL 10 MG/ML IV BOLUS
INTRAVENOUS | Status: DC | PRN
  Administered 2013-02-06: 50 mg via INTRAVENOUS
  Administered 2013-02-06: 80 mg via INTRAVENOUS

## 2013-02-06 MED ORDER — FENTANYL CITRATE 0.05 MG/ML IJ SOLN
INTRAMUSCULAR | Status: DC | PRN
Start: 2013-02-06 — End: 2013-02-06
  Administered 2013-02-06 (×4): 25 ug via INTRAVENOUS

## 2013-02-06 MED ORDER — SODIUM CHLORIDE 0.9 % IR SOLN
Status: DC | PRN
  Administered 2013-02-06: 4000 mL

## 2013-02-06 MED ORDER — THROMBIN 5000 UNITS EX SOLR
CUTANEOUS | Status: DC | PRN
  Administered 2013-02-06: 10000 [IU] via TOPICAL

## 2013-02-06 MED ORDER — SODIUM CHLORIDE 0.9 % IJ SOLN
INTRAMUSCULAR | Status: AC
  Filled 2013-02-06: qty 3

## 2013-02-06 MED ORDER — MEPERIDINE HCL 50 MG/ML IJ SOLN
INTRAMUSCULAR | Status: AC
  Filled 2013-02-06: qty 1

## 2013-02-06 MED ORDER — BUPIVACAINE-EPINEPHRINE PF 0.25-1:200000 % IJ SOLN
INTRAMUSCULAR | Status: AC
  Filled 2013-02-06: qty 30

## 2013-02-06 SURGICAL SUPPLY — 47 items
BAG SPEC THK2 15X12 ZIP CLS (MISCELLANEOUS) ×1
BAG ZIPLOCK 12X15 (MISCELLANEOUS) ×2 IMPLANT
BANDAGE ELASTIC 6 VELCRO ST LF (GAUZE/BANDAGES/DRESSINGS) ×2 IMPLANT
BANDAGE ESMARK 6X9 LF (GAUZE/BANDAGES/DRESSINGS) ×1 IMPLANT
BNDG CMPR 9X6 STRL LF SNTH (GAUZE/BANDAGES/DRESSINGS) ×1
BNDG ESMARK 6X9 LF (GAUZE/BANDAGES/DRESSINGS) ×2
CLOTH BEACON ORANGE TIMEOUT ST (SAFETY) ×2 IMPLANT
CONT SPECI 4OZ STER CLIK (MISCELLANEOUS) ×2 IMPLANT
CUFF TOURN SGL QUICK 34 (TOURNIQUET CUFF) ×2
CUFF TRNQT CYL 34X4X40X1 (TOURNIQUET CUFF) ×1 IMPLANT
DRAPE EXTREMITY T 121X128X90 (DRAPE) ×2 IMPLANT
DRAPE POUCH INSTRU U-SHP 10X18 (DRAPES) ×2 IMPLANT
DRAPE U-SHAPE 47X51 STRL (DRAPES) ×2 IMPLANT
DRSG ADAPTIC 3X8 NADH LF (GAUZE/BANDAGES/DRESSINGS) ×2 IMPLANT
DRSG PAD ABDOMINAL 8X10 ST (GAUZE/BANDAGES/DRESSINGS) ×4 IMPLANT
DURAPREP 26ML APPLICATOR (WOUND CARE) ×2 IMPLANT
ELECT REM PT RETURN 9FT ADLT (ELECTROSURGICAL) ×2
ELECTRODE REM PT RTRN 9FT ADLT (ELECTROSURGICAL) ×1 IMPLANT
EVACUATOR 1/8 PVC DRAIN (DRAIN) ×3 IMPLANT
GLOVE BIO SURGEON STRL SZ7.5 (GLOVE) ×2 IMPLANT
GLOVE BIO SURGEON STRL SZ8 (GLOVE) ×4 IMPLANT
GLOVE BIOGEL PI IND STRL 8 (GLOVE) ×2 IMPLANT
GLOVE BIOGEL PI INDICATOR 8 (GLOVE) ×2
GLOVE ECLIPSE 8.0 STRL XLNG CF (GLOVE) IMPLANT
GOWN PREVENTION PLUS LG XLONG (DISPOSABLE) ×2 IMPLANT
GOWN STRL REIN XL XLG (GOWN DISPOSABLE) ×4 IMPLANT
HANDPIECE INTERPULSE COAX TIP (DISPOSABLE) ×2
KIT BASIN OR (CUSTOM PROCEDURE TRAY) ×2 IMPLANT
MANIFOLD NEPTUNE II (INSTRUMENTS) ×2 IMPLANT
PACK TOTAL JOINT (CUSTOM PROCEDURE TRAY) ×2 IMPLANT
PADDING CAST COTTON 6X4 STRL (CAST SUPPLIES) ×5 IMPLANT
POSITIONER SURGICAL ARM (MISCELLANEOUS) ×2 IMPLANT
SET HNDPC FAN SPRY TIP SCT (DISPOSABLE) ×1 IMPLANT
SPONGE GAUZE 4X4 12PLY (GAUZE/BANDAGES/DRESSINGS) ×2 IMPLANT
SPONGE LAP 18X18 X RAY DECT (DISPOSABLE) ×3 IMPLANT
SPONGE SURGIFOAM ABS GEL 100 (HEMOSTASIS) ×1 IMPLANT
STAPLER VISISTAT 35W (STAPLE) ×2 IMPLANT
STRIP CLOSURE SKIN 1/2X4 (GAUZE/BANDAGES/DRESSINGS) ×4 IMPLANT
SUT ETHILON 2 0 PS N (SUTURE) ×2 IMPLANT
SUT MNCRL AB 4-0 PS2 18 (SUTURE) ×2 IMPLANT
SUT PDS AB 1 CTX 36 (SUTURE) ×2 IMPLANT
SUT VIC AB 2-0 CT1 27 (SUTURE) ×8
SUT VIC AB 2-0 CT1 TAPERPNT 27 (SUTURE) ×3 IMPLANT
SUT VLOC 180 0 24IN GS25 (SUTURE) ×2 IMPLANT
SWAB COLLECTION DEVICE MRSA (MISCELLANEOUS) ×1 IMPLANT
TOWEL OR 17X26 10 PK STRL BLUE (TOWEL DISPOSABLE) ×4 IMPLANT
TUBE ANAEROBIC SPECIMEN COL (MISCELLANEOUS) ×1 IMPLANT

## 2013-02-06 NOTE — Anesthesia Preprocedure Evaluation (Signed)
Anesthesia Evaluation  Patient identified by MRN, date of birth, ID band Patient awake  General Assessment Comment:Advanced degenerative disc disease with diffuse disc narrowing. Disc bulges are notable at C4-5 and C5-6 at least contacting and, likely compressing, the cervical cord. No gross canal hematoma.   Reviewed: Allergy & Precautions, H&P , NPO status , Patient's Chart, lab work & pertinent test results  History of Anesthesia Complications (+) PONV and history of anesthetic complications  Airway Mallampati: II TM Distance: >3 FB Neck ROM: Limited    Dental no notable dental hx.    Pulmonary neg pulmonary ROS,  breath sounds clear to auscultation  Pulmonary exam normal       Cardiovascular hypertension, Pt. on medications + Peripheral Vascular Disease + dysrhythmias Rhythm:Regular Rate:Normal     Neuro/Psych  Neuromuscular disease negative psych ROS   GI/Hepatic Neg liver ROS, GERD-  ,  Endo/Other  Hypothyroidism   Renal/GU negative Renal ROS     Musculoskeletal  (+) Arthritis -, Rheumatoid disorders,    Abdominal   Peds  Hematology  (+) Blood dyscrasia, anemia ,   Anesthesia Other Findings   Reproductive/Obstetrics negative OB ROS                           Anesthesia Physical  Anesthesia Plan  ASA: III  Anesthesia Plan: General   Post-op Pain Management:    Induction: Intravenous  Airway Management Planned: LMA  Additional Equipment:   Intra-op Plan:   Post-operative Plan:   Informed Consent: I have reviewed the patients History and Physical, chart, labs and discussed the procedure including the risks, benefits and alternatives for the proposed anesthesia with the patient or authorized representative who has indicated his/her understanding and acceptance.   Dental advisory given  Plan Discussed with: CRNA and Surgeon  Anesthesia Plan Comments:          Anesthesia Quick Evaluation

## 2013-02-06 NOTE — H&P (View-Only) (Signed)
   Subjective: 5 Days Post-Op Procedure(s) (LRB): INCISION AND DRAINAGE RIGHT KNEE  RESECTION ARTHROPLASTY ANTIBIOTIC CEMENT SPACER    (Right) Patient reports pain as moderate.   Patient is still feeling weak Plan is to go Skilled nursing facility after hospital stay.  Objective: Vital signs in last 24 hours: Temp:  [98.6 F (37 C)-98.9 F (37.2 C)] 98.6 F (37 C) (11/08 0620) Pulse Rate:  [85-88] 85 (11/08 0620) Resp:  [18] 18 (11/08 0620) BP: (132-155)/(70-79) 143/70 mmHg (11/08 0620) SpO2:  [97 %-99 %] 98 % (11/08 0620)  Intake/Output from previous day:  Intake/Output Summary (Last 24 hours) at 02/02/13 0820 Last data filed at 02/02/13 0803  Gross per 24 hour  Intake    491 ml  Output   2225 ml  Net  -1734 ml    Intake/Output this shift: Total I/O In: 360 [P.O.:360] Out: -   Labs:  Recent Labs  01/31/13 0610 02/01/13 0940 02/02/13 0358  HGB 8.5* 8.9* 7.7*    Recent Labs  02/01/13 0940 02/02/13 0358  WBC 25.0* 24.1*  RBC 3.13* 2.81*  HCT 27.3* 24.5*  PLT 395 366    Recent Labs  02/01/13 0940 02/02/13 0358  NA 129* 130*  K 4.5 4.0  CL 94* 94*  CO2 28 28  BUN 12 11  CREATININE 0.74 0.64  GLUCOSE 109* 84  CALCIUM 8.9 8.9   No results found for this basename: LABPT, INR,  in the last 72 hours  EXAM General - Patient is Alert, Appropriate and Oriented Extremity - Neurologically intact Neurovascular intact No cellulitis present Compartment soft decreased bloody drainage from knee Motor Function - intact, moving foot and toes well on exam.    Past Medical History  Diagnosis Date  . Mixed hyperlipidemia   . Hypothyroidism   . GERD (gastroesophageal reflux disease)   . Atrial fibrillation     Remote amiodarone use, recurred in 07/2011 post op from knee arthroplasty  . Obese   . History of blood transfusion 08/31/2011    A+ type  . History of GI bleed     Gastritis, was on Xarelto and ASA  . Carpal tunnel syndrome   . Arthritis    Knees  . DJD (degenerative joint disease), thoracolumbar   . Rheumatoid arthritis   . H/O bronchitis   . Hemorrhoids   . Measles     as child  . Mumps     as child  . Carotid artery disease   . PONV (postoperative nausea and vomiting)   . Anemia   . Difficulty sleeping   . Multiple bruises     DUE TO FALL 01/24/13    Assessment/Plan: 5 Days Post-Op Procedure(s) (LRB): INCISION AND DRAINAGE RIGHT KNEE  RESECTION ARTHROPLASTY ANTIBIOTIC CEMENT SPACER    (Right) Active Problems:   Necrosis of surgical wound   Essential hypertension, benign   Hyponatremia   Postoperative anemia due to acute blood loss   Postop Transfusion   Surgical wound infection   Leukocytosis   Hypothyroidism   Acquired absence of knee joint following removal of joint prosthesis with presence of antibiotic-impregnated cement spacer  Will transfuse 2 units PRBCs today due to symptomatic post-op anemia.  DVT Prophylaxis - Aspirin Weight-Bearing as tolerated to right leg  [name] [name] 02/02/2013, 8:20 AM 

## 2013-02-06 NOTE — Progress Notes (Signed)
[facility]    Date of Admission:  01/28/2013   Total days of antibiotics 9        Day 7 ceftriaxone                   ID: [name] [name] is a 79 y.o. 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  Medications:  . ALPRAZolam  0.5 mg Oral Daily   And  . ALPRAZolam  1 mg Oral QHS  . cefTRIAXone (ROCEPHIN)  IV  2 g Intravenous Q24H  . docusate sodium  100 mg Oral BID  . esomeprazole  40 mg Oral Daily  . feeding supplement (ENSURE COMPLETE)  237 mL Oral TID WC  . ferrous sulfate  325 mg Oral BID  . fesoterodine  8 mg Oral Daily  . gabapentin  300 mg Oral BID  . levothyroxine  150 mcg Oral QAC breakfast  . simvastatin  20 mg Oral q morning - 10a    Objective: Vital signs in last 24 hours: Temp:  [97.9 F (36.6 C)-98.7 F (37.1 C)] 98.3 F (36.8 C) (11/12 1416) Pulse Rate:  [75-88] 88 (11/12 1416) Resp:  [16] 16 (11/12 1416) BP: (112-150)/(65-88) 114/65 mmHg (11/12 1416) SpO2:  [95 %-96 %] 95 % (11/12 1416) Constitutional: oriented to person, place, and time. Numerous facial ecchymosis to left forehead bilateral eyes, in c-collar. Surprisingly in No distress.  HENT: left forehead, sutures from temporal laceration  Mouth/Throat: Oropharynx is clear and moist. No oropharyngeal exudate.  Cardiovascular: Normal rate, regular rhythm and normal heart sounds. Exam reveals no gallop and no friction rub.  No murmur heard.    Pulmonary/Chest: Effort normal and breath sounds normal. No respiratory distress. no wheezes.  Abdominal: Soft. Bowel sounds are normal.exhibits no distension. There is no tenderness.  Lymphadenopathy: no cervical adenopathy. Neck still in c-collar Neurological: alert and oriented to person, place, and time.  Skin: Skin is warm and dry. No rash noted. No erythema.  Ext: right leg wrapped from surgery, Psychiatric: a normal mood and affect. behavior is normal.  Lab Results  Recent Labs  02/05/13 0445 02/06/13 0550  WBC 25.6* 18.7*  HGB 8.5* 8.0*  HCT 25.7* 24.4*  NA 127* 129*  K 4.2 4.2  CL 92* 94*  CO2 27 29  BUN 9 14  CREATININE 0.63 0.56    Microbiology: 11/3 wound cx :GBS Studies/Results: No results found.   Assessment/Plan: 79 yo F with TKA in July 2014 c/b post op hematoma s/p I x D who has had poor wound healing and dehiscence. Admitted for management of group  b streptococcal infected prosthetic joint. POD#3   prosthesis resection and antibiotic spacer placement. Had isolated fevers, but blood culture and ua/urine cx are unrevealing  -  Continue with ceftriaxone 2gm IV daily for 6 wks   -  For discharge, she will need weekly cbc and cmp to ensure no side effects from long term antibiotics - picc  line care per protocol and weekly dressing change - we will have her come back to [facility] for follow up in 1-2 wks ,plus will see her at the conclusion of 6 wks of IV antibiotics on dec 8 or 9th   Amanda Pote, [facility] Cell: [phone] Pager: [phone]  02/06/2013, 3:15 PM

## 2013-02-06 NOTE — Anesthesia Postprocedure Evaluation (Signed)
  Anesthesia Post Note  Patient: [name] [name]  Procedure(s) Performed: Procedure(s) (LRB): IRRIGATION AND DEBRIDEMENT RIGHT KNEE (Right)  Anesthesia type: GA  Patient location: PACU  Post pain: Pain level controlled  Post assessment: Post-op Vital signs reviewed  Last Vitals:  Filed Vitals:   02/06/13 1536  BP:   Pulse: 86  Temp: 37.2 C  Resp: 16    Post vital signs: Reviewed  Level of consciousness: sedated  Complications: No apparent anesthesia complications

## 2013-02-06 NOTE — Transfer of Care (Signed)
Immediate Anesthesia Transfer of Care Note  Patient: [name] [name]  Procedure(s) Performed: Procedure(s) (LRB): IRRIGATION AND DEBRIDEMENT RIGHT KNEE (Right)  Patient Location: PACU  Anesthesia Type: General  Level of Consciousness: sedated, patient cooperative and responds to stimulation  Airway & Oxygen Therapy: Patient Spontanous Breathing and Patient connected to face mask oxgen  Post-op Assessment: Report given to PACU RN and Post -op Vital signs reviewed and stable  Post vital signs: Reviewed and stable  Complications: No apparent anesthesia complications

## 2013-02-06 NOTE — Brief Op Note (Signed)
01/28/2013 - 02/06/2013  7:08 PM  PATIENT:  [name] [name]  79 y.o. female  PRE-OPERATIVE DIAGNOSIS:  infected right knee  POST-OPERATIVE DIAGNOSIS:  infected right knee  PROCEDURE:  Procedure(s): IRRIGATION AND DEBRIDEMENT RIGHT KNEE (Right)  SURGEON:  Surgeon(s) and Role:    * Loanne Drilling, MD - Primary  PHYSICIAN ASSISTANT:   ASSISTANTS: Avel Peace, PA-C   ANESTHESIA:   general  EBL:     BLOOD ADMINISTERED:600 CC PRBC  DRAINS: (Medium) Hemovact drain(s) in the right knee with  Suction Open   LOCAL MEDICATIONS USED:  NONE  SPECIMEN:  No Specimen  COUNTS:  YES  TOURNIQUET:   Total Tourniquet Time Documented: Thigh (Right) - 11 minutes Total: Thigh (Right) - 11 minutes   DICTATION: .Other Dictation: Dictation Number [id number]  PLAN OF CARE: Admit to inpatient   PATIENT DISPOSITION:  PACU - hemodynamically stable.

## 2013-02-06 NOTE — Interval H&P Note (Signed)
History and Physical Interval Note:  02/06/2013 5:03 PM  [name] [name]  has presented today for surgery, with the diagnosis of infected right knee  The various methods of treatment have been discussed with the patient and family. After consideration of risks, benefits and other options for treatment, the patient has consented to  Procedure(s): IRRIGATION AND DEBRIDEMENT RIGHT KNEE (Right) as a surgical intervention .  The patient's history has been reviewed, patient examined, no change in status, stable for surgery.  I have reviewed the patient's chart and labs.  Questions were answered to the patient's satisfaction.     Loanne Drilling

## 2013-02-06 NOTE — Social Work (Signed)
Updated [facility] which is prepared to take patient once stable for d/c- per Charge RN patient to have further procedure today- CSW to f/u tomorrow for further planning-  Reece Levy, MSW, [employer] [phone]

## 2013-02-06 NOTE — Progress Notes (Signed)
PT Cancellation Note  Patient Details Name: [name] [name] MRN: [id number] DOB: 1930-10-03   Cancelled Treatment:    Reason Eval/Treat Not Completed: Other (comment) (surgery today)   Rada Hay 02/06/2013, 7:44 AM Blanchard Kelch PT [phone]

## 2013-02-06 NOTE — Progress Notes (Signed)
OT Cancellation Note  Patient Details Name: [name] [name] MRN: [id number] DOB: [birth year]   Cancelled Treatment:    Reason Eval/Treat Not Completed: Other (comment)  Pt is having surgery today.  Will check back tomorrow, if possible.  Camrie Stock 02/06/2013, 9:27 AM Marica Otter, OTR/L [phone] 02/06/2013

## 2013-02-07 ENCOUNTER — Encounter (HOSPITAL_COMMUNITY): Payer: Self-pay | Admitting: Orthopedic Surgery

## 2013-02-07 LAB — BASIC METABOLIC PANEL
BUN: 9 mg/dL (ref 6–23)
CO2: 24 mEq/L (ref 19–32)
Calcium: 8.2 mg/dL — ABNORMAL LOW (ref 8.4–10.5)
Creatinine, Ser: 0.6 mg/dL (ref 0.50–1.10)
GFR calc Af Amer: >90 mL/min (ref >90)
Sodium: 130 mEq/L — ABNORMAL LOW (ref 135–145)

## 2013-02-07 LAB — DIFFERENTIAL
Basophils Absolute: 0 10*3/uL (ref 0.0–0.1)
Basophils Relative: 0 % (ref 0–1)
Blasts: 0 %
Eosinophils Absolute: 0 10*3/uL (ref 0.0–0.7)
Eosinophils Relative: 0 % (ref 0–5)
Monocytes Absolute: 2.7 10*3/uL — ABNORMAL HIGH (ref 0.1–1.0)
Myelocytes: 0 %
Neutro Abs: 26.3 10*3/uL — ABNORMAL HIGH (ref 1.7–7.7)
Neutrophils Relative %: 88 % — ABNORMAL HIGH (ref 43–77)
nRBC: 0 /100 WBC

## 2013-02-07 LAB — CBC
HCT: 28.2 % — ABNORMAL LOW (ref 36.0–46.0)
MCHC: 33.7 g/dL (ref 30.0–36.0)
MCV: 86.8 fL (ref 78.0–100.0)
Platelets: 381 10*3/uL (ref 150–400)
RDW: 17 % — ABNORMAL HIGH (ref 11.5–15.5)
WBC: 29.9 10*3/uL — ABNORMAL HIGH (ref 4.0–10.5)

## 2013-02-07 NOTE — Progress Notes (Signed)
Physical Therapy Treatment Patient Details Name: [name] [name] MRN: [id number] DOB: [birth year] Today's Date: 02/07/2013 Time: 4098-1191 PT Time Calculation (min): 21 min  PT Assessment / Plan / Recommendation  History of Present Illness     PT Comments   Pt reluctant to participate 2* fatigue but willing to sit at bedside for short period.  Pt tolerated 5 minutes at bedside with min guard to Sup.  Follow Up Recommendations  SNF     Does the patient have the potential to tolerate intense rehabilitation     Barriers to Discharge        Equipment Recommendations  Rolling walker with 5" wheels    Recommendations for Other Services OT consult  Frequency Min 5X/week   Progress towards PT Goals Progress towards PT goals: Not progressing toward goals - comment (ltd by pain, lethargy following surgical procedure yesterday)  Plan Current plan remains appropriate    Precautions / Restrictions Precautions Precautions: Fall;Cervical Precaution Comments: aspen collar; KI AAT, no ROM Required Braces or Orthoses: Cervical Brace;Knee Immobilizer - Right Knee Immobilizer - Right: On at all times Cervical Brace: At all times;Hard collar Restrictions Weight Bearing Restrictions: No RLE Weight Bearing: Weight bearing as tolerated   Pertinent Vitals/Pain 8/10; premed, ice pack provided    Mobility  Bed Mobility Bed Mobility: Supine to Sit;Sit to Supine Supine to Sit: 1: +2 Total assist Supine to Sit: Patient Percentage: 20% Sit to Supine: 1: +2 Total assist Sit to Supine: Patient Percentage: 10% Details for Bed Mobility Assistance: Assist for BLEs, to bring trunk to upright and control descent back to supine.  Pads utilized to reposition pt in bed Transfers Transfers: Not assessed Ambulation/Gait Ambulation/Gait Assistance: Not tested (comment)    Exercises     PT Diagnosis:    PT Problem List:   PT Treatment Interventions:     PT Goals (current goals can now be found in the care  plan section) Acute Rehab PT Goals Patient Stated Goal: regain independence and decreased pain PT Goal Formulation: With patient Time For Goal Achievement: 02/12/13 Potential to Achieve Goals: Good  Visit Information  Last PT Received On: 02/07/13 Assistance Needed: +2    Subjective Data  Subjective: I'll sit up if I have to Patient Stated Goal: regain independence and decreased pain   Cognition  Cognition Arousal/Alertness: Awake/alert Behavior During Therapy: WFL for tasks assessed/performed Overall Cognitive Status: Within Functional Limits for tasks assessed    Balance     End of Session PT - End of Session Equipment Utilized During Treatment: Cervical collar;Right knee immobilizer;Gait belt Activity Tolerance: Patient limited by fatigue;Patient limited by pain;Patient limited by lethargy Patient left: in bed;with call bell/phone within reach;with nursing/sitter in room Nurse Communication: Mobility status   GP     [name] [name] 02/07/2013, 3:56 PM

## 2013-02-07 NOTE — Progress Notes (Signed)
[facility]    Date of Admission:  01/28/2013   Total days of antibiotics 10        Day 8 ceftriaxone                   ID: [name] [name] is a 79 y.o. 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  24hr: WBC up to 28K  Medications:  . ALPRAZolam  0.5 mg Oral Daily   And  . ALPRAZolam  1 mg Oral QHS  . cefTRIAXone (ROCEPHIN)  IV  2 g Intravenous Q24H  . docusate sodium  100 mg Oral BID  . esomeprazole  40 mg Oral Daily  . feeding supplement (ENSURE COMPLETE)  237 mL Oral TID WC  . ferrous sulfate  325 mg Oral BID  . fesoterodine  8 mg Oral Daily  . gabapentin  300 mg Oral BID  . levothyroxine  150 mcg Oral QAC breakfast  . simvastatin  20 mg Oral q morning - 10a    Objective: Vital signs in last 24 hours: Temp:  [97.6 F (36.4 C)-101.1 F (38.4 C)] 98.9 F (37.2 C) (11/13 1351) Pulse Rate:  [89-114] 89 (11/13 0954) Resp:  [14-23] 18 (11/13 1351) BP: (123-188)/(51-152) 136/63 mmHg (11/13 1351) SpO2:  [95 %-100 %] 100 % (11/13 1351) Constitutional: oriented to person, place, and time. Numerous facial ecchymosis to left forehead bilateral eyes, in c-collar. Surprisingly in No distress.  HENT: left forehead, sutures from temporal laceration  Mouth/Throat: Oropharynx is clear and moist. No oropharyngeal exudate.  Cardiovascular: Normal rate, regular rhythm and normal heart sounds. Exam reveals no gallop and no friction rub.    No murmur heard.  Pulmonary/Chest: Effort normal and breath sounds normal. No respiratory distress. no wheezes.  Abdominal: Soft. Bowel sounds are normal.exhibits no distension. There is no tenderness.  Lymphadenopathy: no cervical adenopathy. Neck still in c-collar Neurological: alert and oriented to person, place, and time.  Skin: Skin is warm and dry. No rash noted. No erythema.  Ext: right leg wrapped from surgery, Psychiatric: a normal mood and affect. behavior is normal.  Lab Results  Recent Labs  02/06/13 0550 02/06/13 2230 02/07/13 0431  WBC 18.7* 28.8* 29.9*  HGB 8.0* 10.0* 9.5*  HCT 24.4* 29.0* 28.2*  NA 129*  --  130*  K 4.2  --  4.2  CL 94*  --  96  CO2 29  --  24  BUN 14  --  9  CREATININE 0.56  --  0.60    Microbiology: 11/3 wound cx :GBS Studies/Results: No results found.   Assessment/Plan: 79 yo F with TKA in July 2014 c/b post op hematoma s/p I x D who has had poor wound healing and dehiscence. Admitted for management of group  b streptococcal infected prosthetic joint. POD#3   prosthesis resection and antibiotic spacer placement. Had isolated fevers, but blood culture and ua/urine cx are unrevealing but having leukocytosis up to 28K  Leukocytosis: - concern for cdifficile. Will check cdiff pcr  Group b strep PJI  - Continue with ceftriaxone 2gm IV daily for 6 wks   -  For discharge, she will need weekly cbc and cmp to ensure no side effects from long term antibiotics - picc line care per protocol and weekly dressing change - we will have her come back to [facility] for follow up in 1-2 wks ,plus will see her at the conclusion of 6 wks of IV antibiotics on dec 8 or 9th   [name] [name], [facility] Cell: [phone] Pager: [phone]  02/07/2013, 4:37 PM

## 2013-02-07 NOTE — Progress Notes (Signed)
Subjective: 1 Day Post-Op Procedure(s) (LRB): IRRIGATION AND DEBRIDEMENT RIGHT KNEE (Right) Patient reports pain as mild and moderate.   Patient seen in rounds with Dr. Lequita Halt. Patient is having problems with pain in the knee, requiring pain medications We will start therapy today.  Plan is to go Nursing Home after hospital stay.  Objective: Vital signs in last 24 hours: Temp:  [97.6 F (36.4 C)-99 F (37.2 C)] 98.7 F (37.1 C) (11/13 0541) Pulse Rate:  [86-114] 105 (11/13 0541) Resp:  [14-23] 14 (11/13 0541) BP: (114-188)/(51-152) 139/66 mmHg (11/13 0541) SpO2:  [95 %-100 %] 95 % (11/13 0541)  Intake/Output from previous day:  Intake/Output Summary (Last 24 hours) at 02/07/13 0907 Last data filed at 02/07/13 0749  Gross per 24 hour  Intake 3037.34 ml  Output   2152 ml  Net 885.34 ml    Intake/Output this shift: Total I/O In: 240 [P.O.:240] Out: -   Labs:  Recent Labs  02/05/13 0445 02/06/13 0550 02/06/13 2230 02/07/13 0431  HGB 8.5* 8.0* 10.0* 9.5*    Recent Labs  02/06/13 2230 02/07/13 0431  WBC 28.8* 29.9*  RBC 3.35* 3.25*  HCT 29.0* 28.2*  PLT 365 381    Recent Labs  02/06/13 0550 02/07/13 0431  NA 129* 130*  K 4.2 4.2  CL 94* 96  CO2 29 24  BUN 14 9  CREATININE 0.56 0.60  GLUCOSE 114* 135*  CALCIUM 8.3* 8.2*   No results found for this basename: LABPT, INR,  in the last 72 hours  EXAM General - Patient is Alert and Confused but reorients with cueing during exam Extremity - Neurovascular intact Sensation intact distally Dorsiflexion/Plantar flexion intact Dressing - dressing C/D/I and no drainage Motor Function - intact, moving foot and toes well on exam.  Hemovacs are left in place.  One drain is sewn in - DO NOT PULLED  Past Medical History  Diagnosis Date  . Mixed hyperlipidemia   . Hypothyroidism   . GERD (gastroesophageal reflux disease)   . Atrial fibrillation     Remote amiodarone use, recurred in 07/2011 post op from  knee arthroplasty  . Obese   . History of blood transfusion 08/31/2011    A+ type  . History of GI bleed     Gastritis, was on Xarelto and ASA  . Carpal tunnel syndrome   . Arthritis     Knees  . DJD (degenerative joint disease), thoracolumbar   . Rheumatoid arthritis   . H/O bronchitis   . Hemorrhoids   . Measles     as child  . Mumps     as child  . Carotid artery disease   . PONV (postoperative nausea and vomiting)   . Anemia   . Difficulty sleeping   . 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   Weight as of this encounter: 68 kg (149  lb 14.6 oz). Up with therapy Discharge to SNF - First of next week at the earliest.  DVT Prophylaxis - SCD's Weight-Bearing as tolerated to right leg but No Bending or No Motion  WBC trending down over weekend to 19.8 but back up today to 29.9 following repeat surgery likely due to operative stress.  Will continue to follow. Na+ improved back up tp 130 HGB down to 8.5 but typed and crossed and given blood with surgery.  HGB is 9.5 today.  Continue with ceftriaxone 2gm IV daily for 6 weeks are per Dr. Drue Second. PICC line for probably 6 weeks of IV antibiotic therapy.  Culture  ABUNDANT GROUP B STREP(S.AGALACTIAE)ISOLATED    As per Dr. Drue Second: She will need weekly cbc and cmp to ensure no side effects from long term antibiotics We  will have her come back to [facility] for follow up in 1-2 wks ,plus will see her at the conclusion of 6 wks of IV antibiotics on dec 8 or 9th [facility]  Cell: [phone]  Pager: [phone]  [name] [name] 02/07/2013, 9:07 AM

## 2013-02-07 NOTE — Progress Notes (Signed)
OT Cancellation Note  Patient Details Name: [name] [name] MRN: [id number] DOB: [birth year]   Cancelled Treatment:    Reason Eval/Treat Not Completed: Other (comment). Pt s/p I & D on Wednesday.  She would benefit from another day before we see her per conversation with PT.  [place] 02/07/2013, 1:40 PM Marica Otter, OTR/L [phone] 02/07/2013

## 2013-02-08 LAB — CBC WITH DIFFERENTIAL/PLATELET
Basophils Relative: 0 % (ref 0–1)
Eosinophils Absolute: 0.2 10*3/uL (ref 0.0–0.7)
Eosinophils Relative: 1 % (ref 0–5)
HCT: 22.9 % — ABNORMAL LOW (ref 36.0–46.0)
Hemoglobin: 7.4 g/dL — ABNORMAL LOW (ref 12.0–15.0)
Lymphs Abs: 2.6 10*3/uL (ref 0.7–4.0)
MCH: 28.8 pg (ref 26.0–34.0)
MCV: 89.1 fL (ref 78.0–100.0)
Monocytes Absolute: 2.4 10*3/uL — ABNORMAL HIGH (ref 0.1–1.0)
Neutrophils Relative %: 74 % (ref 43–77)
RBC: 2.57 MIL/uL — ABNORMAL LOW (ref 3.87–5.11)
RDW: 17.4 % — ABNORMAL HIGH (ref 11.5–15.5)

## 2013-02-08 LAB — BASIC METABOLIC PANEL
CO2: 29 mEq/L (ref 19–32)
Chloride: 98 mEq/L (ref 96–112)
Glucose, Bld: 102 mg/dL — ABNORMAL HIGH (ref 70–99)
Potassium: 4 mEq/L (ref 3.5–5.1)
Sodium: 132 mEq/L — ABNORMAL LOW (ref 135–145)

## 2013-02-08 LAB — PREPARE RBC (CROSSMATCH)

## 2013-02-08 MED ORDER — ACETAMINOPHEN 325 MG PO TABS
650.0000 mg | ORAL_TABLET | Freq: Once | ORAL | Status: AC
  Administered 2013-02-08: 09:00:00 650 mg via ORAL
  Filled 2013-02-08: qty 2

## 2013-02-08 MED ORDER — FUROSEMIDE 10 MG/ML IJ SOLN
10.0000 mg | Freq: Once | INTRAMUSCULAR | Status: AC
  Administered 2013-02-08: 13:00:00 10 mg via INTRAVENOUS
  Filled 2013-02-08: qty 1

## 2013-02-08 NOTE — Progress Notes (Signed)
Physical Therapy Treatment Patient Details Name: [name] [name] MRN: [id number] DOB: 08/01/1930 Today's Date: 02/08/2013 Time: 4098-1191 PT Time Calculation (min): 29 min  PT Assessment / Plan / Recommendation  History of Present Illness 79 yo female s/p R knee I&D, resection, antibiotic spacer placed. Recent hx of fall (11/1) resulting in multiple cervical fractures requiring Aspen collar at all times.    PT Comments   **Pt received 1st unit of blood this morning. Her activity tolerance has increased. She walked 71' with RW. *  Follow Up Recommendations  SNF     Does the patient have the potential to tolerate intense rehabilitation     Barriers to Discharge        Equipment Recommendations  Rolling walker with 5" wheels    Recommendations for Other Services OT consult  Frequency Min 5X/week   Progress towards PT Goals Progress towards PT goals: Progressing toward goals  Plan Current plan remains appropriate    Precautions / Restrictions Precautions Precautions: Fall;Cervical Precaution Comments: aspen collar; KI AAT, no ROM Required Braces or Orthoses: Cervical Brace;Knee Immobilizer - Right Knee Immobilizer - Right: On at all times Cervical Brace: At all times;Hard collar Restrictions Weight Bearing Restrictions: No RLE Weight Bearing: Weight bearing as tolerated   Pertinent Vitals/Pain **10/10 R knee Ice applied, premedicated*    Mobility  Bed Mobility Bed Mobility: Supine to Sit;Sit to Supine Supine to Sit: 2: Max assist Supine to Sit: Patient Percentage: 50% Details for Bed Mobility Assistance: Assist for BLEs, to bring trunk to upright.  Pads utilized to reposition pt in bed Transfers Transfers: Sit to Stand;Stand to Sit Sit to Stand: 3: Mod assist;From bed Sit to Stand: Patient Percentage: 60% Stand to Sit: 4: Min assist;To chair/3-in-1;With upper extremity assist Stand to Sit: Patient Percentage: 80% Details for Transfer Assistance: Assist to rise, steady  and ensure controlled descent with cues for hand placement, LE management and sequencing with RW.  [name]  Ambulation/Gait Ambulation/Gait Assistance: 4: Min guard Ambulation Distance (Feet): 40 Feet Assistive device: Rolling walker Gait Pattern: Step-to pattern;Decreased stride length;Trunk flexed;Antalgic;Decreased weight shift to right;Decreased step length - right Gait velocity: decreased General Gait Details: VCs to lift head, steady, no LOB, increased time Stairs: No    Exercises Total Joint Exercises Ankle Circles/Pumps: AROM;Both;Supine;20 reps Quad Sets: AROM;Both;Supine;20 reps Hip ABduction/ADduction: AAROM;Right;Supine;20 reps Straight Leg Raises: AAROM;Right;Supine;20 reps   PT Diagnosis:    PT Problem List:   PT Treatment Interventions:     PT Goals (current goals can now be found in the care plan section) Acute Rehab PT Goals Patient Stated Goal: regain independence and decreased pain PT Goal Formulation: With patient Time For Goal Achievement: 02/12/13 Potential to Achieve Goals: Good  Visit Information  Last PT Received On: 02/08/13 Assistance Needed: +1 History of Present Illness: 79 yo female s/p R knee I&D, resection, antibiotic spacer placed. Recent hx of fall (11/1) resulting in multiple cervical fractures requiring Aspen collar at all times.     Subjective Data  Patient Stated Goal: regain independence and decreased pain   Cognition  Cognition Arousal/Alertness: Awake/alert Behavior During Therapy: WFL for tasks assessed/performed Overall Cognitive Status: Within Functional Limits for tasks assessed    Balance     End of Session PT - End of Session Equipment Utilized During Treatment: Cervical collar;Right knee immobilizer;Gait belt Activity Tolerance: Patient tolerated treatment well Patient left: with call bell/phone within reach;with nursing/sitter in room;in chair Nurse Communication: Mobility status   GP     Meredith Staggers,  Isiah Scheel  [place] 02/08/2013, 2:25 PM [phone]

## 2013-02-08 NOTE — Progress Notes (Signed)
Subjective: 2 Days Post-Op Procedure(s) (LRB): IRRIGATION AND DEBRIDEMENT RIGHT KNEE (Right) Patient reports pain as mild.   Patient seen in rounds with Dr. Lequita Halt.  She is feeling a little better today but HGB is low again down to 7.4.  Will give blood today and recheck labs again in AM.   Patient is well, but has had some minor complaints of pain in the knee, requiring pain medications Plan is to go Skilled nursing facility after hospital stay.  Objective: Vital signs in last 24 hours: Temp:  [98.2 F (36.8 C)-101.1 F (38.4 C)] 98.2 F (36.8 C) (11/14 0606) Pulse Rate:  [75-96] 75 (11/14 0606) Resp:  [14-18] 16 (11/14 0606) BP: (115-136)/(63-67) 115/63 mmHg (11/14 0606) SpO2:  [98 %-100 %] 100 % (11/14 0606)  Intake/Output from previous day:  Intake/Output Summary (Last 24 hours) at 02/08/13 0748 Last data filed at 02/08/13 0609  Gross per 24 hour  Intake 1787.92 ml  Output   2398 ml  Net -610.08 ml    Labs:  Recent Labs  02/06/13 0550 02/06/13 2230 02/07/13 0431 02/08/13 0543  HGB 8.0* 10.0* 9.5* 7.4*    Recent Labs  02/07/13 0431 02/08/13 0543  WBC 29.9* 19.8*  RBC 3.25* 2.57*  HCT 28.2* 22.9*  PLT 381 324    Recent Labs  02/07/13 0431 02/08/13 0543  NA 130* 132*  K 4.2 4.0  CL 96 98  CO2 24 29  BUN 9 10  CREATININE 0.60 0.55  GLUCOSE 135* 102*  CALCIUM 8.2* 8.2*   No results found for this basename: LABPT, INR,  in the last 72 hours  EXAM General - Patient is Alert and Appropriate Extremity - Neurovascular intact Sensation intact distally Dressing/Incision - does have some thick purulent drainage from the middle incision which is expressed out, questionable old hematoma still or from the gel foam? placed at time of surgery.  Drains sewn in.  Still patent.  Will leave in place until Monday. Motor Function - intact, moving foot and toes well on exam.   Past Medical History  Diagnosis Date  . Mixed hyperlipidemia   . Hypothyroidism     . GERD (gastroesophageal reflux disease)   . Atrial fibrillation     Remote amiodarone use, recurred in 07/2011 post op from knee arthroplasty  . Obese   . History of blood transfusion 08/31/2011    A+ type  . History of GI bleed     Gastritis, was on Xarelto and ASA  . Carpal tunnel syndrome   . Arthritis     Knees  . DJD (degenerative joint disease), thoracolumbar   . Rheumatoid arthritis   . H/O bronchitis   . Hemorrhoids   . Measles     as child  . Mumps     as child  . Carotid artery disease   . PONV (postoperative nausea and vomiting)   . Anemia   . Difficulty sleeping   . 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   Weight as of this encounter: 68 kg (149 lb 14.6 oz). DVT Prophylaxis - SCD's  Weight-Bearing as tolerated to right leg but No Bending or No Motion  WBC trending down to 19.8 Na+ improved up tp 132 HGB down to 7.4 - Blood today Continue with ceftriaxone 2gm IV daily for 6 weeks are per Dr. Drue Second.  PICC line for probably 6 weeks of IV antibiotic therapy.  Culture  ABUNDANT GROUP B STREP(S.AGALACTIAE)ISOLATED     PERKINS, ALEXZANDREW 02/08/2013, 7:48 AM

## 2013-02-08 NOTE — Progress Notes (Signed)
OT Cancellation Note  Patient Details Name: MYSHA PEELER MRN: [id number] DOB: 11/23/1930   Cancelled Treatment:    Reason Eval/Treat Not Completed: Other (comment) Pt getting blood today. Will check back later time.  Lennox Laity [phone] 02/08/2013, 12:44 PM

## 2013-02-08 NOTE — Progress Notes (Addendum)
[facility]    Date of Admission:  01/28/2013   Total days of antibiotics 11        Day 9 ceftriaxone                   ID: [name] [name] is a 79 y.o. 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  Medications:  . ALPRAZolam  0.5 mg Oral Daily   And  . ALPRAZolam  1 mg Oral QHS  . cefTRIAXone (ROCEPHIN)  IV  2 g Intravenous Q24H  . docusate sodium  100 mg Oral BID  . esomeprazole  40 mg Oral Daily  . feeding supplement (ENSURE COMPLETE)  237 mL Oral TID WC  . ferrous sulfate  325 mg Oral BID  . fesoterodine  8 mg Oral Daily  . gabapentin  300 mg Oral BID  . levothyroxine  150 mcg Oral QAC breakfast  . simvastatin  20 mg Oral q morning - 10a    Objective: Vital signs in last 24 hours: Temp:  [98 F (36.7 C)-98.7 F (37.1 C)] 98.6 F (37 C) (11/14 1605) Pulse Rate:  [75-88] 77 (11/14 1605) Resp:  [14-18] 16 (11/14 1605) BP: (115-156)/(52-68) 137/61 mmHg (11/14 1605) SpO2:  [96 %-100 %] 96 % (11/14 1605) Constitutional: oriented to person, place, and time. Numerous facial ecchymosis to left forehead bilateral eyes, in c-collar. Surprisingly in No distress.  HENT: left forehead, sutures from temporal laceration  Mouth/Throat: Oropharynx is clear and moist. No oropharyngeal exudate.  Cardiovascular: Normal rate, regular rhythm and normal heart sounds. Exam reveals no gallop and no friction  rub.  No murmur heard.  Pulmonary/Chest: Effort normal and breath sounds normal. No respiratory distress. no wheezes.  Abdominal: Soft. Bowel sounds are normal.exhibits no distension. There is no tenderness.  Lymphadenopathy: no cervical adenopathy. Neck still in c-collar Neurological: alert and oriented to person, place, and time.  Skin: Skin is warm and dry. No rash noted. No erythema.  Ext: right leg wrapped from surgery,drains in place. Psychiatric: a normal mood and affect. behavior is normal.  Lab Results  Recent Labs  02/07/13 0431 02/08/13 0543  WBC 29.9* 19.8*  HGB 9.5* 7.4*  HCT 28.2* 22.9*  NA 130* 132*  K 4.2 4.0  CL 96 98  CO2 24 29  BUN 9 10  CREATININE 0.60 0.55    Microbiology: 11/3 wound cx :GBS Studies/Results: No results found.   Assessment/Plan: 79 yo F with TKA in July 2014 c/b post op hematoma s/p I x D who has had poor wound healing and dehiscence. Admitted for management of group  b streptococcal infected prosthetic joint. POD#3   prosthesis resection and antibiotic spacer placement. Had isolated fevers, but blood culture and ua/urine cx are unrevealing but having leukocytosis up to 28K  Leukocytosis: - c.difficile was negative. Leukocytosis improved. If still continues to be elevated concern that she may have bled again into prosthesis  area as part of possible nidus for ongoing infection  Group b strep PJI  - Continue with ceftriaxone 2gm IV daily for 6 wks   -  For discharge, she will need weekly cbc and cmp to ensure no side effects from long term antibiotics - picc line care per protocol and weekly dressing change - we will have her come back to [facility] for follow up in 1-2 wks ,plus will see her at the conclusion of 6 wks of IV antibiotics on dec 8 or 9th   Arvon Schreiner, [facility] Cell: [phone] Pager: [phone]  02/08/2013, 5:00 PM

## 2013-02-08 NOTE — Progress Notes (Signed)
INITIAL NUTRITION ASSESSMENT  DOCUMENTATION CODES Per approved criteria  -Severe malnutrition in the context of chronic illness  Pt meets criteria for Severe MALNUTRITION in the context of Chronic Illness as evidenced by 11% wt loss in less than 4 months, severe muscle and fat wasting, and estimated energy intake <75% of estimated energy needs for >3 months.  INTERVENTION: Continue Ensure Complete TID Encourage PO intake  NUTRITION DIAGNOSIS: Inadequate oral intake related to decreased appetite as evidenced by 11% wt loss.   Goal: Pt to meet >/= 90% of their estimated nutrition needs   Monitor:  PO intake Weight Labs  Reason for Assessment: Length of Stay  79 y.o. female  Admitting Dx: <principal problem not specified>  ASSESSMENT: 79 year old female who comes in for a irrigation and debridement of the right total knee arthroplasty.   Pt is 2 days s/p irrigation and debridement of right knee with postop anemia due to acute blood loss.Pt states she is eating 50% of 3 meals daily and drinking 2-3 Ensure supplements daily. Pt states that her appetite is normal and she is eating well for her; pt states she gets full fast since losing weight. Pt states she used to weigh 180 lbs and lost weight due to decreased appetite/early satiety. Encouraged pt to continue eating 3 meals and drinking Ensure. Weight history shows 11% wt loss in less than 4 months. Weight loss seems to have started after her right total knee arthroplasty July 14th .  Pt with neck brace and right knee immobilizer. Moderate to severe wasting in arms and hands.   Height: Ht Readings from Last 1 Encounters:  01/28/13 5' 5.35" (1.66 m)    Weight: Wt Readings from Last 1 Encounters:  01/28/13 149 lb 14.6 oz (68 kg)    Ideal Body Weight: 125 lbs  % Ideal Body Weight: 119%  Wt Readings from Last 10 Encounters:  01/28/13 149 lb 14.6 oz (68 kg)  01/28/13 149 lb 14.6 oz (68 kg)  01/28/13 149 lb 14.6 oz (68 kg)   01/25/13 150 lb (68.04 kg)  12/12/12 155 lb (70.308 kg)  12/12/12 155 lb (70.308 kg)  11/27/12 155 lb (70.308 kg)  11/13/12 162 lb 3.2 oz (73.573 kg)  11/12/12 162 lb 3.2 oz (73.573 kg)  11/05/12 167 lb 3.2 oz (75.841 kg)    Usual Body Weight: 180 lbs  % Usual Body Weight: 83%  BMI:  Body mass index is 24.68 kg/(m^2). (normal weight)  Estimated Nutritional Needs: Kcal: 1500-1700 Protein: 80-90 grams Fluid: 1.5-1.7 L/day  Skin: +1 RLE edema; right knee incision  Diet Order: General  EDUCATION NEEDS: -No education needs identified at this time   Intake/Output Summary (Last 24 hours) at 02/08/13 1235 Last data filed at 02/08/13 0921  Gross per 24 hour  Intake 2100.42 ml  Output   1958 ml  Net 142.42 ml    Last BM: 11/11   Labs:   Recent Labs Lab 02/06/13 0550 02/07/13 0431 02/08/13 0543  NA 129* 130* 132*  K 4.2 4.2 4.0  CL 94* 96 98  CO2 29 24 29   BUN 14 9 10   CREATININE 0.56 0.60 0.55  CALCIUM 8.3* 8.2* 8.2*  GLUCOSE 114* 135* 102*    CBG (last 3)  No results found for this basename: GLUCAP,  in the last 72 hours  Scheduled Meds: . ALPRAZolam  0.5 mg Oral Daily   And  . ALPRAZolam  1 mg Oral QHS  . cefTRIAXone (ROCEPHIN)  IV  2 g Intravenous Q24H  . docusate sodium  100 mg Oral BID  . esomeprazole  40 mg Oral Daily  . feeding supplement (ENSURE COMPLETE)  237 mL Oral TID WC  . ferrous sulfate  325 mg Oral BID  . fesoterodine  8 mg Oral Daily  . furosemide  10 mg Intravenous Once  . gabapentin  300 mg Oral BID  . levothyroxine  150 mcg Oral QAC breakfast  . simvastatin  20 mg Oral q morning - 10a    Continuous Infusions: . sodium chloride 50 mL/hr at 02/07/13 2057    Past Medical History  Diagnosis Date  . Mixed hyperlipidemia   . Hypothyroidism   . GERD (gastroesophageal reflux disease)   . Atrial fibrillation     Remote amiodarone use, recurred in 07/2011 post op from knee arthroplasty  . Obese   . History of blood transfusion  08/31/2011    A+ type  . History of GI bleed     Gastritis, was on Xarelto and ASA  . Carpal tunnel syndrome   . Arthritis     Knees  . DJD (degenerative joint disease), thoracolumbar   . Rheumatoid arthritis   . H/O bronchitis   . Hemorrhoids   . Measles     as child  . Mumps     as child  . Carotid artery disease   . PONV (postoperative nausea and vomiting)   . Anemia   . Difficulty sleeping   . Multiple bruises     DUE TO FALL 01/24/13    Past Surgical History  Procedure Laterality Date  . Tonsillectomy    . Cholecystectomy  04/29/2005    Dr. Luretha Murphy  . Thoracotomy  4/07    Removal bronchogenic cyst  . Total knee arthroplasty  08/10/2011    Procedure: TOTAL KNEE ARTHROPLASTY;  Surgeon: Loanne Drilling, MD;  Location: [facility];  Service: Orthopedics;  Laterality: Left;  . Rotator cuff repair  1992, 1993    Bilateral  . Tubal ligation      Bilateral  . Ventral hernia repair  05/25/2005    Incarcerated, lysis of adhesions, Dr. Luretha Murphy  . Appendectomy      as child  . Bronchogenic cyst  2007  . Total knee arthroplasty Right 10/08/2012    Procedure: RIGHT TOTAL KNEE ARTHROPLASTY;  Surgeon: Loanne Drilling, MD;  Location: [facility];  Service: Orthopedics;  Laterality: Right;  . Irrigation and debridement knee Right 10/11/2012    Procedure: IRRIGATION AND DEBRIDEMENT KNEE;  Surgeon: Loanne Drilling, MD;  Location: [facility];  Service: Orthopedics;  Laterality: Right;  . Irrigation and debridement knee Right 12/12/2012    Procedure: RIGHT KNEE IRRIGATION AND DEBRIDEMENT, APPLICATION OF WOUND VAC;  Surgeon: Loanne Drilling, MD;  Location: [facility];  Service: Orthopedics;  Laterality: Right;  . Joint replacement    . Irrigation and debridement knee Right 01/28/2013    Procedure: INCISION AND DRAINAGE RIGHT KNEE  RESECTION ARTHROPLASTY ANTIBIOTIC CEMENT SPACER   ;  Surgeon: Loanne Drilling, MD;  Location: [facility];  Service: Orthopedics;  Laterality: Right;  . Irrigation and  debridement knee Right 02/06/2013    Procedure: IRRIGATION AND DEBRIDEMENT RIGHT KNEE;  Surgeon: Loanne Drilling, MD;  Location: [facility];  Service: Orthopedics;  Laterality: Right;    Ian Malkin RD, LDN Inpatient Clinical Dietitian Pager: [phone] After Hours Pager: [phone]

## 2013-02-08 NOTE — Progress Notes (Signed)
CSW continues to follow to assist with d/c planning. [facility] contacted and update provided. SNF feels they should have an opening next week for pt . Pt is looking forward to rehab at [facility] when stable for d/c.  Cori Razor LCSW [phone]

## 2013-02-08 NOTE — Op Note (Signed)
NAMEMORGAINE, [name]                 ACCOUNT NO.:  [id number]  MEDICAL RECORD NO.:  [id number]  LOCATION:  1609                         FACILITY:  [facility]  PHYSICIAN:  Ollen Gross, M.D.    DATE OF BIRTH:  Feb 17, 1931  DATE OF PROCEDURE:  02/06/2013 DATE OF DISCHARGE:                              OPERATIVE REPORT   PREOPERATIVE DIAGNOSIS:  Right knee infection.  POSTOPERATIVE DIAGNOSIS:  Right knee infection.  PROCEDURE:  Irrigation and debridement of right knee.  SURGEON:  Ollen Gross, M.D.  ASSISTANT:  Alexzandrew L. Perkins, P.A.C.  ANESTHESIA:  General.  ESTIMATED BLOOD LOSS:  500 mL.  DRAINS:  Hemovac x2.  TOURNIQUET TIME:  10 minutes at 300 mmHg.  COMPLICATIONS:  None.  CONDITION:  Stable to recovery.  BRIEF CLINICAL NOTE:  [name] [name] is an 79 year old female with very complex history in regard to her right knee.  She basically has had an infected total knee arthroplasty which was removed last week.  She has demonstrated significant hematoma formation on all of her operations. She developed a hematoma again and the wound opened up despite having drains though present for several days.  She presents now for repeat irrigation, debridement, and exploration of the wound to look for any potential bleeding source.  PROCEDURE IN DETAIL:  After successful administration of general anesthetic, tourniquet was placed high on her right thigh and right lower extremity was prepped and draped in the usual sterile fashion. Extremities wrapped in Esmarch, tourniquet inflated to 300 mmHg.  We opened up the remainder of her wound and evacuated a large amount of subcutaneous hematoma.  I then thoroughly irrigated with 3 L of saline solution in the subcu tissue.  We then went to the deep tissue evacuated more hematoma and irrigated with 3 L of saline.  There was no obvious purulence present.  The tissues looked a lot better.  We did the time- out for irrigation and debridement last  week.  I then released the tourniquet to attempt hemostasis.  She had a tremendous amount of friable tissue throughout the joint and subcu.  We packed with sponges and sequentially went throughout the joint achieving as much hemostasis as possible.  I was very satisfied with the joint itself achieving hemostasis.  I did place some thrombin-soaked Gelfoam into the medial and lateral gutters and suprapatellar area.  Once I was satisfied with hemostasis, we further irrigated and then closed the extensor mechanism over Hemovac drain with a V-Loc suture and #1 PDS.  We were satisfied and achieved tight seal as there was absolutely no blood leaking through the deep tissues into the subcu.  We then sequentially achieved hemostasis in the subcu tissues.  I did find 2 small bleeding points and once cauterized those definitely slow down the bleeding.  I was satisfied with the amount of hemostasis and then placed in 2 limbs, a Hemovac drain one in the medial gutter and 1 in the lateral gutter and then we closed the subcu superior and inferior without any tension at all.  There was some tension centrally in the middle aspect of the wound just over the patellar tendon.  However, just with nylon retention  suture came together, the tissue came together very nicely with barely tension on it.  The skin was closed with staples and interrupted 4-0 nylon suture.  Good hemostasis was achieved and there was no bleeding coming through the wound.  The drains were all hooked up to the Hemovac suction.  The incision was cleaned and dried and a bulky sterile dressing applied.  She was then placed into a knee immobilizer, awakened, and transported to recovery in stable condition.     Ollen Gross, M.D.     FA/MEDQ  D:  02/06/2013  T:  02/07/2013  Job:  [id number]

## 2013-02-09 LAB — BASIC METABOLIC PANEL
BUN: 9 mg/dL (ref 6–23)
Chloride: 94 mEq/L — ABNORMAL LOW (ref 96–112)
GFR calc Af Amer: >90 mL/min (ref >90)
GFR calc non Af Amer: 85 mL/min — ABNORMAL LOW (ref >90)
Glucose, Bld: 91 mg/dL (ref 70–99)
Potassium: 4.2 mEq/L (ref 3.5–5.1)
Sodium: 129 mEq/L — ABNORMAL LOW (ref 135–145)

## 2013-02-09 LAB — TYPE AND SCREEN
ABO/RH(D): A POS
Antibody Screen: NEGATIVE
Unit division: 0
Unit division: 0
Unit division: 0
Unit division: 0

## 2013-02-09 NOTE — Progress Notes (Signed)
Physical Therapy Treatment Patient Details Name: [name] [name] MRN: [id number] DOB: [birth year] Today's Date: 02/09/2013 Time: 4098-1191 PT Time Calculation (min): 21 min  PT Assessment / Plan / Recommendation  History of Present Illness 79 yo female s/p R knee I&D, resection, antibiotic spacer placed. Recent hx of fall (11/1) resulting in multiple cervical fractures requiring Aspen collar at all times.    PT Comments   Pt reports PA this am instructed her NWB on R LE.  No orders written but NWB in PA note from this date.  Will follow with same pending clarification by Dr Despina Hick Monday  Follow Up Recommendations  SNF     Does the patient have the potential to tolerate intense rehabilitation     Barriers to Discharge        Equipment Recommendations  Rolling walker with 5" wheels    Recommendations for Other Services OT consult  Frequency Min 5X/week   Progress towards PT Goals Progress towards PT goals: Progressing toward goals  Plan Current plan remains appropriate    Precautions / Restrictions Precautions Precautions: Fall;Cervical Precaution Comments: aspen collar; KI AAT, no ROM Required Braces or Orthoses: Cervical Brace;Knee Immobilizer - Right Knee Immobilizer - Right: On at all times Cervical Brace: At all times;Hard collar Restrictions Weight Bearing Restrictions: Yes RLE Weight Bearing: Non weight bearing (Per pt report and in PA note this date - No order written)   Pertinent Vitals/Pain 7/10; premed, No ice packs permitted inside KI to better maintain knee in extended position    Mobility  Bed Mobility Bed Mobility: Supine to Sit Supine to Sit: 1: +2 Total assist Supine to Sit: Patient Percentage: 40% Details for Bed Mobility Assistance: Assist for BLEs, to bring trunk to upright.  Pads utilized to bring pt around and to bedside Transfers Transfers: Sit to Stand;Stand to Sit Sit to Stand: 1: +2 Total assist;From bed Sit to Stand: Patient Percentage:  60% Stand to Sit: 1: +2 Total assist;To chair/3-in-1 Stand to Sit: Patient Percentage: 60% Stand Pivot Transfers: 1: +2 Total assist Stand Pivot Transfers: Patient Percentage: 70% Details for Transfer Assistance: Assist to rise, steady and ensure controlled descent with cues for hand placement, LE management and sequencing with RW.  [name]  Ambulation/Gait Ambulation/Gait Assistance:  (Transfer bed>chair only 2* new NWB status) Assistive device: Rolling walker    Exercises     PT Diagnosis:    PT Problem List:   PT Treatment Interventions:     PT Goals (current goals can now be found in the care plan section) Acute Rehab PT Goals Patient Stated Goal: regain independence and decreased pain PT Goal Formulation: With patient Time For Goal Achievement: 02/12/13 Potential to Achieve Goals: Good  Visit Information  Last PT Received On: 02/09/13 Assistance Needed: +2 History of Present Illness: 80 yo female s/p R knee I&D, resection, antibiotic spacer placed. Recent hx of fall (11/1) resulting in multiple cervical fractures requiring Aspen collar at all times.     Subjective Data  Subjective: I want to get out of this bed Patient Stated Goal: regain independence and decreased pain   Cognition  Cognition Arousal/Alertness: Awake/alert Behavior During Therapy: WFL for tasks assessed/performed Overall Cognitive Status: Within Functional Limits for tasks assessed    Balance     End of Session PT - End of Session Equipment Utilized During Treatment: Cervical collar;Right knee immobilizer;Gait belt Activity Tolerance: Patient tolerated treatment well Patient left: with call bell/phone within reach;in chair Nurse Communication: Mobility status   GP  Shatera Rennert 02/09/2013, 12:49 PM

## 2013-02-09 NOTE — Progress Notes (Signed)
Subjective: 3 Days Post-Op Procedure(s) (LRB): IRRIGATION AND DEBRIDEMENT RIGHT KNEE (Right) Patient reports pain as Pt states her pain is 6/10 but tolerable.  Pt not bearing weight at this time.  Pain localized to right knee with mild radiation distally into mid lower leg.  Appetite decreased.  Mild SOB. Pt denies N/V/F/C, chest pain, paresthesia bilaterally.    Objective: Vital signs in last 24 hours: Temp:  [98 F (36.7 C)-99.2 F (37.3 C)] 98.8 F (37.1 C) (11/15 0507) Pulse Rate:  [76-82] 77 (11/15 0507) Resp:  [16-18] 16 (11/15 0507) BP: (132-156)/(52-72) 144/67 mmHg (11/15 0507) SpO2:  [96 %-100 %] 96 % (11/14 2100)  Intake/Output from previous day: 11/14 0701 - 11/15 0700 In: 2885.8 [P.O.:900; I.V.:1110.8; Blood:625; IV Piggyback:250] Out: 2930 [Urine:2900; Drains:30] Intake/Output this shift: Total I/O In: 240 [P.O.:240] Out: 400 [Urine:400]   Recent Labs  02/06/13 2230 02/07/13 0431 02/08/13 0543  HGB 10.0* 9.5* 7.4*    Recent Labs  02/07/13 0431 02/08/13 0543  WBC 29.9* 19.8*  RBC 3.25* 2.57*  HCT 28.2* 22.9*  PLT 381 324    Recent Labs  02/08/13 0543 02/09/13 0445  NA 132* 129*  K 4.0 4.2  CL 98 94*  CO2 29 29  BUN 10 9  CREATININE 0.55 0.57  GLUCOSE 102* 91  CALCIUM 8.2* 8.4   No results found for this basename: LABPT, INR,  in the last 72 hours  Pt presents in knee immobilizer, with dressing in place and saturated with blood.  Dressings taken down, copious amount of bloody drainage found on dressings and around incision site.  Incision site is deeply erythematous, edematous, and draining.  Sutures and staples in place.  Mild to moderate TTP to incision site.  Drain sites are well preserved with no purulent discharge noted.  Negative Homan's sign, lower leg compartments soft, DP pulses 2+ bilaterally.  Lower extremities are NVI with good motor control of plantar/dorsiflexion of great toes and feet bilaterally.  ROM and strength of right ankle is  slightly decreased compared to contralateral side, however, right lower leg and foot have normal sensation to light touch and well perfused toes with cap refill <2sec.  No evidence of compartment syndrome or DVT noted on exam.  Assessment/Plan: 3 Days Post-Op Procedure(s) (LRB): IRRIGATION AND DEBRIDEMENT RIGHT KNEE (Right) Continue antibiotics for septic knee Pt NWB Continue current pain and medical management   FLOWERS, CHRISTOPHER S 02/09/2013, 10:30 AM

## 2013-02-10 LAB — CBC WITH DIFFERENTIAL/PLATELET
Basophils Absolute: 0 10*3/uL (ref 0.0–0.1)
Basophils Relative: 0 % (ref 0–1)
Eosinophils Absolute: 0.3 10*3/uL (ref 0.0–0.7)
Eosinophils Relative: 2 % (ref 0–5)
HCT: 30.5 % — ABNORMAL LOW (ref 36.0–46.0)
Hemoglobin: 10.1 g/dL — ABNORMAL LOW (ref 12.0–15.0)
Lymphocytes Relative: 20 % (ref 12–46)
Lymphs Abs: 3 10*3/uL (ref 0.7–4.0)
MCH: 29.6 pg (ref 26.0–34.0)
MCHC: 33.1 g/dL (ref 30.0–36.0)
MCV: 89.4 fL (ref 78.0–100.0)
Monocytes Absolute: 0.6 10*3/uL (ref 0.1–1.0)
Monocytes Relative: 4 % (ref 3–12)
Neutro Abs: 11.1 10*3/uL — ABNORMAL HIGH (ref 1.7–7.7)
Neutrophils Relative %: 74 % (ref 43–77)
Platelets: 392 10*3/uL (ref 150–400)
RBC: 3.41 MIL/uL — ABNORMAL LOW (ref 3.87–5.11)
RDW: 16.4 % — ABNORMAL HIGH (ref 11.5–15.5)
WBC: 15 10*3/uL — ABNORMAL HIGH (ref 4.0–10.5)

## 2013-02-10 LAB — TYPE AND SCREEN
ABO/RH(D): A POS
Antibody Screen: NEGATIVE

## 2013-02-10 LAB — BASIC METABOLIC PANEL
BUN: 7 mg/dL (ref 6–23)
CO2: 28 mEq/L (ref 19–32)
Chloride: 91 mEq/L — ABNORMAL LOW (ref 96–112)
Creatinine, Ser: 0.56 mg/dL (ref 0.50–1.10)
GFR calc Af Amer: >90 mL/min (ref >90)
Potassium: 4 mEq/L (ref 3.5–5.1)
Sodium: 126 mEq/L — ABNORMAL LOW (ref 135–145)

## 2013-02-10 NOTE — Progress Notes (Signed)
Subjective: 4 Days Post-Op Procedure(s) (LRB): IRRIGATION AND DEBRIDEMENT RIGHT KNEE (Right) Patient reports pain as moderate.   Patient is well, and has had no acute complaints or problems other than discomfort in the right knee. No issues overnight. She denies SOB and chest pain. Patient reports that she has had trouble moving the knee but has been trying to "pump her feet".    Objective: Vital signs in last 24 hours: Temp:  [98.8 F (37.1 C)-99.4 F (37.4 C)] 98.8 F (37.1 C) (11/16 0537) Pulse Rate:  [75-83] 75 (11/16 0537) Resp:  [16] 16 (11/16 0537) BP: (133-153)/(63-80) 150/80 mmHg (11/16 0537) SpO2:  [96 %] 96 % (11/16 0537)  Intake/Output from previous day:  Intake/Output Summary (Last 24 hours) at 02/10/13 0822 Last data filed at 02/10/13 0500  Gross per 24 hour  Intake    840 ml  Output   1550 ml  Net   -710 ml     Labs:  Recent Labs  02/08/13 0543  HGB 7.4*    Recent Labs  02/08/13 0543  WBC 19.8*  RBC 2.57*  HCT 22.9*  PLT 324    Recent Labs  02/08/13 0543 02/09/13 0445  NA 132* 129*  K 4.0 4.2  CL 98 94*  CO2 29 29  BUN 10 9  CREATININE 0.55 0.57  GLUCOSE 102* 91  CALCIUM 8.2* 8.4    EXAM General - Patient is Alert and Oriented Extremity - Intact pulses distally Dorsiflexion/Plantar flexion intact Compartment soft Dressing/Incision - blood tinged and serous drainage Motor Function - intact, moving foot and toes well on exam.   Past Medical History  Diagnosis Date  . Mixed hyperlipidemia   . Hypothyroidism   . GERD (gastroesophageal reflux disease)   . Atrial fibrillation     Remote amiodarone use, recurred in 07/2011 post op from knee arthroplasty  . Obese   . History of blood transfusion 08/31/2011    A+ type  . History of GI bleed     Gastritis, was on Xarelto and ASA  . Carpal tunnel syndrome   . Arthritis     Knees  . DJD (degenerative joint disease), thoracolumbar   . Rheumatoid arthritis   . H/O bronchitis   .  Hemorrhoids   . Measles     as child  . Mumps     as child  . Carotid artery disease   . PONV (postoperative nausea and vomiting)   . Anemia   . Difficulty sleeping   . 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   Weight as of this encounter: 68 kg (149 lb 14.6 oz). Advance diet  Weight-Bearing as tolerated but no bending or motion   Per Avel Peace, PA-C, the patient is WBAT but no motion of the right knee. I did discuss with the patient that we do not want her trying to move  the knee at this time. Continue with daily dressing changes. No specific instructions from [facility] to remove hemovac so they were not removed. However, per nursing staff, hemovac is difficult to keep charge at this point. Will continue to monitor daily and DC to SNF when stable.   Breyton Vanscyoc LAUREN 02/10/2013, 8:22 AM

## 2013-02-10 NOTE — Progress Notes (Signed)
Chaplain visit on referral. [name] [name] [name] (82) is upset at her current health situation. She regrets having surgery feeling she would have been better off without it. She has no clear vision of where she is and where she is going physically. She is aware of what has happened, but gets confused as to how long she has been hospitalized. Her spiritual pain is severe stemming from feelings of hopelessness.  [name] [name] is visually oriented rather than auditory. She has asked that physicians give her something in writing telling her what is happening currently (she has a spacer in her knee and cannot stand up), what the goals are in the near future and an estimate of when she will be able to walk again (a month, a year, never). This short written statement will allow her to read and reread what is going on. This may not manifest any less outward complaining, but will calm her down.  We talked about her not eating, and concluded that she will need the strength she gains by eating food when the time comes to walk again.   Her family lives in [place] and cannot always be with her. This gives her the feeling of isolation and fear of abandonment.   Her goal is to "be healed her in the hospital", go to rehab and learn to take care of herself again, and then go home. Given she cannot envision a timetable for this, she has become frustrated and confused.   Despite [name] [name]'s gruff manners, she is a sensitive person. She responds well to staff that lovingly assist her forward through this ordeal.  Prayer and grief counsel given. Chaplain passed to the nurse through a consultation visit [name] [name]'s concerns, and expectations. It is clear that the nursing staff is giving exceptional patient care to [name] [name].  RECOMMEND Chaplain visits to make a fuller spiritual assessment and plain of care. Also recommend continued SW follow ups to assist [name] [name] to envision her future and what care she will be  provided.  Benjie Karvonen. Dolorez Jeffrey, DMin Chaplain

## 2013-02-11 ENCOUNTER — Inpatient Hospital Stay (HOSPITAL_COMMUNITY): Payer: Medicare Other

## 2013-02-11 ENCOUNTER — Inpatient Hospital Stay: Payer: Medicare Other | Admitting: Internal Medicine

## 2013-02-11 LAB — BASIC METABOLIC PANEL
CO2: 30 mEq/L (ref 19–32)
Chloride: 94 mEq/L — ABNORMAL LOW (ref 96–112)
GFR calc Af Amer: >90 mL/min (ref >90)
Potassium: 4.1 mEq/L (ref 3.5–5.1)
Sodium: 132 mEq/L — ABNORMAL LOW (ref 135–145)

## 2013-02-11 LAB — CULTURE, BLOOD (ROUTINE X 2): Culture: NO GROWTH

## 2013-02-11 LAB — CBC
HCT: 32.3 % — ABNORMAL LOW (ref 36.0–46.0)
Hemoglobin: 10.7 g/dL — ABNORMAL LOW (ref 12.0–15.0)
RBC: 3.58 MIL/uL — ABNORMAL LOW (ref 3.87–5.11)
RDW: 16.3 % — ABNORMAL HIGH (ref 11.5–15.5)
WBC: 15.6 10*3/uL — ABNORMAL HIGH (ref 4.0–10.5)

## 2013-02-11 MED ORDER — INFLUENZA VAC SPLIT QUAD 0.5 ML IM SUSP
0.5000 mL | INTRAMUSCULAR | Status: DC | PRN
  Filled 2013-02-11: qty 0.5

## 2013-02-11 NOTE — Progress Notes (Signed)
Subjective: 5 Days Post-Op Procedure(s) (LRB): IRRIGATION AND DEBRIDEMENT RIGHT KNEE (Right) Patient reports pain as mild.   Patient seen in rounds by Dr. Lequita [name]. No major complaints. Patient is well, and has had no acute complaints or problems Plan is to go Skilled nursing facility after hospital stay.  Objective: Vital signs in last 24 hours: Temp:  [98.5 F (36.9 C)-99.6 F (37.6 C)] 98.6 F (37 C) (11/17 0454) Pulse Rate:  [73-88] 73 (11/17 0454) Resp:  [16-17] 16 (11/17 0454) BP: (160-165)/(70-83) 160/82 mmHg (11/17 0454) SpO2:  [93 %-98 %] 98 % (11/17 0454)  Intake/Output from previous day:  Intake/Output Summary (Last 24 hours) at 02/11/13 0702 Last data filed at 02/11/13 0455  Gross per 24 hour  Intake   1440 ml  Output   3700 ml  Net  -2260 ml     Labs:  Recent Labs  02/10/13 0730  HGB 10.1*    Recent Labs  02/10/13 0730  WBC 15.0*  RBC 3.41*  HCT 30.5*  PLT 392    Recent Labs  02/10/13 0833 02/11/13 0410  NA 126* 132*  K 4.0 4.1  CL 91* 94*  CO2 28 30  BUN 7 7  CREATININE 0.56 0.55  GLUCOSE 125* 103*  CALCIUM 8.5 8.5   No results found for this basename: LABPT, INR,  in the last 72 hours  EXAM General - Patient is Alert and Appropriate Extremity - Neurovascular intact Sensation intact distally Dressing/Incision - clean, still with some scant bleeding from incision but much less than last week. Motor Function - intact, moving foot and toes well on exam.   Past Medical History  Diagnosis Date  . Mixed hyperlipidemia   . Hypothyroidism   . GERD (gastroesophageal reflux disease)   . Atrial fibrillation     Remote amiodarone use, recurred in 07/2011 post op from knee arthroplasty  . Obese   . History of blood transfusion 08/31/2011    A+ type  . History of GI bleed     Gastritis, was on Xarelto and ASA  . Carpal tunnel syndrome   . Arthritis     Knees  . DJD (degenerative joint disease), thoracolumbar   . Rheumatoid arthritis    . H/O bronchitis   . Hemorrhoids   . Measles     as child  . Mumps     as child  . Carotid artery disease   . PONV (postoperative nausea and vomiting)   . Anemia   . Difficulty sleeping   . 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   Weight as of this encounter: 68 kg (149 lb 14.6 oz). Continue IV ABX via PICC  DVT Prophylaxis - SCD's Weight-Bearing as tolerated to right leg but No Bending or No Motion  WBC pending, CBC has been ordered Na+ improved up tp 132  HGB pending, CBC has been  ordered Continue with ceftriaxone 2gm IV daily for 6 weeks are per Dr. Drue [name].  PICC line for probably 6 weeks of IV antibiotic therapy.  Culture  ABUNDANT GROUP B STREP(S.AGALACTIAE)ISOLATED    [name], [name] 02/11/2013, 7:02 AM

## 2013-02-11 NOTE — Clinical Documentation Improvement (Addendum)
THIS DOCUMENT IS NOT A PERMANENT PART OF THE MEDICAL RECORD  Please update your documentation with the medical record to reflect your response to this query. If you need help knowing how to do this please call [phone].  02/11/13   Dear  Joice Lofts Tamala Ser, PA-C/ Associates,  In a better effort to capture your patient's severity of illness, reflect appropriate length of stay and utilization of resources, a review of the patient medical record has revealed the following indicators.    Based on your clinical judgment, please clarify and document in a progress note and/or discharge summary the clinical condition associated with the following supporting information:  In responding to this query please exercise your independent judgment.  The fact that a query is asked, does not imply that any particular answer is desired or expected.  Pt with severe malnutrition per RD note on 02/10/13  Clarification Needed   Please clarify if you agree pt with severe malnutrition or other diagnosis and document in pn or d/c summary   Possible Clinical Conditions?   _______Moderate Malnutrition _______Severe Malnutrition   _______Protein Calorie Malnutrition _______Severe Protein Calorie Malnutrition   _______Other Condition________________ _______Cannot clinically determine     Supporting Information: Risk Factors: Nutritional assessment 02/08/13 Pt meets criteria for Severe MALNUTRITION in the context of Chronic Illness as evidenced by 11% wt loss in less than 4 months, severe muscle and fat wasting, and estimated energy intake <75% of estimated energy needs for >3 months.   Signs & Symptoms: 01/28/13 5' 5.35" (1.66 m)  01/28/13 149 lb 14.6 oz (68 kg)    BMI: Body mass index is 24.68 kg/(m^2).    Diagnostics:   Treatment  Continue Ensure Complete TID  Encourage PO intake    Nutrition Consult   You may use possible, probable, or suspect with inpatient documentation.  possible, probable, suspected diagnoses MUST be documented at the time of discharge  Reviewed:  no additional documentation provided provided by myself.   Please send CDI query to Avel Peace, PA-C for further consideration. Patient was only send by myself in rounds on the weekend. Not following the patient regularly.   Thank You,  Enis Slipper  RN, BSN, MSN/Inf, CCDS Clinical Documentation Specialist [facility] Dept Pager: [phone] / E-mail: [email or web address] [phone] Health Information Management [place]  This was completed on this chart. Avel Peace, [facility]

## 2013-02-11 NOTE — Progress Notes (Signed)
Physical Therapy Treatment Patient Details Name: [name] [name] MRN: [id number] DOB: January 06, 1931 Today's Date: 02/11/2013 Time: 7829-5621 PT Time Calculation (min): 26 min  PT Assessment / Plan / Recommendation  History of Present Illness 79 yo female s/p R knee I&D, resection, antibiotic spacer placed. Recent hx of fall (11/1) resulting in multiple cervical fractures requiring Aspen collar at all times.    PT Comments   Assisted pt from recliner to [facility] to void then a few steps to bed.  Instructed pt on WBAT and pt knew KI at all times.  Performed R LE TE's with brace on.    Follow Up Recommendations  SNF     Does the patient have the potential to tolerate intense rehabilitation     Barriers to Discharge        Equipment Recommendations       Recommendations for Other Services    Frequency Min 5X/week   Progress towards PT Goals Progress towards PT goals: Progressing toward goals  Plan Current plan remains appropriate    Precautions / Restrictions Precautions Precautions: Fall;Cervical Precaution Comments: aspen collar; KI AAT, no ROM Required Braces or Orthoses: Cervical Brace;Knee Immobilizer - Right Knee Immobilizer - Right: On at all times Cervical Brace: At all times;Hard collar Restrictions Weight Bearing Restrictions: No RLE Weight Bearing: Weight bearing as tolerated    Pertinent Vitals/Pain C/o 6/10 knee pain meds requested    Mobility  Bed Mobility Bed Mobility: Sit to Supine Sit to Supine: 2: Max assist Details for Bed Mobility Assistance: Max assist to transfer pt back to bed with increased time to posiyion to comfort Transfers Transfers: Sit to Stand;Stand to Sit Sit to Stand: From chair/3-in-1;From toilet;2: Max assist Stand to Sit: 2: Max assist;To bed;To toilet Details for Transfer Assistance: 50% VC's on proper hand placement and increased time to pivot/weight shift.  Assisted pt from recliner to [facility] then back to  bed. Ambulation/Gait Ambulation/Gait Assistance: 2: Max assist Ambulation Distance (Feet): 2 Feet Assistive device: Rolling walker Ambulation/Gait Assistance Details: Assisted pt from [facility] back to bed with a few side steps and backward gait with 50% VC's on proper sequencing, WBAT and safety. Gait Pattern: Step-to pattern;Decreased stride length;Trunk flexed;Antalgic;Decreased weight shift to right;Decreased step length - right Gait velocity: decreased    Exercises   Total Knee Replacement TE's With KI on 10 reps B LE ankle pumps 10 reps knee presses 10 reps SLR's 10 reps ABD      PT Goals (current goals can now be found in the care plan section)    Visit Information  Last PT Received On: 02/11/13 Assistance Needed: +2 History of Present Illness: 79 yo female s/p R knee I&D, resection, antibiotic spacer placed. Recent hx of fall (11/1) resulting in multiple cervical fractures requiring Aspen collar at all times.     Subjective Data      Cognition       Balance     End of Session PT - End of Session Equipment Utilized During Treatment: Cervical collar;Right knee immobilizer;Gait belt Activity Tolerance: Patient tolerated treatment well Patient left: with call bell/phone within reach;in chair;in bed   Felecia Shelling  PTA [facility]  Acute  Rehab Pager      [phone]

## 2013-02-11 NOTE — Progress Notes (Signed)
CSW continues to follow to assist with d/c planning. PN's sent to [facility] today to provide update. Message left at SNF to see if bed will still be available for pt at d/c. Will update pt once call is returned.  Cori Razor LCSW [phone]

## 2013-02-12 LAB — CBC
HCT: 29.4 % — ABNORMAL LOW (ref 36.0–46.0)
Hemoglobin: 9.7 g/dL — ABNORMAL LOW (ref 12.0–15.0)
MCHC: 33 g/dL (ref 30.0–36.0)
MCV: 89.9 fL (ref 78.0–100.0)
RBC: 3.27 MIL/uL — ABNORMAL LOW (ref 3.87–5.11)

## 2013-02-12 LAB — BASIC METABOLIC PANEL
BUN: 7 mg/dL (ref 6–23)
CO2: 28 mEq/L (ref 19–32)
Chloride: 93 mEq/L — ABNORMAL LOW (ref 96–112)
GFR calc Af Amer: >90 mL/min (ref >90)
GFR calc non Af Amer: 83 mL/min — ABNORMAL LOW (ref >90)
Potassium: 4.2 mEq/L (ref 3.5–5.1)

## 2013-02-12 NOTE — Progress Notes (Signed)
Subjective: 6 Days Post-Op Procedure(s) (LRB): IRRIGATION AND DEBRIDEMENT RIGHT KNEE (Right) Patient reports pain as mild.   Patient seen in rounds with Dr. Lequita Halt. Patient is well, but has had some minor complaints of pain in the knee, requiring pain medications Plan is to go Skilled nursing facility after hospital stay.  Objective: Vital signs in last 24 hours: Temp:  [98.6 F (37 C)-99.6 F (37.6 C)] 98.6 F (37 C) (11/18 0511) Pulse Rate:  [76-89] 89 (11/18 0511) Resp:  [16] 16 (11/18 0511) BP: (142-171)/(61-75) 143/75 mmHg (11/18 0511) SpO2:  [96 %-97 %] 96 % (11/18 0511)  Intake/Output from previous day:  Intake/Output Summary (Last 24 hours) at 02/12/13 0845 Last data filed at 02/12/13 0659  Gross per 24 hour  Intake   1061 ml  Output   2000 ml  Net   -939 ml    Intake/Output this shift:    Labs:  Recent Labs  02/10/13 0730 02/11/13 0800 02/12/13 0555  HGB 10.1* 10.7* 9.7*    Recent Labs  02/11/13 0800 02/12/13 0555  WBC 15.6* 15.4*  RBC 3.58* 3.27*  HCT 32.3* 29.4*  PLT 472* 455*    Recent Labs  02/11/13 0410 02/12/13 0555  NA 132* 128*  K 4.1 4.2  CL 94* 93*  CO2 30 28  BUN 7 7  CREATININE 0.55 0.61  GLUCOSE 103* 95  CALCIUM 8.5 8.5   No results found for this basename: LABPT, INR,  in the last 72 hours  EXAM General - Patient is Alert and Appropriate Extremity - Neurovascular intact Sensation intact distally Dorsiflexion/Plantar flexion intact Dressing/Incision - dressing changed and small area of wound opened up in the middle portion of incision.  No active bleeding.  No wound necrosis noted. Motor Function - intact, moving foot and toes well on exam.   Past Medical History  Diagnosis Date  . Mixed hyperlipidemia   . Hypothyroidism   . GERD (gastroesophageal reflux disease)   . Atrial fibrillation     Remote amiodarone use, recurred in 07/2011 post op from knee arthroplasty  . Obese   . History of blood transfusion  08/31/2011    A+ type  . History of GI bleed     Gastritis, was on Xarelto and ASA  . Carpal tunnel syndrome   . Arthritis     Knees  . DJD (degenerative joint disease), thoracolumbar   . Rheumatoid arthritis   . H/O bronchitis   . Hemorrhoids   . Measles     as child  . Mumps     as child  . Carotid artery disease   . PONV (postoperative nausea and vomiting)   . Anemia   . Difficulty sleeping   . 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   Weight as of this encounter: 68 kg (149 lb 14.6 oz). Up with therapy DVT Prophylaxis -  SCD's  Weight-Bearing as tolerated to right leg but No Bending or No Motion  WBC stable at 15.4 Na+ dropped to 128.  Continue to monitor and recheck. HGB down a little to 9.7. Patient is asymptomatic at this time. Continue with ceftriaxone 2gm IV daily for 6 weeks are per Dr. Drue Second.  PICC line for probably 6 weeks of IV antibiotic therapy.  Culture  ABUNDANT GROUP B STREP(S.AGALACTIAE)ISOLATED    Changed to wet to dry dressing changes today.  This morning's dressing has been done.  Will do Twice A Day wet to dry dressings at this time. Dr. Lequita Halt switched the patient out from the hard collar to a soft collar.  Wear at all times except may lossen up a little while in bed supported.  [name] [name], [name] [name] 02/12/2013, 8:45 AM

## 2013-02-13 LAB — CBC
HCT: 30 % — ABNORMAL LOW (ref 36.0–46.0)
Hemoglobin: 9.8 g/dL — ABNORMAL LOW (ref 12.0–15.0)
MCH: 29.6 pg (ref 26.0–34.0)
MCHC: 32.7 g/dL (ref 30.0–36.0)

## 2013-02-13 LAB — BASIC METABOLIC PANEL
BUN: 9 mg/dL (ref 6–23)
CO2: 27 mEq/L (ref 19–32)
Chloride: 90 mEq/L — ABNORMAL LOW (ref 96–112)
GFR calc non Af Amer: 84 mL/min — ABNORMAL LOW (ref >90)
Glucose, Bld: 94 mg/dL (ref 70–99)
Potassium: 4 mEq/L (ref 3.5–5.1)
Sodium: 125 mEq/L — ABNORMAL LOW (ref 135–145)

## 2013-02-13 NOTE — Progress Notes (Signed)
Occupational Therapy Treatment and goal update Patient Details Name: [name] [name] MRN: [id number] DOB: 1930-06-14 Today's Date: 02/13/2013 Time: 5621-3086 OT Time Calculation (min): 27 min  OT Assessment / Plan / Recommendation  History of present illness 79 yo female s/p R knee I&D, resection, antibiotic spacer placed. Recent hx of fall (11/1) resulting in multiple cervical fractures requiring Aspen collar at all times.    OT comments  Pt progressing well but A x 2 still needed for safety with transfers.  Follow Up Recommendations  SNF    Barriers to Discharge       Equipment Recommendations  3 in 1 bedside comode    Recommendations for Other Services    Frequency Min 2X/week   Progress towards OT Goals Progress towards OT goals: Goals met and updated - see care plan  Plan Discharge plan remains appropriate    Precautions / Restrictions Precautions Precautions: Fall;Cervical Precaution Comments: soft collar, KI AAT NO ROM Knee Immobilizer - Right: On at all times Cervical Brace: At all times (soft collar) Restrictions RLE Weight Bearing: Weight bearing as tolerated   Pertinent Vitals/Pain No c/o pain    ADL  Upper Body Bathing: Set up Where Assessed - Upper Body Bathing: Unsupported sitting Toilet Transfer: +2 Total assistance Toilet Transfer: Patient Percentage: 50% Toilet Transfer Method: Sit to stand Toilet Transfer Equipment: Bedside commode Toileting - Clothing Manipulation and Hygiene: Maximal assistance Where Assessed - Engineer, mining and Hygiene: Sit to stand from 3-in-1 or toilet Transfers/Ambulation Related to ADLs: spt to 3:1 then ambulated 2 feet to chair:  has difficulty moving LLE back and sat prematurely.  A x 2 for safety.  Pt very fearful of falling ADL Comments: performed UB bathing with set up.  Stood 2 minutes with min A for hygiene. Pt able to lift bil UEs to 90 now and performed 5 reps    OT Diagnosis:    OT Problem List:   OT  Treatment Interventions:     OT Goals(current goals can now be found in the care plan section)goals met or progressing/updated today Acute Rehab OT Goals OT Goal Formulation: With patient Time For Goal Achievement: 02/27/13 Potential to Achieve Goals: Good ADL Goals Pt Will Transfer to Toilet: with +2 assist;bedside commode;stand pivot transfer (pt 75%) Pt Will Perform Toileting - Clothing Manipulation and hygiene: with mod assist;sit to/from stand Additional ADL Goal #1: pt will stand for 2 minutes with min guard for adls  Visit Information      Subjective Data      Prior Functioning       Cognition  Cognition Arousal/Alertness: Awake/alert Behavior During Therapy: WFL for tasks assessed/performed Overall Cognitive Status: Within Functional Limits for tasks assessed    Mobility  Bed Mobility Supine to Sit: 3: Mod assist;With rails;HOB elevated Details for Bed Mobility Assistance: assist for bil LEs Transfers Sit to Stand: From bed;1: +2 Total assist;From chair/3-in-1 Sit to Stand: Patient Percentage: 50% Details for Transfer Assistance: mod cues for LE and UE placement    Exercises      Balance     End of Session OT - End of Session Activity Tolerance: Patient tolerated treatment well Patient left: in chair;with call bell/phone within reach  GO     [facility] 02/13/2013, 8:44 AM Marica Otter, OTR/L [phone] 02/13/2013

## 2013-02-13 NOTE — Progress Notes (Signed)
02/13/13 1300  PT Visit Information  Last PT Received On 02/13/13  Reason Eval/Treat Not Completed Pain limiting ability to participate

## 2013-02-13 NOTE — Progress Notes (Signed)
Subjective: 7 Days Post-Op Procedure(s) (LRB): IRRIGATION AND DEBRIDEMENT RIGHT KNEE (Right) Patient reports pain as moderate.   Patient seen in rounds with Dr. Lequita Halt.  Dressing was changed during visit.  She states that she is having a lot of problems with walknig and weight bearing on the leg. Patient is having problems with pain in the knee, requiring pain medications Plan is to go Skilled nursing facility after hospital stay.  Objective: Vital signs in last 24 hours: Temp:  [99.8 F (37.7 C)] 99.8 F (37.7 C) (11/19 0605) Pulse Rate:  [77-86] 86 (11/19 1033) Resp:  [16] 16 (11/19 1033) BP: (145-156)/(69-71) 148/70 mmHg (11/19 1033) SpO2:  [94 %-95 %] 94 % (11/19 1033)  Intake/Output from previous day:  Intake/Output Summary (Last 24 hours) at 02/13/13 1351 Last data filed at 02/13/13 1050  Gross per 24 hour  Intake 1184.33 ml  Output   2175 ml  Net -990.67 ml    Intake/Output this shift: Total I/O In: 320 [P.O.:240; I.V.:80] Out: 850 [Urine:850]  Labs:  Recent Labs  02/11/13 0800 02/12/13 0555 02/13/13 0350  HGB 10.7* 9.7* 9.8*    Recent Labs  02/12/13 0555 02/13/13 0350  WBC 15.4* 15.8*  RBC 3.27* 3.31*  HCT 29.4* 30.0*  PLT 455* 505*    Recent Labs  02/12/13 0555 02/13/13 0350  NA 128* 125*  K 4.2 4.0  CL 93* 90*  CO2 28 27  BUN 7 9  CREATININE 0.61 0.60  GLUCOSE 95 94  CALCIUM 8.5 8.5   No results found for this basename: LABPT, INR,  in the last 72 hours  EXAM General - Patient is Alert and Appropriate Extremity - Neurovascular intact Sensation intact distally Dorsiflexion/Plantar flexion intact Dressing/Incision - dressing changed and middle area of incision opened up in the middle portion of incision. No active bleeding.  Motor Function - intact, moving foot and toes well on exam.   Past Medical History  Diagnosis Date  . Mixed hyperlipidemia   . Hypothyroidism   . GERD (gastroesophageal reflux disease)   . Atrial  fibrillation     Remote amiodarone use, recurred in 07/2011 post op from knee arthroplasty  . Obese   . History of blood transfusion 08/31/2011    A+ type  . History of GI bleed     Gastritis, was on Xarelto and ASA  . Carpal tunnel syndrome   . Arthritis     Knees  . DJD (degenerative joint disease), thoracolumbar   . Rheumatoid arthritis   . H/O bronchitis   . Hemorrhoids   . Measles     as child  . Mumps     as child  . Carotid artery disease   . PONV (postoperative nausea and vomiting)   . Anemia   . Difficulty sleeping   . 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   Weight as  of this encounter: 68 kg (149 lb 14.6 oz). Up with therapy DVT Prophylaxis - SCD's  Weight-Bearing as tolerated to right leg but No Bending or No Motion  WBC stable at 15.8 Na+ dropped to 125. Continue to monitor and recheck.  HGB stable at 9.8. Patient is asymptomatic at this time.  Continue with ceftriaxone 2gm IV daily for 6 weeks are per Dr. Drue Second.  PICC line for probably 6 weeks of IV antibiotic therapy.  Culture  ABUNDANT GROUP B STREP(S.AGALACTIAE)ISOLATED   Changed to wet to dry dressing changes yesterday. This morning's dressing has been done by Dr. Lequita Halt. Continue Twice A Day wet to dry dressings at this time.  Dr. Lequita Halt switched the patient out from the hard collar to a soft  collar. Wear at all times except may lossen up a little while in bed supported. Spoke with Dr. Drue Second.  Will get repeat cultures in any new or significant drainage or if she goes by to surgery for any further debridement.  Dartanyan Deasis 02/13/2013, 1:51 PM    \

## 2013-02-14 LAB — BASIC METABOLIC PANEL WITH GFR
BUN: 10 mg/dL (ref 6–23)
CO2: 28 meq/L (ref 19–32)
Calcium: 8.4 mg/dL (ref 8.4–10.5)
Chloride: 93 meq/L — ABNORMAL LOW (ref 96–112)
Creatinine, Ser: 0.58 mg/dL (ref 0.50–1.10)
GFR calc Af Amer: >90 mL/min (ref >90)
GFR calc non Af Amer: 85 mL/min — ABNORMAL LOW (ref >90)
Glucose, Bld: 91 mg/dL (ref 70–99)
Potassium: 4.1 meq/L (ref 3.5–5.1)
Sodium: 130 meq/L — ABNORMAL LOW (ref 135–145)

## 2013-02-14 LAB — CBC
HCT: 28.2 % — ABNORMAL LOW (ref 36.0–46.0)
Hemoglobin: 9 g/dL — ABNORMAL LOW (ref 12.0–15.0)
MCH: 29 pg (ref 26.0–34.0)
MCHC: 31.9 g/dL (ref 30.0–36.0)
MCV: 91 fL (ref 78.0–100.0)
Platelets: 443 K/uL — ABNORMAL HIGH (ref 150–400)
RBC: 3.1 MIL/uL — ABNORMAL LOW (ref 3.87–5.11)
RDW: 16.4 % — ABNORMAL HIGH (ref 11.5–15.5)
WBC: 12.6 K/uL — ABNORMAL HIGH (ref 4.0–10.5)

## 2013-02-14 MED ORDER — BOOST / RESOURCE BREEZE PO LIQD
1.0000 | ORAL | Status: DC
  Administered 2013-02-14 – 2013-02-16 (×2): 1 via ORAL

## 2013-02-14 MED ORDER — ADULT MULTIVITAMIN W/MINERALS CH
1.0000 | ORAL_TABLET | Freq: Every day | ORAL | Status: DC
  Administered 2013-02-14 – 2013-03-05 (×19): 1 via ORAL
  Filled 2013-02-14 (×20): qty 1

## 2013-02-14 NOTE — Progress Notes (Signed)
NUTRITION FOLLOW UP  Intervention:   Continue Ensure Complete TID Provide Resource Breeze once daily Provide Multivitamin with minerals daily  Nutrition Dx:   Inadequate oral intake related to decreased appetite as evidenced by 11% wt loss; ongoing, varied po intake 0-75% of most meals  Goal:   Pt to meet >/= 90% of their estimated nutrition needs; not likely met  Monitor:   PO intake; 0-75% most meals, Ensure supplements 2-3 times daily  Assessment:   Pt states that she was eating well but, recently she has had nausea and no appetite. Per nursing notes, pt is eating 0-100% of meals, 0-75% of most meals. Pt reports nausea and vomiting. Phenegran and Zofran ordered PRN.  Encouraged pt to continue PO intake as tolerated. Encouraged protein-rich foods and continued use of Ensure supplements.   Height: Ht Readings from Last 1 Encounters:  01/28/13 5' 5.35" (1.66 m)    Weight Status:   Wt Readings from Last 1 Encounters:  01/28/13 149 lb 14.6 oz (68 kg)    Re-estimated needs:  Kcal: 1500-1700  Protein: 80-90 grams  Fluid: 1.5-1.7 L/day  Skin: right knee incision, laceration on face; +2 RLE edema  Diet Order: General   Intake/Output Summary (Last 24 hours) at 02/14/13 1547 Last data filed at 02/14/13 0448  Gross per 24 hour  Intake    520 ml  Output   1550 ml  Net  -1030 ml    Last BM: 11/17   Labs:   Recent Labs Lab 02/12/13 0555 02/13/13 0350 02/14/13 0352  NA 128* 125* 130*  K 4.2 4.0 4.1  CL 93* 90* 93*  CO2 28 27 28   BUN 7 9 10   CREATININE 0.61 0.60 0.58  CALCIUM 8.5 8.5 8.4  GLUCOSE 95 94 91    CBG (last 3)  No results found for this basename: GLUCAP,  in the last 72 hours  Scheduled Meds: . ALPRAZolam  0.5 mg Oral Daily   And  . ALPRAZolam  1 mg Oral QHS  . cefTRIAXone (ROCEPHIN)  IV  2 g Intravenous Q24H  . docusate sodium  100 mg Oral BID  . esomeprazole  40 mg Oral Daily  . feeding supplement (ENSURE COMPLETE)  237 mL Oral TID WC  .  ferrous sulfate  325 mg Oral BID  . fesoterodine  8 mg Oral Daily  . gabapentin  300 mg Oral BID  . levothyroxine  150 mcg Oral QAC breakfast  . simvastatin  20 mg Oral q morning - 10a    Continuous Infusions: . sodium chloride 20 mL/hr at 02/13/13 0542    Ian Malkin RD, LDN Inpatient Clinical Dietitian Pager: [phone] After Hours Pager: [phone]

## 2013-02-14 NOTE — Progress Notes (Signed)
Physical Therapy Treatment Patient Details Name: [name] [name] MRN: [id number] DOB: 07/18/1930 Today's Date: 02/14/2013 Time: 4782-9562 PT Time Calculation (min): 31 min  PT Assessment / Plan / Recommendation  History of Present Illness 79 yo female s/p R knee I&D, resection, antibiotic spacer placed. Recent hx of fall (11/1) resulting in multiple cervical fractures requiring Aspen collar at all times.    PT Comments   Pt very alert and participatory today. Pt ambulated  20' x 2.  Follow Up Recommendations  SNF     Does the patient have the potential to tolerate intense rehabilitation     Barriers to Discharge        Equipment Recommendations  Rolling walker with 5" wheels    Recommendations for Other Services    Frequency Min 5X/week   Progress towards PT Goals Progress towards PT goals:  (goals updated.)  Plan Current plan remains appropriate    Precautions / Restrictions Precautions Precautions: Fall;Cervical Precaution Comments: soft collar, KI AAT NO ROM Required Braces or Orthoses: Cervical Brace;Knee Immobilizer - Right Knee Immobilizer - Right: On at all times Cervical Brace: At all times;Soft collar Restrictions RLE Weight Bearing: Weight bearing as tolerated   Pertinent Vitals/Pain 7 R knee.    Mobility  Transfers Sit to Stand: 3: Mod assist;From chair/3-in-1;With upper extremity assist Stand to Sit: 3: Mod assist;To chair/3-in-1;With upper extremity assist Stand Pivot Transfers: 3: Mod assist Details for Transfer Assistance: mod cues for LE and UE placement, support RLE during descent. Ambulation/Gait Ambulation/Gait Assistance: 1: +2 Total assist Ambulation/Gait: Patient Percentage: 70% Ambulation Distance (Feet): 20 Feet (x2) Assistive device: Rolling walker Ambulation/Gait Assistance Details: Pt takes extra time, pt did tolerate some weight on RLE Gait Pattern: Step-to pattern;Decreased stride length;Trunk flexed;Antalgic;Decreased weight shift to  right;Decreased step length - right Gait velocity: decreased General Gait Details: VCs to lift head, steady, no LOB, increased time    Exercises     PT Diagnosis:    PT Problem List:   PT Treatment Interventions:     PT Goals (current goals can now be found in the care plan section) Acute Rehab PT Goals Patient Stated Goal: regain independence and decreased pain Time For Goal Achievement: 02/28/13 Potential to Achieve Goals: Good  Visit Information  Last PT Received On: 02/14/13 Assistance Needed: +2 History of Present Illness: 79 yo female s/p R knee I&D, resection, antibiotic spacer placed. Recent hx of fall (11/1) resulting in multiple cervical fractures requiring Aspen collar at all times.     Subjective Data  Patient Stated Goal: regain independence and decreased pain   Cognition  Cognition Arousal/Alertness: Awake/alert Behavior During Therapy: WFL for tasks assessed/performed Overall Cognitive Status: Within Functional Limits for tasks assessed    Balance     End of Session PT - End of Session Equipment Utilized During Treatment: Cervical collar;Right knee immobilizer Activity Tolerance: Patient tolerated treatment well Patient left: in chair;with call bell/phone within reach Nurse Communication: Mobility status   GP     Rada Hay 02/14/2013, 3:27 PM

## 2013-02-14 NOTE — Progress Notes (Signed)
Subjective: 8 Days Post-Op Procedure(s) (LRB): IRRIGATION AND DEBRIDEMENT RIGHT KNEE (Right) Patient reports pain as moderate.   Patient seen in rounds with Dr. Lequita Halt.  Doing "fair" today.  Trying to get rest. She will likely need further surgery.  Will recheck on rounds tomorrow and decide at that point of surgical plans.  Possibly tomorrow evening but will reassess the wound in the morning.  Will obtain new cultures if goes back to surgery. Patient is having problems with pain in the knee, requiring pain medications Plan is to go Skilled nursing facility after hospital stay.  Objective: Vital signs in last 24 hours: Temp:  [98.6 F (37 C)-98.9 F (37.2 C)] 98.6 F (37 C) (11/20 0450) Pulse Rate:  [73-86] 73 (11/20 0450) Resp:  [16] 16 (11/20 0450) BP: (126-148)/(64-70) 126/64 mmHg (11/20 0450) SpO2:  [94 %-98 %] 94 % (11/20 0450)  Intake/Output from previous day:  Intake/Output Summary (Last 24 hours) at 02/14/13 0910 Last data filed at 02/14/13 0448  Gross per 24 hour  Intake   1006 ml  Output   2100 ml  Net  -1094 ml    Labs:  Recent Labs  02/12/13 0555 02/13/13 0350 02/14/13 0352  HGB 9.7* 9.8* 9.0*    Recent Labs  02/13/13 0350 02/14/13 0352  WBC 15.8* 12.6*  RBC 3.31* 3.10*  HCT 30.0* 28.2*  PLT 505* 443*    Recent Labs  02/13/13 0350 02/14/13 0352  NA 125* 130*  K 4.0 4.1  CL 90* 93*  CO2 27 28  BUN 9 10  CREATININE 0.60 0.58  GLUCOSE 94 91  CALCIUM 8.5 8.4   No results found for this basename: LABPT, INR,  in the last 72 hours  EXAM General - Patient is Alert, Appropriate and Oriented Extremity - Neurovascular intact Sensation intact distally Dressing/Incision - dressing changed and middle area of incision opened up in the middle portion of incision. No active bleeding.  Extensor tendon exposed in mid are of wound. Motor Function - intact, moving foot and toes well on exam.   Past Medical History  Diagnosis Date  . Mixed  hyperlipidemia   . Hypothyroidism   . GERD (gastroesophageal reflux disease)   . Atrial fibrillation     Remote amiodarone use, recurred in 07/2011 post op from knee arthroplasty  . Obese   . History of blood transfusion 08/31/2011    A+ type  . History of GI bleed     Gastritis, was on Xarelto and ASA  . Carpal tunnel syndrome   . Arthritis     Knees  . DJD (degenerative joint disease), thoracolumbar   . Rheumatoid arthritis   . H/O bronchitis   . Hemorrhoids   . Measles     as child  . Mumps     as child  . Carotid artery disease   . PONV (postoperative nausea and vomiting)   . Anemia   . Difficulty sleeping   . 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   Weight as of this encounter: 68 kg (149 lb 14.6 oz). Up with therapy  DVT Prophylaxis - SCD's  Weight-Bearing as tolerated to right leg but No Bending or No Motion  WBC down to 12.6 Na+ back up to 130 HGB slightly lower at 9.0. Patient is asymptomatic at this time.  Continue with ceftriaxone 2gm IV daily for 6 weeks are per Dr. Drue Second.  PICC line for probably 6 weeks of IV antibiotic therapy.  Culture  ABUNDANT GROUP B STREP(S.AGALACTIAE)ISOLATED   Changed to wet to dry dressing changes yesterday. This morning's dressing has been done by Dr. Lequita Halt. Continue Twice A Day wet to dry dressings at this time.  Dr.  Lequita Halt switched the patient out from the hard collar to a soft collar. Wear at all times except may lossen up a little while in bed supported.  Spoke with Dr. Drue Second yesterday. Will get repeat cultures if any new or significant drainage or if she goes back to surgery for any further debridement.  Possible need for surgery tomorrow but will reassess the wound in the morning.  Tejuan Gholson 02/14/2013, 9:10 AM

## 2013-02-15 ENCOUNTER — Inpatient Hospital Stay (HOSPITAL_COMMUNITY): Payer: Medicare Other | Admitting: Anesthesiology

## 2013-02-15 ENCOUNTER — Encounter (HOSPITAL_COMMUNITY): Payer: Medicare Other | Admitting: Anesthesiology

## 2013-02-15 ENCOUNTER — Encounter (HOSPITAL_COMMUNITY)
Admission: RE | Disposition: A | Discharge status: Skilled Nursing Facility | Payer: Self-pay | Source: Ambulatory Visit | Attending: Orthopedic Surgery

## 2013-02-15 ENCOUNTER — Encounter (HOSPITAL_COMMUNITY): Payer: Self-pay | Admitting: Certified Registered Nurse Anesthetist

## 2013-02-15 HISTORY — PX: EXCISIONAL TOTAL KNEE ARTHROPLASTY WITH ANTIBIOTIC SPACERS: SHX5827

## 2013-02-15 LAB — CBC
HCT: 29.2 % — ABNORMAL LOW (ref 36.0–46.0)
Hemoglobin: 9.6 g/dL — ABNORMAL LOW (ref 12.0–15.0)
MCH: 29.8 pg (ref 26.0–34.0)
MCHC: 32.9 g/dL (ref 30.0–36.0)
MCV: 90.7 fL (ref 78.0–100.0)
RBC: 3.22 MIL/uL — ABNORMAL LOW (ref 3.87–5.11)
RDW: 16.1 % — ABNORMAL HIGH (ref 11.5–15.5)

## 2013-02-15 LAB — BASIC METABOLIC PANEL
BUN: 9 mg/dL (ref 6–23)
Chloride: 93 mEq/L — ABNORMAL LOW (ref 96–112)
Creatinine, Ser: 0.6 mg/dL (ref 0.50–1.10)
GFR calc Af Amer: >90 mL/min (ref >90)
GFR calc non Af Amer: 84 mL/min — ABNORMAL LOW (ref >90)
Glucose, Bld: 91 mg/dL (ref 70–99)
Potassium: 4.2 mEq/L (ref 3.5–5.1)

## 2013-02-15 SURGERY — REMOVAL, TOTAL ARTHROPLASTY HARDWARE, KNEE, WITH ANTIBIOTIC SPACER INSERTION
Anesthesia: General | Site: Knee | Laterality: Right | Wound class: Dirty or Infected

## 2013-02-15 MED ORDER — ONDANSETRON HCL 4 MG/2ML IJ SOLN: 4.0000 mg | Freq: Four times a day (QID) | INTRAMUSCULAR | Status: DC | PRN

## 2013-02-15 MED ORDER — ONDANSETRON HCL 4 MG PO TABS: 4.0000 mg | ORAL_TABLET | Freq: Four times a day (QID) | ORAL | Status: DC | PRN

## 2013-02-15 MED ORDER — FENTANYL CITRATE 0.05 MG/ML IJ SOLN
INTRAMUSCULAR | Status: AC
  Filled 2013-02-15: qty 2

## 2013-02-15 MED ORDER — FENTANYL CITRATE 0.05 MG/ML IJ SOLN
INTRAMUSCULAR | Status: AC
  Administered 2013-02-15: 100 ug via EPIDURAL
  Filled 2013-02-15: qty 2

## 2013-02-15 MED ORDER — PHENYLEPHRINE HCL 10 MG/ML IJ SOLN
INTRAMUSCULAR | Status: DC | PRN
  Administered 2013-02-15 (×2): 80 ug via INTRAVENOUS

## 2013-02-15 MED ORDER — SUCCINYLCHOLINE CHLORIDE 20 MG/ML IJ SOLN
INTRAMUSCULAR | Status: AC
  Filled 2013-02-15: qty 1

## 2013-02-15 MED ORDER — FENTANYL CITRATE 0.05 MG/ML IJ SOLN
25.0000 ug | INTRAMUSCULAR | Status: DC | PRN
  Administered 2013-02-15 (×3): 50 ug via INTRAVENOUS

## 2013-02-15 MED ORDER — PHENYLEPHRINE 40 MCG/ML (10ML) SYRINGE FOR IV PUSH (FOR BLOOD PRESSURE SUPPORT)
PREFILLED_SYRINGE | INTRAVENOUS | Status: AC
  Filled 2013-02-15: qty 10

## 2013-02-15 MED ORDER — LIDOCAINE HCL (CARDIAC) 20 MG/ML IV SOLN
INTRAVENOUS | Status: DC | PRN
  Administered 2013-02-15: 100 mg via INTRAVENOUS

## 2013-02-15 MED ORDER — PROPOFOL 10 MG/ML IV BOLUS
INTRAVENOUS | Status: DC | PRN
  Administered 2013-02-15: 120 mg via INTRAVENOUS

## 2013-02-15 MED ORDER — DEXAMETHASONE SODIUM PHOSPHATE 10 MG/ML IJ SOLN
INTRAMUSCULAR | Status: AC
  Filled 2013-02-15: qty 1

## 2013-02-15 MED ORDER — FENTANYL CITRATE 0.05 MG/ML IJ SOLN
INTRAMUSCULAR | Status: DC | PRN
  Administered 2013-02-15 (×6): 50 ug via INTRAVENOUS
  Administered 2013-02-15: 25 ug via INTRAVENOUS
  Administered 2013-02-15: 50 ug via INTRAVENOUS
  Administered 2013-02-15: 25 ug via INTRAVENOUS

## 2013-02-15 MED ORDER — ONDANSETRON HCL 4 MG/2ML IJ SOLN
INTRAMUSCULAR | Status: DC | PRN
  Administered 2013-02-15: 4 mg via INTRAVENOUS

## 2013-02-15 MED ORDER — ETOMIDATE 2 MG/ML IV SOLN
INTRAVENOUS | Status: AC
  Filled 2013-02-15: qty 10

## 2013-02-15 MED ORDER — 0.9 % SODIUM CHLORIDE (POUR BTL) OPTIME
TOPICAL | Status: DC | PRN
  Administered 2013-02-15: 1000 mL

## 2013-02-15 MED ORDER — PROMETHAZINE HCL 25 MG/ML IJ SOLN: 6.2500 mg | INTRAMUSCULAR | Status: DC | PRN

## 2013-02-15 MED ORDER — SODIUM CHLORIDE 0.9 % IR SOLN
Status: DC | PRN
  Administered 2013-02-15: 3000 mL

## 2013-02-15 MED ORDER — LIDOCAINE HCL (CARDIAC) 20 MG/ML IV SOLN
INTRAVENOUS | Status: AC
  Filled 2013-02-15: qty 5

## 2013-02-15 MED ORDER — METOCLOPRAMIDE HCL 5 MG/ML IJ SOLN: 5.0000 mg | Freq: Three times a day (TID) | INTRAMUSCULAR | Status: DC | PRN

## 2013-02-15 MED ORDER — METOCLOPRAMIDE HCL 10 MG PO TABS: 5.0000 mg | ORAL_TABLET | Freq: Three times a day (TID) | ORAL | Status: DC | PRN

## 2013-02-15 MED ORDER — SODIUM CHLORIDE 0.9 % IR SOLN
Status: DC | PRN
  Administered 2013-02-15: 1000 mL

## 2013-02-15 MED ORDER — ONDANSETRON HCL 4 MG/2ML IJ SOLN
INTRAMUSCULAR | Status: AC
  Filled 2013-02-15: qty 2

## 2013-02-15 MED ORDER — DEXAMETHASONE SODIUM PHOSPHATE 10 MG/ML IJ SOLN
INTRAMUSCULAR | Status: DC | PRN
  Administered 2013-02-15: 10 mg via INTRAVENOUS

## 2013-02-15 MED ORDER — PROPOFOL 10 MG/ML IV BOLUS
INTRAVENOUS | Status: AC
  Filled 2013-02-15: qty 20

## 2013-02-15 MED ORDER — LACTATED RINGERS IV SOLN
INTRAVENOUS | Status: DC | PRN
  Administered 2013-02-15: 17:00:00 via INTRAVENOUS

## 2013-02-15 SURGICAL SUPPLY — 61 items
BAG SPEC THK2 15X12 ZIP CLS (MISCELLANEOUS) ×1
BAG ZIPLOCK 12X15 (MISCELLANEOUS) ×2 IMPLANT
BANDAGE ELASTIC 6 VELCRO ST LF (GAUZE/BANDAGES/DRESSINGS) ×2 IMPLANT
BANDAGE ESMARK 6X9 LF (GAUZE/BANDAGES/DRESSINGS) ×1 IMPLANT
BLADE SAG 18X100X1.27 (BLADE) ×1 IMPLANT
BLADE SAW SGTL 11.0X1.19X90.0M (BLADE) ×2 IMPLANT
BNDG CMPR 9X6 STRL LF SNTH (GAUZE/BANDAGES/DRESSINGS) ×1
BNDG ESMARK 6X9 LF (GAUZE/BANDAGES/DRESSINGS) ×2
BONE CEMENT GENTAMICIN (Cement) ×4 IMPLANT
CEMENT BONE GENTAMICIN 40 (Cement) ×3 IMPLANT
CONT SPECI 4OZ STER CLIK (MISCELLANEOUS) ×1 IMPLANT
CUFF TOURN SGL QUICK 34 (TOURNIQUET CUFF) ×2
CUFF TRNQT CYL 34X4X40X1 (TOURNIQUET CUFF) ×2 IMPLANT
DRAPE EXTREMITY T 121X128X90 (DRAPE) ×2 IMPLANT
DRAPE POUCH INSTRU U-SHP 10X18 (DRAPES) ×2 IMPLANT
DRAPE U-SHAPE 47X51 STRL (DRAPES) ×2 IMPLANT
DRSG ADAPTIC 3X8 NADH LF (GAUZE/BANDAGES/DRESSINGS) ×2 IMPLANT
DRSG PAD ABDOMINAL 8X10 ST (GAUZE/BANDAGES/DRESSINGS) ×1 IMPLANT
DURAPREP 26ML APPLICATOR (WOUND CARE) ×2 IMPLANT
ELECT REM PT RETURN 9FT ADLT (ELECTROSURGICAL) ×2
ELECTRODE REM PT RTRN 9FT ADLT (ELECTROSURGICAL) ×1 IMPLANT
EVACUATOR 1/8 PVC DRAIN (DRAIN) ×3 IMPLANT
FACESHIELD LNG OPTICON STERILE (SAFETY) ×8 IMPLANT
GLOVE BIO SURGEON STRL SZ7.5 (GLOVE) ×3 IMPLANT
GLOVE BIO SURGEON STRL SZ8 (GLOVE) ×2 IMPLANT
GLOVE BIOGEL PI IND STRL 7.5 (GLOVE) IMPLANT
GLOVE BIOGEL PI IND STRL 8 (GLOVE) ×1 IMPLANT
GLOVE BIOGEL PI INDICATOR 7.5 (GLOVE) ×1
GLOVE BIOGEL PI INDICATOR 8 (GLOVE) ×1
GLOVE SURG SS PI 7.5 STRL IVOR (GLOVE) ×1 IMPLANT
GOWN PREVENTION PLUS LG XLONG (DISPOSABLE) ×3 IMPLANT
GOWN STRL NON-REIN LRG LVL3 (GOWN DISPOSABLE) ×1 IMPLANT
GOWN STRL REIN XL XLG (GOWN DISPOSABLE) ×2 IMPLANT
HANDPIECE INTERPULSE COAX TIP (DISPOSABLE) ×2
IMMOBILIZER KNEE 20 (SOFTGOODS) ×2
IMMOBILIZER KNEE 20 THIGH 36 (SOFTGOODS) IMPLANT
KIT BASIN OR (CUSTOM PROCEDURE TRAY) ×2 IMPLANT
MANIFOLD NEPTUNE II (INSTRUMENTS) ×2 IMPLANT
NS IRRIG 1000ML POUR BTL (IV SOLUTION) ×3 IMPLANT
PACK TOTAL JOINT (CUSTOM PROCEDURE TRAY) ×2 IMPLANT
PAD ABD 7.5X8 STRL (GAUZE/BANDAGES/DRESSINGS) ×2 IMPLANT
PADDING CAST COTTON 6X4 STRL (CAST SUPPLIES) ×2 IMPLANT
POSITIONER SURGICAL ARM (MISCELLANEOUS) ×2 IMPLANT
SET HNDPC FAN SPRY TIP SCT (DISPOSABLE) ×1 IMPLANT
SPONGE GAUZE 4X4 12PLY (GAUZE/BANDAGES/DRESSINGS) ×2 IMPLANT
SPONGE LAP 18X18 X RAY DECT (DISPOSABLE) ×2 IMPLANT
STAPLER VISISTAT 35W (STAPLE) ×2 IMPLANT
SUCTION FRAZIER TIP 10 FR DISP (SUCTIONS) ×1 IMPLANT
SUT ETH 1 BLK MONO CTX 30 (SUTURE) ×2 IMPLANT
SUT PDS AB 1 CT1 27 (SUTURE) ×2 IMPLANT
SUT VIC AB 1 CT1 27 (SUTURE) ×2
SUT VIC AB 1 CT1 27XBRD ANTBC (SUTURE) ×3 IMPLANT
SUT VIC AB 2-0 CT1 27 (SUTURE) ×10
SUT VIC AB 2-0 CT1 TAPERPNT 27 (SUTURE) ×3 IMPLANT
SWAB COLLECTION DEVICE MRSA (MISCELLANEOUS) ×2 IMPLANT
TOWEL OR 17X26 10 PK STRL BLUE (TOWEL DISPOSABLE) ×4 IMPLANT
TOWER CARTRIDGE SMART MIX (DISPOSABLE) ×2 IMPLANT
TRAY FOLEY CATH 14FRSI W/METER (CATHETERS) ×1 IMPLANT
TUBE ANAEROBIC SPECIMEN COL (MISCELLANEOUS) ×2 IMPLANT
WATER STERILE IRR 1500ML POUR (IV SOLUTION) ×1 IMPLANT
WRAP KNEE MAXI GEL POST OP (GAUZE/BANDAGES/DRESSINGS) ×5 IMPLANT

## 2013-02-15 NOTE — Preoperative (Signed)
Beta Blockers   Reason not to administer Beta Blockers:Not Applicable 

## 2013-02-15 NOTE — Progress Notes (Signed)
    [facility]    Date of Admission:  01/28/2013   Total days of antibiotics 18        Day 17 ceftriaxone           ID: [name] [name] is a 79 y.o. female  with TKA in July 2014 c/b post op hematoma s/p I x D who has had poor wound healing and dehiscence. admitted for management of infected prosthetic joint. POD#1 prosthesis resection and antibiotic spacer placement. 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 ALPRAZolam  0.5 mg Oral Daily   And  . ALPRAZolam  1 mg Oral QHS  . cefTRIAXone (ROCEPHIN)  IV  2 g Intravenous Q24H  . docusate sodium  100 mg Oral BID  . esomeprazole  40 mg Oral Daily  . feeding supplement (ENSURE COMPLETE)  237 mL Oral TID WC  . feeding supplement (RESOURCE BREEZE)  1 Container Oral Q24H  . ferrous sulfate  325 mg Oral BID  . fesoterodine  8 mg Oral Daily  . gabapentin  300 mg Oral BID  . levothyroxine  150 mcg Oral QAC breakfast  . multivitamin with minerals  1 tablet Oral Daily  . simvastatin  20 mg Oral q morning - 10a    Objective: Vital signs in last 24 hours: Temp:  [98.4 F (36.9 C)-99.4 F (37.4 C)] 98.9 F (37.2 C) (11/21 0640) Pulse Rate:  [74-89] 74 (11/21 0640) Resp:  [16] 16 (11/21 0640) BP: (120-162)/(62-75) 146/71 mmHg (11/21 0640) SpO2:  [95 %-98 %] 95 % (11/21 0640)  No physical exam  Lab Results  Recent Labs  02/14/13 0352 02/15/13 0650  WBC 12.6* 12.6*  HGB 9.0* 9.6*  HCT 28.2* 29.2*  NA 130* 128*  K 4.1 4.2  CL 93* 93*  CO2 28 27  BUN 10 9  CREATININE 0.58 0.60     Microbiology: 11/3 wound cx : group b strep Studies/Results: No results found.   Assessment/Plan: 79yo F with group b strep prosthetic joint infection, now with antibiotic spacer, currently on ceftriaxone, complicated by poor wound healing, wound dehiscence, currently on day #15/ 42 of antibiotic course  - going back to OR for I x D, please send repeat specimen for cutlure to ensure we are not missing any other pathogens  Drue Second [facility] Cell: [phone] Pager: [phone]  02/15/2013, 1:54 PM

## 2013-02-15 NOTE — Interval H&P Note (Signed)
History and Physical Interval Note:  02/15/2013 5:51 PM  [name] [name]  has presented today for surgery, with the diagnosis of INFECTED RIGHT KNEE  The various methods of treatment have been discussed with the patient and family. After consideration of risks, benefits and other options for treatment, the patient has consented to  Procedure(s): IRRIGATION AND DEBRIDIMENT RIGHT KNEE ARTHROPLASTY WITH EXCHANGE OF  ANTIBIOTIC SPACERS (Right) as a surgical intervention .  The patient's history has been reviewed, patient examined, no change in status, stable for surgery.  I have reviewed the patient's chart and labs.  Questions were answered to the patient's satisfaction.     Loanne Drilling

## 2013-02-15 NOTE — Progress Notes (Signed)
CSW continues to follow this pt for d/c planning and emotional support. Pt hopes to have rehab at [facility] at d/c. CSW continuse to update SNF on pt's progress.  Cori Razor LCSW [phone]

## 2013-02-15 NOTE — H&P (View-Only) (Signed)
Subjective: 9 Days Post-Op Procedure(s) (LRB): IRRIGATION AND DEBRIDEMENT RIGHT KNEE (Right) Patient reports pain as mild and moderate.   Patient seen in rounds with Dr. Lequita Halt.  Dressing changed and discussed further surgery today.  Will make NPO after liquid breakfast and then plan on I&D later this afternoon. Patient is having problems with pain in the knee, requiring pain medications Plan is to go Skilled nursing facility after hospital stay.  Objective: Vital signs in last 24 hours: Temp:  [98.4 F (36.9 C)-99.4 F (37.4 C)] 98.9 F (37.2 C) (11/21 0640) Pulse Rate:  [74-89] 74 (11/21 0640) Resp:  [16] 16 (11/21 0640) BP: (120-162)/(62-75) 146/71 mmHg (11/21 0640) SpO2:  [95 %-98 %] 95 % (11/21 0640)  Intake/Output from previous day:  Intake/Output Summary (Last 24 hours) at 02/15/13 0930 Last data filed at 02/15/13 0630  Gross per 24 hour  Intake    820 ml  Output   1950 ml  Net  -1130 ml    Labs:  Recent Labs  02/13/13 0350 02/14/13 0352 02/15/13 0650  HGB 9.8* 9.0* 9.6*    Recent Labs  02/14/13 0352 02/15/13 0650  WBC 12.6* 12.6*  RBC 3.10* 3.22*  HCT 28.2* 29.2*  PLT 443* 474*    Recent Labs  02/14/13 0352 02/15/13 0650  NA 130* 128*  K 4.1 4.2  CL 93* 93*  CO2 28 27  BUN 10 9  CREATININE 0.58 0.60  GLUCOSE 91 91  CALCIUM 8.4 8.3*   No results found for this basename: LABPT, INR,  in the last 72 hours  EXAM General - Patient is Alert and Appropriate Extremity - Neurovascular intact Sensation intact distally Dorsiflexion/Plantar flexion intact Dressing/Incision - dressing changed and area of incision opened up in the middle portion of incision.  Appears to be some early granulation tissue at the base of the wound. No active bleeding. Extensor tendon exposed in mid are of wound. Motor Function - intact, moving foot and toes well on exam.   Past Medical History  Diagnosis Date  . Mixed hyperlipidemia   . Hypothyroidism   . GERD  (gastroesophageal reflux disease)   . Atrial fibrillation     Remote amiodarone use, recurred in 07/2011 post op from knee arthroplasty  . Obese   . History of blood transfusion 08/31/2011    A+ type  . History of GI bleed     Gastritis, was on Xarelto and ASA  . Carpal tunnel syndrome   . Arthritis     Knees  . DJD (degenerative joint disease), thoracolumbar   . Rheumatoid arthritis   . H/O bronchitis   . Hemorrhoids   . Measles     as child  . Mumps     as child  . Carotid artery disease   . PONV (postoperative nausea and vomiting)   . Anemia   . Difficulty sleeping   . Multiple bruises     DUE TO FALL 01/24/13    Assessment/Plan: 9 Days Post-Op Procedure(s) (LRB): IRRIGATION AND DEBRIDEMENT RIGHT KNEE (Right) Active Problems:   Essential hypertension, benign   Hyponatremia   Postoperative anemia due to acute blood loss   Postop Transfusion   Surgical wound infection   Leukocytosis   Hypothyroidism   Necrosis of surgical wound   Acquired absence of knee joint following removal of joint prosthesis with presence of antibiotic-impregnated cement spacer   Group B streptococcal infection  Estimated body mass index is 24.68 kg/(m^2) as calculated from the following:  Height as of this encounter: 5' 5.35" (1.66 m).   Weight as of this encounter: 68 kg (149 lb 14.6 oz).  DVT Prophylaxis - SCD's  Weight-Bearing as tolerated to right leg but No Bending or No Motion  WBC stable at 12.6  Na+ back down to 128  HGB slightly higher at 9.6. Patient is asymptomatic at this time.  Continue with ceftriaxone 2gm IV daily for 6 weeks are per Dr. Drue Second.  PICC line for probably 6 weeks of IV antibiotic therapy.  Culture  ABUNDANT GROUP B STREP(S.AGALACTIAE)ISOLATED   Changed to wet to dry dressing changes. This morning's dressing has been done by Dr. Lequita Halt. Continue Twice A Day wet to dry dressings at this time.  Dr. Lequita Halt switched the patient out from the hard collar to a soft  collar. Wear at all times except may lossen up a little while in bed supported.  Will get repeat cultures at time of surgery later today.  [name] [name] 02/15/2013, 9:30 AM

## 2013-02-15 NOTE — Progress Notes (Signed)
 Subjective: 9 Days Post-Op Procedure(s) (LRB): IRRIGATION AND DEBRIDEMENT RIGHT KNEE (Right) Patient reports pain as mild and moderate.   Patient seen in rounds with Dr. Aluisio.  Dressing changed and discussed further surgery today.  Will make NPO after liquid breakfast and then plan on I&D later this afternoon. Patient is having problems with pain in the knee, requiring pain medications Plan is to go Skilled nursing facility after hospital stay.  Objective: Vital signs in last 24 hours: Temp:  [98.4 F (36.9 C)-99.4 F (37.4 C)] 98.9 F (37.2 C) (11/21 0640) Pulse Rate:  [74-89] 74 (11/21 0640) Resp:  [16] 16 (11/21 0640) BP: (120-162)/(62-75) 146/71 mmHg (11/21 0640) SpO2:  [95 %-98 %] 95 % (11/21 0640)  Intake/Output from previous day:  Intake/Output Summary (Last 24 hours) at 02/15/13 0930 Last data filed at 02/15/13 0630  Gross per 24 hour  Intake    820 ml  Output   1950 ml  Net  -1130 ml    Labs:  Recent Labs  02/13/13 0350 02/14/13 0352 02/15/13 0650  HGB 9.8* 9.0* 9.6*    Recent Labs  02/14/13 0352 02/15/13 0650  WBC 12.6* 12.6*  RBC 3.10* 3.22*  HCT 28.2* 29.2*  PLT 443* 474*    Recent Labs  02/14/13 0352 02/15/13 0650  NA 130* 128*  K 4.1 4.2  CL 93* 93*  CO2 28 27  BUN 10 9  CREATININE 0.58 0.60  GLUCOSE 91 91  CALCIUM 8.4 8.3*   No results found for this basename: LABPT, INR,  in the last 72 hours  EXAM General - Patient is Alert and Appropriate Extremity - Neurovascular intact Sensation intact distally Dorsiflexion/Plantar flexion intact Dressing/Incision - dressing changed and area of incision opened up in the middle portion of incision.  Appears to be some early granulation tissue at the base of the wound. No active bleeding. Extensor tendon exposed in mid are of wound. Motor Function - intact, moving foot and toes well on exam.   Past Medical History  Diagnosis Date  . Mixed hyperlipidemia   . Hypothyroidism   . GERD  (gastroesophageal reflux disease)   . Atrial fibrillation     Remote amiodarone use, recurred in 07/2011 post op from knee arthroplasty  . Obese   . History of blood transfusion 08/31/2011    A+ type  . History of GI bleed     Gastritis, was on Xarelto and ASA  . Carpal tunnel syndrome   . Arthritis     Knees  . DJD (degenerative joint disease), thoracolumbar   . Rheumatoid arthritis   . H/O bronchitis   . Hemorrhoids   . Measles     as child  . Mumps     as child  . Carotid artery disease   . PONV (postoperative nausea and vomiting)   . Anemia   . Difficulty sleeping   . 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   Weight as of this encounter: 68 kg (149 lb 14.6 oz).  DVT Prophylaxis - SCD's  Weight-Bearing as tolerated to right leg but No Bending or No Motion  WBC stable at 12.6  Na+ back down to 128  HGB slightly higher at 9.6. Patient is asymptomatic at this time.  Continue with ceftriaxone 2gm IV daily for 6 weeks are per Dr. Snider.  PICC line for probably 6 weeks of IV antibiotic therapy.  Culture  ABUNDANT GROUP B STREP(S.AGALACTIAE)ISOLATED   Changed to wet to dry dressing changes. This morning's dressing has been done by Dr. Aluisio. Continue Twice A Day wet to dry dressings at this time.  Dr. Aluisio switched the patient out from the hard collar to a soft  collar. Wear at all times except may lossen up a little while in bed supported.  Will get repeat cultures at time of surgery later today.  Abner Ardis 02/15/2013, 9:30 AM      

## 2013-02-15 NOTE — Brief Op Note (Signed)
01/28/2013 - 02/15/2013  7:45 PM  PATIENT:  [name] [name]  79 y.o. female  PRE-OPERATIVE DIAGNOSIS:  INFECTED RIGHT KNEE  POST-OPERATIVE DIAGNOSIS:  infected right knee  PROCEDURE:  Procedure(s): IRRIGATION AND DEBRIDIMENT RIGHT KNEE  WITH EXCHANGE OF  ANTIBIOTIC SPACERS (Right)  SURGEON:  Surgeon(s) and Role:    * Loanne Drilling, MD - Primary  PHYSICIAN ASSISTANT:   ASSISTANTS: Avel Peace, PA-C   ANESTHESIA:   general  EBL:  Total I/O In: -  Out: 150 [Blood:150]  BLOOD ADMINISTERED:none  DRAINS: (Medium) Hemovact drain(s) in the right knee with  Suction Open   LOCAL MEDICATIONS USED:  NONE  COUNTS:  YES  TOURNIQUET:   Total Tourniquet Time Documented: Thigh (Right) - 20 minutes Total: Thigh (Right) - 20 minutes   DICTATION: .Other Dictation: Dictation Number [id number]  PLAN OF CARE: Admit to inpatient   PATIENT DISPOSITION:  PACU - hemodynamically stable.

## 2013-02-15 NOTE — Anesthesia Preprocedure Evaluation (Signed)
Anesthesia Evaluation  Patient identified by MRN, date of birth, ID band Patient awake    Reviewed: Allergy & Precautions, H&P , NPO status , Patient's Chart, lab work & pertinent test results  History of Anesthesia Complications (+) PONV  Airway Mallampati: II TM Distance: >3 FB Neck ROM: Full    Dental no notable dental hx.    Pulmonary neg pulmonary ROS,  breath sounds clear to auscultation  Pulmonary exam normal       Cardiovascular Rhythm:Regular Rate:Normal     Neuro/Psych negative neurological ROS  negative psych ROS   GI/Hepatic Neg liver ROS, GERD-  Medicated,  Endo/Other  Hypothyroidism   Renal/GU negative Renal ROS  negative genitourinary   Musculoskeletal  (+) Arthritis -, Rheumatoid disorders,    Abdominal   Peds negative pediatric ROS (+)  Hematology  (+) anemia ,   Anesthesia Other Findings   Reproductive/Obstetrics negative OB ROS                           Anesthesia Physical Anesthesia Plan  ASA: III  Anesthesia Plan: General   Post-op Pain Management:    Induction: Intravenous  Airway Management Planned: LMA  Additional Equipment:   Intra-op Plan:   Post-operative Plan:   Informed Consent: I have reviewed the patients History and Physical, chart, labs and discussed the procedure including the risks, benefits and alternatives for the proposed anesthesia with the patient or authorized representative who has indicated his/her understanding and acceptance.   Dental advisory given  Plan Discussed with: CRNA and Surgeon  Anesthesia Plan Comments:         Anesthesia Quick Evaluation

## 2013-02-15 NOTE — Transfer of Care (Signed)
Immediate Anesthesia Transfer of Care Note  Patient: [name] [name]  Procedure(s) Performed: Procedure(s) (LRB): IRRIGATION AND DEBRIDIMENT RIGHT KNEE  WITH EXCHANGE OF  ANTIBIOTIC SPACERS (Right)  Patient Location: PACU  Anesthesia Type: General  Level of Consciousness: sedated, patient cooperative and responds to stimulation  Airway & Oxygen Therapy: Patient Spontanous Breathing and Patient connected to face mask oxgen  Post-op Assessment: Report given to PACU RN and Post -op Vital signs reviewed and stable  Post vital signs: Reviewed and stable  Complications: No apparent anesthesia complications

## 2013-02-15 NOTE — Progress Notes (Signed)
02/15/2013 Dory Peru RN CCM [phone] Pt with poor wound healing and dehiscence. Having moderate pain , requiring po and IV dilaudid. Plan remains SNF rehab at this time. Discussed in long length of stay meeting 02/14/2013.  CM will follow.

## 2013-02-16 LAB — BASIC METABOLIC PANEL
BUN: 10 mg/dL (ref 6–23)
CO2: 25 mEq/L (ref 19–32)
Calcium: 8.3 mg/dL — ABNORMAL LOW (ref 8.4–10.5)
Chloride: 92 mEq/L — ABNORMAL LOW (ref 96–112)
Creatinine, Ser: 0.59 mg/dL (ref 0.50–1.10)
GFR calc Af Amer: >90 mL/min (ref >90)
GFR calc non Af Amer: 84 mL/min — ABNORMAL LOW (ref >90)
Glucose, Bld: 147 mg/dL — ABNORMAL HIGH (ref 70–99)
Potassium: 4.8 mEq/L (ref 3.5–5.1)

## 2013-02-16 LAB — CBC
MCV: 91.2 fL (ref 78.0–100.0)
Platelets: 467 10*3/uL — ABNORMAL HIGH (ref 150–400)
RBC: 2.95 MIL/uL — ABNORMAL LOW (ref 3.87–5.11)
WBC: 15.6 10*3/uL — ABNORMAL HIGH (ref 4.0–10.5)

## 2013-02-16 NOTE — Progress Notes (Signed)
Physical Therapy Treatment Patient Details Name: [name] [name] MRN: [id number] DOB: [birth year] Today's Date: 02/16/2013 Time: 1211-1238 PT Time Calculation (min): 27 min  PT Assessment / Plan / Recommendation  History of Present Illness 79 yo female s/p R knee I&D, resection, antibiotic spacer placed. Recent hx of fall (11/1) resulting in multiple cervical fractures requires soft collar at all times.    PT Comments   Pt underwent another surgery yesterday and is currently TDWB.  Pt performed stand pivot transfers and educated on wheelchair management/propulsion.     Follow Up Recommendations  SNF     Does the patient have the potential to tolerate intense rehabilitation     Barriers to Discharge        Equipment Recommendations  Rolling walker with 5" wheels;Wheelchair (measurements PT)    Recommendations for Other Services    Frequency Min 3X/week   Progress towards PT Goals Progress towards PT goals: Progressing toward goals  Plan Frequency needs to be updated    Precautions / Restrictions Precautions Precautions: Fall;Cervical Precaution Comments: soft collar, KI AAT NO ROM Required Braces or Orthoses: Cervical Brace;Knee Immobilizer - Right Knee Immobilizer - Right: On at all times Cervical Brace: At all times;Soft collar Restrictions Weight Bearing Restrictions: Yes RLE Weight Bearing: Touchdown weight bearing   Pertinent Vitals/Pain Pt states she has increased pain but willing to mobilize, RN notified at start of session and brought pain meds.    Mobility  Bed Mobility Bed Mobility: Supine to Sit Supine to Sit: 1: +2 Total assist Supine to Sit: Patient Percentage: 50% Details for Bed Mobility Assistance: assist bil LEs and slight assist for trunk Transfers Transfers: Sit to Stand;Stand to Sit;Stand Pivot Transfers Sit to Stand: 1: +2 Total assist;From bed;From chair/3-in-1 Sit to Stand: Patient Percentage: 50% Stand to Sit: To chair/3-in-1;1: +2 Total  assist Stand to Sit: Patient Percentage: 50% Stand Pivot Transfers: 1: +2 Total assist Stand Pivot Transfers: Patient Percentage: 50% Details for Transfer Assistance: verbal cues for LE and UE placement including keeping R LE forward and TDWB only as well as reaching for armrests with stand pivots, bed to w/c to Office manager Wheelchair Mobility: Yes Wheelchair Assistance: 4: Min Chiropodist Details (indicate cue type and reason): pt educated on parts of wheelchair, UEs only strong enough to adjust brakes halfway, verbal cues for turning Wheelchair Propulsion: Both upper extremities;Left lower extremity Wheelchair Parts Management: Needs assistance Distance: 60    Exercises     PT Diagnosis:    PT Problem List:   PT Treatment Interventions:     PT Goals (current goals can now be found in the care plan section)    Visit Information  Last PT Received On: 02/16/13 Assistance Needed: +2 History of Present Illness: 79 yo female s/p R knee I&D, resection, antibiotic spacer placed. Recent hx of fall (11/1) resulting in multiple cervical fractures requires soft collar at all times.     Subjective Data      Cognition  Cognition Arousal/Alertness: Awake/alert Behavior During Therapy: WFL for tasks assessed/performed Overall Cognitive Status: Within Functional Limits for tasks assessed    Balance     End of Session PT - End of Session Equipment Utilized During Treatment: Cervical collar;Right knee immobilizer Activity Tolerance: Patient tolerated treatment well Patient left: in chair;with call bell/phone within reach   GP     Abdulkarim Eberlin,KATHrine E 02/16/2013, 1:36 PM Zenovia Jarred, PT, DPT 02/16/2013 Pager: [phone]

## 2013-02-16 NOTE — Op Note (Signed)
NAMETEMIMA, KUTSCH                 ACCOUNT NO.:  [id number]  MEDICAL RECORD NO.:  [id number]  LOCATION:  1609                         FACILITY:  [facility]  PHYSICIAN:  Ollen Gross, M.D.    DATE OF BIRTH:  1930-05-25  DATE OF PROCEDURE:  02/15/2013 DATE OF DISCHARGE:                              OPERATIVE REPORT   PREOPERATIVE DIAGNOSIS:  Infected right knee.  POSTOPERATIVE DIAGNOSIS:  Infected right knee.  PROCEDURE:  Irrigation and debridement of right knee with exchange of antibiotic spacer.  SURGEON:  Ollen Gross, M.D.  ASSISTANT:  Alexzandrew L. Perkins, P.A.C.  ANESTHESIA:  General.  ESTIMATED BLOOD LOSS:  300.  DRAINS:  Hemovac x2.  COMPLICATIONS:  None.  TOURNIQUET TIME:  20 minutes at 300 mmHg.  CONDITION:  Stable to recovery.  CLINICAL NOTE:  Ms. Dancy is an 79 year old female with very complex history in regards to her right knee.  She has had an infection, had a resection of her total knee arthroplasty several weeks ago.  She has had another debridement since.  She had issues with bleeding after all of her procedures.  After this last procedure, she did not have a hematoma, but started draining again.  She presents to the operating room today for irrigation and debridement, antibiotic spacer exchange.  PROCEDURE IN DETAIL:  After successful administration of general anesthetic, a tourniquet was placed high on her right thigh, right lower extremity, prepped and draped in the usual sterile fashion.  She had a quarter-sized open area over the patellar tendon.  Patellar tendon appeared necrotic.  We opened up her incision after the leg was elevated and tourniquet inflated to 300 mmHg.  Incision was opened, and I debrided that tendon, essentially removing the patellar tendon.  The arthrotomy was made.  Fluid was sent for Gram stain, C and S, aerobic and anaerobic cultures.  We thoroughly irrigated the joint with 3 liters of saline using pulsatile lavage.  The  spacer was removed, and we irrigated with another 3 liters of saline and pulsatile lavage.  A new spacer was formulated, which was going to be less thick than the other space in order to help close the space down and allow for the tissue to be closed primarily.  The spacer was formulated with 2 batches of gentamicin-impregnated cement.  Once it was ready to be molded, it was molded in place into the extension space.  It was a stable spacer.  We then closed any remaining deep tissue over Hemovac drain.  There was an open area over the spacer centrally where the tendon had been debrided and removed.  The subcu was able to be closed primarily over another limb of Hemovac drain with interrupted 2-0 Vicryl.  The skin was closed with combination of staples and nylon sutures.  With the shortening of the extension space, I was able to close the skin without any undue tension, which is encouraging.  Prior to closure, tourniquet had been released, total time of 20 minutes.  We spent approximately 35 to 40 minutes stopping the oozing.  After the skin was closed, the incision was clean and dry.  Drains hooked to suction and bulky sterile dressing  applied.  She was placed into a knee immobilizer, awakened, and transported to recovery in stable condition.     Ollen Gross, M.D.     FA/MEDQ  D:  02/15/2013  T:  02/16/2013  Job:  [id number]

## 2013-02-16 NOTE — Progress Notes (Signed)
Subjective: 1 Day Post-Op Procedure(s) (LRB): IRRIGATION AND DEBRIDIMENT RIGHT KNEE  WITH EXCHANGE OF  ANTIBIOTIC SPACERS (Right) Patient reports pain as 3 on 0-10 scale. No major issues this morning. Afebrile. Hbg8.7  Objective: Vital signs in last 24 hours: Temp:  [97.9 F (36.6 C)-100.6 F (38.1 C)] 98 F (36.7 C) (11/22 0603) Pulse Rate:  [73-89] 73 (11/22 0603) Resp:  [16-20] 16 (11/22 0603) BP: (139-167)/(70-78) 152/77 mmHg (11/22 0603) SpO2:  [97 %-99 %] 99 % (11/22 0603)  Intake/Output from previous day: 11/21 0701 - 11/22 0700 In: 3387.4 [P.O.:780; I.V.:2607.4] Out: 1980 [Urine:1775; Drains:55; Blood:150] Intake/Output this shift: Total I/O In: 120 [P.O.:120] Out: 224 [Urine:150; Drains:74]   Recent Labs  02/14/13 0352 02/15/13 0650 02/16/13 0325  HGB 9.0* 9.6* 8.7*    Recent Labs  02/15/13 0650 02/16/13 0325  WBC 12.6* 15.6*  RBC 3.22* 2.95*  HCT 29.2* 26.9*  PLT 474* 467*    Recent Labs  02/15/13 0650 02/16/13 0325  NA 128* 126*  K 4.2 4.8  CL 93* 92*  CO2 27 25  BUN 9 10  CREATININE 0.60 0.59  GLUCOSE 91 147*  CALCIUM 8.3* 8.3*   No results found for this basename: LABPT, INR,  in the last 72 hours  Compartment soft  Assessment/Plan: 1 Day Post-Op Procedure(s) (LRB): IRRIGATION AND DEBRIDIMENT RIGHT KNEE  WITH EXCHANGE OF  ANTIBIOTIC SPACERS (Right) Up with therapy  Scott Fix A 02/16/2013, 10:00 AM

## 2013-02-17 LAB — BASIC METABOLIC PANEL
CO2: 28 mEq/L (ref 19–32)
Calcium: 8.2 mg/dL — ABNORMAL LOW (ref 8.4–10.5)
Chloride: 100 mEq/L (ref 96–112)
Creatinine, Ser: 0.6 mg/dL (ref 0.50–1.10)
Glucose, Bld: 93 mg/dL (ref 70–99)

## 2013-02-17 LAB — CBC
HCT: 23.4 % — ABNORMAL LOW (ref 36.0–46.0)
Hemoglobin: 7.6 g/dL — ABNORMAL LOW (ref 12.0–15.0)
MCH: 29.9 pg (ref 26.0–34.0)
MCHC: 32.5 g/dL (ref 30.0–36.0)
MCV: 92.1 fL (ref 78.0–100.0)
Platelets: 418 10*3/uL — ABNORMAL HIGH (ref 150–400)
RBC: 2.54 MIL/uL — ABNORMAL LOW (ref 3.87–5.11)
WBC: 11.1 10*3/uL — ABNORMAL HIGH (ref 4.0–10.5)

## 2013-02-17 MED ORDER — BISACODYL 10 MG RE SUPP
10.0000 mg | Freq: Once | RECTAL | Status: AC
  Administered 2013-02-17: 08:00:00 10 mg via RECTAL
  Filled 2013-02-17: qty 1

## 2013-02-17 MED ORDER — METRONIDAZOLE 500 MG PO TABS
500.0000 mg | ORAL_TABLET | Freq: Three times a day (TID) | ORAL | Status: DC
  Administered 2013-02-17 – 2013-02-23 (×18): 500 mg via ORAL
  Filled 2013-02-17 (×22): qty 1

## 2013-02-17 NOTE — Progress Notes (Signed)
Subjective: 2 Days Post-Op Procedure(s) (LRB): IRRIGATION AND DEBRIDIMENT RIGHT KNEE  WITH EXCHANGE OF  ANTIBIOTIC SPACERS (Right) Patient reports pain as mild.   Patient seen in rounds with Dr. Lequita Halt. Patient is well, and has had no acute complaints or problems Plan is to go Skilled nursing facility after hospital stay.  Objective: Vital signs in last 24 hours: Temp:  [98.2 F (36.8 C)-98.4 F (36.9 C)] 98.2 F (36.8 C) (11/23 4098) Pulse Rate:  [67-77] 67 (11/23 0613) Resp:  [16-18] 16 (11/23 0613) BP: (111-137)/(58-64) 137/60 mmHg (11/23 0613) SpO2:  [100 %] 100 % (11/23 1191)  Intake/Output from previous day:  Intake/Output Summary (Last 24 hours) at 02/17/13 0729 Last data filed at 02/17/13 4782  Gross per 24 hour  Intake 2783.75 ml  Output   1718 ml  Net 1065.75 ml    Intake/Output this shift:    Labs:  Recent Labs  02/15/13 0650 02/16/13 0325  HGB 9.6* 8.7*    Recent Labs  02/15/13 0650 02/16/13 0325  WBC 12.6* 15.6*  RBC 3.22* 2.95*  HCT 29.2* 26.9*  PLT 474* 467*    Recent Labs  02/15/13 0650 02/16/13 0325  NA 128* 126*  K 4.2 4.8  CL 93* 92*  CO2 27 25  BUN 9 10  CREATININE 0.60 0.59  GLUCOSE 91 147*  CALCIUM 8.3* 8.3*   No results found for this basename: LABPT, INR,  in the last 72 hours  EXAM General - Patient is Alert, Appropriate and Oriented Extremity - Neurovascular intact Sensation intact distally Dorsiflexion/Plantar flexion intact Dressing/Incision - clean, slight drainage, incision holding, knee looks good. Motor Function - intact, moving foot and toes well on exam.   Past Medical History  Diagnosis Date  . Mixed hyperlipidemia   . Hypothyroidism   . GERD (gastroesophageal reflux disease)   . Atrial fibrillation     Remote amiodarone use, recurred in 07/2011 post op from knee arthroplasty  . Obese   . History of blood transfusion 08/31/2011    A+ type  . History of GI bleed     Gastritis, was on Xarelto and  ASA  . Carpal tunnel syndrome   . Arthritis     Knees  . DJD (degenerative joint disease), thoracolumbar   . Rheumatoid arthritis   . H/O bronchitis   . Hemorrhoids   . Measles     as child  . Mumps     as child  . Carotid artery disease   . PONV (postoperative nausea and vomiting)   . Anemia   . Difficulty sleeping   . 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   Weight as of this encounter: 68 kg (149 lb 14.6 oz). DVT Prophylaxis - SCD's  Weight-Bearing as tolerated to right leg but No Bending or No Motion    Continue  with ceftriaxone 2gm IV daily for 6 weeks are per Dr. Drue Second.  PICC line for probably 6 weeks of IV antibiotic therapy.  Culture  ABUNDANT GROUP B STREP(S.AGALACTIAE)ISOLATED    Dr. Lequita Halt switched the patient out from the hard collar to a soft collar. Wear at all times except may lossen up a little while in bed supported.   New cultures taken at time of repeat surgery.  Will follow to see if need any changes in treatment.  Marlane Hirschmann 02/17/2013, 7:29 AM

## 2013-02-18 ENCOUNTER — Encounter (HOSPITAL_COMMUNITY): Payer: Self-pay | Admitting: Orthopedic Surgery

## 2013-02-18 LAB — CBC
MCH: 29.4 pg (ref 26.0–34.0)
MCV: 91.3 fL (ref 78.0–100.0)
Platelets: 429 10*3/uL — ABNORMAL HIGH (ref 150–400)
RBC: 2.65 MIL/uL — ABNORMAL LOW (ref 3.87–5.11)
RDW: 16.2 % — ABNORMAL HIGH (ref 11.5–15.5)
WBC: 11.1 10*3/uL — ABNORMAL HIGH (ref 4.0–10.5)

## 2013-02-18 LAB — BASIC METABOLIC PANEL
CO2: 28 mEq/L (ref 19–32)
Calcium: 8.3 mg/dL — ABNORMAL LOW (ref 8.4–10.5)
Chloride: 97 mEq/L (ref 96–112)
Creatinine, Ser: 0.51 mg/dL (ref 0.50–1.10)
GFR calc Af Amer: >90 mL/min (ref >90)
Sodium: 132 mEq/L — ABNORMAL LOW (ref 135–145)

## 2013-02-18 NOTE — Progress Notes (Signed)
Clinical Social Work  Per Charity fundraiser, patient is not medically stable to DC today. CSW called and left a message for admission's coordinator regarding patient's DC plans. CSW will continue to follow.  Unk Lightning, LCSW (Coverage for [employer])

## 2013-02-18 NOTE — Progress Notes (Signed)
Subjective: 3 Days Post-Op Procedure(s) (LRB): IRRIGATION AND DEBRIDIMENT RIGHT KNEE  WITH EXCHANGE OF  ANTIBIOTIC SPACERS (Right) Patient reports pain as mild.   Patient seen in rounds with Dr. Lequita Halt. Patient is well, and has had no acute complaints or problems Plan is to go Skilled nursing facility after hospital stay.  Objective: Vital signs in last 24 hours: Temp:  [98.1 F (36.7 C)-99.5 F (37.5 C)] 99.5 F (37.5 C) (11/24 0545) Pulse Rate:  [78-90] 90 (11/24 0545) Resp:  [16-17] 17 (11/24 0545) BP: (125-186)/(62-76) 186/70 mmHg (11/24 0545) SpO2:  [95 %-97 %] 95 % (11/24 0545)  Intake/Output from previous day:  Intake/Output Summary (Last 24 hours) at 02/18/13 0727 Last data filed at 02/18/13 0600  Gross per 24 hour  Intake 2639.59 ml  Output   3020 ml  Net -380.41 ml    Intake/Output this shift:    Labs:  Recent Labs  02/16/13 0325 02/17/13 0829 02/18/13 0639  HGB 8.7* 7.6* 7.8*    Recent Labs  02/17/13 0829 02/18/13 0639  WBC 11.1* 11.1*  RBC 2.54* 2.65*  HCT 23.4* 24.2*  PLT 418* 429*    Recent Labs  02/17/13 0829 02/18/13 0639  NA 135 132*  K 4.0 4.1  CL 100 97  CO2 28 28  BUN 13 8  CREATININE 0.60 0.51  GLUCOSE 93 94  CALCIUM 8.2* 8.3*   No results found for this basename: LABPT, INR,  in the last 72 hours  EXAM General - Patient is Alert, Appropriate and Oriented Extremity - Neurovascular intact Sensation intact distally Dorsiflexion/Plantar flexion intact Dressing/Incision - clean, dry, incision looks much better than last week Motor Function - intact, moving foot and toes well on exam.  Drains removed today.  Past Medical History  Diagnosis Date  . Mixed hyperlipidemia   . Hypothyroidism   . GERD (gastroesophageal reflux disease)   . Atrial fibrillation     Remote amiodarone use, recurred in 07/2011 post op from knee arthroplasty  . Obese   . History of blood transfusion 08/31/2011    A+ type  . History of GI bleed      Gastritis, was on Xarelto and ASA  . Carpal tunnel syndrome   . Arthritis     Knees  . DJD (degenerative joint disease), thoracolumbar   . Rheumatoid arthritis   . H/O bronchitis   . Hemorrhoids   . Measles     as child  . Mumps     as child  . Carotid artery disease   . PONV (postoperative nausea and vomiting)   . Anemia   . Difficulty sleeping   . 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   Weight as of this encounter: 68 kg (149 lb 14.6 oz).  DVT Prophylaxis - SCD's  Weight-Bearing as tolerated to right leg but  No Bending or No Motion  WBC stable at 11.1 Na+ stable at 132  HGB down to 7.8 but stable from yesterday. Patient is asymptomatic at this time.  Continue with ceftriaxone 2gm IV daily for 6 weeks are per Dr. Drue Second.  PICC line for probably 6 weeks of IV antibiotic therapy.  Culture  ABUNDANT GROUP B STREP(S.AGALACTIAE)ISOLATED   Recent new culture shows Gram Negative Rods Incision looks much better following most recent I&D procedure. Dr. Lequita Halt switched the patient out from the hard collar to a soft collar. Wear at all times except may lossen up a little while in bed supported.   Hope Holst 02/18/2013, 7:27 AM

## 2013-02-18 NOTE — Progress Notes (Signed)
Physical Therapy Treatment Patient Details Name: AUNISTY REALI MRN: [id number] DOB: [birth year] Today's Date: 02/18/2013 Time: 8657-8469 PT Time Calculation (min): 8 min  PT Assessment / Plan / Recommendation  History of Present Illness 79 yo female s/p R knee I&D, resection, antibiotic spacer placed. Recent hx of fall (11/1) resulting in multiple cervical fractures requires soft collar at all times.    PT Comments   Pt only wished to be assisted to recliner today.  Follow Up Recommendations  SNF     Does the patient have the potential to tolerate intense rehabilitation     Barriers to Discharge        Equipment Recommendations  Rolling walker with 5" wheels;Wheelchair (measurements PT)    Recommendations for Other Services    Frequency Min 3X/week   Progress towards PT Goals Progress towards PT goals: Progressing toward goals  Plan Current plan remains appropriate    Precautions / Restrictions Precautions Precautions: Fall;Cervical Precaution Comments: soft collar, KI AAT NO ROM Required Braces or Orthoses: Cervical Brace;Knee Immobilizer - Right Knee Immobilizer - Right: On at all times Cervical Brace: At all times;Soft collar Restrictions Weight Bearing Restrictions: Yes RLE Weight Bearing: Touchdown weight bearing   Pertinent Vitals/Pain Pt reported discomfort in bed and agreeable to change positions to recliner, positioned to comfort    Mobility  Bed Mobility Bed Mobility: Supine to Sit Supine to Sit: 1: +2 Total assist Supine to Sit: Patient Percentage: 50% Details for Bed Mobility Assistance: assist bil LEs and slight assist for trunk Transfers Transfers: Sit to Stand;Stand to Sit;Stand Pivot Transfers Sit to Stand: 1: +2 Total assist;From bed Sit to Stand: Patient Percentage: 60% Stand to Sit: To chair/3-in-1;1: +2 Total assist Stand to Sit: Patient Percentage: 60% Stand Pivot Transfers: 1: +2 Total assist Stand Pivot Transfers: Patient Percentage:  60% Details for Transfer Assistance: verbal cues for LE and UE placement including keeping R LE forward and TDWB only as well as reaching for armrests with stand pivots Wheelchair Mobility Wheelchair Mobility: No (pt declined today)    Exercises     PT Diagnosis:    PT Problem List:   PT Treatment Interventions:     PT Goals (current goals can now be found in the care plan section)    Visit Information  Last PT Received On: 02/18/13 Assistance Needed: +2 History of Present Illness: 79 yo female s/p R knee I&D, resection, antibiotic spacer placed. Recent hx of fall (11/1) resulting in multiple cervical fractures requires soft collar at all times.     Subjective Data      Cognition  Cognition Arousal/Alertness: Awake/alert Behavior During Therapy: WFL for tasks assessed/performed Overall Cognitive Status: Within Functional Limits for tasks assessed    Balance     End of Session PT - End of Session Equipment Utilized During Treatment: Cervical collar;Right knee immobilizer Activity Tolerance: Patient tolerated treatment well Patient left: in chair;with call bell/phone within reach   GP     Deshay Blumenfeld,KATHrine E 02/18/2013, 1:34 PM Zenovia Jarred, PT, DPT 02/18/2013 Pager: [phone]

## 2013-02-19 DIAGNOSIS — E43 Unspecified severe protein-calorie malnutrition: Secondary | ICD-10-CM | POA: Diagnosis present

## 2013-02-19 LAB — BASIC METABOLIC PANEL
CO2: 27 mEq/L (ref 19–32)
Calcium: 8.3 mg/dL — ABNORMAL LOW (ref 8.4–10.5)
Chloride: 95 mEq/L — ABNORMAL LOW (ref 96–112)
Creatinine, Ser: 0.52 mg/dL (ref 0.50–1.10)
GFR calc Af Amer: >90 mL/min (ref >90)
Potassium: 4.3 mEq/L (ref 3.5–5.1)

## 2013-02-19 LAB — WOUND CULTURE

## 2013-02-19 LAB — CBC
HCT: 24 % — ABNORMAL LOW (ref 36.0–46.0)
Hemoglobin: 7.8 g/dL — ABNORMAL LOW (ref 12.0–15.0)
MCV: 90.9 fL (ref 78.0–100.0)
RBC: 2.64 MIL/uL — ABNORMAL LOW (ref 3.87–5.11)
RDW: 16.2 % — ABNORMAL HIGH (ref 11.5–15.5)
WBC: 12.7 10*3/uL — ABNORMAL HIGH (ref 4.0–10.5)

## 2013-02-19 MED ORDER — VANCOMYCIN HCL IN DEXTROSE 750-5 MG/150ML-% IV SOLN
750.0000 mg | Freq: Two times a day (BID) | INTRAVENOUS | Status: DC
  Administered 2013-02-20 – 2013-02-21 (×4): 750 mg via INTRAVENOUS
  Filled 2013-02-19 (×5): qty 150

## 2013-02-19 MED ORDER — VANCOMYCIN HCL 10 G IV SOLR
1500.0000 mg | Freq: Once | INTRAVENOUS | Status: AC
  Administered 2013-02-19: 1500 mg via INTRAVENOUS
  Filled 2013-02-19: qty 1500

## 2013-02-19 NOTE — Progress Notes (Signed)
    [facility]    Date of Admission:  01/28/2013           Day 19 ceftriaxone        Day 3 metronidazole           ID: [name] [name] is a 79 y.o. 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 But still having knee pain  Medications:  . ALPRAZolam  0.5 mg Oral Daily   And  . ALPRAZolam  1 mg Oral QHS  . cefTRIAXone (ROCEPHIN)  IV  2 g Intravenous Q24H  . docusate sodium  100 mg Oral BID  . esomeprazole  40 mg Oral Daily  . feeding supplement (ENSURE COMPLETE)  237 mL Oral TID WC  . feeding supplement (RESOURCE BREEZE)  1 Container Oral Q24H  . ferrous sulfate  325 mg Oral BID  . fesoterodine  8 mg Oral Daily  . gabapentin  300 mg Oral BID  . levothyroxine  150 mcg Oral QAC breakfast  . metroNIDAZOLE  500 mg Oral Q8H  . multivitamin with minerals  1 tablet Oral Daily  . simvastatin  20 mg Oral q morning - 10a    Objective: Vital signs in last 24 hours: Temp:  [98 F (36.7 C)-99.2 F (37.3 C)] 98.6 F (37 C) (11/25 1334) Pulse Rate:  [76-92] 92 (11/25 1334) Resp:  [16] 16 (11/25 1334) BP: (138-175)/(70-77) 138/77 mmHg (11/25 1334) SpO2:  [96 %-100 %] 100 % (11/25 1334)    Lab Results  Recent Labs  02/18/13 0639 02/19/13 0445  WBC 11.1* 12.7*  HGB 7.8* 7.8*  HCT 24.2* 24.0*  NA 132* 130*  K 4.1 4.3  CL 97 95*  CO2 28 27  BUN 8 7  CREATININE 0.51 0.52    Microbiology: 11/21 enterococcus amp S 11/3 wound cx group b strep Studies/Results: No results  found.   Assessment/Plan: 79 yo F with prosthetic joint infection with group b strep and enterococcus will need prolonged antibiotics x 6 wks Due to penicillin allergy unable to give ampicillin.  - will switch antibiotics to vancomycin with goal trough of 15-20. Will consult pharmacy to dose and schedule  Kanan Sobek, [facility] Cell: [phone] Pager: [phone]  02/19/2013, 6:59 PM

## 2013-02-19 NOTE — Progress Notes (Signed)
Subjective: 4 Days Post-Op Procedure(s) (LRB): IRRIGATION AND DEBRIDIMENT RIGHT KNEE  WITH EXCHANGE OF  ANTIBIOTIC SPACERS (Right) Patient reports pain as mild and moderate.   Patient seen in rounds with Dr. Lequita Halt. Patient is well, but has had some minor complaints of pain in the knee, requiring pain medications Plan is to go Skilled nursing facility after hospital stay.  Objective: Vital signs in last 24 hours: Temp:  [98 F (36.7 C)-99.2 F (37.3 C)] 99.2 F (37.3 C) (11/25 0548) Pulse Rate:  [76-84] 84 (11/25 0548) Resp:  [16] 16 (11/25 0548) BP: (130-175)/(63-72) 175/72 mmHg (11/25 0548) SpO2:  [95 %-96 %] 96 % (11/25 0548)  Intake/Output from previous day:  Intake/Output Summary (Last 24 hours) at 02/19/13 0859 Last data filed at 02/19/13 0830  Gross per 24 hour  Intake   1420 ml  Output   2250 ml  Net   -830 ml    Intake/Output this shift: Total I/O In: 240 [P.O.:240] Out: -   Labs:  Recent Labs  02/17/13 0829 02/18/13 0639 02/19/13 0445  HGB 7.6* 7.8* 7.8*    Recent Labs  02/18/13 0639 02/19/13 0445  WBC 11.1* 12.7*  RBC 2.65* 2.64*  HCT 24.2* 24.0*  PLT 429* 470*    Recent Labs  02/18/13 0639 02/19/13 0445  NA 132* 130*  K 4.1 4.3  CL 97 95*  CO2 28 27  BUN 8 7  CREATININE 0.51 0.52  GLUCOSE 94 96  CALCIUM 8.3* 8.3*   No results found for this basename: LABPT, INR,  in the last 72 hours  EXAM General - Patient is Alert and Appropriate Extremity - Neurovascular intact Sensation intact distally Dorsiflexion/Plantar flexion intact Dressing/Incision - clean, dry, no drainage, incision is holding.  Edges look good. Showing no signs of dehiscence at this time. Motor Function - intact, moving foot and toes well on exam.   Past Medical History  Diagnosis Date  . Mixed hyperlipidemia   . Hypothyroidism   . GERD (gastroesophageal reflux disease)   . Atrial fibrillation     Remote amiodarone use, recurred in 07/2011 post op from knee  arthroplasty  . Obese   . History of blood transfusion 08/31/2011    A+ type  . History of GI bleed     Gastritis, was on Xarelto and ASA  . Carpal tunnel syndrome   . Arthritis     Knees  . DJD (degenerative joint disease), thoracolumbar   . Rheumatoid arthritis   . H/O bronchitis   . Hemorrhoids   . Measles     as child  . Mumps     as child  . Carotid artery disease   . PONV (postoperative nausea and vomiting)   . Anemia   . Difficulty sleeping   . 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  Weight as of this encounter: 68 kg (149 lb 14.6 oz).  DVT Prophylaxis - SCD's  Weight Bearing Status has Changed to TDWB ONLY to right leg but No Bending or No Motion  WBC bumped up a little to 12.7 Na+ stable at 130 HGB stable at 7.8 from yesterday. Patient is asymptomatic at this time.  Continue with ceftriaxone 2gm IV daily for 6 weeks are per Dr. Drue Second.  PICC line for probably 6 weeks of IV antibiotic therapy.  Culture  ABUNDANT GROUP B STREP(S.AGALACTIAE)ISOLATED from original cultures.   Recent new culture shows Gram Negative Rods - FEW ENTEROCOCCUS SPECIES  Incision looks much better following most recent I&D procedure.  Dr. Lequita Halt switched the patient out from the hard collar to a soft collar. Wear at all times except may  lossen up a little while in bed supported.   [name] 02/19/2013, 8:59 AM

## 2013-02-19 NOTE — Progress Notes (Signed)
ANTIBIOTIC CONSULT NOTE - INITIAL  Pharmacy Consult for vancomycin Indication: prosthetic joint infection  Allergies  Allergen Reactions  . Omeprazole Nausea And Vomiting  . Penicillins Itching  . Morphine Rash    And hot flashes  . Xarelto [Rivaroxaban] Other (See Comments)    Bleeding,spitting up blood    Patient Measurements: Height: 5' 5.35" (166 cm) (Documented in chart 01/25/13) Weight: 149 lb 14.6 oz (68 kg) (Documented in chart 01/25/13) IBW/kg (Calculated) : 57.81 Adjusted Body Weight:   Vital Signs: Temp: 98.6 F (37 C) (11/25 1334) BP: 138/77 mmHg (11/25 1334) Pulse Rate: 92 (11/25 1334) Intake/Output from previous day: 11/24 0701 - 11/25 0700 In: 1180 [P.O.:720; I.V.:460] Out: 2250 [Urine:2250] Intake/Output from this shift:    Labs:  Recent Labs  02/17/13 0829 02/18/13 0639 02/19/13 0445  WBC 11.1* 11.1* 12.7*  HGB 7.6* 7.8* 7.8*  PLT 418* 429* 470*  CREATININE 0.60 0.51 0.52   Estimated Creatinine Clearance: 51.2 ml/min (by C-G formula based on Cr of 0.52). No results found for this basename: VANCOTROUGH, Leodis Binet, VANCORANDOM, GENTTROUGH, GENTPEAK, GENTRANDOM, TOBRATROUGH, TOBRAPEAK, TOBRARND, AMIKACINPEAK, AMIKACINTROU, AMIKACIN,  in the last 72 hours   Microbiology: Recent Results (from the past 720 hour(s))  SURGICAL PCR SCREEN     Status: None   Collection Time    01/25/13 10:31 AM      Result Value Range Status   MRSA, PCR NEGATIVE  NEGATIVE Final   Staphylococcus aureus NEGATIVE  NEGATIVE Final   Comment:            The Xpert SA Assay (FDA     approved for NASAL specimens     in patients over 18 years of age),     is one component of     a comprehensive surveillance     program.  Test performance has     been validated by [employer] for patients greater     than or equal to 47 year old.     It is not intended     to diagnose infection nor to     guide or monitor treatment.  WOUND CULTURE     Status: None   Collection  Time    01/28/13  6:02 PM      Result Value Range Status   Specimen Description WOUND RIGHT KNEE   Final   Special Requests PATIENT ON FOLLOWING VANCOMYACIN   Final   Gram Stain     Final   Value: MODERATE WBC PRESENT, PREDOMINANTLY PMN     NO SQUAMOUS EPITHELIAL CELLS SEEN     ABUNDANT GRAM POSITIVE COCCI     IN PAIRS FEW GRAM NEGATIVE RODS     Performed at [facility]   Culture     Final   Value: ABUNDANT GROUP B STREP(S.AGALACTIAE)ISOLATED     Note: TESTING AGAINST S. AGALACTIAE NOT ROUTINELY PERFORMED DUE TO PREDICTABILITY OF AMP/PEN/VAN SUSCEPTIBILITY.     Performed at [facility]   Report Status 01/31/2013 FINAL   Final  ANAEROBIC CULTURE     Status: None   Collection Time    01/28/13  6:02 PM      Result Value Range Status   Specimen Description WOUND RIGHT KNEE   Final   Special Requests PATIENT ON FOLLOWING VANCOMYCIN   Final   Gram Stain     Final   Value: MODERATE WBC PRESENT, PREDOMINANTLY PMN     NO SQUAMOUS EPITHELIAL CELLS SEEN  ABUNDANT GRAM POSITIVE COCCI     IN PAIRS FEW GRAM NEGATIVE RODS     Performed at [facility]   Culture     Final   Value: BACTEROIDES SPECIES     Note: BETA LACTAMASE POSITIVE     Performed at [facility]   Report Status 02/04/2013 FINAL   Final  CULTURE, BLOOD (ROUTINE X 2)     Status: None   Collection Time    02/05/13  6:15 PM      Result Value Range Status   Specimen Description BLOOD LEFT HAND   Final   Special Requests BOTTLES DRAWN AEROBIC AND ANAEROBIC 10CC   Final   Culture  Setup Time     Final   Value: 02/05/2013 22:14     Performed at [facility]   Culture     Final   Value: NO GROWTH 5 DAYS     Performed at [facility]   Report Status 02/11/2013 FINAL   Final  CULTURE, BLOOD (ROUTINE X 2)     Status: None   Collection Time    02/05/13  6:45 PM      Result Value Range Status   Specimen Description BLOOD LEFT ARM   Final   Special Requests BOTTLES DRAWN  AEROBIC AND ANAEROBIC 10CC   Final   Culture  Setup Time     Final   Value: 02/05/2013 22:15     Performed at [facility]   Culture     Final   Value: NO GROWTH 5 DAYS     Performed at [facility]   Report Status 02/11/2013 FINAL   Final  URINE CULTURE     Status: None   Collection Time    02/05/13  8:01 PM      Result Value Range Status   Specimen Description URINE, RANDOM   Final   Special Requests Normal   Final   Culture  Setup Time     Final   Value: 02/06/2013 01:09     Performed at [facility]   Culture     Final   Value: NO GROWTH     Performed at [facility]   Report Status 02/06/2013 FINAL   Final  CLOSTRIDIUM DIFFICILE BY PCR     Status: None   Collection Time    02/08/13 12:00 AM      Result Value Range Status   C difficile by pcr NEGATIVE  NEGATIVE Final   Comment: Performed at [facility]  WOUND CULTURE     Status: None   Collection Time    02/15/13  6:35 PM      Result Value Range Status   Specimen Description KNEE RIGHT   Final   Special Requests ROCEPHIN   Final   Gram Stain     Final   Value: ABUNDANT WBC PRESENT,BOTH PMN AND MONONUCLEAR     NO SQUAMOUS EPITHELIAL CELLS SEEN     ABUNDANT GRAM NEGATIVE RODS     Performed at [facility]   Culture     Final   Value: FEW ENTEROCOCCUS SPECIES     Performed at [facility]   Report Status 02/19/2013 FINAL   Final   Organism ID, Bacteria ENTEROCOCCUS SPECIES   Final  ANAEROBIC CULTURE     Status: None   Collection Time    02/15/13  6:35 PM      Result Value Range Status  Specimen Description KNEE RIGHT   Final   Special Requests ROCEPHIN   Final   Gram Stain     Final   Value: ABUNDANT WBC PRESENT,BOTH PMN AND MONONUCLEAR     NO SQUAMOUS EPITHELIAL CELLS SEEN     ABUNDANT GRAM NEGATIVE RODS     Performed at [facility]   Culture     Final   Value: NO ANAEROBES ISOLATED; CULTURE IN PROGRESS FOR 5 DAYS     Performed at [facility]   Report Status PENDING   Incomplete    Medical History: Past Medical History  Diagnosis Date  . Mixed hyperlipidemia   . Hypothyroidism   . GERD (gastroesophageal reflux disease)   . Atrial fibrillation     Remote amiodarone use, recurred in 07/2011 post op from knee arthroplasty  . Obese   . History of blood transfusion 08/31/2011    A+ type  . History of GI bleed     Gastritis, was on Xarelto and ASA  . Carpal tunnel syndrome   . Arthritis     Knees  . DJD (degenerative joint disease), thoracolumbar   . Rheumatoid arthritis   . H/O bronchitis   . Hemorrhoids   . Measles     as child  . Mumps     as child  . Carotid artery disease   . PONV (postoperative nausea and vomiting)   . Anemia   . Difficulty sleeping   . Multiple bruises     DUE TO FALL 01/24/13   Assessment: 79 yo F with septic arthritis of knee s/p resection of R TKR and placement of antibiotic spacer 11/3. Enterococcus growing from R knee culture currently on ceftriaxone to cover GBS, cephalosporins do not cover enterococcus so ID has changed ceftriaxone to vancomycin to cover both organisms (unable to use ampicillin d/t PCN allergy).  Metronidazole continues for bacteroides.  11/03 >> Vancomycin >>11/5 11/4 >> Cefepime >> 11/5 11/5 >> Ceftriaxone >> 11/25 11/23 >> Flagyl >>  11/25 >> vancomycin >>  Tmax: afeb WBCs: improved to 12.7 Renal: Scr = 0.68 for CrCL 51.2 ml/min (CG), 25ml/min (N, using Scr rounded up to 0.8 for age)  11/3: intra-op wound cx >> GBS 11/3: anaerboic wound >> Bacteroides species 11/11 blood x 2 >> NGF 11/11 urine >> negative 11/14 C.diff >> negative 11/21 R Knee >> enterococcus species (Amp sens)   Goal of Therapy:  Vancomycin trough level 15-20 mcg/ml  Plan:   Based on vancomycin trough (and similar SCr) in sept 2014 (Vanc trough = 16.3 mcg/ml on 750mg  IV q12h), start vancomycin 1500mg  IV x 1 then 750mg  IV q12h  Check steady state trough (consider repeating  several days after this to rule out accumulation)  Will need at minimum weekly vanco trough during 6-wk course   [name] [name], PharmD, BCPS.   Pager: [phone]  02/19/2013,7:18 PM

## 2013-02-20 DIAGNOSIS — E43 Unspecified severe protein-calorie malnutrition: Secondary | ICD-10-CM

## 2013-02-20 LAB — CBC
HCT: 23.6 % — ABNORMAL LOW (ref 36.0–46.0)
Hemoglobin: 7.6 g/dL — ABNORMAL LOW (ref 12.0–15.0)
MCH: 29.2 pg (ref 26.0–34.0)
MCHC: 32.2 g/dL (ref 30.0–36.0)
MCV: 90.8 fL (ref 78.0–100.0)

## 2013-02-20 LAB — BASIC METABOLIC PANEL
BUN: 7 mg/dL (ref 6–23)
CO2: 27 mEq/L (ref 19–32)
Chloride: 95 mEq/L — ABNORMAL LOW (ref 96–112)
Creatinine, Ser: 0.53 mg/dL (ref 0.50–1.10)
GFR calc Af Amer: >90 mL/min (ref >90)
Glucose, Bld: 95 mg/dL (ref 70–99)
Potassium: 4.1 mEq/L (ref 3.5–5.1)
Sodium: 130 mEq/L — ABNORMAL LOW (ref 135–145)

## 2013-02-20 NOTE — Anesthesia Postprocedure Evaluation (Signed)
  Anesthesia Post-op Note  Patient: [name] [name]  Procedure(s) Performed: Procedure(s) (LRB): IRRIGATION AND DEBRIDIMENT RIGHT KNEE  WITH EXCHANGE OF  ANTIBIOTIC SPACERS (Right)  Patient Location: PACU  Anesthesia Type: General  Level of Consciousness: awake and alert   Airway and Oxygen Therapy: Patient Spontanous Breathing  Post-op Pain: mild  Post-op Assessment: Post-op Vital signs reviewed, Patient's Cardiovascular Status Stable, Respiratory Function Stable, Patent Airway and No signs of Nausea or vomiting  Last Vitals:  Filed Vitals:   02/20/13 0644  BP: 134/71  Pulse: 78  Temp: 36.9 C  Resp: 16    Post-op Vital Signs: stable   Complications: No apparent anesthesia complications

## 2013-02-20 NOTE — Progress Notes (Signed)
NUTRITION FOLLOW UP  Intervention:   Continue Ensure TID D/C Resource Breeze Continue MVI with minerals daily  Nutrition Dx:   Inadequate oral intake related to decreased appetite as evidenced by 11% wt loss; ongoing, varied po intake 0-75% of most meals  Goal:   Pt to meet >/= 90% of their estimated nutrition needs; not likely met  Monitor:   Meal completion, acceptance of supplements, wound healing, labs  Assessment:   79 y.o. 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 She does not like [employer] and has not been drinking it. She understands the importance of getting adequate calories and protein for wound healing. Meal completion has been around 25-50% for the past two days.  Height: Ht Readings from Last 1 Encounters:  01/28/13 5' 5.35" (1.66 m)    Weight Status:   Wt Readings from Last 1 Encounters:  01/28/13 149 lb 14.6 oz (68 kg)    Re-estimated needs:  Kcal: 2000-2300 Protein: 90-100 g Fluid: 2.0-2.3 L  Skin: Incision on right knee  Diet Order: General   Intake/Output Summary (Last 24 hours) at 02/20/13 1027 Last data filed at 02/20/13 0903  Gross per 24 hour  Intake    240 ml  Output   3100 ml  Net  -2860 ml    Last BM: 11/26   Labs:   Recent Labs Lab 02/18/13 0639 02/19/13 0445 02/20/13 0410  NA 132* 130* 130*  K 4.1 4.3 4.1  CL 97 95* 95*  CO2 28 27 27   BUN 8 7 7   CREATININE 0.51 0.52 0.53  CALCIUM 8.3* 8.3* 8.3*  GLUCOSE 94 96 95    CBG (last 3)  No results found for this basename: GLUCAP,  in the last 72 hours  Scheduled Meds: . ALPRAZolam  0.5 mg Oral Daily   And  . ALPRAZolam  1 mg Oral QHS  . docusate sodium  100 mg Oral BID  . esomeprazole  40 mg Oral Daily  . feeding supplement (ENSURE COMPLETE)  237 mL Oral TID WC  . feeding supplement (RESOURCE BREEZE)  1 Container Oral Q24H   . ferrous sulfate  325 mg Oral BID  . fesoterodine  8 mg Oral Daily  . gabapentin  300 mg Oral BID  . levothyroxine  150 mcg Oral QAC breakfast  . metroNIDAZOLE  500 mg Oral Q8H  . multivitamin with minerals  1 tablet Oral Daily  . simvastatin  20 mg Oral q morning - 10a  . vancomycin  750 mg Intravenous Q12H    Continuous Infusions: . sodium chloride 20 mL/hr at 02/19/13 0843    Ebbie Latus RD, LDN

## 2013-02-20 NOTE — Progress Notes (Signed)
Subjective: 5 Days Post-Op Procedure(s) (LRB): IRRIGATION AND DEBRIDIMENT RIGHT KNEE  WITH EXCHANGE OF  ANTIBIOTIC SPACERS (Right) Patient reports pain as moderate.   Patient seen in rounds for Dr. Lequita Halt. Patient is having problems with pain in the knee today, requiring pain medications Plan is to go Skilled nursing facility after hospital stay. She had a bowel movement yesterday but developed diarrhea today.  Monitor and if continues then check for c.diff.  Objective: Vital signs in last 24 hours: Temp:  [98.4 F (36.9 C)-98.7 F (37.1 C)] 98.7 F (37.1 C) (11/26 1322) Pulse Rate:  [74-81] 81 (11/26 1322) Resp:  [16] 16 (11/26 1322) BP: (113-134)/(60-77) 127/77 mmHg (11/26 1322) SpO2:  [95 %-99 %] 99 % (11/26 1322)  Intake/Output from previous day:  Intake/Output Summary (Last 24 hours) at 02/20/13 1403 Last data filed at 02/20/13 1323  Gross per 24 hour  Intake    240 ml  Output   3700 ml  Net  -3460 ml    Intake/Output this shift: Total I/O In: 240 [P.O.:240] Out: 1050 [Urine:1050]  Labs:  Recent Labs  02/18/13 0639 02/19/13 0445 02/20/13 0410  HGB 7.8* 7.8* 7.6*    Recent Labs  02/19/13 0445 02/20/13 0410  WBC 12.7* 12.4*  RBC 2.64* 2.60*  HCT 24.0* 23.6*  PLT 470* 445*    Recent Labs  02/19/13 0445 02/20/13 0410  NA 130* 130*  K 4.3 4.1  CL 95* 95*  CO2 27 27  BUN 7 7  CREATININE 0.52 0.53  GLUCOSE 96 95  CALCIUM 8.3* 8.3*   No results found for this basename: LABPT, INR,  in the last 72 hours  EXAM General - Patient is Alert and Appropriate Extremity - Neurovascular intact Sensation intact distally Dressing - clean, dry Motor Function - intact, moving foot and toes well on exam.   Past Medical History  Diagnosis Date  . Mixed hyperlipidemia   . Hypothyroidism   . GERD (gastroesophageal reflux disease)   . Atrial fibrillation     Remote amiodarone use, recurred in 07/2011 post op from knee arthroplasty  . Obese   . History  of blood transfusion 08/31/2011    A+ type  . History of GI bleed     Gastritis, was on Xarelto and ASA  . Carpal tunnel syndrome   . Arthritis     Knees  . DJD (degenerative joint disease), thoracolumbar   . Rheumatoid arthritis   . H/O bronchitis   . Hemorrhoids   . Measles     as child  . Mumps     as child  . Carotid artery disease   . PONV (postoperative nausea and vomiting)   . Anemia   . Difficulty sleeping   . 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   Weight as  of this encounter: 68 kg (149 lb 14.6 oz). Up with therapy Continue ABX therapy due to infection  DVT Prophylaxis - SCD's  Weight Bearing Status - TDWB ONLY to right leg but No Bending or No Motion  WBC stable at 12.7  Na+ stable at 130  HGB stable at 7.8 from yesterday. Patient is asymptomatic at this time.  Switched antibiotics to vancomycin with goal trough of 15-20. Dr. Drue Second consulted pharmacy to dose and schedule. PICC line for probably 6 weeks of IV antibiotic therapy.  Culture  ABUNDANT GROUP B STREP(S.AGALACTIAE)ISOLATED from original cultures.   Recent new culture shows Gram Negative Rods - ENTEROCOCCUS SPECIES  Incision looks much better following most recent I&D procedure.  Dr. Lequita Halt switched the patient out  from the hard collar to a soft collar. Wear at all times except may lossen up a little while in bed supported.   Risk Factors:  Nutritional assessment 02/08/13  Pt meets criteria for Severe MALNUTRITION in the context of Chronic Illness as evidenced by 11% wt loss in less than 4 months, severe muscle and fat wasting, and estimated energy intake <75% of estimated energy needs for >3 months.  Signs & Symptoms:  01/28/13 5' 5.35" (1.66 m)  01/28/13 149 lb 14.6 oz (68 kg)  BMI: Body mass index is 24.68 kg/(m^2).  Diagnostics:  Treatment  Continue Ensure Complete TID  Encourage PO intake  Nutrition Consult done  Patrica Duel 02/20/2013, 2:03 PM

## 2013-02-20 NOTE — Progress Notes (Signed)
PT Cancellation Note  Patient Details Name: SHYTERIA LEWIS MRN: [id number] DOB: [birth year]   Cancelled Treatment:    Reason Eval/Treat Not Completed: Fatigue/lethargy limiting ability to participate Pt reports nauseated today and did not wish to participate.   Hollye Pritt,KATHrine E 02/20/2013, 1:15 PM Zenovia Jarred, PT, DPT 02/20/2013 Pager: [phone]

## 2013-02-21 DIAGNOSIS — E871 Hypo-osmolality and hyponatremia: Secondary | ICD-10-CM

## 2013-02-21 DIAGNOSIS — B952 Enterococcus as the cause of diseases classified elsewhere: Secondary | ICD-10-CM

## 2013-02-21 LAB — CBC
Hemoglobin: 7.6 g/dL — ABNORMAL LOW (ref 12.0–15.0)
MCH: 28.9 pg (ref 26.0–34.0)
MCHC: 31.8 g/dL (ref 30.0–36.0)
Platelets: 465 10*3/uL — ABNORMAL HIGH (ref 150–400)
RDW: 16.9 % — ABNORMAL HIGH (ref 11.5–15.5)
WBC: 10.2 10*3/uL (ref 4.0–10.5)

## 2013-02-21 LAB — BASIC METABOLIC PANEL
BUN: 6 mg/dL (ref 6–23)
Calcium: 8.1 mg/dL — ABNORMAL LOW (ref 8.4–10.5)
Creatinine, Ser: 0.57 mg/dL (ref 0.50–1.10)
GFR calc non Af Amer: 85 mL/min — ABNORMAL LOW (ref >90)
Glucose, Bld: 96 mg/dL (ref 70–99)
Potassium: 4.1 mEq/L (ref 3.5–5.1)
Sodium: 129 mEq/L — ABNORMAL LOW (ref 135–145)

## 2013-02-21 NOTE — Progress Notes (Signed)
Physical Therapy Treatment Patient Details Name: MAKALYA NAVE MRN: [id number] DOB: [birth year] Today's Date: 02/21/2013 Time: 0730-0810  (40 min)  1ta, 1gt, 1 wc management            8:47 - 9:00  (13 min)  1ta   PT Assessment / Plan / Recommendation  History of Present Illness 79 yo female s/p R knee I&D, resection, antibiotic spacer placed. Recent hx of fall (11/1) resulting in multiple cervical fractures requires soft collar at all times.    PT Comments   Assisted pt OOB to [facility].  Assisted with hygiene.  Assisted to [facility] for mobility for w/c mobility.  Required a brief rest break due to fatigue. Assisted back to recliner with increased time to position to comfort.   Follow Up Recommendations  SNF     Does the patient have the potential to tolerate intense rehabilitation     Barriers to Discharge        Equipment Recommendations  Rolling walker with 5" wheels;Wheelchair (measurements PT)    Recommendations for Other Services    Frequency Min 3X/week   Progress towards PT Goals Progress towards PT goals: Progressing toward goals  Plan      Precautions / Restrictions Precautions Precautions: Fall;Cervical Precaution Comments: soft collar, KI AAT NO ROM Cervical Brace: At all times;Soft collar Restrictions Weight Bearing Restrictions: Yes RLE Weight Bearing: Touchdown weight bearing    Pertinent Vitals/Pain C/o 3/10 knee pain with act Pre medicated    Mobility  Bed Mobility Bed Mobility: Supine to Sit Supine to Sit: 3: Mod assist Details for Bed Mobility Assistance: assist bil LEs and slight assist for trunk Transfers Transfers: Sit to Stand;Stand to Sit Sit to Stand: 2: Max assist;From bed;From toilet;From chair/3-in-1;Other (comment) (wheelchair) Stand to Sit: To chair/3-in-1;To toilet;2: Max assist;Other (comment) (wheelchair) Details for Transfer Assistance: verbal cues for LE and UE placement including keeping R LE forward and TDWB only as well as reaching for  armrests with stand pivots Ambulation/Gait Ambulation/Gait Assistance Details: performed transfers only from bed to [facility] then to wheelchair then back to recliner.  Wheelchair mobility mod/min assist 78' with increased assist with turns and backward   PT Goals (current goals can now be found in the care plan section)    Visit Information  Last PT Received On: 02/21/13 Assistance Needed: +2 History of Present Illness: 79 yo female s/p R knee I&D, resection, antibiotic spacer placed. Recent hx of fall (11/1) resulting in multiple cervical fractures requires soft collar at all times.     Subjective Data      Cognition       Balance     End of Session PT - End of Session Equipment Utilized During Treatment: Cervical collar;Right knee immobilizer Activity Tolerance: Patient tolerated treatment well Patient left: in chair;with call bell/phone within reach   Felecia Shelling  PTA [facility]  Acute  Rehab Pager      [phone]

## 2013-02-21 NOTE — Progress Notes (Signed)
[facility]    Date of Admission:  01/28/2013           Day 19 ceftriaxone- d/c'd on 11/25        Day 3 metronidazole                Day 3 vanco   ID: [name] [name] is a 79 y.o. 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   Medications:  . ALPRAZolam  0.5 mg Oral Daily   And  . ALPRAZolam  1 mg Oral QHS  . docusate sodium  100 mg Oral BID  . esomeprazole  40 mg Oral Daily  . feeding supplement (ENSURE COMPLETE)  237 mL Oral TID WC  . ferrous sulfate  325 mg Oral BID  . fesoterodine  8 mg Oral Daily  . gabapentin  300 mg Oral BID  . levothyroxine  150 mcg Oral QAC breakfast  . metroNIDAZOLE  500 mg Oral Q8H  . multivitamin with minerals  1 tablet Oral Daily  . simvastatin  20 mg Oral q morning - 10a  . vancomycin  750 mg Intravenous Q12H    Objective: Vital signs in last 24 hours: Temp:  [98.2 F (36.8 C)-98.7 F (37.1 C)] 98.6 F (37 C) (11/27 0520) Pulse Rate:  [73-81] 79 (11/27 0520) Resp:  [16-20] 20 (11/27 0520) BP: (127-156)/(64-77) 156/66 mmHg (11/27 0520) SpO2:  [95 %-99 %] 95 % (11/27 0520)  Physical Exam  Constitutional:  oriented to person, place, and time.  appears well-developed and well-nourished. No distress.  HENT:  Mouth/Throat: Oropharynx is clear and moist. No oropharyngeal exudate.  Cardiovascular: Normal rate, regular rhythm and normal heart sounds. Exam reveals no gallop and no  friction rub.  No murmur heard.  Pulmonary/Chest: Effort normal and breath sounds normal. No respiratory distress. He has no wheezes.  Abdominal: Soft. Bowel sounds are normal. He exhibits no distension. There is no tenderness.  Lymphadenopathy:  no cervical adenopathy.  Ext: right leg wrapped from surgery Skin: Skin is warm and dry. No rash noted. No erythema.      Lab Results  Recent Labs  02/20/13 0410 02/21/13 0545  WBC 12.4* 10.2  HGB 7.6* 7.6*  HCT 23.6* 23.9*  NA 130* 129*  K 4.1 4.1  CL 95* 95*  CO2 27 26  BUN 7 6  CREATININE 0.53 0.57    Microbiology: 11/21 enterococcus amp S 11/3 wound cx group b strep Studies/Results: No results found.   Assessment/Plan: 79 yo F with prosthetic joint infection with group b strep and enterococcus will need prolonged antibiotics x 6 wks Due to penicillin allergy unable to give ampicillin.  - continue with vancomycin with goal trough of 15-20. Thus far, no impact on cr clearance - will treat for total of 6 wks, using 11/25 as start date  Hyponatremia = appears to be below her normal baseline. Continue to monitor bmp daily.   [name], [facility]  [facility]fect[facility] Cell: [phone] Pager: [phone]  02/21/2013, 11:06 AM

## 2013-02-21 NOTE — Progress Notes (Signed)
   Subjective: 6 Days Post-Op Procedure(s) (LRB): IRRIGATION AND DEBRIDIMENT RIGHT KNEE  WITH EXCHANGE OF  ANTIBIOTIC SPACERS (Right) Patient reports pain as mild.   Was out of room in hallway for first time today Plan is to go Skilled nursing facility after hospital stay.  Objective: Vital signs in last 24 hours: Temp:  [98.2 F (36.8 C)-98.7 F (37.1 C)] 98.6 F (37 C) (11/27 0520) Pulse Rate:  [73-81] 79 (11/27 0520) Resp:  [16-20] 20 (11/27 0520) BP: (127-156)/(64-77) 156/66 mmHg (11/27 0520) SpO2:  [95 %-99 %] 95 % (11/27 0520)  Intake/Output from previous day:  Intake/Output Summary (Last 24 hours) at 02/21/13 0914 Last data filed at 02/21/13 0522  Gross per 24 hour  Intake    440 ml  Output   2650 ml  Net  -2210 ml    Intake/Output this shift:    Labs:  Recent Labs  02/19/13 0445 02/20/13 0410 02/21/13 0545  HGB 7.8* 7.6* 7.6*    Recent Labs  02/20/13 0410 02/21/13 0545  WBC 12.4* 10.2  RBC 2.60* 2.63*  HCT 23.6* 23.9*  PLT 445* 465*    Recent Labs  02/20/13 0410 02/21/13 0545  NA 130* 129*  K 4.1 4.1  CL 95* 95*  CO2 27 26  BUN 7 6  CREATININE 0.53 0.57  GLUCOSE 95 96  CALCIUM 8.3* 8.1*   No results found for this basename: LABPT, INR,  in the last 72 hours  EXAM General - Patient is Alert, Appropriate and Oriented Extremity - Leg looks much better. Wound finally improving and appears to be healing well Dressing/Incision - clean, dry, no drainage Motor Function - intact, moving foot and toes well on exam.   Past Medical History  Diagnosis Date  . Mixed hyperlipidemia   . Hypothyroidism   . GERD (gastroesophageal reflux disease)   . Atrial fibrillation     Remote amiodarone use, recurred in 07/2011 post op from knee arthroplasty  . Obese   . History of blood transfusion 08/31/2011    A+ type  . History of GI bleed     Gastritis, was on Xarelto and ASA  . Carpal tunnel syndrome   . Arthritis     Knees  . DJD (degenerative joint  disease), thoracolumbar   . Rheumatoid arthritis   . H/O bronchitis   . Hemorrhoids   . Measles     as child  . Mumps     as child  . Carotid artery disease   . PONV (postoperative nausea and vomiting)   . Anemia   . Difficulty sleeping   . Multiple bruises     DUE TO FALL 01/24/13    Assessment/Plan: 6 Days Post-Op Procedure(s) (LRB): IRRIGATION AND DEBRIDIMENT RIGHT KNEE  WITH EXCHANGE OF  ANTIBIOTIC SPACERS (Right) Active Problems:   Necrosis of surgical wound   Protein-calorie malnutrition, severe   Essential hypertension, benign   Hyponatremia   Postoperative anemia due to acute blood loss   Postop Transfusion   Surgical wound infection   Leukocytosis   Hypothyroidism   Acquired absence of knee joint following removal of joint prosthesis with presence of antibiotic-impregnated cement spacer   Group B streptococcal infection   Severe malnutrition  Continue IV antibiotics WBC normal for first time in months!!!! Possible SNF early next week as long as wound continues to improve   Jad Johansson V 02/21/2013, 9:14 AM

## 2013-02-22 LAB — BASIC METABOLIC PANEL
BUN: 7 mg/dL (ref 6–23)
Calcium: 8.4 mg/dL (ref 8.4–10.5)
Chloride: 94 mEq/L — ABNORMAL LOW (ref 96–112)
GFR calc Af Amer: >90 mL/min (ref >90)
GFR calc non Af Amer: 86 mL/min — ABNORMAL LOW (ref >90)
Glucose, Bld: 97 mg/dL (ref 70–99)
Sodium: 129 mEq/L — ABNORMAL LOW (ref 135–145)

## 2013-02-22 LAB — ANAEROBIC CULTURE

## 2013-02-22 LAB — VANCOMYCIN, TROUGH: Vancomycin Tr: 16.6 ug/mL (ref 10.0–20.0)

## 2013-02-22 LAB — CBC
MCH: 29.4 pg (ref 26.0–34.0)
MCHC: 32.5 g/dL (ref 30.0–36.0)
Platelets: 477 10*3/uL — ABNORMAL HIGH (ref 150–400)
RBC: 2.65 MIL/uL — ABNORMAL LOW (ref 3.87–5.11)

## 2013-02-22 MED ORDER — VANCOMYCIN HCL 500 MG IV SOLR
500.0000 mg | Freq: Two times a day (BID) | INTRAVENOUS | Status: DC
  Administered 2013-02-22 – 2013-02-24 (×6): 500 mg via INTRAVENOUS
  Filled 2013-02-22 (×7): qty 500

## 2013-02-22 NOTE — Progress Notes (Signed)
Subjective: 7 Days Post-Op Procedure(s) (LRB): IRRIGATION AND DEBRIDIMENT RIGHT KNEE  WITH EXCHANGE OF  ANTIBIOTIC SPACERS (Right) Patient reports pain as mild.   Patient is beginning to feel better overall. Appetite somewhat improved. Plan is to go Skilled nursing facility after hospital stay.  Objective: Vital signs in last 24 hours: Temp:  [97.4 F (36.3 C)-98.7 F (37.1 C)] 98.7 F (37.1 C) (11/28 0414) Pulse Rate:  [73-81] 73 (11/28 0414) Resp:  [16-20] 16 (11/28 0414) BP: (134-152)/(61-74) 134/61 mmHg (11/28 0414) SpO2:  [97 %-98 %] 98 % (11/28 0414)  Intake/Output from previous day:  Intake/Output Summary (Last 24 hours) at 02/22/13 0830 Last data filed at 02/22/13 0400  Gross per 24 hour  Intake    720 ml  Output   2650 ml  Net  -1930 ml    Intake/Output this shift:    Labs:  Recent Labs  02/20/13 0410 02/21/13 0545 02/22/13 0540  HGB 7.6* 7.6* 7.8*    Recent Labs  02/21/13 0545 02/22/13 0540  WBC 10.2 10.0  RBC 2.63* 2.65*  HCT 23.9* 24.0*  PLT 465* 477*    Recent Labs  02/21/13 0545 02/22/13 0540  NA 129* 129*  K 4.1 4.3  CL 95* 94*  CO2 26 27  BUN 6 7  CREATININE 0.57 0.55  GLUCOSE 96 97  CALCIUM 8.1* 8.4   No results found for this basename: LABPT, INR,  in the last 72 hours  EXAM General - Patient is Alert, Appropriate and Oriented Extremity - Neurologically intact Neurovascular intact wound still with mild bloody drainage without erythema. I debrided the edges of a smal open area and was able to close it with steri-strips Dressing/Incision - see above Motor Function - intact, moving foot and toes well on exam.   Past Medical History  Diagnosis Date  . Mixed hyperlipidemia   . Hypothyroidism   . GERD (gastroesophageal reflux disease)   . Atrial fibrillation     Remote amiodarone use, recurred in 07/2011 post op from knee arthroplasty  . Obese   . History of blood transfusion 08/31/2011    A+ type  . History of GI bleed       Gastritis, was on Xarelto and ASA  . Carpal tunnel syndrome   . Arthritis     Knees  . DJD (degenerative joint disease), thoracolumbar   . Rheumatoid arthritis   . H/O bronchitis   . Hemorrhoids   . Measles     as child  . Mumps     as child  . Carotid artery disease   . PONV (postoperative nausea and vomiting)   . Anemia   . Difficulty sleeping   . Multiple bruises     DUE TO FALL 01/24/13    Assessment/Plan: 7 Days Post-Op Procedure(s) (LRB): IRRIGATION AND DEBRIDIMENT RIGHT KNEE  WITH EXCHANGE OF  ANTIBIOTIC SPACERS (Right) Active Problems:   Necrosis of surgical wound   Protein-calorie malnutrition, severe   Essential hypertension, benign   Hyponatremia   Postoperative anemia due to acute blood loss   Postop Transfusion   Surgical wound infection   Leukocytosis   Hypothyroidism   Acquired absence of knee joint following removal of joint prosthesis with presence of antibiotic-impregnated cement spacer   Group B streptococcal infection   Severe malnutrition   Up with therapy Continue daily dressing change Encouraging that WBC has normalized and continues to trend downward. Possible SNF early next week if wound continues to stabilize/improve    Lillard Bailon  V 02/22/2013, 8:30 AM

## 2013-02-22 NOTE — Progress Notes (Signed)
ANTIBIOTIC CONSULT NOTE - FOLLOW-UP  Pharmacy Consult for vancomycin Indication: prosthetic joint infection  Allergies  Allergen Reactions  . Omeprazole Nausea And Vomiting  . Penicillins Itching  . Morphine Rash    And hot flashes  . Xarelto [Rivaroxaban] Other (See Comments)    Bleeding,spitting up blood    Patient Measurements: Height: 5' 5.35" (166 cm) (Documented in chart 01/25/13) Weight: 149 lb 14.6 oz (68 kg) (Documented in chart 01/25/13) IBW/kg (Calculated) : 57.81   Vital Signs: Temp: 98.7 F (37.1 C) (11/28 0414) Temp src: Oral (11/28 0414) BP: 134/61 mmHg (11/28 0414) Pulse Rate: 73 (11/28 0414) Intake/Output from previous day: 11/27 0701 - 11/28 0700 In: 720 [P.O.:720] Out: 2650 [Urine:2650] Intake/Output from this shift:    Labs:  Recent Labs  02/20/13 0410 02/21/13 0545 02/22/13 0540  WBC 12.4* 10.2 10.0  HGB 7.6* 7.6* 7.8*  PLT 445* 465* 477*  CREATININE 0.53 0.57 0.55   Estimated Creatinine Clearance: 51.2 ml/min (by C-G formula based on Cr of 0.55).  Recent Labs  02/22/13 0640  VANCOTROUGH 16.6     Microbiology: Recent Results (from the past 720 hour(s))  SURGICAL PCR SCREEN     Status: None   Collection Time    01/25/13 10:31 AM      Result Value Range Status   MRSA, PCR NEGATIVE  NEGATIVE Final   Staphylococcus aureus NEGATIVE  NEGATIVE Final   Comment:            The Xpert SA Assay (FDA     approved for NASAL specimens     in patients over 52 years of age),     is one component of     a comprehensive surveillance     program.  Test performance has     been validated by [employer] for patients greater     than or equal to 70 year old.     It is not intended     to diagnose infection nor to     guide or monitor treatment.  WOUND CULTURE     Status: None   Collection Time    01/28/13  6:02 PM      Result Value Range Status   Specimen Description WOUND RIGHT KNEE   Final   Special Requests PATIENT ON FOLLOWING  VANCOMYACIN   Final   Gram Stain     Final   Value: MODERATE WBC PRESENT, PREDOMINANTLY PMN     NO SQUAMOUS EPITHELIAL CELLS SEEN     ABUNDANT GRAM POSITIVE COCCI     IN PAIRS FEW GRAM NEGATIVE RODS     Performed at [facility]   Culture     Final   Value: ABUNDANT GROUP B STREP(S.AGALACTIAE)ISOLATED     Note: TESTING AGAINST S. AGALACTIAE NOT ROUTINELY PERFORMED DUE TO PREDICTABILITY OF AMP/PEN/VAN SUSCEPTIBILITY.     Performed at [facility]   Report Status 01/31/2013 FINAL   Final  ANAEROBIC CULTURE     Status: None   Collection Time    01/28/13  6:02 PM      Result Value Range Status   Specimen Description WOUND RIGHT KNEE   Final   Special Requests PATIENT ON FOLLOWING VANCOMYCIN   Final   Gram Stain     Final   Value: MODERATE WBC PRESENT, PREDOMINANTLY PMN     NO SQUAMOUS EPITHELIAL CELLS SEEN     ABUNDANT GRAM POSITIVE COCCI     IN PAIRS  FEW GRAM NEGATIVE RODS     Performed at [facility]   Culture     Final   Value: BACTEROIDES SPECIES     Note: BETA LACTAMASE POSITIVE     Performed at [facility]   Report Status 02/04/2013 FINAL   Final  CULTURE, BLOOD (ROUTINE X 2)     Status: None   Collection Time    02/05/13  6:15 PM      Result Value Range Status   Specimen Description BLOOD LEFT HAND   Final   Special Requests BOTTLES DRAWN AEROBIC AND ANAEROBIC 10CC   Final   Culture  Setup Time     Final   Value: 02/05/2013 22:14     Performed at [facility]   Culture     Final   Value: NO GROWTH 5 DAYS     Performed at [facility]   Report Status 02/11/2013 FINAL   Final  CULTURE, BLOOD (ROUTINE X 2)     Status: None   Collection Time    02/05/13  6:45 PM      Result Value Range Status   Specimen Description BLOOD LEFT ARM   Final   Special Requests BOTTLES DRAWN AEROBIC AND ANAEROBIC 10CC   Final   Culture  Setup Time     Final   Value: 02/05/2013 22:15     Performed at [facility]   Culture     Final    Value: NO GROWTH 5 DAYS     Performed at [facility]   Report Status 02/11/2013 FINAL   Final  URINE CULTURE     Status: None   Collection Time    02/05/13  8:01 PM      Result Value Range Status   Specimen Description URINE, RANDOM   Final   Special Requests Normal   Final   Culture  Setup Time     Final   Value: 02/06/2013 01:09     Performed at [facility]   Culture     Final   Value: NO GROWTH     Performed at [facility]   Report Status 02/06/2013 FINAL   Final  CLOSTRIDIUM DIFFICILE BY PCR     Status: None   Collection Time    02/08/13 12:00 AM      Result Value Range Status   C difficile by pcr NEGATIVE  NEGATIVE Final   Comment: Performed at [facility]  WOUND CULTURE     Status: None   Collection Time    02/15/13  6:35 PM      Result Value Range Status   Specimen Description KNEE RIGHT   Final   Special Requests ROCEPHIN   Final   Gram Stain     Final   Value: ABUNDANT WBC PRESENT,BOTH PMN AND MONONUCLEAR     NO SQUAMOUS EPITHELIAL CELLS SEEN     ABUNDANT GRAM NEGATIVE RODS     Performed at [facility]   Culture     Final   Value: FEW ENTEROCOCCUS SPECIES     Performed at [facility]   Report Status 02/19/2013 FINAL   Final   Organism ID, Bacteria ENTEROCOCCUS SPECIES   Final  ANAEROBIC CULTURE     Status: None   Collection Time    02/15/13  6:35 PM      Result Value Range Status   Specimen Description KNEE RIGHT   Final  Special Requests ROCEPHIN   Final   Gram Stain     Final   Value: ABUNDANT WBC PRESENT,BOTH PMN AND MONONUCLEAR     NO SQUAMOUS EPITHELIAL CELLS SEEN     ABUNDANT GRAM NEGATIVE RODS     Performed at [facility]   Culture     Final   Value: NO ANAEROBES ISOLATED; CULTURE IN PROGRESS FOR 5 DAYS     Performed at [facility]   Report Status PENDING   Incomplete    Medical History: Past Medical History  Diagnosis Date  . Mixed hyperlipidemia   . Hypothyroidism    . GERD (gastroesophageal reflux disease)   . Atrial fibrillation     Remote amiodarone use, recurred in 07/2011 post op from knee arthroplasty  . Obese   . History of blood transfusion 08/31/2011    A+ type  . History of GI bleed     Gastritis, was on Xarelto and ASA  . Carpal tunnel syndrome   . Arthritis     Knees  . DJD (degenerative joint disease), thoracolumbar   . Rheumatoid arthritis   . H/O bronchitis   . Hemorrhoids   . Measles     as child  . Mumps     as child  . Carotid artery disease   . PONV (postoperative nausea and vomiting)   . Anemia   . Difficulty sleeping   . Multiple bruises     DUE TO FALL 01/24/13   Assessment: 79 yo F with prosthetic joint infection of R TKA, now s/p resection of the arthroplasty with placement of antibiotic spacer.  Culture from 11/3 grew Group B strep and Bacteroides sp .  Clinical course was complicated by wound dehiscence; culture from 11/21 grew enterococcus.   Now on D#4 vancomycin 750 mg IV q12h + metronidazole 500 mg PO TID.  Infectious Diseases service is following.  SCr stable at 0.55.  Vancomycin trough 16.6 today.  While currently therapeutic, am concerned that vancomycin level will accumulate on current dosage given patient's age.    Goal of Therapy:  Eradication of infection Vancomycin trough level 15-20 mcg/ml  Plan:  1.  Reduce vancomycin to 500 mg IV q12h. 2.  Recheck vancomycin trough on Monday 12/1 at 0700.  Elie Goody, PharmD, BCPS Pager: [phone] 02/22/2013  8:02 AM

## 2013-02-22 NOTE — Progress Notes (Signed)
CSW following for discharge planning when medically ready. CSW was working on securing a bed for patient at [facility] - will follow-up Monday.    Unice Bailey, LCSW [facility] Clinical Social Worker cell #: [phone]

## 2013-02-23 LAB — BASIC METABOLIC PANEL
BUN: 7 mg/dL (ref 6–23)
CO2: 28 mEq/L (ref 19–32)
Calcium: 8.7 mg/dL (ref 8.4–10.5)
GFR calc Af Amer: >90 mL/min (ref >90)
GFR calc non Af Amer: 86 mL/min — ABNORMAL LOW (ref >90)
Glucose, Bld: 102 mg/dL — ABNORMAL HIGH (ref 70–99)
Potassium: 4.2 mEq/L (ref 3.5–5.1)

## 2013-02-23 LAB — CBC
HCT: 24.8 % — ABNORMAL LOW (ref 36.0–46.0)
Hemoglobin: 7.8 g/dL — ABNORMAL LOW (ref 12.0–15.0)
MCH: 29 pg (ref 26.0–34.0)
MCHC: 31.5 g/dL (ref 30.0–36.0)
MCV: 92.2 fL (ref 78.0–100.0)
Platelets: 560 10*3/uL — ABNORMAL HIGH (ref 150–400)
RBC: 2.69 MIL/uL — ABNORMAL LOW (ref 3.87–5.11)
RDW: 17 % — ABNORMAL HIGH (ref 11.5–15.5)
WBC: 10.5 10*3/uL (ref 4.0–10.5)

## 2013-02-23 NOTE — Progress Notes (Signed)
   Subjective: 8 Days Post-Op Procedure(s) (LRB): IRRIGATION AND DEBRIDIMENT RIGHT KNEE  WITH EXCHANGE OF  ANTIBIOTIC SPACERS (Right)  Pt sitting up comfortably in good spirits this am Minimal pain to right knee today Patient reports pain as mild.  Objective:   VITALS:   Filed Vitals:   02/23/13 0530  BP: 139/73  Pulse: 76  Temp: 98.1 F (36.7 C)  Resp: 18    Right knee with open wound just distal over patella Mild purulent drainage nv intact distally Able to lift leg without pain with help Dressing change today  LABS  Recent Labs  02/21/13 0545 02/22/13 0540 02/23/13 0413  HGB 7.6* 7.8* 7.8*  HCT 23.9* 24.0* 24.8*  WBC 10.2 10.0 10.5  PLT 465* 477* 560*     Recent Labs  02/21/13 0545 02/22/13 0540 02/23/13 0413  NA 129* 129* 132*  K 4.1 4.3 4.2  BUN 6 7 7   CREATININE 0.57 0.55 0.56  GLUCOSE 96 97 102*     Assessment/Plan: 8 Days Post-Op Procedure(s) (LRB): IRRIGATION AND DEBRIDIMENT RIGHT KNEE  WITH EXCHANGE OF  ANTIBIOTIC SPACERS (Right) Continue with daily dressing changes Continue IV antibiotics    Brad Rhandi Despain, MPAS, PA-C  02/23/2013, 8:07 AM

## 2013-02-24 DIAGNOSIS — T8140XA Infection following a procedure, unspecified, initial encounter: Secondary | ICD-10-CM

## 2013-02-24 DIAGNOSIS — M159 Polyosteoarthritis, unspecified: Secondary | ICD-10-CM

## 2013-02-24 DIAGNOSIS — M069 Rheumatoid arthritis, unspecified: Secondary | ICD-10-CM

## 2013-02-24 DIAGNOSIS — T8131XA Disruption of external operation (surgical) wound, not elsewhere classified, initial encounter: Secondary | ICD-10-CM

## 2013-02-24 LAB — CBC
Hemoglobin: 7.8 g/dL — ABNORMAL LOW (ref 12.0–15.0)
MCH: 29.4 pg (ref 26.0–34.0)
Platelets: 520 10*3/uL — ABNORMAL HIGH (ref 150–400)
RBC: 2.65 MIL/uL — ABNORMAL LOW (ref 3.87–5.11)
RDW: 17.4 % — ABNORMAL HIGH (ref 11.5–15.5)
WBC: 10.6 10*3/uL — ABNORMAL HIGH (ref 4.0–10.5)

## 2013-02-24 LAB — BASIC METABOLIC PANEL
Calcium: 8.5 mg/dL (ref 8.4–10.5)
GFR calc Af Amer: >90 mL/min (ref >90)
GFR calc non Af Amer: 85 mL/min — ABNORMAL LOW (ref >90)
Potassium: 4 mEq/L (ref 3.5–5.1)
Sodium: 133 mEq/L — ABNORMAL LOW (ref 135–145)

## 2013-02-24 NOTE — Progress Notes (Signed)
   Subjective: 9 Days Post-Op Procedure(s) (LRB): IRRIGATION AND DEBRIDIMENT RIGHT KNEE  WITH EXCHANGE OF  ANTIBIOTIC SPACERS (Right)  Pt c/o mild right hip pain as well as right knee pain this morning that is mild Hip and knee also have a spasm Otherwise no changes Patient reports pain as mild.  Objective:   VITALS:   Filed Vitals:   02/24/13 0520  BP: 135/66  Pulse: 78  Temp: 98.3 F (36.8 C)  Resp: 20    Right knee with minimal purulent drainage  nv intact distally No rashes or edema  LABS  Recent Labs  02/22/13 0540 02/23/13 0413 02/24/13 0430  HGB 7.8* 7.8* 7.8*  HCT 24.0* 24.8* 24.5*  WBC 10.0 10.5 10.6*  PLT 477* 560* 520*     Recent Labs  02/22/13 0540 02/23/13 0413 02/24/13 0430  NA 129* 132* 133*  K 4.3 4.2 4.0  BUN 7 7 7   CREATININE 0.55 0.56 0.58  GLUCOSE 97 102* 88     Assessment/Plan: 9 Days Post-Op Procedure(s) (LRB): IRRIGATION AND DEBRIDIMENT RIGHT KNEE  WITH EXCHANGE OF  ANTIBIOTIC SPACERS (Right)  Continue with current treatment Supportive care as needed   Alphonsa Overall, MPAS, PA-C  02/24/2013, 7:50 AM

## 2013-02-24 NOTE — Progress Notes (Signed)
    [facility]    Date of Admission:  01/28/2013           Day 6 vanco               Day 19 ceftriaxone- d/c'd on 11/25        Day 3 metronidazole- d/c'd                 ID: [name] [name] is a 79 y.o. 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 Had bedside debridement by dr. Lequita Halt today  Medications:  . ALPRAZolam  0.5 mg Oral Daily   And  . ALPRAZolam  1 mg Oral QHS  . docusate sodium  100 mg Oral BID  . esomeprazole  40 mg Oral Daily  . feeding supplement (ENSURE COMPLETE)  237 mL Oral TID WC  . ferrous sulfate  325 mg Oral BID  . fesoterodine  8 mg Oral Daily  . gabapentin  300 mg Oral BID  . levothyroxine  150 mcg Oral QAC breakfast  . multivitamin with minerals  1 tablet Oral Daily  . simvastatin  20 mg Oral q morning - 10a  . vancomycin  500 mg Intravenous Q12H    Objective: Vital signs in last 24 hours: Temp:  [98 F (36.7 C)-98.3 F (36.8 C)] 98 F (36.7 C) (11/30 1400) Pulse Rate:  [78-82] 82 (11/30 1400) Resp:  [18-20] 18 (11/30 1400) BP: (119-135)/(54-66) 119/54 mmHg (11/30 1400) SpO2:  [96 %] 96 % (11/30 1400)  Not examined  Lab Results  Recent Labs  02/23/13 0413 02/24/13 0430  WBC 10.5 10.6*  HGB 7.8* 7.8*  HCT 24.8* 24.5*  NA 132* 133*  K 4.2 4.0  CL 98  96  CO2 28 26  BUN 7 7  CREATININE 0.56 0.58    Microbiology: 11/21 enterococcus amp S 11/3 wound cx group b strep Studies/Results: No results found.   Assessment/Plan: 79 yo F with prosthetic joint infection with group b strep and enterococcus will need prolonged antibiotics x 6 wks Due to penicillin allergy unable to give ampicillin.  - continue with vancomycin with goal trough of 15-20. Thus far, no impact on cr clearance - will treat for total of 6 wks, using 11/25 as start date.  - if she goes to OR for further debridement, please repeat cultures.  - will sign off and have her follow up in 2-4 wks.  Drue Second [facility] for Infectious Diseases Cell: [phone] Pager: [phone]  02/24/2013, 5:12 PM

## 2013-02-24 NOTE — Progress Notes (Signed)
Physical Therapy Treatment Patient Details Name: STELLA BORTLE MRN: [id number] DOB: [birth year] Today's Date: 02/24/2013 Time: 4098-1191 PT Time Calculation (min): 25 min  PT Assessment / Plan / Recommendation  History of Present Illness 79 yo female s/p R knee I&D, resection, antibiotic spacer placed. Recent hx of fall (11/1) resulting in multiple cervical fractures requires soft collar at all times.    PT Comments   Assisted pt OOB to [facility] for a BM then attempted amb however pt unable to comply with TTWB so assisted to recliner.  Increased time to position to comfort.     Follow Up Recommendations  SNF     Does the patient have the potential to tolerate intense rehabilitation     Barriers to Discharge        Equipment Recommendations  Rolling walker with 5" wheels;Wheelchair (measurements PT)    Recommendations for Other Services    Frequency Min 3X/week   Progress towards PT Goals Progress towards PT goals: Progressing toward goals  Plan      Precautions / Restrictions Precautions Precautions: Fall;Cervical Precaution Comments: soft collar, KI AAT NO ROM Required Braces or Orthoses: Cervical Brace;Knee Immobilizer - Right Knee Immobilizer - Right: On at all times Cervical Brace: At all times;Soft collar Restrictions Weight Bearing Restrictions: Yes RLE Weight Bearing: Touchdown weight bearing Other Position/Activity Restrictions: Pt instructed on TTWB however demonstarted difficulty maintaining such restrictiion    Pertinent Vitals/Pain C/o 8/10 pain with activity     Mobility  Bed Mobility Bed Mobility: Supine to Sit Supine to Sit: 3: Mod assist Details for Bed Mobility Assistance: assist bil LEs and slight assist for trunk and increased time Transfers Transfers: Sit to Stand;Stand to Sit Sit to Stand: 2: Max assist;From bed;From toilet;Other (comment);3: Mod assist Stand to Sit: To chair/3-in-1;To toilet;2: Max assist;Other (comment) Details for Transfer  Assistance: verbal cues for LE and UE placement including keeping R LE forward and TDWB only as well as reaching for armrests with stand pivots Ambulation/Gait Ambulation/Gait Assistance: 1: +2 Total assist Ambulation/Gait: Patient Percentage: 70% Ambulation Distance (Feet): 2 Feet Assistive device: Rolling walker Ambulation/Gait Assistance Details: limited amb distance due to inability to maintain TTWB despite MAX VC's and instruction. Gait Pattern: Step-to pattern;Decreased stride length;Trunk flexed;Antalgic;Decreased weight shift to right;Decreased step length - right Gait velocity: decreased     PT Goals (current goals can now be found in the care plan section)    Visit Information  Last PT Received On: 02/24/13 Assistance Needed: +2 History of Present Illness: 79 yo female s/p R knee I&D, resection, antibiotic spacer placed. Recent hx of fall (11/1) resulting in multiple cervical fractures requires soft collar at all times.     Subjective Data      Cognition       Balance     End of Session PT - End of Session Equipment Utilized During Treatment: Gait belt Activity Tolerance: Patient limited by fatigue;Patient limited by pain Patient left: in chair;with call bell/phone within reach;with family/visitor present   Felecia Shelling  PTA [facility]  Acute  Rehab Pager      [phone]

## 2013-02-25 ENCOUNTER — Encounter (HOSPITAL_COMMUNITY): Payer: Self-pay | Admitting: Registered Nurse

## 2013-02-25 ENCOUNTER — Inpatient Hospital Stay (HOSPITAL_COMMUNITY): Payer: Medicare Other | Admitting: Anesthesiology

## 2013-02-25 ENCOUNTER — Encounter (HOSPITAL_COMMUNITY)
Admission: RE | Disposition: A | Discharge status: Skilled Nursing Facility | Payer: Self-pay | Source: Ambulatory Visit | Attending: Orthopedic Surgery

## 2013-02-25 ENCOUNTER — Encounter (HOSPITAL_COMMUNITY): Payer: Medicare Other | Admitting: Anesthesiology

## 2013-02-25 HISTORY — PX: EXCISIONAL TOTAL KNEE ARTHROPLASTY WITH ANTIBIOTIC SPACERS: SHX5827

## 2013-02-25 LAB — BASIC METABOLIC PANEL WITH GFR
BUN: 6 mg/dL (ref 6–23)
CO2: 25 meq/L (ref 19–32)
Calcium: 8.4 mg/dL (ref 8.4–10.5)
Chloride: 96 meq/L (ref 96–112)
Creatinine, Ser: 0.58 mg/dL (ref 0.50–1.10)
GFR calc Af Amer: >90 mL/min
GFR calc non Af Amer: 85 mL/min — ABNORMAL LOW
Glucose, Bld: 127 mg/dL — ABNORMAL HIGH (ref 70–99)
Potassium: 3.5 meq/L (ref 3.5–5.1)
Sodium: 132 meq/L — ABNORMAL LOW (ref 135–145)

## 2013-02-25 LAB — CBC
Hemoglobin: 7.6 g/dL — ABNORMAL LOW (ref 12.0–15.0)
MCV: 91.5 fL (ref 78.0–100.0)
Platelets: 511 10*3/uL — ABNORMAL HIGH (ref 150–400)
RBC: 2.58 MIL/uL — ABNORMAL LOW (ref 3.87–5.11)
WBC: 10.3 10*3/uL (ref 4.0–10.5)

## 2013-02-25 LAB — VANCOMYCIN, TROUGH: Vancomycin Tr: 12 ug/mL (ref 10.0–20.0)

## 2013-02-25 SURGERY — REMOVAL, TOTAL ARTHROPLASTY HARDWARE, KNEE, WITH ANTIBIOTIC SPACER INSERTION
Anesthesia: General | Site: Knee | Laterality: Right | Wound class: Dirty or Infected

## 2013-02-25 MED ORDER — ONDANSETRON HCL 4 MG/2ML IJ SOLN
INTRAMUSCULAR | Status: AC
  Filled 2013-02-25: qty 2

## 2013-02-25 MED ORDER — LACTATED RINGERS IV SOLN
INTRAVENOUS | Status: DC
  Administered 2013-02-25 (×2): 1000 mL via INTRAVENOUS

## 2013-02-25 MED ORDER — LIDOCAINE HCL (CARDIAC) 20 MG/ML IV SOLN
INTRAVENOUS | Status: AC
  Filled 2013-02-25: qty 5

## 2013-02-25 MED ORDER — ONDANSETRON HCL 4 MG/2ML IJ SOLN
INTRAMUSCULAR | Status: DC | PRN
  Administered 2013-02-25: 4 mg via INTRAVENOUS

## 2013-02-25 MED ORDER — LACTATED RINGERS IV SOLN
INTRAVENOUS | Status: DC | PRN
  Administered 2013-02-25: 19:00:00 via INTRAVENOUS

## 2013-02-25 MED ORDER — PROPOFOL 10 MG/ML IV BOLUS
INTRAVENOUS | Status: AC
  Filled 2013-02-25: qty 20

## 2013-02-25 MED ORDER — PROMETHAZINE HCL 25 MG/ML IJ SOLN: 6.2500 mg | INTRAMUSCULAR | Status: DC | PRN

## 2013-02-25 MED ORDER — FENTANYL CITRATE 0.05 MG/ML IJ SOLN
25.0000 ug | INTRAMUSCULAR | Status: DC | PRN
  Administered 2013-02-25 (×2): 50 ug via INTRAVENOUS

## 2013-02-25 MED ORDER — SODIUM CHLORIDE 0.9 % IR SOLN
Status: DC | PRN
  Administered 2013-02-25: 1000 mL

## 2013-02-25 MED ORDER — SODIUM CHLORIDE 0.9 % IV SOLN
INTRAVENOUS | Status: DC
  Administered 2013-02-25: 09:00:00 via INTRAVENOUS

## 2013-02-25 MED ORDER — FENTANYL CITRATE 0.05 MG/ML IJ SOLN
INTRAMUSCULAR | Status: AC
  Filled 2013-02-25: qty 2

## 2013-02-25 MED ORDER — VANCOMYCIN HCL IN DEXTROSE 750-5 MG/150ML-% IV SOLN
750.0000 mg | Freq: Once | INTRAVENOUS | Status: DC
  Filled 2013-02-25: qty 150

## 2013-02-25 MED ORDER — POLYETHYLENE GLYCOL 3350 17 G PO PACK: 17.0000 g | PACK | Freq: Every day | ORAL | Status: DC | PRN

## 2013-02-25 MED ORDER — FENTANYL CITRATE 0.05 MG/ML IJ SOLN
INTRAMUSCULAR | Status: DC | PRN
  Administered 2013-02-25: 25 ug via INTRAVENOUS
  Administered 2013-02-25: 50 ug via INTRAVENOUS
  Administered 2013-02-25: 25 ug via INTRAVENOUS
  Administered 2013-02-25 (×2): 50 ug via INTRAVENOUS

## 2013-02-25 MED ORDER — HYDROMORPHONE HCL PF 1 MG/ML IJ SOLN
INTRAMUSCULAR | Status: AC
  Filled 2013-02-25: qty 1

## 2013-02-25 MED ORDER — BISACODYL 10 MG RE SUPP: 10.0000 mg | Freq: Every day | RECTAL | Status: DC | PRN

## 2013-02-25 MED ORDER — SODIUM CHLORIDE 0.9 % IJ SOLN
INTRAMUSCULAR | Status: AC
  Filled 2013-02-25: qty 50

## 2013-02-25 MED ORDER — MIDAZOLAM HCL 2 MG/2ML IJ SOLN
INTRAMUSCULAR | Status: AC
  Filled 2013-02-25: qty 2

## 2013-02-25 MED ORDER — PROPOFOL 10 MG/ML IV BOLUS
INTRAVENOUS | Status: DC | PRN
  Administered 2013-02-25: 150 mg via INTRAVENOUS

## 2013-02-25 MED ORDER — VANCOMYCIN HCL 1000 MG IV SOLR
INTRAVENOUS | Status: AC
  Filled 2013-02-25: qty 1000

## 2013-02-25 MED ORDER — VANCOMYCIN HCL IN DEXTROSE 750-5 MG/150ML-% IV SOLN
750.0000 mg | Freq: Two times a day (BID) | INTRAVENOUS | Status: DC
  Administered 2013-02-25 – 2013-03-05 (×17): 750 mg via INTRAVENOUS
  Filled 2013-02-25 (×18): qty 150

## 2013-02-25 MED ORDER — BUPIVACAINE HCL (PF) 0.25 % IJ SOLN
INTRAMUSCULAR | Status: AC
  Filled 2013-02-25: qty 30

## 2013-02-25 MED ORDER — SODIUM CHLORIDE 0.9 % IV SOLN
INTRAVENOUS | Status: DC
  Administered 2013-02-25: 22:00:00 via INTRAVENOUS

## 2013-02-25 MED ORDER — HYDROMORPHONE HCL PF 1 MG/ML IJ SOLN
0.2500 mg | INTRAMUSCULAR | Status: DC | PRN
  Administered 2013-02-25 (×4): 0.5 mg via INTRAVENOUS

## 2013-02-25 MED ORDER — VANCOMYCIN HCL 1000 MG IV SOLR
INTRAVENOUS | Status: DC | PRN
  Administered 2013-02-25: 1000 mg

## 2013-02-25 MED ORDER — BUPIVACAINE LIPOSOME 1.3 % IJ SUSP
20.0000 mL | Freq: Once | INTRAMUSCULAR | Status: DC
  Filled 2013-02-25: qty 20

## 2013-02-25 MED ORDER — LIDOCAINE HCL (PF) 2 % IJ SOLN
INTRAMUSCULAR | Status: DC | PRN
  Administered 2013-02-25: 75 mg via INTRADERMAL

## 2013-02-25 MED ORDER — FLEET ENEMA 7-19 GM/118ML RE ENEM: 1.0000 | ENEMA | Freq: Once | RECTAL | Status: AC | PRN

## 2013-02-25 SURGICAL SUPPLY — 57 items
BAG SPEC THK2 15X12 ZIP CLS (MISCELLANEOUS) ×1
BAG ZIPLOCK 12X15 (MISCELLANEOUS) ×2 IMPLANT
BANDAGE ELASTIC 6 VELCRO ST LF (GAUZE/BANDAGES/DRESSINGS) ×2 IMPLANT
BANDAGE ESMARK 6X9 LF (GAUZE/BANDAGES/DRESSINGS) ×1 IMPLANT
BLADE SAG 18X100X1.27 (BLADE) ×1 IMPLANT
BLADE SAW SGTL 11.0X1.19X90.0M (BLADE) ×2 IMPLANT
BNDG CMPR 9X6 STRL LF SNTH (GAUZE/BANDAGES/DRESSINGS) ×1
BNDG ESMARK 6X9 LF (GAUZE/BANDAGES/DRESSINGS) ×2
CEMENT BONE DEPUY (Cement) ×1 IMPLANT
CONT SPECI 4OZ STER CLIK (MISCELLANEOUS) ×1 IMPLANT
CUFF TOURN SGL QUICK 34 (TOURNIQUET CUFF) ×2
CUFF TRNQT CYL 34X4X40X1 (TOURNIQUET CUFF) ×2 IMPLANT
DRAPE EXTREMITY T 121X128X90 (DRAPE) ×2 IMPLANT
DRAPE POUCH INSTRU U-SHP 10X18 (DRAPES) ×2 IMPLANT
DRAPE U-SHAPE 47X51 STRL (DRAPES) ×2 IMPLANT
DRSG ADAPTIC 3X8 NADH LF (GAUZE/BANDAGES/DRESSINGS) ×2 IMPLANT
DRSG PAD ABDOMINAL 8X10 ST (GAUZE/BANDAGES/DRESSINGS) ×1 IMPLANT
DURAPREP 26ML APPLICATOR (WOUND CARE) ×3 IMPLANT
ELECT REM PT RETURN 9FT ADLT (ELECTROSURGICAL) ×2
ELECTRODE REM PT RTRN 9FT ADLT (ELECTROSURGICAL) ×1 IMPLANT
EVACUATOR 1/8 PVC DRAIN (DRAIN) ×2 IMPLANT
FACESHIELD LNG OPTICON STERILE (SAFETY) ×7 IMPLANT
GLOVE BIO SURGEON STRL SZ7.5 (GLOVE) ×2 IMPLANT
GLOVE BIO SURGEON STRL SZ8 (GLOVE) ×2 IMPLANT
GLOVE BIOGEL PI IND STRL 8 (GLOVE) ×1 IMPLANT
GLOVE BIOGEL PI INDICATOR 8 (GLOVE) ×1
GOWN PREVENTION PLUS LG XLONG (DISPOSABLE) ×2 IMPLANT
GOWN STRL REIN XL XLG (GOWN DISPOSABLE) ×3 IMPLANT
HANDPIECE INTERPULSE COAX TIP (DISPOSABLE) ×2
IMMOBILIZER KNEE 20 (SOFTGOODS) ×2
IMMOBILIZER KNEE 20 THIGH 36 (SOFTGOODS) IMPLANT
IV NS 1000ML (IV SOLUTION) ×2
IV NS 1000ML BAXH (IV SOLUTION) IMPLANT
KIT BASIN OR (CUSTOM PROCEDURE TRAY) ×2 IMPLANT
MANIFOLD NEPTUNE II (INSTRUMENTS) ×2 IMPLANT
NS IRRIG 1000ML POUR BTL (IV SOLUTION) ×3 IMPLANT
PACK TOTAL JOINT (CUSTOM PROCEDURE TRAY) ×2 IMPLANT
PAD ABD 7.5X8 STRL (GAUZE/BANDAGES/DRESSINGS) ×2 IMPLANT
PADDING CAST COTTON 6X4 STRL (CAST SUPPLIES) ×2 IMPLANT
POSITIONER SURGICAL ARM (MISCELLANEOUS) ×2 IMPLANT
SET HNDPC FAN SPRY TIP SCT (DISPOSABLE) ×1 IMPLANT
SPONGE GAUZE 4X4 12PLY (GAUZE/BANDAGES/DRESSINGS) ×2 IMPLANT
STAPLER VISISTAT 35W (STAPLE) ×1 IMPLANT
SUCTION FRAZIER TIP 10 FR DISP (SUCTIONS) ×2 IMPLANT
SUT ETHILON 4 0 PS 2 18 (SUTURE) ×3 IMPLANT
SUT PDS AB 1 CT1 27 (SUTURE) ×3 IMPLANT
SUT VIC AB 1 CT1 27 (SUTURE)
SUT VIC AB 1 CT1 27XBRD ANTBC (SUTURE) ×3 IMPLANT
SUT VIC AB 2-0 CT1 27 (SUTURE) ×6
SUT VIC AB 2-0 CT1 TAPERPNT 27 (SUTURE) ×3 IMPLANT
SWAB COLLECTION DEVICE MRSA (MISCELLANEOUS) ×1 IMPLANT
TOWEL OR 17X26 10 PK STRL BLUE (TOWEL DISPOSABLE) ×5 IMPLANT
TOWER CARTRIDGE SMART MIX (DISPOSABLE) ×2 IMPLANT
TRAY FOLEY CATH 14FRSI W/METER (CATHETERS) ×2 IMPLANT
TUBE ANAEROBIC SPECIMEN COL (MISCELLANEOUS) ×1 IMPLANT
WATER STERILE IRR 1500ML POUR (IV SOLUTION) ×1 IMPLANT
WRAP KNEE MAXI GEL POST OP (GAUZE/BANDAGES/DRESSINGS) ×3 IMPLANT

## 2013-02-25 NOTE — Progress Notes (Signed)
Physical Therapy Treatment Patient Details Name: [name] [name] MRN: [id number] DOB: 1930/10/04 Today's Date: 02/25/2013 Time: 8295-6213 PT Time Calculation (min): 18 min  PT Assessment / Plan / Recommendation  History of Present Illness 79 yo female s/p R knee I&D, resection, antibiotic spacer placed. Recent hx of fall (11/1) resulting in multiple cervical fractures requires soft collar at all times.    PT Comments   Assisted pt with bed mobility side to side rolling for hygiene then OOB to recliner.  Pt required increased time due to pain and increased time to position in recliner to comfort.  Pt scheduled for surgery later today to remove spacer/I&D as her incision has reopened per MD note.   Follow Up Recommendations  SNF     Does the patient have the potential to tolerate intense rehabilitation     Barriers to Discharge        Equipment Recommendations       Recommendations for Other Services    Frequency Min 3X/week   Progress towards PT Goals Progress towards PT goals: Progressing toward goals  Plan      Precautions / Restrictions Precautions Precautions: Fall;Cervical Precaution Comments: soft collar, KI AAT NO ROM Required Braces or Orthoses: Cervical Brace;Knee Immobilizer - Right Knee Immobilizer - Right: On at all times Cervical Brace: At all times;Soft collar Restrictions Weight Bearing Restrictions: Yes RLE Weight Bearing: Touchdown weight bearing Other Position/Activity Restrictions: Pt instructed on TTWB however demonstarted difficulty maintaining such restrictiion    Pertinent Vitals/Pain C/o 8/10 pain meds requested    Mobility  Bed Mobility Bed Mobility: Supine to Sit Supine to Sit: 3: Mod assist;4: Min assist Details for Bed Mobility Assistance: assist bil LEs and slight assist for trunk and increased time Transfers Transfers: Sit to Stand;Stand to Sit Sit to Stand: 2: Max assist;From bed;3: Mod assist Stand to Sit: To chair/3-in-1;2: Max  assist;3: Mod assist Details for Transfer Assistance: verbal cues for LE and UE placement including keeping R LE forward and TDWB only as well as reaching for armrests with stand pivots Ambulation/Gait Ambulation/Gait Assistance Details: Transfers only from bed to recliner as pt's incision has re opened and scheduled for surgery later today     PT Goals (current goals can now be found in the care plan section)    Visit Information  Last PT Received On: 02/25/13 Assistance Needed: +1 History of Present Illness: 79 yo female s/p R knee I&D, resection, antibiotic spacer placed. Recent hx of fall (11/1) resulting in multiple cervical fractures requires soft collar at all times.     Subjective Data      Cognition       Balance     End of Session PT - End of Session Equipment Utilized During Treatment: Gait belt Activity Tolerance: Patient limited by pain (pain meds requested) Patient left: in chair;with call bell/phone within reach;with family/visitor present Nurse Communication: Mobility status   Felecia Shelling  PTA [facility]  Acute  Rehab Pager      [phone]

## 2013-02-25 NOTE — Transfer of Care (Signed)
Immediate Anesthesia Transfer of Care Note  Patient: [name] [name]  Procedure(s) Performed: Procedure(s): IRRIGATION AND DEBRIDIMENT  RIGHT KNEE AND REMOVAL AND REPLACEMENT OF ANTIBIOTIC SPACERS RIGHT KNEE (Right)  Patient Location: PACU  Anesthesia Type:General  Level of Consciousness: awake, alert  and oriented  Airway & Oxygen Therapy: Patient Spontanous Breathing and Patient connected to face mask oxygen  Post-op Assessment: Report given to PACU RN and Post -op Vital signs reviewed and stable  Post vital signs: Reviewed and stable  Complications: No apparent anesthesia complications

## 2013-02-25 NOTE — Addendum Note (Signed)
Addendum created 02/25/13 1057 by Eilene Ghazi, MD   Modules edited: Anesthesia Attestations

## 2013-02-25 NOTE — Anesthesia Preprocedure Evaluation (Signed)
Anesthesia Evaluation  Patient identified by MRN, date of birth, ID band Patient awake    Reviewed: Allergy & Precautions, H&P , NPO status , Patient's Chart, lab work & pertinent test results  History of Anesthesia Complications (+) PONV  Airway Mallampati: II TM Distance: >3 FB Neck ROM: Limited   Comment: Has soft cervical collar Dental no notable dental hx.    Pulmonary neg pulmonary ROS,  breath sounds clear to auscultation  Pulmonary exam normal       Cardiovascular hypertension, Rhythm:Regular Rate:Normal     Neuro/Psych negative neurological ROS  negative psych ROS   GI/Hepatic negative GI ROS, Neg liver ROS,   Endo/Other  Hypothyroidism   Renal/GU negative Renal ROS  negative genitourinary   Musculoskeletal  (+) Arthritis -, Rheumatoid disorders,    Abdominal   Peds negative pediatric ROS (+)  Hematology negative hematology ROS (+) anemia ,   Anesthesia Other Findings   Reproductive/Obstetrics negative OB ROS                           Anesthesia Physical Anesthesia Plan  ASA: III  Anesthesia Plan: General   Post-op Pain Management:    Induction: Intravenous  Airway Management Planned: LMA  Additional Equipment:   Intra-op Plan:   Post-operative Plan:   Informed Consent: I have reviewed the patients History and Physical, chart, labs and discussed the procedure including the risks, benefits and alternatives for the proposed anesthesia with the patient or authorized representative who has indicated his/her understanding and acceptance.   Dental advisory given  Plan Discussed with: CRNA and Surgeon  Anesthesia Plan Comments:         Anesthesia Quick Evaluation

## 2013-02-25 NOTE — Progress Notes (Signed)
ANTIBIOTIC CONSULT NOTE - FOLLOW UP  Pharmacy Consult for Vancomycin Indication: prosthetic joint infection  Allergies  Allergen Reactions  . Omeprazole Nausea And Vomiting  . Penicillins Itching  . Morphine Rash    And hot flashes  . Xarelto [Rivaroxaban] Other (See Comments)    Bleeding,spitting up blood    Patient Measurements: Height: 5' 5.35" (166 cm) (Documented in chart 01/25/13) Weight: 149 lb 14.6 oz (68 kg) (Documented in chart 01/25/13) IBW/kg (Calculated) : 57.81 Adjusted Body Weight:   Vital Signs: Temp: 98.7 F (37.1 C) (12/01 0500) Temp src: Oral (12/01 0500) BP: 129/63 mmHg (12/01 0500) Pulse Rate: 77 (12/01 0500) Intake/Output from previous day: 11/30 0701 - 12/01 0700 In: 2184 [P.O.:1560; I.V.:424; IV Piggyback:200] Out: 2700 [Urine:2700] Intake/Output from this shift: Total I/O In: 599.7 [P.O.:240; I.V.:259.7; IV Piggyback:100] Out: 1350 [Urine:1350]  Labs:  Recent Labs  02/23/13 0413 02/24/13 0430 02/25/13 0500  WBC 10.5 10.6* 10.3  HGB 7.8* 7.8* 7.6*  PLT 560* 520* 511*  CREATININE 0.56 0.58 0.58   Estimated Creatinine Clearance: 51.2 ml/min (by C-G formula based on Cr of 0.58).  Recent Labs  02/22/13 0640 02/25/13 0500  VANCOTROUGH 16.6 12.0     Microbiology: Recent Results (from the past 720 hour(s))  WOUND CULTURE     Status: None   Collection Time    01/28/13  6:02 PM      Result Value Range Status   Specimen Description WOUND RIGHT KNEE   Final   Special Requests PATIENT ON FOLLOWING VANCOMYACIN   Final   Gram Stain     Final   Value: MODERATE WBC PRESENT, PREDOMINANTLY PMN     NO SQUAMOUS EPITHELIAL CELLS SEEN     ABUNDANT GRAM POSITIVE COCCI     IN PAIRS FEW GRAM NEGATIVE RODS     Performed at [facility]   Culture     Final   Value: ABUNDANT GROUP B STREP(S.AGALACTIAE)ISOLATED     Note: TESTING AGAINST S. AGALACTIAE NOT ROUTINELY PERFORMED DUE TO PREDICTABILITY OF AMP/PEN/VAN SUSCEPTIBILITY.     Performed  at [facility]   Report Status 01/31/2013 FINAL   Final  ANAEROBIC CULTURE     Status: None   Collection Time    01/28/13  6:02 PM      Result Value Range Status   Specimen Description WOUND RIGHT KNEE   Final   Special Requests PATIENT ON FOLLOWING VANCOMYCIN   Final   Gram Stain     Final   Value: MODERATE WBC PRESENT, PREDOMINANTLY PMN     NO SQUAMOUS EPITHELIAL CELLS SEEN     ABUNDANT GRAM POSITIVE COCCI     IN PAIRS FEW GRAM NEGATIVE RODS     Performed at [facility]   Culture     Final   Value: BACTEROIDES SPECIES     Note: BETA LACTAMASE POSITIVE     Performed at [facility]   Report Status 02/04/2013 FINAL   Final  CULTURE, BLOOD (ROUTINE X 2)     Status: None   Collection Time    02/05/13  6:15 PM      Result Value Range Status   Specimen Description BLOOD LEFT HAND   Final   Special Requests BOTTLES DRAWN AEROBIC AND ANAEROBIC 10CC   Final   Culture  Setup Time     Final   Value: 02/05/2013 22:14     Performed at [facility]   Culture     Final  Value: NO GROWTH 5 DAYS     Performed at [facility]   Report Status 02/11/2013 FINAL   Final  CULTURE, BLOOD (ROUTINE X 2)     Status: None   Collection Time    02/05/13  6:45 PM      Result Value Range Status   Specimen Description BLOOD LEFT ARM   Final   Special Requests BOTTLES DRAWN AEROBIC AND ANAEROBIC 10CC   Final   Culture  Setup Time     Final   Value: 02/05/2013 22:15     Performed at [facility]   Culture     Final   Value: NO GROWTH 5 DAYS     Performed at [facility]   Report Status 02/11/2013 FINAL   Final  URINE CULTURE     Status: None   Collection Time    02/05/13  8:01 PM      Result Value Range Status   Specimen Description URINE, RANDOM   Final   Special Requests Normal   Final   Culture  Setup Time     Final   Value: 02/06/2013 01:09     Performed at [facility]   Culture     Final   Value: NO GROWTH     Performed  at [facility]   Report Status 02/06/2013 FINAL   Final  CLOSTRIDIUM DIFFICILE BY PCR     Status: None   Collection Time    02/08/13 12:00 AM      Result Value Range Status   C difficile by pcr NEGATIVE  NEGATIVE Final   Comment: Performed at [facility]  WOUND CULTURE     Status: None   Collection Time    02/15/13  6:35 PM      Result Value Range Status   Specimen Description KNEE RIGHT   Final   Special Requests ROCEPHIN   Final   Gram Stain     Final   Value: ABUNDANT WBC PRESENT,BOTH PMN AND MONONUCLEAR     NO SQUAMOUS EPITHELIAL CELLS SEEN     ABUNDANT GRAM NEGATIVE RODS     Performed at [facility]   Culture     Final   Value: FEW ENTEROCOCCUS SPECIES     Performed at [facility]   Report Status 02/19/2013 FINAL   Final   Organism ID, Bacteria ENTEROCOCCUS SPECIES   Final  ANAEROBIC CULTURE     Status: None   Collection Time    02/15/13  6:35 PM      Result Value Range Status   Specimen Description KNEE RIGHT   Final   Special Requests ROCEPHIN   Final   Gram Stain     Final   Value: ABUNDANT WBC PRESENT,BOTH PMN AND MONONUCLEAR     NO SQUAMOUS EPITHELIAL CELLS SEEN     ABUNDANT GRAM NEGATIVE RODS     Performed at [facility]   Culture     Final   Value: MODERATE PREVOTELLA MELANINOGENICA     Note: BETA LACTAMASE POSITIVE     Performed at [facility]   Report Status 02/22/2013 FINAL   Final    Anti-infectives   Start     Dose/Rate Route Frequency Ordered Stop   02/25/13 1800  vancomycin (VANCOCIN) IVPB 750 mg/150 ml premix     750 mg 150 mL/hr over 60 Minutes Intravenous Every 12 hours 02/25/13 0628     02/25/13 0630  vancomycin (VANCOCIN) IVPB 750 mg/150 ml premix     750 mg 150 mL/hr over 60 Minutes Intravenous  Once 02/25/13 0627     02/22/13 0745  vancomycin (VANCOCIN) 500 mg in sodium chloride 0.9 % 100 mL IVPB  Status:  Discontinued     500 mg 100 mL/hr over 60 Minutes Intravenous Every 12 hours  02/22/13 0743 02/25/13 0628   02/20/13 0800  vancomycin (VANCOCIN) IVPB 750 mg/150 ml premix  Status:  Discontinued     750 mg 150 mL/hr over 60 Minutes Intravenous Every 12 hours 02/19/13 1926 02/22/13 0743   02/19/13 2030  vancomycin (VANCOCIN) 1,500 mg in sodium chloride 0.9 % 500 mL IVPB     1,500 mg 250 mL/hr over 120 Minutes Intravenous  Once 02/19/13 1926 02/19/13 2227   02/17/13 1400  metroNIDAZOLE (FLAGYL) tablet 500 mg  Status:  Discontinued     500 mg Oral 3 times per day 02/17/13 1102 02/23/13 1149   01/30/13 1600  cefTRIAXone (ROCEPHIN) 2 g in dextrose 5 % 50 mL IVPB  Status:  Discontinued     2 g 100 mL/hr over 30 Minutes Intravenous Every 24 hours 01/30/13 1535 02/19/13 1904   01/29/13 1600  ceFEPIme (MAXIPIME) 1 g in dextrose 5 % 50 mL IVPB  Status:  Discontinued     1 g 100 mL/hr over 30 Minutes Intravenous Every 8 hours 01/29/13 1505 01/30/13 1536   01/29/13 0600  vancomycin (VANCOCIN) IVPB 1000 mg/200 mL premix     1,000 mg 200 mL/hr over 60 Minutes Intravenous On call to O.R. 01/28/13 1424 01/28/13 1750   01/29/13 0600  vancomycin (VANCOCIN) 500 mg in sodium chloride 0.9 % 100 mL IVPB  Status:  Discontinued     500 mg 100 mL/hr over 60 Minutes Intravenous Every 12 hours 01/28/13 2140 01/30/13 1521   01/28/13 1835  vancomycin (VANCOCIN) powder  Status:  Discontinued       As needed 01/28/13 1835 01/28/13 1911      Assessment: Patient with prosthetic joint infection.  Vancomycin trough is low.  Goal of Therapy:  Vancomycin trough level 15-20 mcg/ml  Plan:  Measure antibiotic drug levels at steady state Follow up culture results Change to vancomycin 750mg  iv q12hr  [name] [name], [name] [name] 02/25/2013,6:30 AM

## 2013-02-25 NOTE — Progress Notes (Signed)
Subjective: 10 Days Post-Op Procedure(s) (LRB): IRRIGATION AND DEBRIDIMENT RIGHT KNEE  WITH EXCHANGE OF  ANTIBIOTIC SPACERS (Right) Patient reports pain as mild.    Objective: Vital signs in last 24 hours: Temp:  [98 F (36.7 C)-98.7 F (37.1 C)] 98.7 F (37.1 C) (12/01 0500) Pulse Rate:  [76-82] 77 (12/01 0500) Resp:  [16-18] 16 (12/01 0500) BP: (119-133)/(54-66) 129/63 mmHg (12/01 0500) SpO2:  [93 %-97 %] 93 % (12/01 0500)  Intake/Output from previous day: 11/30 0701 - 12/01 0700 In: 2184 [P.O.:1560; I.V.:424; IV Piggyback:200] Out: 2700 [Urine:2700] Intake/Output this shift: Total I/O In: 599.7 [P.O.:240; I.V.:259.7; IV Piggyback:100] Out: 1350 [Urine:1350]   Recent Labs  02/23/13 0413 02/24/13 0430 02/25/13 0500  HGB 7.8* 7.8* 7.6*    Recent Labs  02/24/13 0430 02/25/13 0500  WBC 10.6* 10.3  RBC 2.65* 2.58*  HCT 24.5* 23.6*  PLT 520* 511*    Recent Labs  02/24/13 0430 02/25/13 0500  NA 133* 132*  K 4.0 3.5  CL 96 96  CO2 26 25  BUN 7 6  CREATININE 0.58 0.58  GLUCOSE 88 127*  CALCIUM 8.5 8.4   No results found for this basename: LABPT, INR,  in the last 72 hours  Wound has opened up in area below patella. No erythema. mild bloody drainage  Assessment/Plan: 10 Days Post-Op Procedure(s) (LRB): IRRIGATION AND DEBRIDIMENT RIGHT KNEE  WITH EXCHANGE OF  ANTIBIOTIC SPACERS (Right) To surgery today for I & D ;removal of spacer and closure over drains. Loanne Drilling 02/25/2013, 6:44 AM

## 2013-02-25 NOTE — Anesthesia Postprocedure Evaluation (Signed)
  Anesthesia Post-op Note  Patient: [name] [name]  Procedure(s) Performed: Procedure(s) (LRB): IRRIGATION AND DEBRIDIMENT  RIGHT KNEE AND REMOVAL AND REPLACEMENT OF ANTIBIOTIC SPACERS RIGHT KNEE (Right)  Patient Location: PACU  Anesthesia Type: General  Level of Consciousness: awake and alert   Airway and Oxygen Therapy: Patient Spontanous Breathing  Post-op Pain: mild  Post-op Assessment: Post-op Vital signs reviewed, Patient's Cardiovascular Status Stable, Respiratory Function Stable, Patent Airway and No signs of Nausea or vomiting  Last Vitals:  Filed Vitals:   02/25/13 2110  BP:   Pulse: 79  Temp:   Resp: 14    Post-op Vital Signs: stable   Complications: No apparent anesthesia complications

## 2013-02-25 NOTE — H&P (View-Only) (Signed)
Subjective: 10 Days Post-Op Procedure(s) (LRB): IRRIGATION AND DEBRIDIMENT RIGHT KNEE  WITH EXCHANGE OF  ANTIBIOTIC SPACERS (Right) Patient reports pain as mild.    Objective: Vital signs in last 24 hours: Temp:  [98 F (36.7 C)-98.7 F (37.1 C)] 98.7 F (37.1 C) (12/01 0500) Pulse Rate:  [76-82] 77 (12/01 0500) Resp:  [16-18] 16 (12/01 0500) BP: (119-133)/(54-66) 129/63 mmHg (12/01 0500) SpO2:  [93 %-97 %] 93 % (12/01 0500)  Intake/Output from previous day: 11/30 0701 - 12/01 0700 In: 2184 [P.O.:1560; I.V.:424; IV Piggyback:200] Out: 2700 [Urine:2700] Intake/Output this shift: Total I/O In: 599.7 [P.O.:240; I.V.:259.7; IV Piggyback:100] Out: 1350 [Urine:1350]   Recent Labs  02/23/13 0413 02/24/13 0430 02/25/13 0500  HGB 7.8* 7.8* 7.6*    Recent Labs  02/24/13 0430 02/25/13 0500  WBC 10.6* 10.3  RBC 2.65* 2.58*  HCT 24.5* 23.6*  PLT 520* 511*    Recent Labs  02/24/13 0430 02/25/13 0500  NA 133* 132*  K 4.0 3.5  CL 96 96  CO2 26 25  BUN 7 6  CREATININE 0.58 0.58  GLUCOSE 88 127*  CALCIUM 8.5 8.4   No results found for this basename: LABPT, INR,  in the last 72 hours  Wound has opened up in area below patella. No erythema. mild bloody drainage  Assessment/Plan: 10 Days Post-Op Procedure(s) (LRB): IRRIGATION AND DEBRIDIMENT RIGHT KNEE  WITH EXCHANGE OF  ANTIBIOTIC SPACERS (Right) To surgery today for I & D ;removal of spacer and closure over drains. Eiliyah Reh V 02/25/2013, 6:44 AM  

## 2013-02-25 NOTE — Brief Op Note (Signed)
01/28/2013 - 02/25/2013  8:06 PM  PATIENT:  [name] [name]  79 y.o. female  PRE-OPERATIVE DIAGNOSIS:  Right knee infection  POST-OPERATIVE DIAGNOSIS:  right knee infection  PROCEDURE:  Procedure(s): IRRIGATION AND DEBRIDIMENT  RIGHT KNEE AND REMOVAL AND REPLACEMENT OF ANTIBIOTIC SPACERS RIGHT KNEE (Right)  SURGEON:  Surgeon(s) and Role:    * Loanne Drilling, MD - Primary  PHYSICIAN ASSISTANT:   ASSISTANTS: Avel Peace, PA-C   ANESTHESIA:   general  EBL:  Total I/O In: -  Out: 260 [Urine:260]  DRAINS: (Medium) Hemovact drain(s) in the right knee with  Suction Open   LOCAL MEDICATIONS USED:  NONE  COUNTS:  YES  TOURNIQUET:   Total Tourniquet Time Documented: Thigh (laterality) - 40 minutes Total: Thigh (laterality) - 40 minutes   DICTATION: .Other Dictation: Dictation Number [id number]  PLAN OF CARE: Admit to inpatient   PATIENT DISPOSITION:  PACU - hemodynamically stable.

## 2013-02-25 NOTE — Interval H&P Note (Signed)
History and Physical Interval Note:  02/25/2013 6:25 PM  [name] [name]  has presented today for surgery, with the diagnosis of replacement antibiotic spacer right knee  The various methods of treatment have been discussed with the patient and family. After consideration of risks, benefits and other options for treatment, the patient has consented to  Procedure(s): IRRIGATION AND DEBRIDIMENT  RIGHT KNEE AND REMOVAL AND REPLACEMENT OF ANTIBIOTIC SPACERS RIGHT KNEE (Right) as a surgical intervention .  The patient's history has been reviewed, patient examined, no change in status, stable for surgery.  I have reviewed the patient's chart and labs.  Questions were answered to the patient's satisfaction.     Loanne Drilling

## 2013-02-26 ENCOUNTER — Encounter (HOSPITAL_COMMUNITY): Payer: Self-pay | Admitting: Orthopedic Surgery

## 2013-02-26 LAB — CBC
HCT: 21.9 % — ABNORMAL LOW (ref 36.0–46.0)
MCHC: 32 g/dL (ref 30.0–36.0)
MCV: 92 fL (ref 78.0–100.0)
Platelets: 464 10*3/uL — ABNORMAL HIGH (ref 150–400)
RDW: 17.8 % — ABNORMAL HIGH (ref 11.5–15.5)
WBC: 13.8 10*3/uL — ABNORMAL HIGH (ref 4.0–10.5)

## 2013-02-26 LAB — BASIC METABOLIC PANEL
BUN: 5 mg/dL — ABNORMAL LOW (ref 6–23)
CO2: 26 mEq/L (ref 19–32)
Chloride: 99 mEq/L (ref 96–112)
Creatinine, Ser: 0.59 mg/dL (ref 0.50–1.10)
GFR calc Af Amer: >90 mL/min (ref >90)
GFR calc non Af Amer: 84 mL/min — ABNORMAL LOW (ref >90)
Sodium: 132 mEq/L — ABNORMAL LOW (ref 135–145)

## 2013-02-26 LAB — PREPARE RBC (CROSSMATCH)

## 2013-02-26 MED ORDER — ACETAMINOPHEN 325 MG PO TABS
650.0000 mg | ORAL_TABLET | Freq: Once | ORAL | Status: AC
  Administered 2013-02-26: 650 mg via ORAL
  Filled 2013-02-26: qty 2

## 2013-02-26 NOTE — Progress Notes (Signed)
CSW continues to follow this pt for emotional support and assistance with d/c planning. Clinical updates have been sent to [facility]. CSW will continue to follow .  Cori Razor LCSW [phone]

## 2013-02-26 NOTE — Op Note (Signed)
[name], IGLESIA                 ACCOUNT NO.:  [id number]  MEDICAL RECORD NO.:  [id number]  LOCATION:  1610                         FACILITY:  [facility]  PHYSICIAN:  Ollen Gross, M.D.    DATE OF BIRTH:  03-Mar-1931  DATE OF PROCEDURE:  02/25/2013 DATE OF DISCHARGE:                              OPERATIVE REPORT   PREOPERATIVE DIAGNOSIS:  Infected right knee.  POSTOPERATIVE DIAGNOSIS:  Infected right knee.  PROCEDURE:  Irrigation and debridement of right knee and exchange of antibiotic spacer.  SURGEON:  Ollen Gross, M.D.  ASSISTANT:  Alexzandrew L. Perkins, P.A.C.  ANESTHESIA:  General.  ESTIMATED BLOOD LOSS:  Minimal.  DRAINS:  Hemovac x1.  TOURNIQUET TIME:  40 minutes at 300 mmHg.  COMPLICATIONS:  None.  CONDITION:  Stable to recovery.  BRIEF CLINICAL NOTE:  [name] is an 79 year old female, very long complex history in regard to her right knee.  She has had a chronic infection and appears as though the infection component of her issue is improving.  She has reopened the wound and presents now for irrigation debridement, exchange of spacer and hopeful closure over drains.  PROCEDURE IN DETAIL:  After successful administration of general anesthetic, tourniquet placed on the right thigh.  Right lower extremity was prepped and draped in usual sterile fashion.  Prior to this, I had removed her previous sutures and staples.  The open area the size of a quarter and is over the tibial tubercle region.  A #10 blade to cut through the skin to the subcutaneous tissue.  I then used a fresh blade to make a small medial arthrotomy.  I was able to remove her previous spacer and once I removed that was able to shorten up the leg about an inch which will allow for a tension-free closure, the subcu and skin. We thoroughly irrigated the wound with liter of saline with pulsatile lavage.  Any devitalized tissue was removed with a rongeur.  This would be deep subcutaneous tissue.   This debridement is thus incisional because I cut with a knife and excisional because I used a rongeur to remove devitalized tissue.  Overall area debridement about 2 x 3 cm.  We then mixed 1 batch of cement with 1 g of vancomycin and once it was able to be molded  I made a essentially a fusion rod and passed into the tibial and femoral canals and vaulted up at the joint line, to be stable.  Once this hardened, it was very stable with essentially serving as an intramedullary fusion device.  We further irrigated with a saline. I was then able to close almost all of the deep tissue with interrupted #1 PDS.  There was an area over the proximal tibia where there was no deep tissue.  I placed a Hemovac drain and closed the subcu with interrupted 2-0 Vicryl.  Any devitalized skin was incised with a knife and removed.  The subcutaneous was closed without tension with interrupted 2-0 Vicryl.  Skin was then closed with a combination of nylon suture and staples.  The tourniquet was then released total time of 40 minutes.  There was no evidence of any bleeding.  The drain was hooked to suction.  A large bulky sterile dressing was applied, and she was placed into a knee immobilizer, awakened, and transported to recovery in stable condition.  Note that the surgical assistant was a medical necessity for this procedure to hold the leg in a reduced manner while the spacer is hardening and also to assist with opening and closure of this complex wound.     Ollen Gross, M.D.     FA/MEDQ  D:  02/25/2013  T:  02/26/2013  Job:  [id number]

## 2013-02-26 NOTE — Progress Notes (Signed)
Subjective: 1 Day Post-Op Procedure(s) (LRB): IRRIGATION AND DEBRIDIMENT  RIGHT KNEE AND REMOVAL AND REPLACEMENT OF ANTIBIOTIC SPACERS RIGHT KNEE (Right) Patient reports pain as mild.   Patient seen in rounds with Dr. Lequita Halt. She is doing well this morning Patient is well, but has had some minor complaints of pain in the knee, requiring pain medications We will start therapy today. She can only be TDWB to that leg.  HGB is lower today at 7.0. Will give blood today and recheck labs in the morning. Plan is to go Home after hospital stay.  Objective: Vital signs in last 24 hours: Temp:  [97.4 F (36.3 C)-99.5 F (37.5 C)] 98.1 F (36.7 C) (12/02 0626) Pulse Rate:  [64-95] 64 (12/02 0626) Resp:  [12-25] 16 (12/02 0626) BP: (118-168)/(52-88) 122/77 mmHg (12/02 0626) SpO2:  [96 %-100 %] 100 % (12/02 0626)  Intake/Output from previous day: 12/01 0701 - 12/02 0700 In: 1480 [P.O.:270; I.V.:1210] Out: 3375 [Urine:3250; Drains:125] Intake/Output this shift: Total I/O In: 240 [P.O.:240] Out: -    Recent Labs  02/24/13 0430 02/25/13 0500 02/26/13 0409  HGB 7.8* 7.6* 7.0*    Recent Labs  02/25/13 0500 02/26/13 0409  WBC 10.3 13.8*  RBC 2.58* 2.38*  HCT 23.6* 21.9*  PLT 511* 464*    Recent Labs  02/25/13 0500 02/26/13 0409  NA 132* 132*  K 3.5 4.2  CL 96 99  CO2 25 26  BUN 6 5*  CREATININE 0.58 0.59  GLUCOSE 127* 94  CALCIUM 8.4 8.2*   No results found for this basename: LABPT, INR,  in the last 72 hours  EXAM General - Patient is Alert, Appropriate and Oriented Extremity - Neurovascular intact Sensation intact distally Dressing - dressing C/D/I Motor Function - intact, moving foot and toes well on exam.   Past Medical History  Diagnosis Date  . Mixed hyperlipidemia   . Hypothyroidism   . GERD (gastroesophageal reflux disease)   . Atrial fibrillation     Remote amiodarone use, recurred in 07/2011 post op from knee arthroplasty  . Obese   . History of  blood transfusion 08/31/2011    A+ type  . History of GI bleed     Gastritis, was on Xarelto and ASA  . Carpal tunnel syndrome   . Arthritis     Knees  . DJD (degenerative joint disease), thoracolumbar   . Rheumatoid arthritis   . H/O bronchitis   . Hemorrhoids   . Measles     as child  . Mumps     as child  . Carotid artery disease   . PONV (postoperative nausea and vomiting)   . Anemia   . Difficulty sleeping   . 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   Weight as  of this encounter: 68 kg (149 lb 14.6 oz). Up with therapy - TDWB only  DVT Prophylaxis - SCD's  Touch Down Weight Bearing ONLY and No Bending or No Motion  WBC at 13.8, up from 10.3 which is likely due to surgical stress. Na+ stable at 132 HGB low at 7.0. Will give two units today. Continue with IV vancomycin PICC line for IV antibiotic therapy.  Group B Sttrep infection Eterococcus infection Dr. Lequita Halt switched the patient out from the hard collar to a soft collar. Wear at all times except may lossen up a little while in bed supported.    Patrica Duel 02/26/2013, 8:44 AM

## 2013-02-26 NOTE — Progress Notes (Signed)
Physical Therapy Treatment Patient Details Name: [name] [name] MRN: [id number] DOB: [birth year] Today's Date: 02/26/2013 Time: 4098-1191 PT Time Calculation (min): 24 min  PT Assessment / Plan / Recommendation  History of Present Illness 79 yo female s/p R knee I&D, resection, antibiotic spacer placed. Recent hx of fall (11/1) resulting in multiple cervical fractures requires soft collar at all times. S/p I/D and rplecement of antibiotic spacer on 02/25/13.   PT Comments   Pt's Hgb 7.0 but tolerated getting OOB and pivot to recliner,. Continue PT with goals set.   Follow Up Recommendations  SNF     Does the patient have the potential to tolerate intense rehabilitation     Barriers to Discharge        Equipment Recommendations  Rolling walker with 5" wheels;Wheelchair (measurements PT)    Recommendations for Other Services    Frequency Min 3X/week   Progress towards PT Goals Progress towards PT goals: Progressing toward goals  Plan Current plan remains appropriate    Precautions / Restrictions Precautions Precautions: Fall;Cervical Precaution Comments: ki at all times. Knee Immobilizer - Right: On at all times Restrictions RLE Weight Bearing: Touchdown weight bearing   Pertinent Vitals/Pain R knee 5, had meds.    Mobility  Bed Mobility Bed Mobility: Supine to Sit Supine to Sit: 1: +2 Total assist Supine to Sit: Patient Percentage: 60% Details for Bed Mobility Assistance: assist bil LEs and slight assist for trunk and increased time Transfers Sit to Stand: 1: +2 Total assist Sit to Stand: Patient Percentage: 60% Stand to Sit: To chair/3-in-1;2: Max assist;3: Mod assist Stand to Sit: Patient Percentage: 60% Stand Pivot Transfers: 1: +2 Total assist Stand Pivot Transfers: Patient Percentage: 60% Details for Transfer Assistance: verbal cues for LE and UE placement including keeping R LE forward and TDWB only as well as reaching for armrests with stand  pivots Ambulation/Gait Ambulation/Gait Assistance: Not tested (comment)    Exercises     PT Diagnosis:    PT Problem List:   PT Treatment Interventions:     PT Goals (current goals can now be found in the care plan section)    Visit Information  Last PT Received On: 02/26/13 Assistance Needed: +2 History of Present Illness: 79 yo female s/p R knee I&D, resection, antibiotic spacer placed. Recent hx of fall (11/1) resulting in multiple cervical fractures requires soft collar at all times. S/p I/D and rplecement of antibiotic spacer on 02/25/13.    Subjective Data      Cognition  Cognition Arousal/Alertness: Awake/alert    Balance     End of Session PT - End of Session Activity Tolerance: Patient tolerated treatment well;Patient limited by fatigue Patient left: in chair;with call bell/phone within reach Nurse Communication: Mobility status   GP     Rada Hay 02/26/2013, 5:44 PM

## 2013-02-27 LAB — URINALYSIS, ROUTINE W REFLEX MICROSCOPIC
Hgb urine dipstick: NEGATIVE
Nitrite: NEGATIVE
Specific Gravity, Urine: 1.02 (ref 1.005–1.030)
pH: 6.5 (ref 5.0–8.0)

## 2013-02-27 LAB — TYPE AND SCREEN: ABO/RH(D): A POS

## 2013-02-27 LAB — CBC
HCT: 28 % — ABNORMAL LOW (ref 36.0–46.0)
Hemoglobin: 9.1 g/dL — ABNORMAL LOW (ref 12.0–15.0)
MCH: 29.3 pg (ref 26.0–34.0)
MCV: 90 fL (ref 78.0–100.0)
Platelets: 389 10*3/uL (ref 150–400)
RBC: 3.11 MIL/uL — ABNORMAL LOW (ref 3.87–5.11)
RDW: 17.3 % — ABNORMAL HIGH (ref 11.5–15.5)
WBC: 15.4 10*3/uL — ABNORMAL HIGH (ref 4.0–10.5)

## 2013-02-27 LAB — BASIC METABOLIC PANEL
BUN: 7 mg/dL (ref 6–23)
CO2: 27 mEq/L (ref 19–32)
Calcium: 8.2 mg/dL — ABNORMAL LOW (ref 8.4–10.5)
Chloride: 93 mEq/L — ABNORMAL LOW (ref 96–112)
GFR calc Af Amer: >90 mL/min (ref >90)
Glucose, Bld: 109 mg/dL — ABNORMAL HIGH (ref 70–99)
Potassium: 4 mEq/L (ref 3.5–5.1)
Sodium: 126 mEq/L — ABNORMAL LOW (ref 135–145)

## 2013-02-27 LAB — URINE MICROSCOPIC-ADD ON

## 2013-02-27 NOTE — Progress Notes (Signed)
NUTRITION FOLLOW UP  Intervention:   Continue Ensure TID Continue MVI with minerals daily  Nutrition Dx:   Inadequate oral intake related to decreased appetite as evidenced by 11% wt loss; ongoing, varied po intake 0-75% of most meals  Goal:   Pt to meet >/= 90% of their estimated nutrition needs; not likely met  Monitor:   Meal completion, acceptance of supplements, wound healing, labs  Assessment:   79 y.o. female with group b strep prosthetic joint infection s/p antibiotic spacer C/b wound dehiscence with enterococcus   Pt is now 2 days s/p irrigation and debridement of right knee and removal and replacement of antibiotic spacers of right knee. Per RN pt has been having a bad day due to increased pain. Per nursing notes pt ate 100% of 2 meals today but, she only ate a few bites at her meals yesterday. Pt reports that she is drinking her Ensure at least twice daily.  Pt states she is ordering 3 meals and she is trying to eat as tolerated. Encouraged PO intake as tolerated with continued intake of Ensure Complete 2-3 times daily.   Height: Ht Readings from Last 1 Encounters:  01/28/13 5' 5.35" (1.66 m)    Weight Status:   Wt Readings from Last 1 Encounters:  01/28/13 149 lb 14.6 oz (68 kg)    Re-estimated needs:  Kcal: 1500-1700 Protein: 80-90 g Fluid: 1.5-1.7 L/day  Skin: Incision on right knee; non-pitting RLE edema  Diet Order: General   Intake/Output Summary (Last 24 hours) at 02/27/13 1528 Last data filed at 02/27/13 1300  Gross per 24 hour  Intake 1292.16 ml  Output   1769 ml  Net -476.84 ml    Last BM: 12/3 Per RN report   Labs:   Recent Labs Lab 02/25/13 0500 02/26/13 0409 02/27/13 0545  NA 132* 132* 126*  K 3.5 4.2 4.0  CL 96 99 93*  CO2 25 26 27   BUN 6 5* 7  CREATININE 0.58 0.59 0.56  CALCIUM 8.4 8.2* 8.2*  GLUCOSE 127* 94 109*    CBG (last 3)  No results found for this basename: GLUCAP,  in the last 72 hours  Scheduled Meds: .  ALPRAZolam  0.5 mg Oral Daily   And  . ALPRAZolam  1 mg Oral QHS  . docusate sodium  100 mg Oral BID  . esomeprazole  40 mg Oral Daily  . feeding supplement (ENSURE COMPLETE)  237 mL Oral TID WC  . ferrous sulfate  325 mg Oral BID  . fesoterodine  8 mg Oral Daily  . gabapentin  300 mg Oral BID  . levothyroxine  150 mcg Oral QAC breakfast  . multivitamin with minerals  1 tablet Oral Daily  . simvastatin  20 mg Oral q morning - 10a  . vancomycin  750 mg Intravenous Q12H    Continuous Infusions: . sodium chloride 20 mL/hr at 02/24/13 0847  . sodium chloride 20 mL/hr at 02/27/13 0600    Ian Malkin RD, LDN Inpatient Clinical Dietitian Pager: [phone] After Hours Pager: [phone]

## 2013-02-27 NOTE — Progress Notes (Signed)
Subjective: 2 Days Post-Op Procedure(s) (LRB): IRRIGATION AND DEBRIDIMENT  RIGHT KNEE AND REMOVAL AND REPLACEMENT OF ANTIBIOTIC SPACERS RIGHT KNEE (Right) Patient reports pain as mild and moderate.   Patient seen in rounds with Dr. Lequita Halt. Patient is having problems with pain in the knee, requiring pain medications We will start therapy today.  Plan is to go Skilled nursing facility after hospital stay.  Objective: Vital signs in last 24 hours: Temp:  [97.9 F (36.6 C)-100.1 F (37.8 C)] 97.9 F (36.6 C) (12/03 0524) Pulse Rate:  [74-90] 78 (12/03 0524) Resp:  [14-16] 14 (12/03 0524) BP: (116-180)/(42-88) 150/78 mmHg (12/03 0524) SpO2:  [100 %] 100 % (12/03 0524)  Intake/Output from previous day: 12/02 0701 - 12/03 0700 In: 1939.7 [P.O.:840; I.V.:179.7; Blood:770; IV Piggyback:150] Out: 1769 [Urine:1750; Drains:19] Intake/Output this shift:     Recent Labs  02/25/13 0500 02/26/13 0409 02/27/13 0545  HGB 7.6* 7.0* 9.1*    Recent Labs  02/26/13 0409 02/27/13 0545  WBC 13.8* 15.4*  RBC 2.38* 3.11*  HCT 21.9* 28.0*  PLT 464* 389    Recent Labs  02/26/13 0409 02/27/13 0545  NA 132* 126*  K 4.2 4.0  CL 99 93*  CO2 26 27  BUN 5* 7  CREATININE 0.59 0.56  GLUCOSE 94 109*  CALCIUM 8.2* 8.2*   No results found for this basename: LABPT, INR,  in the last 72 hours  EXAM General - Patient is Alert and Appropriate Extremity - Neurovascular intact Sensation intact distally Dressing - scant drainage Incision - staples intact, scant bloody drainage no purulence Motor Function - intact, moving foot and toes well on exam.   Past Medical History  Diagnosis Date  . Mixed hyperlipidemia   . Hypothyroidism   . GERD (gastroesophageal reflux disease)   . Atrial fibrillation     Remote amiodarone use, recurred in 07/2011 post op from knee arthroplasty  . Obese   . History of blood transfusion 08/31/2011    A+ type  . History of GI bleed     Gastritis, was on Xarelto  and ASA  . Carpal tunnel syndrome   . Arthritis     Knees  . DJD (degenerative joint disease), thoracolumbar   . Rheumatoid arthritis   . H/O bronchitis   . Hemorrhoids   . Measles     as child  . Mumps     as child  . Carotid artery disease   . PONV (postoperative nausea and vomiting)   . Anemia   . Difficulty sleeping   . 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   Weight as of this encounter: 68 kg (149 lb 14.6 oz). Up with therapy Discharge to SNF  DVT Prophylaxis - SCD's Touch Down Weight Bearing ONLY and No  Bending or No Motion  WBC at 15.4, up from 13.8 which is likely due to surgical stress.  Na+ down at 126 HGB low at 7.0 yesterday. Gave two units and HGB up to 9.1.  Continue with IV vancomycin  PICC line for IV antibiotic therapy.  Group B Sttrep infection  Eterococcus infection  Dr. Lequita Halt switched the patient out from the hard collar to a soft collar. Wear at all times except may lossen up a little while in bed supported.   PERKINS, ALEXZANDREW 02/27/2013, 10:31 AM

## 2013-02-27 NOTE — Progress Notes (Signed)
Physical Therapy Treatment Patient Details Name: [name] [name] MRN: [id number] DOB: [birth year] Today's Date: 02/27/2013 Time: 1445-1510 PT Time Calculation (min): 25 min  PT Assessment / Plan / Recommendation  History of Present Illness 79 yo female s/p R knee I&D, resection, antibiotic spacer placed. Recent hx of fall (11/1) resulting in multiple cervical fractures requires soft collar at all times. S/p I/D and rplecement of antibiotic spacer on 02/25/13.   PT Comments   Pt reports this has been the hardest day with pain. Pt did state she wanted to get up to the BR. Pt tolerated well.  Follow Up Recommendations  SNF     Does the patient have the potential to tolerate intense rehabilitation     Barriers to Discharge        Equipment Recommendations       Recommendations for Other Services    Frequency Min 3X/week   Progress towards PT Goals Progress towards PT goals: Progressing toward goals  Plan Current plan remains appropriate    Precautions / Restrictions Precautions Precautions: Fall;Cervical Precaution Comments: ki at all times. Knee Immobilizer - Right: On at all times   Pertinent Vitals/Pain 10 R knee, after meds, repositioned.    Mobility  Bed Mobility Supine to Sit: 2: Max assist Sit to Supine: 2: Max assist Details for Bed Mobility Assistance: assist with both legs to swing around to edge and return to bed. Transfers Transfers: Sit to Stand;Stand to Sit Sit to Stand: 3: Mod assist;From bed;From chair/3-in-1;With upper extremity assist Stand to Sit: To chair/3-in-1;With upper extremity assist;3: Mod assist Stand Pivot Transfers: 3: Mod assist Details for Transfer Assistance: verbal cues for LE and UE placement including keeping R LE forward and TDWB only as well as reaching for armrests with stand pivots, extra time to balance and scoot on L foot to turn. Ambulation/Gait Ambulation/Gait Assistance: Not tested (comment)    Exercises     PT Diagnosis:    PT  Problem List:   PT Treatment Interventions:     PT Goals (current goals can now be found in the care plan section)    Visit Information  Last PT Received On: 02/27/13 Assistance Needed: +1 History of Present Illness: 79 yo female s/p R knee I&D, resection, antibiotic spacer placed. Recent hx of fall (11/1) resulting in multiple cervical fractures requires soft collar at all times. S/p I/D and rplecement of antibiotic spacer on 02/25/13.    Subjective Data      Cognition  Cognition Arousal/Alertness: Awake/alert    Balance     End of Session PT - End of Session Activity Tolerance: Patient limited by fatigue;Patient limited by pain Patient left: in bed;with call bell/phone within reach Nurse Communication: Mobility status   GP     Rada Hay 02/27/2013, 4:32 PM

## 2013-02-28 LAB — BASIC METABOLIC PANEL
BUN: 9 mg/dL (ref 6–23)
CO2: 29 mEq/L (ref 19–32)
Calcium: 8.2 mg/dL — ABNORMAL LOW (ref 8.4–10.5)
Creatinine, Ser: 0.6 mg/dL (ref 0.50–1.10)
GFR calc Af Amer: >90 mL/min (ref >90)
GFR calc non Af Amer: 84 mL/min — ABNORMAL LOW (ref >90)

## 2013-02-28 LAB — CBC
MCHC: 32 g/dL (ref 30.0–36.0)
MCV: 91 fL (ref 78.0–100.0)
Platelets: 353 10*3/uL (ref 150–400)
RBC: 3.12 MIL/uL — ABNORMAL LOW (ref 3.87–5.11)
RDW: 16.9 % — ABNORMAL HIGH (ref 11.5–15.5)
WBC: 14 10*3/uL — ABNORMAL HIGH (ref 4.0–10.5)

## 2013-02-28 NOTE — Progress Notes (Signed)
Subjective: 3 Days Post-Op Procedure(s) (LRB): IRRIGATION AND DEBRIDIMENT  RIGHT KNEE AND REMOVAL AND REPLACEMENT OF ANTIBIOTIC SPACERS RIGHT KNEE (Right) Patient reports pain as mild.   Patient seen in rounds for Dr. Lequita Halt.  She is feeling a little better today as compared to yesterday. Patient is well, but has had some minor complaints of pain in the knee, requiring pain medications Plan is to go Skilled nursing facility after hospital stay.  Objective: Vital signs in last 24 hours: Temp:  [98.3 F (36.8 C)-98.5 F (36.9 C)] 98.3 F (36.8 C) (12/04 0540) Pulse Rate:  [61-80] 61 (12/04 0540) Resp:  [14-16] 16 (12/04 0540) BP: (144-165)/(69-80) 144/69 mmHg (12/04 0540) SpO2:  [94 %-100 %] 94 % (12/04 0540)  Intake/Output from previous day:  Intake/Output Summary (Last 24 hours) at 02/28/13 0903 Last data filed at 02/28/13 0700  Gross per 24 hour  Intake   1995 ml  Output   2688 ml  Net   -693 ml    Intake/Output this shift:    Labs:  Recent Labs  02/26/13 0409 02/27/13 0545 02/28/13 0506  HGB 7.0* 9.1* 9.1*    Recent Labs  02/27/13 0545 02/28/13 0506  WBC 15.4* 14.0*  RBC 3.11* 3.12*  HCT 28.0* 28.4*  PLT 389 353    Recent Labs  02/27/13 0545 02/28/13 0506  NA 126* 129*  K 4.0 4.3  CL 93* 94*  CO2 27 29  BUN 7 9  CREATININE 0.56 0.60  GLUCOSE 109* 101*  CALCIUM 8.2* 8.2*   No results found for this basename: LABPT, INR,  in the last 72 hours  EXAM General - Patient is Alert and Appropriate Extremity - Neurovascular intact Sensation intact distally Dorsiflexion/Plantar flexion intact Dressing/Incision - clean, healing, some scant drainage to incision Motor Function - intact, moving foot and toes well on exam.   Past Medical History  Diagnosis Date  . Mixed hyperlipidemia   . Hypothyroidism   . GERD (gastroesophageal reflux disease)   . Atrial fibrillation     Remote amiodarone use, recurred in 07/2011 post op from knee arthroplasty    . Obese   . History of blood transfusion 08/31/2011    A+ type  . History of GI bleed     Gastritis, was on Xarelto and ASA  . Carpal tunnel syndrome   . Arthritis     Knees  . DJD (degenerative joint disease), thoracolumbar   . Rheumatoid arthritis   . H/O bronchitis   . Hemorrhoids   . Measles     as child  . Mumps     as child  . Carotid artery disease   . PONV (postoperative nausea and vomiting)   . Anemia   . Difficulty sleeping   . 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   Weight as of this encounter: 68 kg (149 lb 14.6 oz). Up with therapy Discharge to SNF when stable, still concerned about wound since it has dehisced several times.  DVT Prophylaxis - SCD's Touch Down Weight Bearing ONLY and No Bending or No Motion  WBC at 14, up from 13.8 which is likely due to surgical stress.  Na+ back up to 129  HGB low at 7.0 two days ago. Gave two units and HGB up to 9.1. Stable again today. Continue with IV vancomycin  PICC line for IV antibiotic therapy.  Group B Sttrep infection  Eterococcus infection  Dr. Lequita Halt switched the patient out from the hard collar to a soft collar. Wear at all times except may lossen up a little while in bed  supported.    PERKINS, ALEXZANDREW 02/28/2013, 9:03 AM

## 2013-02-28 NOTE — Progress Notes (Signed)
Physical Therapy Treatment Patient Details Name: [name] [name] MRN: [id number] DOB: [birth year] Today's Date: 02/28/2013 Time: 0865-7846 PT Time Calculation (min): 46 min  PT Assessment / Plan / Recommendation  History of Present Illness 79 yo female s/p R knee I&D, resection, antibiotic spacer placed. Recent hx of fall (11/1) resulting in multiple cervical fractures requires soft collar at all times. S/p I/D and rplecement of antibiotic spacer on 02/25/13.   PT Comments   Pt is more cheerful today./ Pt requested to get into [facility] so she can get out of the room. Pt tolerated well. Stated she was tired but enjoyed the outing.  Follow Up Recommendations  SNF     Does the patient have the potential to tolerate intense rehabilitation     Barriers to Discharge        Equipment Recommendations       Recommendations for Other Services    Frequency Min 3X/week   Progress towards PT Goals Progress towards PT goals: Progressing toward goals  Plan Current plan remains appropriate    Precautions / Restrictions Precautions Precautions: Fall;Cervical Precaution Comments: ki at all times. Required Braces or Orthoses: Knee Immobilizer - Right Knee Immobilizer - Right: On at all times Restrictions RLE Weight Bearing: Touchdown weight bearing   Pertinent Vitals/Pain No c/o pain during transfers.    Mobility  Bed Mobility Supine to Sit: 4: Min assist Details for Bed Mobility Assistance: pt was able to slide Rleg to edge of bed, min assistance to sit up using rail. Transfers Transfers: Sit to Stand;Stand to Sit;Stand Pivot Transfers Sit to Stand: 3: Mod assist;From bed;From chair/3-in-1;With upper extremity assist Stand to Sit: To chair/3-in-1;With upper extremity assist;4: Min Designer, television/film set Transfers: 3: Mod assist Details for Transfer Assistance: verbal cues for LE and UE placement including keeping R LE forward and TDWB only as well as reaching for armrests with stand pivots, extra time  to balance and scoot on L foot to turn. Ambulation/Gait Ambulation/Gait Assistance: Not tested (comment) Ambulation/Gait Assistance Details: pt stood at [facility] for [facility] to be placed. Corporate treasurer: Yes Wheelchair Assistance: 4: Geographical information systems officer Details (indicate cue type and reason): Pt locked and unlocked WC, extra time required for propulsion. Wheelchair Propulsion: Both upper extremities Wheelchair Parts Management: Needs assistance Distance: 20'    Exercises     PT Diagnosis:    PT Problem List:   PT Treatment Interventions:     PT Goals (current goals can now be found in the care plan section)    Visit Information  Last PT Received On: 02/28/13 Assistance Needed: +1 History of Present Illness: 79 yo female s/p R knee I&D, resection, antibiotic spacer placed. Recent hx of fall (11/1) resulting in multiple cervical fractures requires soft collar at all times. S/p I/D and rplecement of antibiotic spacer on 02/25/13.    Subjective Data      Cognition  Cognition Arousal/Alertness: Awake/alert    Balance     End of Session PT - End of Session Equipment Utilized During Treatment: Gait belt Activity Tolerance: Patient tolerated treatment well Patient left: in chair;with call bell/phone within reach Nurse Communication: Mobility status   GP     Rada Hay 02/28/2013, 1:23 PM

## 2013-02-28 NOTE — Progress Notes (Signed)
Discussed in LOS rounds. 

## 2013-02-28 NOTE — Progress Notes (Signed)
ANTIBIOTIC CONSULT NOTE - FOLLOW UP  Pharmacy Consult for Vancomycin Indication: prosthetic joint infection  Allergies  Allergen Reactions  . Omeprazole Nausea And Vomiting  . Penicillins Itching  . Morphine Rash    And hot flashes  . Xarelto [Rivaroxaban] Other (See Comments)    Bleeding,spitting up blood    Patient Measurements: Height: 5' 5.35" (166 cm) (Documented in chart 01/25/13) Weight: 149 lb 14.6 oz (68 kg) (Documented in chart 01/25/13) IBW/kg (Calculated) : 57.81 Adjusted Body Weight:   Vital Signs: Temp: 98.3 F (36.8 C) (12/04 0540) Temp src: Oral (12/04 0540) BP: 144/69 mmHg (12/04 0540) Pulse Rate: 61 (12/04 0540) Intake/Output from previous day: 12/03 0701 - 12/04 0700 In: 2235 [P.O.:1290; I.V.:495; IV Piggyback:450] Out: 2688 [Urine:2675; Drains:12; Stool:1] Intake/Output from this shift: Total I/O In: -  Out: 300 [Urine:300]  Labs:  Recent Labs  02/26/13 0409 02/27/13 0545 02/28/13 0506  WBC 13.8* 15.4* 14.0*  HGB 7.0* 9.1* 9.1*  PLT 464* 389 353  CREATININE 0.59 0.56 0.60   Estimated Creatinine Clearance: 51.2 ml/min (by C-G formula based on Cr of 0.6). No results found for this basename: VANCOTROUGH, VANCOPEAK, VANCORANDOM, GENTTROUGH, GENTPEAK, GENTRANDOM, TOBRATROUGH, TOBRAPEAK, TOBRARND, AMIKACINPEAK, AMIKACINTROU, AMIKACIN,  in the last 72 hours   Microbiology: Recent Results (from the past 720 hour(s))  CULTURE, BLOOD (ROUTINE X 2)     Status: None   Collection Time    02/05/13  6:15 PM      Result Value Range Status   Specimen Description BLOOD LEFT HAND   Final   Special Requests BOTTLES DRAWN AEROBIC AND ANAEROBIC 10CC   Final   Culture  Setup Time     Final   Value: 02/05/2013 22:14     Performed at [facility]   Culture     Final   Value: NO GROWTH 5 DAYS     Performed at [facility]   Report Status 02/11/2013 FINAL   Final  CULTURE, BLOOD (ROUTINE X 2)     Status: None   Collection Time    02/05/13   6:45 PM      Result Value Range Status   Specimen Description BLOOD LEFT ARM   Final   Special Requests BOTTLES DRAWN AEROBIC AND ANAEROBIC 10CC   Final   Culture  Setup Time     Final   Value: 02/05/2013 22:15     Performed at [facility]   Culture     Final   Value: NO GROWTH 5 DAYS     Performed at [facility]   Report Status 02/11/2013 FINAL   Final  URINE CULTURE     Status: None   Collection Time    02/05/13  8:01 PM      Result Value Range Status   Specimen Description URINE, RANDOM   Final   Special Requests Normal   Final   Culture  Setup Time     Final   Value: 02/06/2013 01:09     Performed at [facility]   Culture     Final   Value: NO GROWTH     Performed at [facility]   Report Status 02/06/2013 FINAL   Final  CLOSTRIDIUM DIFFICILE BY PCR     Status: None   Collection Time    02/08/13 12:00 AM      Result Value Range Status   C difficile by pcr NEGATIVE  NEGATIVE Final   Comment: Performed at [facility]  WOUND CULTURE     Status: None   Collection Time    02/15/13  6:35 PM      Result Value Range Status   Specimen Description KNEE RIGHT   Final   Special Requests ROCEPHIN   Final   Gram Stain     Final   Value: ABUNDANT WBC PRESENT,BOTH PMN AND MONONUCLEAR     NO SQUAMOUS EPITHELIAL CELLS SEEN     ABUNDANT GRAM NEGATIVE RODS     Performed at [facility]   Culture     Final   Value: FEW ENTEROCOCCUS SPECIES     Performed at [facility]   Report Status 02/19/2013 FINAL   Final   Organism ID, Bacteria ENTEROCOCCUS SPECIES   Final  ANAEROBIC CULTURE     Status: None   Collection Time    02/15/13  6:35 PM      Result Value Range Status   Specimen Description KNEE RIGHT   Final   Special Requests ROCEPHIN   Final   Gram Stain     Final   Value: ABUNDANT WBC PRESENT,BOTH PMN AND MONONUCLEAR     NO SQUAMOUS EPITHELIAL CELLS SEEN     ABUNDANT GRAM NEGATIVE RODS     Performed at [facility]   Culture     Final   Value: MODERATE PREVOTELLA MELANINOGENICA     Note: BETA LACTAMASE POSITIVE     Performed at [facility]   Report Status 02/22/2013 FINAL   Final    Anti-infectives   Start     Dose/Rate Route Frequency Ordered Stop   02/25/13 1936  vancomycin (VANCOCIN) powder  Status:  Discontinued       As needed 02/25/13 1936 02/25/13 2009   02/25/13 1000  vancomycin (VANCOCIN) IVPB 750 mg/150 ml premix     750 mg 150 mL/hr over 60 Minutes Intravenous Every 12 hours 02/25/13 0628     02/25/13 0630  vancomycin (VANCOCIN) IVPB 750 mg/150 ml premix  Status:  Discontinued     750 mg 150 mL/hr over 60 Minutes Intravenous  Once 02/25/13 0627 02/25/13 0850   02/22/13 0745  vancomycin (VANCOCIN) 500 mg in sodium chloride 0.9 % 100 mL IVPB  Status:  Discontinued     500 mg 100 mL/hr over 60 Minutes Intravenous Every 12 hours 02/22/13 0743 02/25/13 0628   02/20/13 0800  vancomycin (VANCOCIN) IVPB 750 mg/150 ml premix  Status:  Discontinued     750 mg 150 mL/hr over 60 Minutes Intravenous Every 12 hours 02/19/13 1926 02/22/13 0743   02/19/13 2030  vancomycin (VANCOCIN) 1,500 mg in sodium chloride 0.9 % 500 mL IVPB     1,500 mg 250 mL/hr over 120 Minutes Intravenous  Once 02/19/13 1926 02/19/13 2227   02/17/13 1400  metroNIDAZOLE (FLAGYL) tablet 500 mg  Status:  Discontinued     500 mg Oral 3 times per day 02/17/13 1102 02/23/13 1149   01/30/13 1600  cefTRIAXone (ROCEPHIN) 2 g in dextrose 5 % 50 mL IVPB  Status:  Discontinued     2 g 100 mL/hr over 30 Minutes Intravenous Every 24 hours 01/30/13 1535 02/19/13 1904   01/29/13 1600  ceFEPIme (MAXIPIME) 1 g in dextrose 5 % 50 mL IVPB  Status:  Discontinued     1 g 100 mL/hr over 30 Minutes Intravenous Every 8 hours 01/29/13 1505 01/30/13 1536   01/29/13 0600  vancomycin (VANCOCIN) IVPB 1000 mg/200 mL premix  1,000 mg 200 mL/hr over 60 Minutes Intravenous On call to O.R. 01/28/13 1424 01/28/13 1750   01/29/13 0600   vancomycin (VANCOCIN) 500 mg in sodium chloride 0.9 % 100 mL IVPB  Status:  Discontinued     500 mg 100 mL/hr over 60 Minutes Intravenous Every 12 hours 01/28/13 2140 01/30/13 1521   01/28/13 1835  vancomycin (VANCOCIN) powder  Status:  Discontinued       As needed 01/28/13 1835 01/28/13 1911      Assessment: 79 yo F with PJI, now s/p resection of R TKR and placement of antibiotic spacer 11/3. Cultures  grew GBS and bacteroides. Clinical course complicated by wound dehiscence; wound cx grew enterococcus and pt is PCN-allergic.   11/3 >> Vancomycin >>11/5 11/4 >> Cefepime >> 11/5 11/5 >> Ceftriaxone >> 11/25 11/23 >> Flagyl >> 11/29 11/25 >> vancomycin >>  Tmax: afeb WBCs: 14 Renal: Scr 0.60 (stable); CrCl 51CG, 63N (execellent UOP)  11/3: intra-op wound cx >> GBS 11/3: anaerboic wound >> Bacteroides species 11/11 blood x 2 >> NGF 11/11 urine >> negative 11/14 C.diff >> negative 11/21 R knee anaerobic >> prevotella melaninogenica, beta-lactamase (+) 11/21 R Knee >> enterococcus sp  Goal of Therapy:  Vancomycin trough level 15-20 mcg/ml  Plan:  Day #10 vancomycin  Continue vancomycin 750mg  IV q12h  Need recheck vancomycin trough ~5-7 days from previous level on 12/1  Daily BMP ordered per Ortho  Juliette Alcide, PharmD, BCPS.   Pager: [phone]  02/28/2013,12:07 PM

## 2013-03-01 LAB — BASIC METABOLIC PANEL
Calcium: 8.4 mg/dL (ref 8.4–10.5)
Chloride: 95 mEq/L — ABNORMAL LOW (ref 96–112)
Creatinine, Ser: 0.58 mg/dL (ref 0.50–1.10)
GFR calc Af Amer: >90 mL/min (ref >90)
GFR calc non Af Amer: 85 mL/min — ABNORMAL LOW (ref >90)
Glucose, Bld: 82 mg/dL (ref 70–99)
Potassium: 4 mEq/L (ref 3.5–5.1)
Sodium: 132 mEq/L — ABNORMAL LOW (ref 135–145)

## 2013-03-01 LAB — CBC
HCT: 29.5 % — ABNORMAL LOW (ref 36.0–46.0)
MCV: 91.9 fL (ref 78.0–100.0)
Platelets: 422 10*3/uL — ABNORMAL HIGH (ref 150–400)
RDW: 16.4 % — ABNORMAL HIGH (ref 11.5–15.5)
WBC: 11.5 10*3/uL — ABNORMAL HIGH (ref 4.0–10.5)

## 2013-03-01 NOTE — Discharge Summary (Addendum)
Physician Discharge Summary   Patient ID: [name] [name] MRN: [id number] DOB/AGE: [birth year] 79 y.o.  Admit date: 01/28/2013 Discharge date:  Monday 03-04-2013  Primary Diagnosis:  Right Knee Infection  Right Knee Wound Dehiscence  S/P Right Total Knee Replacement  Admission Diagnoses:  Past Medical History  Diagnosis Date  . Mixed hyperlipidemia   . Hypothyroidism   . GERD (gastroesophageal reflux disease)   . Atrial fibrillation     Remote amiodarone use, recurred in 07/2011 post op from knee arthroplasty  . Obese   . History of blood transfusion 08/31/2011    A+ type  . History of GI bleed     Gastritis, was on Xarelto and ASA  . Carpal tunnel syndrome   . Arthritis     Knees  . DJD (degenerative joint disease), thoracolumbar   . Rheumatoid arthritis   . H/O bronchitis   . Hemorrhoids   . Measles     as child  . Mumps     as child  . Carotid artery disease   . PONV (postoperative nausea and vomiting)   . Anemia   . Difficulty sleeping   . 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   Weight as of this encounter: 68 kg (149 lb 14.6 oz).  Procedure:  #1 - 01/28/2013 - Right knee resection arthroplasty, antibiotic cement spacer placement.  #2 - 02/06/2013 - Irrigation and debridement of right knee.  #3 - 02/15/2013 - Irrigation and debridement of right knee with exchange of  antibiotic spacer.  #4 - 02/25/2013 - Irrigation and debridement of right knee and exchange of antibiotic spacer.  Transfusions: 02/02/2013 - 2 units 02/06/2013 - 2 units intraop 02/08/2013 - 2 units 02/26/2013 - 2 units  Consults: Infectious Disease, Dr. Lady Gary  HPI: The patient is a 79 year old female who comes in for a irrigation and debridement of the right total knee arthroplasty to be performed by Dr. Gus Rankin. Aluisio, MD at [facility] on 01/28/2013.  The patient is a 79 year old female who is now out from her right total knee arthroplasty from July 14th earlier this year which was complicated by a postop hematoma that required I&D three days later on July 17th. She went to [facility] and was followed postoperatively. She developed postop temps and wound drainage which did improve initially by her three week check up. By five weeks, an area opened up and drained again and was treated with oral antibiotics and then placed on wet to dry dressings for the small wound area. By nine weeks, the area had not improved and actually became larger causing her to require a repeat I&D of the knee on 9/17 with placement of a wound VAC. She was seen on a regular basis in the office and had regular VAC changes with home health and had continued improvement of the wound  and wound shrinkage was noted with each visit. Due to improvement, the VAC was discontinue on 01/15/2013.  Unfortunately, once the [facility] was discontinued and she was started on wet to dry dressing changes, the wound started to worsen and deepen. She was referred to the [facility] in [place], [place] near her home.  She was seen at the wound care and developed bloody, purulent drainage. She was seen back in the office with a worsening wound dehiscence and was scheduled for repeat I&D of the right knee wound infection.  They have been treated conservatively for the above stated problem and despite conservative measures, they continue  to have progressive woun d issuesand severe functional limitations and dysfunction. They have failed non-operative management including VAC dressings, oral antibiotics, wet to dry dressings, and wound care center treatment. It is felt that they would benefit from undergoing I&D of the right total joint replacement. Risks and benefits of the procedure have been discussed with the patient and they elect to proceed with surgery.   Laboratory Data: Admission on 01/28/2013  No results displayed because visit has over 200 results.    Admission on 01/26/2013, Discharged on 01/26/2013  Component Date Value Range Status  . WBC 01/26/2013 18.9* 4.0 - 10.5 K/uL Final  . RBC 01/26/2013 3.40* 3.87 - 5.11 MIL/uL Final  . Hemoglobin 01/26/2013 9.1* 12.0 - 15.0 g/dL Final  . HCT 29/56/2130 28.3* 36.0 - 46.0 % Final  . MCV 01/26/2013 83.2  78.0 - 100.0 fL Final  . MCH 01/26/2013 26.8  26.0 - 34.0 pg Final  . MCHC 01/26/2013 32.2  30.0 - 36.0 g/dL Final  . RDW 86/57/8469 17.1* 11.5 - 15.5 % Final  . Platelets 01/26/2013 379  150 - 400 K/uL Final  . Sodium 01/26/2013 129* 135 - 145 mEq/L Final  . Potassium 01/26/2013 3.8  3.5 - 5.1 mEq/L Final  . Chloride 01/26/2013 95* 96 - 112 mEq/L Final  . CO2 01/26/2013 25  19 - 32 mEq/L Final  . Glucose, Bld 01/26/2013 119* 70 - 99 mg/dL Final  . BUN 62/95/2841 11  6 - 23 mg/dL Final  . Creatinine, Ser 01/26/2013 0.57  0.50 - 1.10 mg/dL Final  . Calcium 32/44/0102 9.1  8.4 - 10.5 mg/dL Final  . GFR calc non Af Amer 01/26/2013 85* >90 mL/min Final  . GFR calc Af Amer 01/26/2013 >90  >90 mL/min Final   Comment: (NOTE)                          The eGFR has been calculated using the CKD EPI equation.                          This calculation has not been validated in all clinical situations.                          eGFR's persistently <90 mL/min signify possible Chronic Kidney                          Disease.  [name] Prothrombin Time 01/26/2013 15.2  11.6 - 15.2 seconds  Final  . INR 01/26/2013 1.23  0.00 - 1.49 Final  . Color, Urine 01/26/2013 YELLOW  YELLOW Final  . APPearance 01/26/2013 CLOUDY* CLEAR Final  . Specific Gravity, Urine 01/26/2013 1.017  1.005 - 1.030 Final  . pH 01/26/2013  6.5  5.0 - 8.0 Final  . Glucose, UA 01/26/2013 NEGATIVE  NEGATIVE mg/dL Final  . Hgb urine dipstick 01/26/2013 TRACE* NEGATIVE Final  . Bilirubin Urine 01/26/2013 NEGATIVE  NEGATIVE Final  . Ketones, ur 01/26/2013 NEGATIVE  NEGATIVE mg/dL Final  . Protein, ur 16/12/9602 NEGATIVE  NEGATIVE mg/dL Final  . Urobilinogen, UA 01/26/2013 0.2  0.0 - 1.0 mg/dL Final  . Nitrite 54/11/8117 NEGATIVE  NEGATIVE Final  . Leukocytes, UA 01/26/2013 TRACE* NEGATIVE Final  . Squamous Epithelial / LPF 01/26/2013 FEW* RARE Final  . WBC, UA 01/26/2013 0-2  <3 WBC/hpf Final  . RBC / HPF 01/26/2013 0-2  <3 RBC/hpf Final  . Bacteria, UA 01/26/2013 FEW* RARE Final  Hospital Outpatient Visit on 01/25/2013  Component Date Value Range Status  . MRSA, PCR 01/25/2013 NEGATIVE  NEGATIVE Final  . Staphylococcus aureus 01/25/2013 NEGATIVE  NEGATIVE Final   Comment:                                 The Xpert SA Assay (FDA                          approved for NASAL specimens                          in patients over 109 years of age),                          is one component of                          a comprehensive surveillance                          program.  Test performance has                          been validated by [employer] for patients greater                          than or equal to 32 year old.                          It is not intended                          to diagnose infection nor to                          guide or monitor treatment.  . WBC 01/25/2013 15.3* 4.0 - 10.5 K/uL Final  . RBC 01/25/2013 3.42* 3.87 - 5.11 MIL/uL Final  . Hemoglobin 01/25/2013 9.1* 12.0 - 15.0 g/dL Final  . HCT 14/78/2956 28.8* 36.0 - 46.0 % Final  . MCV 01/25/2013  84.2  78.0 - 100.0 fL Final  . MCH 01/25/2013 26.6  26.0 - 34.0 pg Final  . MCHC 01/25/2013 31.6  30.0 - 36.0 g/dL Final  . RDW 21/30/8657 17.2* 11.5 - 15.5 %  Final  . Platelets 01/25/2013 430* 150 - 400 K/uL Final  . Sodium 01/25/2013 126* 135 - 145 mEq/L Final  . Potassium 01/25/2013 4.4  3.5 - 5.1 mEq/L Final  . Chloride 01/25/2013 93* 96 - 112 mEq/L Final  . CO2 01/25/2013 25  19 - 32 mEq/L Final  . Glucose, Bld 01/25/2013 121* 70 - 99 mg/dL Final  . BUN 45/40/9811 16  6 - 23 mg/dL Final  . Creatinine, Ser 01/25/2013 0.74  0.50 - 1.10 mg/dL Final  . Calcium 91/47/8295 9.1  8.4 - 10.5 mg/dL Final  . GFR calc non Af Amer 01/25/2013 78* >90 mL/min Final  . GFR calc Af Amer 01/25/2013 >90  >90 mL/min Final   Comment: (NOTE)                          The eGFR has been calculated using the CKD EPI equation.                          This calculation has not been validated in all clinical situations.                          eGFR's persistently <90 mL/min signify possible Chronic Kidney                          Disease.  [name] aPTT 01/25/2013 37  24 - 37 seconds Final   Comment:                                 IF BASELINE aPTT IS ELEVATED,                          SUGGEST PATIENT RISK ASSESSMENT                          BE USED TO DETERMINE APPROPRIATE                          ANTICOAGULANT THERAPY.  . Prothrombin Time 01/25/2013 15.5* 11.6 - 15.2 seconds Final  . INR 01/25/2013 1.26  0.00 - 1.49 Final     X-Rays:Dg Cervical Spine With Flex & Extend  02/11/2013   CLINICAL DATA:  Neck pain.  History of a C5 fracture.  EXAM: CERVICAL SPINE COMPLETE WITH FLEXION AND EXTENSION VIEWS  COMPARISON:  Cervical CT, 01/26/2013  FINDINGS: No fracture. Slight anterolisthesis is noted of C7 on T1. No other spondylolisthesis. There is a reversal of the normal cervical lordosis centered at C3-C4.  Moderate loss of disk height is noted at C3-C4, C4-C5 and C5-C6 with mild loss of disk height at C6-C7. Endplate  spurring is noted at these levels.  Flexion and extension views show no subluxation.  There are facet degenerative changes bilaterally, relatively mild. There are varying degrees of neural foraminal narrowing greatest on the right at C3-C4 and C4-C5 where it is moderate and on the left at C3-C4 where it is severe.  Soft tissues are unremarkable.  IMPRESSION: The fracture of the right C6 articular process is not visualized on standard radiographs.  No acute fracture. No spondylolisthesis with flexion or extension. Degenerative changes as detailed.   Electronically Signed   By:  Amie Portland M.D.   On: 02/11/2013 08:55    EKG: Orders placed during the hospital encounter of 11/26/12  . EKG 12-LEAD  . EKG 12-LEAD  . EKG     Hospital Course: JETTIE MANNOR is a 79 y.o. who was admitted to [facility] They were brought to the operating room on 01/28/2013 and underwent Procedure(s): #1 Right knee resection arthroplasty, antibiotic cement spacer placement.   Patient tolerated the procedure well and was later transferred to the recovery room and then to the orthopaedic floor for postoperative care.  They were given PO and IV analgesics for pain control following their surgery.  They were placed on IV antibiotics.  She was started started on DVT prophylaxis in the form of Aspirin 325 mg daily.  PT and OT were ordered for therapy protocol and allowed to beWeight-Bearing as tolerated to right leg but No Bending or No Motion.  Discharge planning consulted to help with postop disposition and equipment needs.  Patient had a tough night on the evening of surgery with pain.  They started to get up OOB with therapy on day one. Knee immobilizer at all times. Ordered PICC line. Intraoperative Cultures were positive:  MODERATE WBC PRESENT, PREDOMINANTLY PMN NO SQUAMOUS EPITHELIAL CELLS SEEN ABUNDANT GRAM POSITIVE COCCI IN PAIRS FEW GRAM NEGATIVE RODS I.D. Consulted postop.  Assessment/Plan: 79 yo F with TKA in  July 2014 c/b post op hematoma s/p I x D who has had poor wound healing and dehiscence. Admitted for management of infected prosthetic joint. POD#1 prosthesis resection and antibiotic spacer placement  - also start cefepime in addition to vancomycin for Gram negative coverage given gram stain findings  - place picc line in anticipation to receive at least 6 wks of IV antibiotics  - await culture results in order to determine final antibiotics recommendations  Cynthia B. Snider MD MPH  [facility] HD 2 - continued with antibiotic treatment. HD 3 - Dr. Drue Second changed antibiotics to ceftriaxone 2gm IV daily Assessment/Plan:  79 yo F with TKA in July 2014 c/b post op hematoma s/p I x D who has had poor wound healing and dehiscence. Admitted for management of infected prosthetic joint. POD#2 prosthesis resection and antibiotic spacer placement. OR cx growing group B strep  - Will change antibiotics to ceftriaxone 2gm IV daily  - place picc line in anticipation to receive at least 6 wks of IV antibiotics  HD 4 - slowly improving.  HGB stable at 8.9. Continue with ceftriaxone 2gm IV daily. PICC line for probably 6 weeks of IV antibiotic therapy.  Weekend HD 5 & 6 - Transfused 2 units PRBCs due to symptomatic post-op anemia Saturday 02/02/2013 and had nursing staff removed sutures from forehead on Sunday. HD 7 - Dehiscence of wound in the middle portion of the incision. Dr. Lequita Halt able to express large amount of hematoma. Continue with ceftriaxone 2gm IV daily for 6 wks. HD 8 - Patient was having problems with continued drainage from open wound.  HGB up to 10.3 after blood but back down to 8.5 today. Typed and cross for blood. Felt likely need more blood with surgery anticipated the following day. HD 9 - #2 - 02/06/2013 - Irrigation and debridement of right knee. HD 10 (02/07/13) - Patient reported pain as mild and moderate following second surgery.  HGB was down to 8.5 prior to  second surgery but typed and crossed and given blood with procedure #2. HGB was 9.5 postop.  HD 11 - Patient seen in rounds with Dr. Lequita Halt. She was feeling a little better that day but HGB was low again down to 7.4. Gave blood again with 2 units. Did have some thick purulent drainage from the middle incision which was expressed out, questionable old hematoma still or from the gel foam? placed at time of surgery. Drains were sewn in. Still patent. Left in place. Weekend HD 12 & 13 - Wound checked daily by weekend rounding coverage.  Dressings taken down on Saturday, copious amount of bloody drainage found on dressings and around incision site. Incision site is deeply erythematous, edematous, and draining. Sutures and staples in place. And Sunday continued to have blood tinged and serous drainage from wound. HD 14 (02/11/2013) - Incision was clean but still with some scant bleeding from incision but much less than the week before.  Continued with ceftriaxone 2gm IV daily for 6 weeks are per Dr. Drue Second.  HD 15 - Dressing changed and small area of wound opened up in the middle portion of incision. No active bleeding. No wound necrosis noted. Changed to wet to dry dressing changes today. Twice A Day wet to dry dressings at that time. Dr. Lequita Halt switched the patient out from the hard collar which she was wearing from a previous injury to a soft collar. Wear at all times except may lossen up a little while in bed supported. HD 16 (02/13/2013) - continued to follow the wound with wet to dry dressing changes. HD 17 - Doing "fair" today. Trying to get rest. She will likely need further surgery. Plan to recheck on rounds the next day and decide at that point of surgical plans. Possibly more surgery the next evening but would reassess the wound in the morning. Plan to obtain new cultures if goes back to surgery. HD 18 (02/15/2013) - #3 Irrigation and debridement of right knee with exchange of antibiotic spacer.  HD 19  - Patient reports pain as 3 on 0-10 scale. No major issues this morning. Afebrile. Hbg8.7 HD 20 - Dressing/Incision - clean, slight drainage, incision holding, knee looks good. Continue with ceftriaxone 2gm IV daily for 6 weeks are per Dr. Drue Second.  New cultures taken at time of repeat surgery.  HD 21 - Continue with ceftriaxone 2gm IV daily for 6 weeks are per Dr. Drue Second.  HD 22 - Recent new culture shows Gram Negative Rods - FEW ENTEROCOCCUS SPECIES. Weight Bearing Status has Changed to TDWB ONLY to right leg but No Bending or No Motion. Incision looks much better following most recent I&D procedure.  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  - will switch antibiotics to vancomycin with goal trough of 15-20. Will consult pharmacy to dose and schedule HD 22 (02/20/2013) - She had a bowel movement yesterday but developed diarrhea today. Monitor and if continues then check for c.diff.  HGB stable at 7.8 from yesterday. Patient is asymptomatic at this time. Switched antibiotics to vancomycin with goal trough of 15-20. Dr. Drue Second consulted pharmacy to dose and schedule. HD 23 - Was out of room in hallway for first time today.  WBC normal for first time in months!!!! Possible SNF early next week as long as wound continues to improve. HD 24 - Encouraging that WBC has normalized and continues to trend downward. Possible SNF early next week if wound continues to stabilize/improve.  Patient is beginning to feel better overall. Appetite somewhat improved. Weekend HD 25 & 26 - Pt sitting up comfortably in good spirits this weekend. Continue with daily dressing changes. 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  - continue  with vancomycin with goal trough of 15-20. Thus far, no impact on cr clearance  - will treat for total of 6 wks, using 11/25 as start date.  - if she goes to OR for further debridement, please repeat cultures.  - will sign off and have her follow up in 2-4 wks.  [facility], [facility] HD 27 (02/25/2013) - To surgery today for I & D ;removal of spacer and closure over drains. #4 Irrigation and debridement of right knee and exchange of antibiotic spacer. HD 28 - Patient was well, but has had some minor complaints of pain in the knee, requiring pain medications  Started therapy today. She can only be TDWB to that leg. HGB was lower today at 7.0. Gave blood today and recheck labs in the morning. Continue with IV vancomycin via PICC line for IV antibiotic therapy.  Group B Sttrep infection  Eterococcus infection HD 29 - HGB low at 7.0 yesterday. Gave two units and HGB up to 9.1. Continue with IV vancomycin.  Patient reports pain as mild and moderate. Patient limited by fatigue;Patient limited by pain HD 30 - She is feeling a little better today as compared to yesterday.  HGB low at 7.0 two days ago. Gave two units and HGB up to 9.1. Stable again today. HD 31 (03/01/2013) - Plan was to go Skilled nursing facility after hospital stay. Her wound was stable and if continued to remain that way over the weekend, she will possibly be ready for SNF on Monday.  Possible transfer to SNF on Monday if her wound continues to remain stable. Weekend HD 32 & 33 - Patient reports pain as mild, controlled with medication. No events throughout the night.  Drainage appears to be bloody and originating from an area above the main incision as well a a spot in the middle of the main incision. The rest of the incision appears to be well approximated and no drainage. Dressing changed with 4x4 guaze, ABD and ACE bandage. On Sunday, able to transfer to chair and commode with no complication.    Diet:  Cardiac diet Activity:  Touch Down Weight Bearing ONLY and No Bending or No Motion  Follow-up: on Thursday 03/07/2013 with Dr. Lequita Halt Follow up with Dr. Drue Second in approx 3 weeks [facility] Continue with IV vancomycin for six weeks. Changed to Vanc on 02/16/2013 and will continue until January 2nd for a total of six weeks of Vanc. PICC line for IV antibiotic therapy.  Weekly CBC and BMETs at the SNF.  Send results to Dr. Feliz Beam office at [facility] for [facility] for monitoring of Vanc IV ABX. Disposition - Skilled nursing facility - [facility] Discharged Condition: stable        Future Appointments Provider Department Dept Phone   03/12/2013 9:00 AM Judyann Munson, MD [facility] [phone]     Medication List     START taking these medications      Next dose due Provider   acetaminophen 325 MG tablet  Kolbie Clarkston Julien Girt  Commonly known as: TYLENOL     Take 2 tablets (650 mg total) by mouth every 6 (six) hours as needed for mild pain (or Fever >/= 101).          bisacodyl 10 MG suppository  Omarr Hann   Commonly known as: DULCOLAX     Place 1 suppository (10 mg total) rectally daily as needed for moderate constipation.          feeding supplement (ENSURE COMPLETE) Liqd  Valjean Ruppel   Take 237 mLs by mouth 3 (three) times daily with meals.          metoCLOPramide 5 MG tablet  Kaelynne Christley   Commonly known as: REGLAN     Take 1-2 tablets (5-10 mg total) by mouth every 8 (eight) hours as needed for nausea (if ondansetron (ZOFRAN) ineffective.).          polyethylene glycol packet  Chynah Orihuela   Commonly known as: MIRALAX / GLYCOLAX     Take 17 g by mouth daily as needed for mild constipation.          Vancomycin 750 MG/150ML Soln  Maleta Pacha   Commonly known as: VANCOCIN     Inject 150 mLs (750 mg total) into the vein every 12  (twelve) hours. IV Vancomycin for 50 more doses to complete a six week course of IV ABX via PICC Line.                         CHANGE how you take these medications      Next dose due Provider   HYDROcodone-acetaminophen 5-325 MG per tablet  Sheketa Ende   Commonly known as: NORCO/VICODIN     Take 1-2 tablets by mouth every 4 (four) hours as needed (moderate to severe pain).     What changed: Another medication with the same name was removed. Continue taking this medication, and follow the directions you see here.          traMADol 50 MG tablet  Kostas Marrow   Commonly known as: ULTRAM     Take 1 tablet (50 mg total) by mouth every 6 (six) hours as needed (mild pain).     What changed: reasons to take this                            Medication List    ASK your doctor about these medications       ALPRAZolam 0.5 MG tablet  Commonly known as:  XANAX  Take 0.5-1 mg by mouth 2 (two) times daily. Take one tablet in the morning and two tablets in the evening.     aspirin EC 81 MG tablet  Take 81 mg by mouth every morning.     docusate sodium 100 MG capsule  Commonly known as:  COLACE  Take 100 mg by mouth every morning.     doxycycline 100 MG capsule  Commonly known as:  VIBRAMYCIN  Take 100 mg by mouth 2 (two) times daily. She is taking for 14 days. Her pharmacy filled this for her on 01/23/13. She has not completed this regimen.     esomeprazole 40 MG capsule  Commonly known as:  NEXIUM  Take 40 mg by mouth every morning.     ferrous sulfate 325 (65 FE) MG tablet  Take 325 mg by mouth 2 (two) times daily.     gabapentin 300  MG capsule  Commonly known as:  NEURONTIN  Take 300 mg by mouth 2 (two) times daily.     HYDROcodone-acetaminophen 5-325 MG per tablet  Commonly known as:  NORCO/VICODIN  Take 1-2 tablets by mouth every 4 (four) hours as needed (moderate to severe pain).     HYDROcodone-acetaminophen 5-325 MG per tablet  Commonly known as:   NORCO/VICODIN  Take 1 tablet by mouth every 6 (six) hours as needed for pain.     levothyroxine 150 MCG tablet  Commonly known as:  SYNTHROID, LEVOTHROID  Take 1 tablet (150 mcg total) by mouth daily before breakfast.     methocarbamol 500 MG tablet  Commonly known as:  ROBAXIN  Take 500 mg by mouth every 6 (six) hours as needed (For muscle spasms.).     ondansetron 4 MG tablet  Commonly known as:  ZOFRAN  Take 1 tablet (4 mg total) by mouth every 6 (six) hours as needed for nausea.     promethazine 25 MG tablet  Commonly known as:  PHENERGAN  Take 0.5-1 tablets (12.5-25 mg total) by mouth every 6 (six) hours as needed for nausea.     simvastatin 20 MG tablet  Commonly known as:  ZOCOR  Take 20 mg by mouth every morning.     tolterodine 4 MG 24 hr capsule  Commonly known as:  DETROL LA  Take 4 mg by mouth every morning.     traMADol 50 MG tablet  Commonly known as:  ULTRAM  Take 1 tablet (50 mg total) by mouth every 6 (six) hours as needed for pain (mild to moderate pain).         SignedPatrica Duel 03/01/2013, 8:13 AM

## 2013-03-01 NOTE — Progress Notes (Addendum)
Subjective: 4 Days Post-Op Procedure(s) (LRB): IRRIGATION AND DEBRIDIMENT  RIGHT KNEE AND REMOVAL AND REPLACEMENT OF ANTIBIOTIC SPACERS RIGHT KNEE (Right) Patient reports pain as severe last night but better today. Patient seen in rounds with Dr. Lequita Halt. Patient is well, but has had some minor complaints of pain in the knee, requiring pain medications Plan is to go Skilled nursing facility after hospital stay.  Her wound is stable and if continues to remain that way over the weekend, she will possibly be ready for SNF on Monday.  Objective: Vital signs in last 24 hours: Temp:  [98.4 F (36.9 C)-98.9 F (37.2 C)] 98.4 F (36.9 C) (12/05 0647) Pulse Rate:  [65-74] 65 (12/05 0647) Resp:  [14-16] 16 (12/05 0647) BP: (130)/(66-69) 130/69 mmHg (12/05 0647) SpO2:  [94 %-96 %] 96 % (12/05 0647)  Intake/Output from previous day:  Intake/Output Summary (Last 24 hours) at 03/01/13 0739 Last data filed at 03/01/13 0200  Gross per 24 hour  Intake   1405 ml  Output   3201 ml  Net  -1796 ml    Intake/Output this shift:    Labs:  Recent Labs  02/27/13 0545 02/28/13 0506 03/01/13 0556  HGB 9.1* 9.1* 9.5*    Recent Labs  02/28/13 0506 03/01/13 0556  WBC 14.0* 11.5*  RBC 3.12* 3.21*  HCT 28.4* 29.5*  PLT 353 422*    Recent Labs  02/28/13 0506 03/01/13 0556  NA 129* 132*  K 4.3 4.0  CL 94* 95*  CO2 29 29  BUN 9 8  CREATININE 0.60 0.58  GLUCOSE 101* 82  CALCIUM 8.2* 8.4   No results found for this basename: LABPT, INR,  in the last 72 hours  EXAM General - Patient is Alert, Appropriate and Oriented Extremity - Neurovascular intact Sensation intact distally Dorsiflexion/Plantar flexion intact Dressing/Incision - clean, dry, scant drainage from the previous HV site.  Incision and previous dehisced area are stable this morning.  Motor Function - intact, moving foot and toes well on exam.   Past Medical History  Diagnosis Date  . Mixed hyperlipidemia   .  Hypothyroidism   . GERD (gastroesophageal reflux disease)   . Atrial fibrillation     Remote amiodarone use, recurred in 07/2011 post op from knee arthroplasty  . Obese   . History of blood transfusion 08/31/2011    A+ type  . History of GI bleed     Gastritis, was on Xarelto and ASA  . Carpal tunnel syndrome   . Arthritis     Knees  . DJD (degenerative joint disease), thoracolumbar   . Rheumatoid arthritis   . H/O bronchitis   . Hemorrhoids   . Measles     as child  . Mumps     as child  . Carotid artery disease   . PONV (postoperative nausea and vomiting)   . Anemia   . Difficulty sleeping   . Multiple bruises     DUE TO FALL 01/24/13    Assessment/Plan: 4 Days Post-Op Procedure(s) (LRB): IRRIGATION AND DEBRIDIMENT  RIGHT KNEE AND REMOVAL AND REPLACEMENT OF ANTIBIOTIC SPACERS RIGHT KNEE (Right) Active Problems:   Essential hypertension, benign   Hyponatremia   Postoperative anemia due to acute blood loss   Postop Transfusion   Surgical wound infection   Leukocytosis   Hypothyroidism   Necrosis of surgical wound   Acquired absence of knee joint following removal of joint prosthesis with presence of antibiotic-impregnated cement spacer   Group B streptococcal infection  Protein-calorie malnutrition, severe   Severe malnutrition  Estimated body mass index is 24.68 kg/(m^2) as calculated from the following:   Height as of this encounter: 5' 5.35" (1.66 m).   Weight as of this encounter: 68 kg (149 lb 14.6 oz). Up with therapy Continue ABX therapy due to infection  DVT Prophylaxis - SCD's  Touch Down Weight Bearing ONLY and No Bending or No Motion  WBC back down to 11.5 Na+ back up to 132  HGB low at 7.0 three days ago. Gave two units and HGB up to 9.1. Stable again today at 9.5  Continue with IV vancomycin  PICC line for IV antibiotic therapy.  Group B Sttrep infection  Eterococcus infection  Dr. Lequita Halt switched the patient out from the hard collar to a soft  collar. Wear at all times except may lossen up a little while in bed supported.   Possible transfer to SNF on Monday if her wound continues to remain stable.  Dr. Deri Fuelling partners will be coming by over the weekend to check the wound daily for close observation and to ensure continued wound healing and no further changes or complications. They will be available for any questions or concerns.  Dr. Lequita Halt will return Monday to reassess the patient and determine if she is stable enough to be released to the SNF at that time.  PERKINS, ALEXZANDREW 03/01/2013, 7:39 AM

## 2013-03-01 NOTE — Progress Notes (Signed)
Physical Therapy Treatment Patient Details Name: [name] [name] MRN: [id number] DOB: May 12, 1930 Today's Date: 03/01/2013 Time: 8413-2440 PT Time Calculation (min): 40 min  PT Assessment / Plan / Recommendation  History of Present Illness 79 yo female s/p R knee I&D, resection, antibiotic spacer placed. Recent hx of fall (11/1) resulting in multiple cervical fractures requires soft collar at all times. S/p I/D and rplecement of antibiotic spacer on 02/25/13.   PT Comments     Follow Up Recommendations  SNF     Does the patient have the potential to tolerate intense rehabilitation     Barriers to Discharge        Equipment Recommendations  Rolling walker with 5" wheels;Wheelchair (measurements PT)    Recommendations for Other Services OT consult  Frequency Min 3X/week   Progress towards PT Goals Progress towards PT goals: Progressing toward goals  Plan Current plan remains appropriate    Precautions / Restrictions Precautions Precautions: Fall;Cervical Precaution Comments: ki at all times. Required Braces or Orthoses: Knee Immobilizer - Right Knee Immobilizer - Right: On at all times Restrictions Weight Bearing Restrictions: Yes RLE Weight Bearing: Touchdown weight bearing Other Position/Activity Restrictions: Pt instructed on TTWB however demonstarted difficulty maintaining such restrictiion   Pertinent Vitals/Pain 8/10; premed, RN aware, ice pack provided    Mobility  Bed Mobility Bed Mobility: Supine to Sit;Sit to Supine Supine to Sit: 4: Min assist Sit to Supine: 4: Min assist Details for Bed Mobility Assistance: Assist with R LE only and cues for sequencing and use of  L LE and bed rail to self assist Transfers Transfers: Sit to Stand;Stand to Sit;Stand Pivot Transfers Sit to Stand: 3: Mod assist;From bed;From chair/3-in-1;With upper extremity assist Stand to Sit: 4: Min assist;3: Mod assist;With upper extremity assist;To bed;To chair/3-in-1 Stand Pivot Transfers: 3:  Mod assist Details for Transfer Assistance: verbal cues for LE and UE placement including keeping R LE forward and TDWB only as well as reaching for armrests with stand pivots, extra time to balance and scoot on L foot to turn. Ambulation/Gait Ambulation/Gait Assistance: Not tested (comment) Assistive device: Photographer Mobility: Yes Wheelchair Assistance: 4: Min Visual merchandiser Details (indicate cue type and reason): Increased time for propulsion - pt educated on longer strokes to increase distance covered and speed as well as decreasing fatigue.  Pt educated on control in turns with pulling individual wheels fwd/bk Wheelchair Propulsion: Both upper extremities Wheelchair Parts Management: Needs assistance Distance: 270    Exercises     PT Diagnosis:    PT Problem List:   PT Treatment Interventions:     PT Goals (current goals can now be found in the care plan section) Acute Rehab PT Goals Patient Stated Goal: regain independence and decreased pain PT Goal Formulation: With patient Time For Goal Achievement: 02/28/13 Potential to Achieve Goals: Good  Visit Information  Last PT Received On: 03/01/13 Assistance Needed: +1 History of Present Illness: 79 yo female s/p R knee I&D, resection, antibiotic spacer placed. Recent hx of fall (11/1) resulting in multiple cervical fractures requires soft collar at all times. S/p I/D and rplecement of antibiotic spacer on 02/25/13.    Subjective Data  Subjective: I want to get out of this room Patient Stated Goal: regain independence and decreased pain   Cognition  Cognition Arousal/Alertness: Awake/alert Behavior During Therapy: WFL for tasks assessed/performed Overall Cognitive Status: Within Functional Limits for tasks assessed    Balance     End of Session PT -  End of Session Equipment Utilized During Treatment: Gait belt;Right knee immobilizer Activity Tolerance: Patient tolerated  treatment well Patient left: in bed;with call bell/phone within reach Nurse Communication: Mobility status   GP     [name] [name] 03/01/2013, 5:41 PM

## 2013-03-01 NOTE — Progress Notes (Signed)
CSW spoke with Admissions at [facility]. SNF bed will be available at [facility] on Monday if pt is stable for d/c.  Cori Razor LCSW [phone]

## 2013-03-02 LAB — BASIC METABOLIC PANEL
BUN: 8 mg/dL (ref 6–23)
Chloride: 95 mEq/L — ABNORMAL LOW (ref 96–112)
Creatinine, Ser: 0.57 mg/dL (ref 0.50–1.10)
GFR calc Af Amer: >90 mL/min (ref >90)
GFR calc non Af Amer: 85 mL/min — ABNORMAL LOW (ref >90)
Potassium: 3.9 mEq/L (ref 3.5–5.1)
Sodium: 131 mEq/L — ABNORMAL LOW (ref 135–145)

## 2013-03-02 NOTE — Progress Notes (Signed)
Patient shared that she spends a lot of time outside. Patient became very tearful about the amount of time she has been in the hospital and the pain she has been experiencing. She could benefit from re-evaluation of pain management and change of scenery if possible.

## 2013-03-02 NOTE — Progress Notes (Signed)
   Subjective: 5 Days Post-Op Procedure(s) (LRB): IRRIGATION AND DEBRIDIMENT  RIGHT KNEE AND REMOVAL AND REPLACEMENT OF ANTIBIOTIC SPACERS RIGHT KNEE (Right)   Patient reports pain as mild, controlled with medication. No events throughout the night.  Objective:   VITALS:   Filed Vitals:   03/02/13 0450  BP: 160/78  Pulse: 74  Temp: 98.3 F (36.8 C)  Resp: 16    Neurovascular intact Dorsiflexion/Plantar flexion intact Incision: scant drainage Drainage appears to be bloody and originating from an area above the main incision as well a a spot in the middle of the main incision. The rest of the incision appears to be well approximated and no drainage.  LABS  Recent Labs  02/28/13 0506 03/01/13 0556  HGB 9.1* 9.5*  HCT 28.4* 29.5*  WBC 14.0* 11.5*  PLT 353 422*     Recent Labs  02/28/13 0506 03/01/13 0556 03/02/13 0440  NA 129* 132* 131*  K 4.3 4.0 3.9  BUN 9 8 8   CREATININE 0.60 0.58 0.57  GLUCOSE 101* 82 83     Assessment/Plan: 5 Days Post-Op Procedure(s) (LRB): IRRIGATION AND DEBRIDIMENT  RIGHT KNEE AND REMOVAL AND REPLACEMENT OF ANTIBIOTIC SPACERS RIGHT KNEE (Right)  Up with therapy Discharge to SNF Dressing changed with 4x4 guaze, ABD and ACE bandage Knee immobilizer was place back on the knee.   [name] [name]   PAC  03/02/2013, 8:45 AM

## 2013-03-03 LAB — BASIC METABOLIC PANEL
CO2: 26 mEq/L (ref 19–32)
Chloride: 93 mEq/L — ABNORMAL LOW (ref 96–112)
Creatinine, Ser: 0.54 mg/dL (ref 0.50–1.10)
GFR calc Af Amer: >90 mL/min (ref >90)
Potassium: 4.1 mEq/L (ref 3.5–5.1)
Sodium: 128 mEq/L — ABNORMAL LOW (ref 135–145)

## 2013-03-03 LAB — VANCOMYCIN, TROUGH: Vancomycin Tr: 17.3 ug/mL (ref 10.0–20.0)

## 2013-03-03 NOTE — Progress Notes (Signed)
ANTIBIOTIC CONSULT NOTE - FOLLOW UP  Pharmacy Consult for Vancomycin Indication: prosthetic joint infection  Allergies  Allergen Reactions  . Omeprazole Nausea And Vomiting  . Penicillins Itching  . Morphine Rash    And hot flashes  . Xarelto [Rivaroxaban] Other (See Comments)    Bleeding,spitting up blood    Patient Measurements: Height: 5' 5.35" (166 cm) (Documented in chart 01/25/13) Weight: 149 lb 14.6 oz (68 kg) (Documented in chart 01/25/13) IBW/kg (Calculated) : 57.81 Adjusted Body Weight:   Vital Signs: Temp: 98.6 F (37 C) (12/07 0620) Temp src: Oral (12/07 0620) BP: 144/69 mmHg (12/07 0620) Pulse Rate: 92 (12/07 0620) Intake/Output from previous day: 12/06 0701 - 12/07 0700 In: 731.3 [P.O.:480; I.V.:251.3] Out: 1450 [Urine:1450] Intake/Output from this shift:    Labs:  Recent Labs  03/01/13 0556 03/02/13 0440 03/03/13 0935  WBC 11.5*  --   --   HGB 9.5*  --   --   PLT 422*  --   --   CREATININE 0.58 0.57 0.54   Estimated Creatinine Clearance: 51.2 ml/min (by C-G formula based on Cr of 0.54).  Recent Labs  03/03/13 0935  VANCOTROUGH 17.3     Microbiology: Recent Results (from the past 720 hour(s))  CULTURE, BLOOD (ROUTINE X 2)     Status: None   Collection Time    02/05/13  6:15 PM      Result Value Range Status   Specimen Description BLOOD LEFT HAND   Final   Special Requests BOTTLES DRAWN AEROBIC AND ANAEROBIC 10CC   Final   Culture  Setup Time     Final   Value: 02/05/2013 22:14     Performed at [facility]   Culture     Final   Value: NO GROWTH 5 DAYS     Performed at [facility]   Report Status 02/11/2013 FINAL   Final  CULTURE, BLOOD (ROUTINE X 2)     Status: None   Collection Time    02/05/13  6:45 PM      Result Value Range Status   Specimen Description BLOOD LEFT ARM   Final   Special Requests BOTTLES DRAWN AEROBIC AND ANAEROBIC 10CC   Final   Culture  Setup Time     Final   Value: 02/05/2013 22:15   Performed at [facility]   Culture     Final   Value: NO GROWTH 5 DAYS     Performed at [facility]   Report Status 02/11/2013 FINAL   Final  URINE CULTURE     Status: None   Collection Time    02/05/13  8:01 PM      Result Value Range Status   Specimen Description URINE, RANDOM   Final   Special Requests Normal   Final   Culture  Setup Time     Final   Value: 02/06/2013 01:09     Performed at [facility]   Culture     Final   Value: NO GROWTH     Performed at [facility]   Report Status 02/06/2013 FINAL   Final  CLOSTRIDIUM DIFFICILE BY PCR     Status: None   Collection Time    02/08/13 12:00 AM      Result Value Range Status   C difficile by pcr NEGATIVE  NEGATIVE Final   Comment: Performed at [facility]  WOUND CULTURE     Status: None   Collection Time  02/15/13  6:35 PM      Result Value Range Status   Specimen Description KNEE RIGHT   Final   Special Requests ROCEPHIN   Final   Gram Stain     Final   Value: ABUNDANT WBC PRESENT,BOTH PMN AND MONONUCLEAR     NO SQUAMOUS EPITHELIAL CELLS SEEN     ABUNDANT GRAM NEGATIVE RODS     Performed at [facility]   Culture     Final   Value: FEW ENTEROCOCCUS SPECIES     Performed at [facility]   Report Status 02/19/2013 FINAL   Final   Organism ID, Bacteria ENTEROCOCCUS SPECIES   Final  ANAEROBIC CULTURE     Status: None   Collection Time    02/15/13  6:35 PM      Result Value Range Status   Specimen Description KNEE RIGHT   Final   Special Requests ROCEPHIN   Final   Gram Stain     Final   Value: ABUNDANT WBC PRESENT,BOTH PMN AND MONONUCLEAR     NO SQUAMOUS EPITHELIAL CELLS SEEN     ABUNDANT GRAM NEGATIVE RODS     Performed at [facility]   Culture     Final   Value: MODERATE PREVOTELLA MELANINOGENICA     Note: BETA LACTAMASE POSITIVE     Performed at [facility]   Report Status 02/22/2013 FINAL   Final    Anti-infectives   Start      Dose/Rate Route Frequency Ordered Stop   02/25/13 1936  vancomycin (VANCOCIN) powder  Status:  Discontinued       As needed 02/25/13 1936 02/25/13 2009   02/25/13 1000  vancomycin (VANCOCIN) IVPB 750 mg/150 ml premix     750 mg 150 mL/hr over 60 Minutes Intravenous Every 12 hours 02/25/13 0628     02/25/13 0630  vancomycin (VANCOCIN) IVPB 750 mg/150 ml premix  Status:  Discontinued     750 mg 150 mL/hr over 60 Minutes Intravenous  Once 02/25/13 0627 02/25/13 0850   02/22/13 0745  vancomycin (VANCOCIN) 500 mg in sodium chloride 0.9 % 100 mL IVPB  Status:  Discontinued     500 mg 100 mL/hr over 60 Minutes Intravenous Every 12 hours 02/22/13 0743 02/25/13 0628   02/20/13 0800  vancomycin (VANCOCIN) IVPB 750 mg/150 ml premix  Status:  Discontinued     750 mg 150 mL/hr over 60 Minutes Intravenous Every 12 hours 02/19/13 1926 02/22/13 0743   02/19/13 2030  vancomycin (VANCOCIN) 1,500 mg in sodium chloride 0.9 % 500 mL IVPB     1,500 mg 250 mL/hr over 120 Minutes Intravenous  Once 02/19/13 1926 02/19/13 2227   02/17/13 1400  metroNIDAZOLE (FLAGYL) tablet 500 mg  Status:  Discontinued     500 mg Oral 3 times per day 02/17/13 1102 02/23/13 1149   01/30/13 1600  cefTRIAXone (ROCEPHIN) 2 g in dextrose 5 % 50 mL IVPB  Status:  Discontinued     2 g 100 mL/hr over 30 Minutes Intravenous Every 24 hours 01/30/13 1535 02/19/13 1904   01/29/13 1600  ceFEPIme (MAXIPIME) 1 g in dextrose 5 % 50 mL IVPB  Status:  Discontinued     1 g 100 mL/hr over 30 Minutes Intravenous Every 8 hours 01/29/13 1505 01/30/13 1536   01/29/13 0600  vancomycin (VANCOCIN) IVPB 1000 mg/200 mL premix     1,000 mg 200 mL/hr over 60 Minutes Intravenous On call to O.R. 01/28/13 1424 01/28/13  1750   01/29/13 0600  vancomycin (VANCOCIN) 500 mg in sodium chloride 0.9 % 100 mL IVPB  Status:  Discontinued     500 mg 100 mL/hr over 60 Minutes Intravenous Every 12 hours 01/28/13 2140 01/30/13 1521   01/28/13 1835  vancomycin (VANCOCIN)  powder  Status:  Discontinued       As needed 01/28/13 1835 01/28/13 1911      Assessment: 79 yo F with PJI, now s/p resection of R TKR and placement of antibiotic spacer 11/3. Cultures  grew GBS and bacteroides. Clinical course complicated by wound dehiscence; wound cx grew enterococcus and pt is PCN-allergic. Pt s/p I/D 11/12, 11/21, and 12/1.    11/3 >> Vancomycin >>11/5 11/4 >> Cefepime >> 11/5 11/5 >> Ceftriaxone >> 11/25 11/23 >> Flagyl >> 11/29 11/25 >> vancomycin >>  Tmax: afeb WBCs: 11.5 (12/5) Renal: Scr 0.54 (stable); CrCl 51CG, 63N (execellent UOP)  11/3: intra-op wound cx >> GBS 11/3: anaerboic wound >> Bacteroides species 11/11 blood x 2 >> NGF 11/11 urine >> negative 11/14 C.diff >> negative 11/21 R knee anaerobic >> prevotella melaninogenica, beta-lactamase (+) 11/21 R Knee >> enterococcus sp  12/7: Vancomycin trough this AM therapeutic at 17.3 Noted plans for d/c tomorrow.   Goal of Therapy:  Vancomycin trough level 15-20 mcg/ml  Plan:  Day #13 vancomycin.  Plan cont Vanc x 6 weeks with 11/25 as start date per ID. Continue vancomycin 750mg  IV q12h Need vancomycin trough, BMET weekly  Haynes Hoehn, PharmD, BCPS 03/03/2013, 10:37 AM  Pager: [phone]

## 2013-03-03 NOTE — Progress Notes (Signed)
Subjective: 6 Days Post-Op Procedure(s) (LRB): IRRIGATION AND DEBRIDIMENT  RIGHT KNEE AND REMOVAL AND REPLACEMENT OF ANTIBIOTIC SPACERS RIGHT KNEE (Right) Pt experiencing moderate amount of pain in right knee this AM.  Able to transfer to chair and commode with no complication.  Pt denies N/V/F/C, chest pain, SOB, calf pain, or paresthesia bilaterally.   Objective: Vital signs in last 24 hours: Temp:  [98.2 F (36.8 C)-98.6 F (37 C)] 98.6 F (37 C) (12/07 0620) Pulse Rate:  [75-92] 92 (12/07 0620) Resp:  [16] 16 (12/07 0620) BP: (138-188)/(65-82) 144/69 mmHg (12/07 0620) SpO2:  [94 %-100 %] 94 % (12/07 0620)  Intake/Output from previous day: 12/06 0701 - 12/07 0700 In: 731.3 [P.O.:480; I.V.:251.3] Out: 1450 [Urine:1450] Intake/Output this shift:     Recent Labs  03/01/13 0556  HGB 9.5*    Recent Labs  03/01/13 0556  WBC 11.5*  RBC 3.21*  HCT 29.5*  PLT 422*    Recent Labs  03/02/13 0440 03/03/13 0935  NA 131* 128*  K 3.9 4.1  CL 95* 93*  CO2 29 26  BUN 8 6  CREATININE 0.57 0.54  GLUCOSE 83 122*  CALCIUM 8.6 8.3*   No results found for this basename: LABPT, INR,  in the last 72 hours  Incision is healing, well approximated, staples in good placement.  Bleeding persists from previous drain site just proximal to incsion.  Skin appears erythematous circumferentially about right knee with 1 cm, non-draining, open skin noted at mid incsion.  Distal toes are well perfused, cap refill <5 secs, DP pulses 2+ bilaterally, negative Homan's sign.  strength of ankle dorsiflexion plantarflexion equal bilaterally  Assessment/Plan: 6 Days Post-Op Procedure(s) (LRB): IRRIGATION AND DEBRIDIMENT  RIGHT KNEE AND REMOVAL AND REPLACEMENT OF ANTIBIOTIC SPACERS RIGHT KNEE (Right) Discharge to SNF  Dressing changed with 4x4 guaze, ABD and ACE bandage  Knee immobilizer was place back on the knee.   FLOWERS, CHRISTOPHER S 03/03/2013, 11:22 AM

## 2013-03-04 LAB — BASIC METABOLIC PANEL
CO2: 25 mEq/L (ref 19–32)
Chloride: 95 mEq/L — ABNORMAL LOW (ref 96–112)
Creatinine, Ser: 0.52 mg/dL (ref 0.50–1.10)
GFR calc Af Amer: >90 mL/min (ref >90)
Potassium: 4 mEq/L (ref 3.5–5.1)
Sodium: 132 mEq/L — ABNORMAL LOW (ref 135–145)

## 2013-03-04 MED ORDER — HYDROCODONE-ACETAMINOPHEN 5-325 MG PO TABS: 1.0000 | ORAL_TABLET | ORAL | Status: DC | PRN

## 2013-03-04 MED ORDER — ACETAMINOPHEN 325 MG PO TABS: 650.0000 mg | ORAL_TABLET | Freq: Four times a day (QID) | ORAL | Status: DC | PRN

## 2013-03-04 MED ORDER — METOCLOPRAMIDE HCL 5 MG PO TABS: 5.0000 mg | ORAL_TABLET | Freq: Three times a day (TID) | ORAL | Status: DC | PRN

## 2013-03-04 MED ORDER — VANCOMYCIN HCL IN DEXTROSE 750-5 MG/150ML-% IV SOLN: 750.0000 mg | Freq: Two times a day (BID) | INTRAVENOUS | Status: DC

## 2013-03-04 MED ORDER — TRAMADOL HCL 50 MG PO TABS: 50.0000 mg | ORAL_TABLET | Freq: Four times a day (QID) | ORAL | Status: DC | PRN

## 2013-03-04 MED ORDER — ENSURE COMPLETE PO LIQD: 237.0000 mL | Freq: Three times a day (TID) | ORAL | Status: DC

## 2013-03-04 MED ORDER — BISACODYL 10 MG RE SUPP: 10.0000 mg | Freq: Every day | RECTAL | Status: DC | PRN

## 2013-03-04 MED ORDER — METHOCARBAMOL 500 MG PO TABS: 500.0000 mg | ORAL_TABLET | Freq: Four times a day (QID) | ORAL | Status: DC | PRN

## 2013-03-04 MED ORDER — POLYETHYLENE GLYCOL 3350 17 G PO PACK: 17.0000 g | PACK | Freq: Every day | ORAL | Status: DC | PRN

## 2013-03-04 NOTE — Progress Notes (Signed)
Subjective: 7 Days Post-Op Procedure(s) (LRB): IRRIGATION AND DEBRIDIMENT  RIGHT KNEE AND REMOVAL AND REPLACEMENT OF ANTIBIOTIC SPACERS RIGHT KNEE (Right) Patient reports pain as mild.   Patient seen in rounds by Dr. Lequita Halt. Patient is well, and has had no acute complaints or problems Plan is to go Skilled nursing facility after hospital stay.  Objective: Vital signs in last 24 hours: Temp:  [98 F (36.7 C)-98.2 F (36.8 C)] 98.2 F (36.8 C) (12/08 0603) Pulse Rate:  [69-83] 72 (12/08 0603) Resp:  [14-16] 16 (12/08 0603) BP: (127-161)/(59-68) 160/65 mmHg (12/08 0603) SpO2:  [95 %-97 %] 97 % (12/08 0603)  Intake/Output from previous day:  Intake/Output Summary (Last 24 hours) at 03/04/13 1010 Last data filed at 03/04/13 0810  Gross per 24 hour  Intake 1468.67 ml  Output   2100 ml  Net -631.33 ml    Intake/Output this shift: Total I/O In: 240 [P.O.:240] Out: -   Labs: No results found for this basename: HGB,  in the last 72 hours No results found for this basename: WBC, RBC, HCT, PLT,  in the last 72 hours  Recent Labs  03/03/13 0935 03/04/13 0410  NA 128* 132*  K 4.1 4.0  CL 93* 95*  CO2 26 25  BUN 6 7  CREATININE 0.54 0.52  GLUCOSE 122* 96  CALCIUM 8.3* 8.6   No results found for this basename: LABPT, INR,  in the last 72 hours  EXAM General - Patient is Alert and Appropriate Extremity - Neurovascular intact Sensation intact distally Dressing/Incision - clean, dry Motor Function - intact, moving foot and toes well on exam.   Past Medical History  Diagnosis Date  . Mixed hyperlipidemia   . Hypothyroidism   . GERD (gastroesophageal reflux disease)   . Atrial fibrillation     Remote amiodarone use, recurred in 07/2011 post op from knee arthroplasty  . Obese   . History of blood transfusion 08/31/2011    A+ type  . History of GI bleed     Gastritis, was on Xarelto and ASA  . Carpal tunnel syndrome   . Arthritis     Knees  . DJD (degenerative  joint disease), thoracolumbar   . Rheumatoid arthritis   . H/O bronchitis   . Hemorrhoids   . Measles     as child  . Mumps     as child  . Carotid artery disease   . PONV (postoperative nausea and vomiting)   . Anemia   . Difficulty sleeping   . Multiple bruises     DUE TO FALL 01/24/13    Assessment/Plan: 7 Days Post-Op Procedure(s) (LRB): IRRIGATION AND DEBRIDIMENT  RIGHT KNEE AND REMOVAL AND REPLACEMENT OF ANTIBIOTIC SPACERS RIGHT KNEE (Right) Active Problems:   Essential hypertension, benign   Hyponatremia   Postoperative anemia due to acute blood loss   Postop Transfusion   Surgical wound infection   Leukocytosis   Hypothyroidism   Necrosis of surgical wound   Acquired absence of knee joint following removal of joint prosthesis with presence of antibiotic-impregnated cement spacer   Group B streptococcal infection   Protein-calorie malnutrition, severe   Severe malnutrition  Estimated body mass index is 24.68 kg/(m^2) as calculated from the following:   Height as of this encounter: 5' 5.35" (1.66 m).   Weight as of this encounter: 68 kg (149 lb 14.6 oz). Discharge to SNF  DVT Prophylaxis - SCD's Continue with IV vancomycin  PICC line for IV antibiotic therapy.  Group B Sttrep infection  Eterococcus infection  Touch Down Weight Bearing ONLY and No Bending or No Motion  Disposition - [facility] COD - Stable F/U on this Thursday with Dr. Lequita Halt Follow up with Dr. Drue Second in approx 3 weeks [facility]   PERKINS, ALEXZANDREW 03/04/2013, 10:10 AM

## 2013-03-04 NOTE — Progress Notes (Signed)
PTAR on floor to transport pt to 1535 to [facility].  [facility] called to ask if they would accept patient  Tonight as [name] is available for transport.  Offered to give patient her nighttime medications before transport as [facility] stated that they were unable to have pharmacy dose medications.  [facility] stated they did not have staff nor did they take patient at this time of night.  PTAR made aware as well as [facility] coverage, Georgette Shell RN of situation.  Patient to remain at [facility].  Patient nurse made aware of the above.

## 2013-03-04 NOTE — Discharge Instructions (Signed)
Pick up stool softner and laxative for home. Do not submerge incision under water. May shower. Continue to use ice for pain and swelling from surgery. Hip precautions.  Continue with IV vancomycin  PICC line for IV antibiotic therapy.  Group B Sttrep infection  Eterococcus infection  Touch Down Weight Bearing ONLY and No Bending or No Motion  Disposition - [facility]  COD - Stable  F/U on this Thursday with Dr. Lequita Halt  Follow up with Dr. Drue Second in approx 3 weeks - [facility]

## 2013-03-04 NOTE — Progress Notes (Signed)
Spoke with RN at [facility] regarding patient.  They are unable to take patient tonight, they are unable to obtain her medications since she has not arrived at there facility as of 2015

## 2013-03-05 LAB — BASIC METABOLIC PANEL
CO2: 27 mEq/L (ref 19–32)
Calcium: 9 mg/dL (ref 8.4–10.5)
Chloride: 97 mEq/L (ref 96–112)
Creatinine, Ser: 0.57 mg/dL (ref 0.50–1.10)
GFR calc Af Amer: >90 mL/min (ref >90)
GFR calc non Af Amer: 85 mL/min — ABNORMAL LOW (ref >90)
Glucose, Bld: 83 mg/dL (ref 70–99)
Potassium: 4 mEq/L (ref 3.5–5.1)
Sodium: 133 mEq/L — ABNORMAL LOW (ref 135–145)

## 2013-03-05 MED ORDER — HEPARIN SOD (PORK) LOCK FLUSH 100 UNIT/ML IV SOLN
250.0000 [IU] | Freq: Every day | INTRAVENOUS | Status: DC
  Filled 2013-03-05: qty 3

## 2013-03-05 MED ORDER — HEPARIN SOD (PORK) LOCK FLUSH 100 UNIT/ML IV SOLN
250.0000 [IU] | INTRAVENOUS | Status: DC | PRN
  Administered 2013-03-05: 250 [IU]
  Filled 2013-03-05: qty 3

## 2013-03-05 NOTE — Progress Notes (Addendum)
RN spoke with patient about not being able to transfer to [facility] rehab because of it being so late and they would not be able to obtain her medication. Patient stated she understood and asked me to call and notify her son that she would be spending another night. RN notified Dorothey Baseman, patients son. CLum Keas

## 2013-03-05 NOTE — Progress Notes (Signed)
Clinical Social Work Department CLINICAL SOCIAL WORK PLACEMENT NOTE 03/05/2013  Patient:  [name] [name], [name] [name]  Account Number:  [id number] Admit date:  01/28/2013  Clinical Social Worker:  Cori Razor, LCSW  Date/time:  01/29/2013 11:37 AM  Clinical Social Work is seeking post-discharge placement for this patient at the following level of care:   SKILLED NURSING   (*CSW will update this form in Epic as items are completed)     Patient/family provided with [facility] Department of Clinical Social Work's list of facilities offering this level of care within the geographic area requested by the patient (or if unable, by the patient's family).  01/29/2013  Patient/family informed of their freedom to choose among providers that offer the needed level of care, that participate in Medicare, Medicaid or managed care program needed by the patient, have an available bed and are willing to accept the patient.  01/29/2013  Patient/family informed of [facility]' ownership interest in [facility], as well as of the fact that they are under no obligation to receive care at this facility.  PASARR submitted to EDS on 01/29/2013 PASARR number received from EDS on 01/29/2013  FL2 transmitted to all facilities in geographic area requested by pt/family on  01/29/2013 FL2 transmitted to all facilities within larger geographic area on   Patient informed that his/her managed care company has contracts with or will negotiate with  certain facilities, including the following:     Patient/family informed of bed offers received:  01/29/2013 Patient chooses bed at [facility] Physician recommends and patient chooses bed at    Patient to be transferred to [facility]F on  03/05/2013 Patient to be transferred to facility by   The following physician request were entered in Epic:   Additional Comments:  Cori Razor LCSW [phone]

## 2013-03-05 NOTE — Progress Notes (Signed)
Subjective: 8 Days Post-Op Procedure(s) (LRB): IRRIGATION AND DEBRIDIMENT  RIGHT KNEE AND REMOVAL AND REPLACEMENT OF ANTIBIOTIC SPACERS RIGHT KNEE (Right) Patient reports pain as mild.   Patient seen in rounds with Dr. Lequita Halt.  Patient in good spirits. Patient is well, and has had no acute complaints or problems Patient is ready to go to the SNF today.  She was unable to go last night due to the SNF unable to obtain her medications.  Kept in the hospital another night.  Will transfer today.  Objective: Vital signs in last 24 hours: Temp:  [98.1 F (36.7 C)-98.3 F (36.8 C)] 98.3 F (36.8 C) (12/09 0545) Pulse Rate:  [78-84] 84 (12/09 0545) Resp:  [16] 16 (12/09 0545) BP: (164-170)/(75-77) 164/77 mmHg (12/09 0545) SpO2:  [96 %] 96 % (12/09 0545)  Intake/Output from previous day:  Intake/Output Summary (Last 24 hours) at 03/05/13 0805 Last data filed at 03/05/13 1610  Gross per 24 hour  Intake    760 ml  Output   2150 ml  Net  -1390 ml    Intake/Output this shift: Total I/O In: -  Out: 400 [Urine:400]  Labs: No results found for this basename: HGB,  in the last 72 hours No results found for this basename: WBC, RBC, HCT, PLT,  in the last 72 hours  Recent Labs  03/04/13 0410 03/05/13 0510  NA 132* 133*  K 4.0 4.0  CL 95* 97  CO2 25 27  BUN 7 7  CREATININE 0.52 0.57  GLUCOSE 96 83  CALCIUM 8.6 9.0   No results found for this basename: LABPT, INR,  in the last 72 hours  EXAM: General - Patient is Alert and Appropriate Extremity - Neurovascular intact Sensation intact distally Incision - clean, dry, no drainage, healing  Staples and sutures are intact Motor Function - intact, moving foot and toes well on exam.   Assessment/Plan: 8 Days Post-Op Procedure(s) (LRB): IRRIGATION AND DEBRIDIMENT  RIGHT KNEE AND REMOVAL AND REPLACEMENT OF ANTIBIOTIC SPACERS RIGHT KNEE (Right) Procedure(s) (LRB): IRRIGATION AND DEBRIDIMENT  RIGHT KNEE AND REMOVAL AND REPLACEMENT  OF ANTIBIOTIC SPACERS RIGHT KNEE (Right) Past Medical History  Diagnosis Date  . Mixed hyperlipidemia   . Hypothyroidism   . GERD (gastroesophageal reflux disease)   . Atrial fibrillation     Remote amiodarone use, recurred in 07/2011 post op from knee arthroplasty  . Obese   . History of blood transfusion 08/31/2011    A+ type  . History of GI bleed     Gastritis, was on Xarelto and ASA  . Carpal tunnel syndrome   . Arthritis     Knees  . DJD (degenerative joint disease), thoracolumbar   . Rheumatoid arthritis   . H/O bronchitis   . Hemorrhoids   . Measles     as child  . Mumps     as child  . Carotid artery disease   . PONV (postoperative nausea and vomiting)   . Anemia   . Difficulty sleeping   . 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   Weight as of this encounter: 68 kg (149 lb 14.6 oz). Discharge to SNF DVT Prophylaxis - SCD's  Continue with IV vancomycin  PICC line for IV antibiotic therapy.  Group B Sttrep infection  Eterococcus infection  Touch Down Weight Bearing ONLY and No Bending or No Motion  Disposition - [facility]  COD - Stable  F/U on this Thursday (in two more days) with Dr. Lequita Halt Follow up with Dr. Drue Second in approx 3 weeks - [facility] - Contact their office for appointment scheduling.  PERKINS, ALEXZANDREW 03/05/2013, 8:05 AM

## 2013-03-05 NOTE — Progress Notes (Signed)
PN from last night reviewed. [facility] contacted. SNF apologized for not admitting pt last night. " It was our mistake. We had pt's medications at the facility but the nurse taking the call was not aware that we had pt's meds. " Pt/Md/NSG updated. Pt will be d/c to [facility] this am at 11. [facility] has been arranged.  Asher Muir Roldan Laforest LCSW [phone]

## 2013-03-05 NOTE — Discharge Summary (Signed)
ADDENDUM Physician Discharge Summary  Patient ID:  [name] [name]  MRN: [id number]  DOB/AGE: 06-02-1930 79 y.o.   Admit date: 01/28/2013   Discharge date: New Date of Discharge - 03-05-2013   Primary Diagnosis:  Right Knee Infection  Right Knee Wound Dehiscence  S/P Right Total Knee Replacement  Admission Diagnoses:  Past Medical History   Diagnosis  Date   .  Mixed hyperlipidemia    .  Hypothyroidism    .  GERD (gastroesophageal reflux disease)    .  Atrial fibrillation      Remote amiodarone use, recurred in 07/2011 post op from knee arthroplasty   .  Obese    .  History of blood transfusion  08/31/2011     A+ type   .  History of GI bleed      Gastritis, was on Xarelto and ASA   .  Carpal tunnel syndrome    .  Arthritis      Knees   .  DJD (degenerative joint disease), thoracolumbar    .  Rheumatoid arthritis    .  H/O bronchitis    .  Hemorrhoids    .  Measles      as child   .  Mumps      as child   .  Carotid artery disease    .  PONV (postoperative nausea and vomiting)    .  Anemia    .  Difficulty sleeping    .  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  #2 - 02/06/2013 - Irrigation and debridement of right knee.  #3 - 02/15/2013 - Irrigation and debridement of right knee with exchange of antibiotic spacer.  #4 - 02/25/2013 - Irrigation and debridement of right knee and exchange of antibiotic spacer.    Transfusions:  02/02/2013 - 2 units  02/06/2013 - 2 units intraop  02/08/2013 - 2 units  02/26/2013 - 2 units    Consults: Infectious Disease, Dr. Lady Gary  Addendum Hospital Course: HD 34 - Monday - Arrangements were made for the patient to go over to [facility] but they were not able to arrange all of her medications in time.  She remained at [facility] another night. HD 35 - Tuesday 03/05/2013 - Patient seen in rounds by Dr. Lequita Halt.  Dressing changed and looks stable. No complaints fromt eh patient.  Spoke with Asher Muir, Child psychotherapist, and will send the patient over to the SNF facility today.  Please see the original DC Summary for all the medications and patient care directions.  Diet: Cardiac diet  Activity: Touch Down Weight Bearing ONLY and No Bending or No Motion  Follow-up: on Thursday 03/07/2013 with Dr. Lequita Halt  Follow up with Dr. Drue Second in approx 3 weeks [facility]  Continue with IV vancomycin for six weeks. Changed to Vanc on 02/16/2013 and will continue until January 2nd for a total of six weeks of Vanc.  PICC line for  IV antibiotic therapy.  Weekly CBC and BMETs at the SNF. Send results to Dr. Feliz Beam office at [facility] for Infect[facility] Diseases for monitoring of Vanc IV ABX.  Disposition - Skilled nursing facility - [facility]  Discharged Condition: stable  Avel Peace, [place]

## 2013-03-12 ENCOUNTER — Inpatient Hospital Stay: Payer: Medicare Other | Admitting: Internal Medicine

## 2013-03-19 ENCOUNTER — Encounter: Payer: Self-pay | Admitting: Internal Medicine

## 2013-03-19 ENCOUNTER — Ambulatory Visit (INDEPENDENT_AMBULATORY_CARE_PROVIDER_SITE_OTHER): Payer: Medicare Other | Admitting: Internal Medicine

## 2013-03-19 VITALS — BP 108/59 | HR 86 | Temp 97.3°F

## 2013-03-19 DIAGNOSIS — T889XXS Complication of surgical and medical care, unspecified, sequela: Secondary | ICD-10-CM

## 2013-03-19 DIAGNOSIS — T8450XS Infection and inflammatory reaction due to unspecified internal joint prosthesis, sequela: Secondary | ICD-10-CM

## 2013-03-19 LAB — BASIC METABOLIC PANEL WITH GFR
CO2: 29 mEq/L (ref 19–32)
Chloride: 94 mEq/L — ABNORMAL LOW (ref 96–112)
Creat: 0.71 mg/dL (ref 0.50–1.10)
GFR, Est African American: >89 mL/min
GFR, Est Non African American: 81 mL/min
Potassium: 4.4 mEq/L (ref 3.5–5.3)

## 2013-03-19 LAB — C-REACTIVE PROTEIN: CRP: 9.6 mg/dL — ABNORMAL HIGH (ref <0.60)

## 2013-03-19 LAB — SEDIMENTATION RATE: Sed Rate: 53 mm/hr — ABNORMAL HIGH (ref 0–22)

## 2013-03-19 NOTE — Progress Notes (Signed)
Subjective:    Patient ID: [name] [name], female    DOB: 19-Dec-1930, 79 y.o.   MRN: [id number]  HPI [name] [name] is a 79 y.o. female who originally underwent right TKA in October 08 2012 c/b post-op hematoma requiring I x D on POD#3, It was later complicated by wound drainage < 1 month post op. She had developed open wound that had local wound care plus oral antibiotics and eventually had I x D #2 on 9/17 with wound vac placement at that time. She had some improvement to the point where wound vac removed on 10/21, and returned to wet- to dry dressing changes. While at SNF and followed at wound care center, wound started to have worsening dehiscence with purulent sanginous drainage.[name] She was admitted on 11/1 for sustaining a ground level fall after falling off commode and sustaining left temporal abrasian, requiring sutures and also had neumerous cervical fractures and likely ligamentous injury. Since her right knee wound dehiscence was progressed, Dr. Lequita Halt also thought she would benefit from I X D of suspected infected right TKA. During her complicated hospitalization from 11/1- 12/9. She had numerous surgeries for her infected right knee prosthesis:  #1 - 01/28/2013 - Right knee resection arthroplasty, antibiotic cement spacer placement. OR cultures isolated Group B Strep -> placed on ceftriaxone 2gm IV daily (pt has hx of PCN hives, but tolerated ctx without difficulty) #2 - 02/06/2013 - Irrigation and debridement of right knee.  #3 - 02/15/2013 - Irrigation and debridement of right knee with exchange of antibiotic spacer. OR cultures isolated enterococcus (amp S) -> abtx changed to vancomycin x 6 wks #4 - 02/25/2013 - Irrigation and debridement of right knee and exchange of antibiotic spacer.   She was discharged on 12/09 to SNF to be on vancomycin until January 2nd. Discharge records also looks like she was also placed on doxycycline in addn to vanco for wound dehiscence. She states that she saw  aluisio last week, plan is to have fusion sometime in January. She continues to work with PT at [facility].  Allergies  Allergen Reactions  . Omeprazole Nausea And Vomiting  . Penicillins Itching  . Morphine Rash    And hot flashes  . Xarelto [Rivaroxaban] Other (See Comments)    Bleeding,spitting up blood   Current Outpatient Prescriptions on File Prior to Visit  Medication Sig Dispense Refill  . acetaminophen (TYLENOL) 325 MG tablet Take 2 tablets (650 mg total) by mouth every 6 (six) hours as needed for mild pain (or Fever >/= 101).  60 tablet  0  . ALPRAZolam (XANAX) 0.5 MG tablet Take 0.5-1 mg by mouth 2 (two) times daily. Take one tablet in the morning and two tablets in the evening.      [name] aspirin EC 81 MG tablet Take 81 mg by mouth every morning.      . bisacodyl (DULCOLAX) 10 MG suppository Place 1 suppository (10 mg total) rectally daily as needed for moderate constipation.  12 suppository  0  . docusate sodium (COLACE) 100 MG capsule Take 100 mg by mouth every morning.       [name] esomeprazole (NEXIUM) 40 MG capsule Take 40 mg by mouth every morning.       . feeding supplement, ENSURE COMPLETE, (ENSURE COMPLETE) LIQD Take 237 mLs by mouth 3 (three) times daily with meals.  30 Bottle  0  . ferrous sulfate 325 (65 FE) MG tablet Take 325 mg by mouth 2 (two) times daily.       [name]  gabapentin (NEURONTIN) 300 MG capsule Take 300 mg by mouth 2 (two) times daily.      [name] HYDROcodone-acetaminophen (NORCO/VICODIN) 5-325 MG per tablet Take 1-2 tablets by mouth every 4 (four) hours as needed (moderate to severe pain).  80 tablet  0  . levothyroxine (SYNTHROID, LEVOTHROID) 150 MCG tablet Take 1 tablet (150 mcg total) by mouth daily before breakfast.  30 tablet  0  . methocarbamol (ROBAXIN) 500 MG tablet Take 1 tablet (500 mg total) by mouth every 6 (six) hours as needed (For muscle spasms.).  80 tablet  0  . metoCLOPramide (REGLAN) 5 MG tablet Take 1-2 tablets (5-10 mg total) by mouth every 8 (eight) hours  as needed for nausea (if ondansetron (ZOFRAN) ineffective.).  40 tablet  0  . ondansetron (ZOFRAN) 4 MG tablet Take 1 tablet (4 mg total) by mouth every 6 (six) hours as needed for nausea.  40 tablet  0  . polyethylene glycol (MIRALAX / GLYCOLAX) packet Take 17 g by mouth daily as needed for mild constipation.  14 each  0  . promethazine (PHENERGAN) 25 MG tablet Take 0.5-1 tablets (12.5-25 mg total) by mouth every 6 (six) hours as needed for nausea.  30 tablet  0  . simvastatin (ZOCOR) 20 MG tablet Take 20 mg by mouth every morning.      . tolterodine (DETROL LA) 4 MG 24 hr capsule Take 4 mg by mouth every morning.      . traMADol (ULTRAM) 50 MG tablet Take 1 tablet (50 mg total) by mouth every 6 (six) hours as needed (mild pain).  80 tablet  0  . Vancomycin (VANCOCIN) 750 MG/150ML SOLN Inject 150 mLs (750 mg total) into the vein every 12 (twelve) hours. IV Vancomycin for 50 more doses to complete a six week course of IV ABX via PICC Line.  750 mL  50  . [DISCONTINUED] tolterodine (DETROL LA) 4 MG 24 hr capsule Take 4 mg by mouth daily.        No current facility-administered medications on file prior to visit.   Active Ambulatory Problems    Diagnosis Date Noted  . HYPERLIPIDEMIA-MIXED 11/10/2008  . Essential hypertension, benign 11/11/2008  . Carotid artery occlusion 06/03/2010  . Atrial fibrillation 08/13/2011  . Abnormal chest CT 08/15/2011  . Contusion of right foot 10/05/2012  . Pain in joint, ankle and foot 10/05/2012  . OA (osteoarthritis) of knee 10/08/2012  . Hyponatremia 10/09/2012  . Postoperative anemia due to acute blood loss 10/09/2012  . Postop Transfusion 10/10/2012  . Hematoma following procedure 10/11/2012  . PVC's (premature ventricular contractions) 10/12/2012  . Pain, right shoulder 11/13/2012  . Nausea 11/13/2012  . Surgical wound infection 11/13/2012  . Pain in right knee 11/13/2012  . Leukocytosis 11/13/2012  . Hypothyroidism 11/13/2012  . Diarrhea 11/13/2012   . Necrosis of surgical wound 12/12/2012  . 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 Group B streptococcal infection 02/04/2013  . Protein-calorie malnutrition, severe 02/19/2013  . Severe malnutrition 02/20/2013   Resolved Ambulatory Problems    Diagnosis Date Noted  . Atrial fibrillation 11/11/2008  . DYSHIDROTIC ECZEMA 11/12/2008  . SKIN RASH 06/01/2009  . CAROTID BRUIT, RIGHT 11/12/2008  . UNSPEC CERBRL ART OCCLUSION W/O MENTION INFARCT 03/15/2010  . OA (osteoarthritis) of knee 08/10/2011  . Postop Hyponatremia 08/12/2011  . Postop Acute blood loss anemia 08/12/2011  . Postop Transfusion 08/12/2011  . Fever 08/13/2011  .  Encephalopathy acute 08/13/2011  . Hypokalemia 08/13/2011  . Thrombocytopenia 08/13/2011  . Elevated troponin 08/13/2011  . Hematemesis/vomiting blood 12/04/2011   Past Medical History  Diagnosis Date  . Mixed hyperlipidemia   . GERD (gastroesophageal reflux disease)   . Obese   . History of blood transfusion 08/31/2011  . History of GI bleed   . Carpal tunnel syndrome   . Arthritis   . DJD (degenerative joint disease), thoracolumbar   . Rheumatoid arthritis   . H/O bronchitis   . Hemorrhoids   . Measles   . Mumps   . Carotid artery disease   . PONV (postoperative nausea and vomiting)   . Anemia   . Difficulty sleeping   . Multiple bruises    Soc hx: currently at SNF, no smoking or drinking  family history includes Cancer in an other family member; Diabetes in an other family member; Hypertension in an other family member; Stroke in an other family member. There is no history of Colon cancer, Colon polyps, Rectal cancer, or Stomach cancer.   Review of Systems No fever, chills, nightsweats. No thrush, vaginal yeast infection, no diarrhea. No n/v/good appetite. 12 point ROS negative except for knee discomfort    Objective:   Physical Exam BP 108/59   Pulse 86  Temp(Src) 97.3 F (36.3 C) (Oral) Physical Exam  Constitutional:  oriented to person, place, and time.  appears well-developed and well-nourished. No distress.  HENT: no thrush. Healed abrasian to left forehead from initial fall in november Mouth/Throat: Oropharynx is clear and moist. No oropharyngeal exudate.  Cardiovascular: Normal rate, regular rhythm and normal heart sounds. Exam reveals no gallop and no friction rub.  No murmur heard.  Pulmonary/Chest: Effort normal and breath sounds normal. No respiratory distress.  no wheezes.  Lymphadenopathy:  no cervical adenopathy.  Ext = right leg is in foam brace. Wrapped in kerlex with serous sanguinous exudate soaking through gauze. Still has steristrip to right knee. No dehiscence of incision site. Has one pinpoint exit site on anterior inferior aspect of patella that is oozing serous drainage. Right knee is warm to touch Psychiatric:  a normal mood and affect.  behavior is normal.       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   - we will treat her with vancomycin for 6 wks starting dec 2nd as the first day from her last surgery, where antibiotic spacer was exchanged. This makes her last day of antibiotics on January 15th.  - she is to continue with vancomycin and her picc line until we see her in clinic on January 13th at 10:30 am with Dr. Orvan Falconer.  - continue with weekly cbc, bmp, and vanco trough (goal of 15-20)  - we will check sed rate, crp, and bmp today  - have asked SNF to increase dressing changes to BID due to moderately soaked dressings with daily changes. She has next visit with Dr.  Lequita Halt on Jan 2nd to assess healing.  - if continues to have poor healing, may need to repeat arthrocentesis to send fluid for analysis and culture to ensure that there is not further infection  cc: aluisio

## 2013-04-09 ENCOUNTER — Encounter: Payer: Self-pay | Admitting: Infectious Disease

## 2013-04-09 ENCOUNTER — Ambulatory Visit: Payer: Medicare Other | Admitting: Internal Medicine

## 2013-04-09 ENCOUNTER — Ambulatory Visit (INDEPENDENT_AMBULATORY_CARE_PROVIDER_SITE_OTHER): Payer: Medicare Other | Admitting: Infectious Disease

## 2013-04-09 VITALS — BP 110/61 | HR 84 | Temp 97.8°F

## 2013-04-09 DIAGNOSIS — Z96659 Presence of unspecified artificial knee joint: Secondary | ICD-10-CM

## 2013-04-09 DIAGNOSIS — T8459XA Infection and inflammatory reaction due to other internal joint prosthesis, initial encounter: Secondary | ICD-10-CM

## 2013-04-09 DIAGNOSIS — B951 Streptococcus, group B, as the cause of diseases classified elsewhere: Secondary | ICD-10-CM

## 2013-04-09 DIAGNOSIS — T8140XA Infection following a procedure, unspecified, initial encounter: Secondary | ICD-10-CM

## 2013-04-09 DIAGNOSIS — B952 Enterococcus as the cause of diseases classified elsewhere: Secondary | ICD-10-CM

## 2013-04-09 DIAGNOSIS — T8450XA Infection and inflammatory reaction due to unspecified internal joint prosthesis, initial encounter: Secondary | ICD-10-CM

## 2013-04-09 DIAGNOSIS — IMO0002 Reserved for concepts with insufficient information to code with codable children: Secondary | ICD-10-CM

## 2013-04-09 DIAGNOSIS — A491 Streptococcal infection, unspecified site: Secondary | ICD-10-CM

## 2013-04-09 DIAGNOSIS — T8149XA Infection following a procedure, other surgical site, initial encounter: Secondary | ICD-10-CM

## 2013-04-09 LAB — SEDIMENTATION RATE: SED RATE: 22 mm/h (ref 0–22)

## 2013-04-09 LAB — C-REACTIVE PROTEIN: CRP: 8.3 mg/dL — ABNORMAL HIGH (ref <0.60)

## 2013-04-09 NOTE — Progress Notes (Signed)
Subjective:    Patient ID: [name] [name], female    DOB: [birth year], 80 y.o.   MRN: [id number]  HPI  HPI [name] [name] is a 80 y.o. female who originally underwent right TKA in October 08 2012 c/b post-op hematoma requiring I x D on POD#3, It was later complicated by wound drainage < 1 month post op. She had developed open wound that had local wound care plus oral antibiotics and eventually had I x D #2 on 9/17 with wound vac placement at that time. She had some improvement to the point where wound vac removed on 10/21, and returned to wet- to dry dressing changes. While at SNF and followed at wound care center, wound started to have worsening dehiscence with purulent sanginous drainage.[name] She was admitted on 11/1 for sustaining a ground level fall after falling off commode and sustaining left temporal abrasian, requiring sutures and also had neumerous cervical fractures and likely ligamentous injury. Since her right knee wound dehiscence was progressed, Dr. Wynelle Link also thought she would benefit from I X D of suspected infected right TKA. During her complicated hospitalization from 11/1- 12/9. She had numerous surgeries for her infected right knee prosthesis:  #1 - 01/28/2013 - Right knee resection arthroplasty, antibiotic cement spacer placement. OR cultures isolated Group B Strep -> placed on ceftriaxone 2gm IV daily (pt has hx of PCN hives, but tolerated ctx without difficulty)  #2 - 02/06/2013 - Irrigation and debridement of right knee.  #3 - 02/15/2013 - Irrigation and debridement of right knee with exchange of antibiotic spacer. OR cultures isolated enterococcus (amp S) -> abtx changed to vancomycin x 6 wks  #4 - 02/25/2013 - Irrigation and debridement of right knee and exchange of antibiotic spacer.  She was discharged on 12/09 to SNF to be on vancomycin until January 2nd. Discharge records also looks like she was also placed on doxycycline in addn to vanco for wound dehiscence. She states that she  saw aluisio last week, plan is to have fusion sometime in January. She continues to work with PT at [facility].  Patient has nearly completed her IV vancomycin. She still suffers from significant pain in her knee where she has swelling she also has bleeding on her [place] as well her significant ecchymoses around the site see pictures during the exam below.  She is without fevers nausea or malaise.   [name]  Review of Systems  Constitutional: Positive for fatigue. Negative for fever, chills, diaphoresis, activity change and appetite change.  HENT: Negative for congestion, rhinorrhea, sinus pressure, sneezing, sore throat and trouble swallowing.   Respiratory: Negative for wheezing and stridor.   Cardiovascular: Positive for leg swelling. Negative for palpitations.  Gastrointestinal: Negative for nausea, vomiting, abdominal pain, diarrhea, constipation and abdominal distention.  Genitourinary: Negative for dysuria, hematuria, flank pain and difficulty urinating.  Musculoskeletal: Positive for arthralgias and joint swelling. Negative for back pain and myalgias.  Skin: Negative for color change, pallor, rash and wound.  Neurological: Negative for dizziness, tremors, weakness and light-headedness.  Hematological: Negative for adenopathy. Bruises/bleeds easily.  Psychiatric/Behavioral: Negative for behavioral problems, confusion, sleep disturbance, dysphoric mood, decreased concentration and agitation.       Objective:   Physical Exam  Constitutional: She is oriented to person, place, and time. No distress.  HENT:  Head: Normocephalic and atraumatic.  Mouth/Throat: Oropharynx is clear and moist. No oropharyngeal exudate.  Eyes: Conjunctivae and EOM are normal. No scleral icterus.  Neck: Normal range of motion. Neck  supple.  Cardiovascular: Normal rate, regular rhythm and normal heart sounds.  Exam reveals no friction rub.   No murmur heard. Pulmonary/Chest: Effort normal and  breath sounds normal. No respiratory distress. She has no wheezes. She has no rales.  Abdominal: Soft. She exhibits no distension. There is no tenderness.  Musculoskeletal: She exhibits edema. She exhibits no tenderness.       Legs: Neurological: She is alert and oriented to person, place, and time. She exhibits normal muscle tone. Coordination normal.  Skin: Skin is warm and dry. She is not diaphoretic. There is erythema. No pallor.     Psychiatric: She has a normal mood and affect. Her behavior is normal. Judgment and thought content normal.   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 I'll recheck her sedimentation rate and C-reactive protein now allow her to finish her 6 weeks course of vancomycin and pull the PICC line. She has followup with Dr. Maureen Ralphs this Friday. If knee is still concerning for infection would obtain  Repeat Aspirate for cell count and differential and CULTURE  Given her PCN allergy there are NOT good options for oral suppressive drugs. Zyvox could be tried but would be difficult for her to tolerate for much time.  I spent greater than 25 minutes with the patient including greater than 50% of time in face to face counsel of the patient and in coordination of their care.

## 2013-04-15 ENCOUNTER — Ambulatory Visit: Payer: Medicare Other | Admitting: Infectious Disease

## 2013-04-15 ENCOUNTER — Encounter (HOSPITAL_COMMUNITY): Payer: Self-pay

## 2013-04-15 ENCOUNTER — Inpatient Hospital Stay (HOSPITAL_COMMUNITY): Payer: Medicare Other

## 2013-04-15 ENCOUNTER — Inpatient Hospital Stay (HOSPITAL_COMMUNITY)
Admission: AD | Admit: 2013-04-15 | Discharge: 2013-04-28 | DRG: 853 | Disposition: E | Payer: Medicare Other | Source: Other Acute Inpatient Hospital | Attending: Pulmonary Disease | Admitting: Pulmonary Disease

## 2013-04-15 DIAGNOSIS — Z880 Allergy status to penicillin: Secondary | ICD-10-CM

## 2013-04-15 DIAGNOSIS — E039 Hypothyroidism, unspecified: Secondary | ICD-10-CM | POA: Diagnosis present

## 2013-04-15 DIAGNOSIS — I4891 Unspecified atrial fibrillation: Secondary | ICD-10-CM | POA: Diagnosis present

## 2013-04-15 DIAGNOSIS — R6520 Severe sepsis without septic shock: Secondary | ICD-10-CM

## 2013-04-15 DIAGNOSIS — R6521 Severe sepsis with septic shock: Secondary | ICD-10-CM

## 2013-04-15 DIAGNOSIS — D649 Anemia, unspecified: Secondary | ICD-10-CM | POA: Diagnosis present

## 2013-04-15 DIAGNOSIS — E876 Hypokalemia: Secondary | ICD-10-CM | POA: Diagnosis not present

## 2013-04-15 DIAGNOSIS — Z881 Allergy status to other antibiotic agents status: Secondary | ICD-10-CM

## 2013-04-15 DIAGNOSIS — Y831 Surgical operation with implant of artificial internal device as the cause of abnormal reaction of the patient, or of later complication, without mention of misadventure at the time of the procedure: Secondary | ICD-10-CM | POA: Diagnosis present

## 2013-04-15 DIAGNOSIS — K560 Paralytic ileus: Secondary | ICD-10-CM | POA: Diagnosis present

## 2013-04-15 DIAGNOSIS — D72823 Leukemoid reaction: Secondary | ICD-10-CM | POA: Diagnosis present

## 2013-04-15 DIAGNOSIS — T8450XA Infection and inflammatory reaction due to unspecified internal joint prosthesis, initial encounter: Secondary | ICD-10-CM | POA: Diagnosis present

## 2013-04-15 DIAGNOSIS — D72829 Elevated white blood cell count, unspecified: Secondary | ICD-10-CM

## 2013-04-15 DIAGNOSIS — M069 Rheumatoid arthritis, unspecified: Secondary | ICD-10-CM | POA: Diagnosis present

## 2013-04-15 DIAGNOSIS — Z96659 Presence of unspecified artificial knee joint: Secondary | ICD-10-CM

## 2013-04-15 DIAGNOSIS — K219 Gastro-esophageal reflux disease without esophagitis: Secondary | ICD-10-CM | POA: Diagnosis present

## 2013-04-15 DIAGNOSIS — K5190 Ulcerative colitis, unspecified, without complications: Secondary | ICD-10-CM | POA: Diagnosis present

## 2013-04-15 DIAGNOSIS — Z7982 Long term (current) use of aspirin: Secondary | ICD-10-CM

## 2013-04-15 DIAGNOSIS — A419 Sepsis, unspecified organism: Secondary | ICD-10-CM | POA: Diagnosis not present

## 2013-04-15 DIAGNOSIS — E8809 Other disorders of plasma-protein metabolism, not elsewhere classified: Secondary | ICD-10-CM | POA: Diagnosis present

## 2013-04-15 DIAGNOSIS — E782 Mixed hyperlipidemia: Secondary | ICD-10-CM | POA: Diagnosis present

## 2013-04-15 DIAGNOSIS — I509 Heart failure, unspecified: Secondary | ICD-10-CM | POA: Diagnosis present

## 2013-04-15 DIAGNOSIS — Z885 Allergy status to narcotic agent status: Secondary | ICD-10-CM

## 2013-04-15 DIAGNOSIS — E871 Hypo-osmolality and hyponatremia: Secondary | ICD-10-CM | POA: Diagnosis present

## 2013-04-15 DIAGNOSIS — A0472 Enterocolitis due to Clostridium difficile, not specified as recurrent: Secondary | ICD-10-CM

## 2013-04-15 DIAGNOSIS — Z66 Do not resuscitate: Secondary | ICD-10-CM | POA: Diagnosis present

## 2013-04-15 DIAGNOSIS — Z79899 Other long term (current) drug therapy: Secondary | ICD-10-CM

## 2013-04-15 LAB — COMPREHENSIVE METABOLIC PANEL
ALT: 16 U/L (ref 0–35)
AST: 37 U/L (ref 0–37)
Albumin: 1.9 g/dL — ABNORMAL LOW (ref 3.5–5.2)
Alkaline Phosphatase: 150 U/L — ABNORMAL HIGH (ref 39–117)
BUN: 21 mg/dL (ref 6–23)
CALCIUM: 7.8 mg/dL — AB (ref 8.4–10.5)
CO2: 19 meq/L (ref 19–32)
CREATININE: 0.89 mg/dL (ref 0.50–1.10)
Chloride: 92 mEq/L — ABNORMAL LOW (ref 96–112)
GFR calc Af Amer: 69 mL/min — ABNORMAL LOW (ref >90)
GFR, EST NON AFRICAN AMERICAN: 60 mL/min — AB (ref >90)
GLUCOSE: 127 mg/dL — AB (ref 70–99)
Potassium: 4.7 mEq/L (ref 3.7–5.3)
Sodium: 126 mEq/L — ABNORMAL LOW (ref 137–147)
Total Bilirubin: 0.8 mg/dL (ref 0.3–1.2)
Total Protein: 5.9 g/dL — ABNORMAL LOW (ref 6.0–8.3)

## 2013-04-15 LAB — CBC WITH DIFFERENTIAL/PLATELET
BAND NEUTROPHILS: 0 % (ref 0–10)
BLASTS: 0 %
Basophils Absolute: 0 10*3/uL (ref 0.0–0.1)
Basophils Relative: 0 % (ref 0–1)
EOS ABS: 0 10*3/uL (ref 0.0–0.7)
EOS PCT: 0 % (ref 0–5)
HCT: 36 % (ref 36.0–46.0)
Hemoglobin: 12.5 g/dL (ref 12.0–15.0)
Lymphocytes Relative: 3 % — ABNORMAL LOW (ref 12–46)
Lymphs Abs: 5.5 10*3/uL — ABNORMAL HIGH (ref 0.7–4.0)
MCH: 30 pg (ref 26.0–34.0)
MCHC: 34.7 g/dL (ref 30.0–36.0)
MCV: 86.3 fL (ref 78.0–100.0)
MYELOCYTES: 0 %
Metamyelocytes Relative: 0 %
Monocytes Absolute: 3.6 10*3/uL — ABNORMAL HIGH (ref 0.1–1.0)
Monocytes Relative: 2 % — ABNORMAL LOW (ref 3–12)
Neutro Abs: 172.9 10*3/uL — ABNORMAL HIGH (ref 1.7–7.7)
Neutrophils Relative %: 95 % — ABNORMAL HIGH (ref 43–77)
PLATELETS: 423 10*3/uL — AB (ref 150–400)
Promyelocytes Absolute: 0 %
RBC: 4.17 MIL/uL (ref 3.87–5.11)
RDW: 16.1 % — ABNORMAL HIGH (ref 11.5–15.5)
WBC: 182 10*3/uL (ref 4.0–10.5)
nRBC: 0 /100 WBC

## 2013-04-15 LAB — URINALYSIS, ROUTINE W REFLEX MICROSCOPIC
Glucose, UA: NEGATIVE mg/dL
Hgb urine dipstick: NEGATIVE
Ketones, ur: 15 mg/dL — AB
NITRITE: POSITIVE — AB
PH: 5.5 (ref 5.0–8.0)
Protein, ur: 100 mg/dL — AB
SPECIFIC GRAVITY, URINE: 1.038 — AB (ref 1.005–1.030)
Urobilinogen, UA: 1 mg/dL (ref 0.0–1.0)

## 2013-04-15 LAB — TROPONIN I: Troponin I: <0.3 ng/mL (ref <0.30)

## 2013-04-15 LAB — MAGNESIUM: Magnesium: 1.9 mg/dL (ref 1.5–2.5)

## 2013-04-15 LAB — URINE MICROSCOPIC-ADD ON

## 2013-04-15 LAB — LIPASE, BLOOD: LIPASE: 10 U/L — AB (ref 11–59)

## 2013-04-15 LAB — PROCALCITONIN: Procalcitonin: 2.19 ng/mL

## 2013-04-15 LAB — LACTIC ACID, PLASMA: Lactic Acid, Venous: 2.6 mmol/L — ABNORMAL HIGH (ref 0.5–2.2)

## 2013-04-15 LAB — TYPE AND SCREEN
ABO/RH(D): A POS
ANTIBODY SCREEN: NEGATIVE

## 2013-04-15 LAB — PROTIME-INR
INR: 1.49 (ref 0.00–1.49)
PROTHROMBIN TIME: 17.6 s — AB (ref 11.6–15.2)

## 2013-04-15 LAB — PHOSPHORUS: PHOSPHORUS: 1.9 mg/dL — AB (ref 2.3–4.6)

## 2013-04-15 LAB — APTT: aPTT: 31 seconds (ref 24–37)

## 2013-04-15 LAB — MRSA PCR SCREENING: MRSA by PCR: NEGATIVE

## 2013-04-15 MED ORDER — ENOXAPARIN SODIUM 40 MG/0.4ML ~~LOC~~ SOLN
40.0000 mg | SUBCUTANEOUS | Status: DC
  Administered 2013-04-15: 40 mg via SUBCUTANEOUS
  Filled 2013-04-15 (×2): qty 0.4

## 2013-04-15 MED ORDER — VANCOMYCIN 50 MG/ML ORAL SOLUTION
125.0000 mg | Freq: Four times a day (QID) | ORAL | Status: DC
  Filled 2013-04-15 (×3): qty 2.5

## 2013-04-15 MED ORDER — METOPROLOL TARTRATE 1 MG/ML IV SOLN
2.5000 mg | INTRAVENOUS | Status: DC | PRN
  Administered 2013-04-15: 2.5 mg via INTRAVENOUS
  Administered 2013-04-16 (×2): 5 mg via INTRAVENOUS
  Administered 2013-04-16: 2.5 mg via INTRAVENOUS
  Filled 2013-04-15 (×4): qty 5

## 2013-04-15 MED ORDER — VANCOMYCIN 50 MG/ML ORAL SOLUTION
500.0000 mg | Freq: Four times a day (QID) | ORAL | Status: DC
  Administered 2013-04-15 – 2013-04-16 (×4): 500 mg via ORAL
  Filled 2013-04-15 (×7): qty 10

## 2013-04-15 MED ORDER — VANCOMYCIN HCL IN DEXTROSE 750-5 MG/150ML-% IV SOLN
750.0000 mg | Freq: Two times a day (BID) | INTRAVENOUS | Status: DC
  Administered 2013-04-15 – 2013-04-16 (×3): 750 mg via INTRAVENOUS
  Filled 2013-04-15 (×4): qty 150

## 2013-04-15 MED ORDER — POTASSIUM & SODIUM PHOSPHATES 280-160-250 MG PO PACK
1.0000 | PACK | Freq: Three times a day (TID) | ORAL | Status: AC
  Administered 2013-04-15 – 2013-04-16 (×3): 1 via ORAL
  Filled 2013-04-15 (×3): qty 1

## 2013-04-15 MED ORDER — SODIUM CHLORIDE 0.9 % IV SOLN
INTRAVENOUS | Status: DC
  Administered 2013-04-15 – 2013-04-16 (×2): via INTRAVENOUS

## 2013-04-15 MED ORDER — DEXTROSE 5 % IV SOLN
5.0000 mg/h | INTRAVENOUS | Status: DC
  Administered 2013-04-15 – 2013-04-16 (×3): 15 mg/h via INTRAVENOUS
  Filled 2013-04-15: qty 100

## 2013-04-15 MED ORDER — PERFLUTREN LIPID MICROSPHERE
INTRAVENOUS | Status: AC
  Filled 2013-04-15: qty 10

## 2013-04-15 MED ORDER — METRONIDAZOLE IN NACL 5-0.79 MG/ML-% IV SOLN
500.0000 mg | Freq: Three times a day (TID) | INTRAVENOUS | Status: DC
  Administered 2013-04-15 – 2013-04-16 (×3): 500 mg via INTRAVENOUS
  Filled 2013-04-15 (×6): qty 100

## 2013-04-15 MED ORDER — AMIODARONE HCL IN DEXTROSE 360-4.14 MG/200ML-% IV SOLN
60.0000 mg/h | INTRAVENOUS | Status: AC
  Administered 2013-04-15: 60 mg/h via INTRAVENOUS
  Filled 2013-04-15: qty 200

## 2013-04-15 MED ORDER — SODIUM CHLORIDE 0.9 % IV SOLN
250.0000 mL | INTRAVENOUS | Status: DC | PRN
  Administered 2013-04-16: 14:00:00 via INTRAVENOUS

## 2013-04-15 MED ORDER — AMIODARONE HCL IN DEXTROSE 360-4.14 MG/200ML-% IV SOLN
30.0000 mg/h | INTRAVENOUS | Status: DC
Start: 2013-04-15 — End: 2013-04-17
  Administered 2013-04-15 – 2013-04-16 (×3): 30 mg/h via INTRAVENOUS
  Filled 2013-04-15 (×6): qty 200

## 2013-04-15 NOTE — Progress Notes (Addendum)
ANTIBIOTIC CONSULT NOTE - INITIAL  Pharmacy Consult for vancomycin IV, vancomycin po, flagyl IV Indication: enterococcal wound infection  Allergies  Allergen Reactions  . Omeprazole Nausea And Vomiting  . Penicillins Itching  . Morphine Rash    And hot flashes  . Xarelto [Rivaroxaban] Other (See Comments)    Bleeding,spitting up blood    Patient Measurements: Height: 5' 5.5" (166.4 cm) Weight: 152 lb 1.9 oz (69 kg) IBW/kg (Calculated) : 58.15  Vital Signs: Temp: 97.6 F (36.4 C) (01/19 1318) Temp src: Oral (01/19 1318) BP: 97/66 mmHg (01/19 1300) Pulse Rate: 111 (01/19 1318) Intake/Output from previous day:   Intake/Output from this shift:    Labs: No results found for this basename: WBC, HGB, PLT, LABCREA, CREATININE,  in the last 72 hours Estimated Creatinine Clearance: 51.5 ml/min (by C-G formula based on Cr of 0.71). No results found for this basename: VANCOTROUGH, VANCOPEAK, VANCORANDOM, GENTTROUGH, GENTPEAK, GENTRANDOM, TOBRATROUGH, TOBRAPEAK, TOBRARND, AMIKACINPEAK, AMIKACINTROU, AMIKACIN,  in the last 72 hours   Microbiology: No results found for this or any previous visit (from the past 720 hour(s)).  Medical History: Past Medical History  Diagnosis Date  . Mixed hyperlipidemia   . Hypothyroidism   . GERD (gastroesophageal reflux disease)   . Atrial fibrillation     Remote amiodarone use, recurred in 07/2011 post op from knee arthroplasty  . Obese   . History of blood transfusion 08/31/2011    A+ type  . History of GI bleed     Gastritis, was on Xarelto and ASA  . Carpal tunnel syndrome   . Arthritis     Knees  . DJD (degenerative joint disease), thoracolumbar   . Rheumatoid arthritis   . H/O bronchitis   . Hemorrhoids   . Measles     as child  . Mumps     as child  . Carotid artery disease   . PONV (postoperative nausea and vomiting)   . Anemia   . Difficulty sleeping   . Multiple bruises     DUE TO FALL 01/24/13    Medications:   Prescriptions prior to admission  Medication Sig Dispense Refill  . acetaminophen (TYLENOL) 325 MG tablet Take 2 tablets (650 mg total) by mouth every 6 (six) hours as needed for mild pain (or Fever >/= 101).  60 tablet  0  . ALPRAZolam (XANAX) 0.5 MG tablet Take 0.5-1 mg by mouth 2 (two) times daily. Take one tablet in the morning and two tablets in the evening.      [name] aspirin EC 81 MG tablet Take 81 mg by mouth every morning.      . bisacodyl (DULCOLAX) 10 MG suppository Place 1 suppository (10 mg total) rectally daily as needed for moderate constipation.  12 suppository  0  . docusate sodium (COLACE) 100 MG capsule Take 100 mg by mouth every morning.       [name] esomeprazole (NEXIUM) 40 MG capsule Take 40 mg by mouth every morning.       . feeding supplement, ENSURE COMPLETE, (ENSURE COMPLETE) LIQD Take 237 mLs by mouth 3 (three) times daily with meals.  30 Bottle  0  . ferrous sulfate 325 (65 FE) MG tablet Take 325 mg by mouth 2 (two) times daily.       [name] gabapentin (NEURONTIN) 300 MG capsule Take 300 mg by mouth 2 (two) times daily.      [name] HYDROcodone-acetaminophen (NORCO/VICODIN) 5-325 MG per tablet Take 1-2 tablets by mouth every 4 (four) hours  as needed (moderate to severe pain).  80 tablet  0  . levothyroxine (SYNTHROID, LEVOTHROID) 150 MCG tablet Take 1 tablet (150 mcg total) by mouth daily before breakfast.  30 tablet  0  . methocarbamol (ROBAXIN) 500 MG tablet Take 1 tablet (500 mg total) by mouth every 6 (six) hours as needed (For muscle spasms.).  80 tablet  0  . metoCLOPramide (REGLAN) 5 MG tablet Take 1-2 tablets (5-10 mg total) by mouth every 8 (eight) hours as needed for nausea (if ondansetron (ZOFRAN) ineffective.).  40 tablet  0  . ondansetron (ZOFRAN) 4 MG tablet Take 1 tablet (4 mg total) by mouth every 6 (six) hours as needed for nausea.  40 tablet  0  . polyethylene glycol (MIRALAX / GLYCOLAX) packet Take 17 g by mouth daily as needed for mild constipation.  14 each  0  .  promethazine (PHENERGAN) 25 MG tablet Take 0.5-1 tablets (12.5-25 mg total) by mouth every 6 (six) hours as needed for nausea.  30 tablet  0  . simvastatin (ZOCOR) 20 MG tablet Take 20 mg by mouth every morning.      . tolterodine (DETROL LA) 4 MG 24 hr capsule Take 4 mg by mouth every morning.      . traMADol (ULTRAM) 50 MG tablet Take 1 tablet (50 mg total) by mouth every 6 (six) hours as needed (mild pain).  80 tablet  0  . Vancomycin (VANCOCIN) 750 MG/150ML SOLN Inject 150 mLs (750 mg total) into the vein every 12 (twelve) hours. IV Vancomycin for 50 more doses to complete a six week course of IV ABX via PICC Line.  750 mL  50   Scheduled:  . enoxaparin (LOVENOX) injection  40 mg Subcutaneous Q24H    Assessment: 80 yo female transferred from [place] and noted with history of infected TKA. Patient was noted on vancomycin 750mg  IV q12 at [facility] for necrosis of the surgical wound. Plan at [facility] was IV vancomycin until  1/19. From records patient was on vancomycin at 10am and 10pm (dose was hung this am per RN at [facility]). Patient is noted c. Diff positive and it appears vancomycin po was started 1/18 ant patient was on flagyl po for a brief period before this  12/8 vancomycin>> 12/18 po vancomycin>>  Goal of Therapy:  Vancomycin trough level 15-20 mcg/ml  Plan:  -Vancomycin 750 mg IV q12h -Vancomycin 125mg  po q6h for 14 days -Flagyl 500mg  IV q8h -Will follow renal function, cultures and clinical progress  Hildred Laser, Pharm D 04/09/2013 1:37 PM

## 2013-04-15 NOTE — Care Management Note (Signed)
    Page 1 of 1   04/02/2013     2:45:39 PM   CARE MANAGEMENT NOTE 04/12/2013  Patient:  [name] [name], [name] [name]   Account Number:  [id number]  Date Initiated:  04/08/2013  Documentation initiated by:  Elissa Hefty  Subjective/Objective Assessment:   adm w sepsis     Action/Plan:   from nsg facility   Anticipated DC Date:     Anticipated DC Plan:  [place] referral  Clinical Social Worker      DC Planning Services  CM consult      Choice offered to / List presented to:             Status of service:   Medicare Important Message given?   (If response is "NO", the following Medicare IM given date fields will be blank) Date Medicare IM given:   Date Additional Medicare IM given:    Discharge Disposition:    Per UR Regulation:  Reviewed for med. necessity/level of care/duration of stay  If discussed at [place] of Stay Meetings, dates discussed:    Comments:

## 2013-04-15 NOTE — Progress Notes (Signed)
Subjective: Hospital day - 0 Patient admitted from [facility] for sepsis due to C.Diff colitis and A.Fib with RVR.  She is well known to Dr. Wynelle Link for issues with the right knee complicated by infection.  She has been followed by ID also during this time.   Patient was at [place] for her right knee infection. She has been seen by ID int he clinic a couple of times since her last admission.  Reviewed the pictures taken by Dr. Tommy Medal. She was originally discharged on IV Vancomycin and PO Doxycycline.  Objective: Vital signs in last 24 hours: Temp:  [97.6 F (36.4 C)] 97.6 F (36.4 C) (01/19 1318) Pulse Rate:  [86-156] 100 (01/19 1500) Resp:  [21-29] 25 (01/19 1500) BP: (97-141)/(61-87) 139/87 mmHg (01/19 1500) SpO2:  [93 %-97 %] 97 % (01/19 1500) Weight:  [69 kg (152 lb 1.9 oz)] 69 kg (152 lb 1.9 oz) (01/19 1318)  Intake/Output from previous day:  Intake/Output Summary (Last 24 hours) at 04/11/2013 1547 Last data filed at 04/20/2013 1400  Gross per 24 hour  Intake  57.08 ml  Output     25 ml  Net  32.08 ml    Intake/Output this shift: Total I/O In: 57.1 [I.V.:57.1] Out: 25 [Urine:25]  Labs:  Recent Labs  04/01/2013 1434  HGB 12.5    Recent Labs  04/08/2013 1434  WBC 182.0*  RBC 4.17  HCT 36.0  PLT 423*    Recent Labs  04/10/2013 1434  NA 126*  K 4.7  CL 92*  CO2 19  BUN 21  CREATININE 0.89  GLUCOSE 127*  CALCIUM 7.8*    Recent Labs  04/21/2013 1434  INR 1.49    EXAM General - Patient is Alert Extremity - Neurovascular intact Sensation intact distally No cellulitis present Skin/Wound - no drainage noted at this time.  She has two small eschars from previous wounds.  The more proximal is dry and she has a small area distal to the knee on the anterior tibial area that looks good.     Past Medical History  Diagnosis Date  . Mixed hyperlipidemia   . Hypothyroidism   . GERD (gastroesophageal  reflux disease)   . Atrial fibrillation     Remote amiodarone use, recurred in 07/2011 post op from knee arthroplasty  . Obese   . History of blood transfusion 08/31/2011    A+ type  . History of GI bleed     Gastritis, was on Xarelto and ASA  . Carpal tunnel syndrome   . Arthritis     Knees  . DJD (degenerative joint disease), thoracolumbar   . Rheumatoid arthritis   . H/O bronchitis   . Hemorrhoids   . Measles     as child  . Mumps     as child  . Carotid artery disease   . PONV (postoperative nausea and vomiting)   . Anemia   . Difficulty sleeping   . Multiple bruises     DUE TO FALL 01/24/13    Assessment/Plan: Hospital day - 0 Active Problems:   Clostridium difficile colitis Elevated WBC A.Fib with RVR - rate int he 120;'s during visit.  Estimated body mass index is 24.92 kg/(m^2) as calculated from the following:   Height as of this encounter: 5' 5.5" (1.664 m).   Weight as of this encounter: 69 kg (152 lb 1.9 oz).  Remain in the knee immobilizer at all time. No  bending or motion to the knee ABD pads on the knee to keep the immobilizer from rubbing off the current eschars.  Dr. Wynelle Link will be by the morning to check on her.  Mickel Crow 04/11/2013, 3:47 PM

## 2013-04-15 NOTE — H&P (Signed)
Name: [name] [name] MRN: [id number] DOB: 1930/06/05    ADMISSION DATE:  04/27/2013 CONSULTATION DATE:  1/19  REFERRING MD :  Maryruth Bun PRIMARY SERVICE: PCCM  CHIEF COMPLAINT:  Sepsis due to C. Diff colitis  BRIEF PATIENT DESCRIPTION:  80 yo caucasian female transferred from [facility] with sepsis due to severe C. Diff colitis with leukemoid reaction, afib-RVR, infected R TKA (s/p multiple surgeries). Patient arrived with PICC line, peripheral IV, amiodarone, and vancomycin. Admitted to PCCM for sepsis management and possible pressors for hypotension if needed.   SIGNIFICANT EVENTS / STUDIES:  1/16 CT abdomen - no megacolon 1/19>>> Transfer from [facility] due to C. Diff colitis  LINES / TUBES: PICC (right basilic) 01/29/13 >>> Foley 1/17 >>> PIV 1/17 >>>  CULTURES: none  ANTIBIOTICS: Vancomycin per [facility] med record (12/8 >>  HISTORY OF PRESENT ILLNESS:  80 yo female with PMH uncontrolled afib, chronic anemia, CHF (Difficult to assess LVEF per 07/2011 echo), chronic infected Right TKA (s/p 4 I&Ds, 01/28/13, 02/06/13, 02/15/13, 02/25/13) treated with vancomycin via PICC until 03/29/13. Patient presented with sepsis due to C. Diff colitis and in afib with RVR.  [facility] hospital course included IV flagyl, po vanc, IV KCL, cardizem drip, amio drip, and IV lopressor. Lab workup revealed a WBC 73K>>168K, positive C. Diff colitis from splenic flexure to rectum per CT abd/pelvis.  She is DNR admitted to [facility] for care.  PAST MEDICAL HISTORY :  Past Medical History  Diagnosis Date  . Mixed hyperlipidemia   . Hypothyroidism   . GERD (gastroesophageal reflux disease)   . Atrial fibrillation     Remote amiodarone use, recurred in 07/2011 post op from knee arthroplasty  . Obese   . History of blood transfusion 08/31/2011    A+ type  . History of GI bleed     Gastritis, was on Xarelto and ASA  . Carpal tunnel syndrome   . Arthritis     Knees  . DJD (degenerative joint disease),  thoracolumbar   . Rheumatoid arthritis   . H/O bronchitis   . Hemorrhoids   . Measles     as child  . Mumps     as child  . Carotid artery disease   . PONV (postoperative nausea and vomiting)   . Anemia   . Difficulty sleeping   . Multiple bruises     DUE TO FALL 01/24/13   Past Surgical History  Procedure Laterality Date  . Tonsillectomy    . Cholecystectomy  04/29/2005    Dr. Luretha Murphy  . Thoracotomy  4/07    Removal bronchogenic cyst  . Total knee arthroplasty  08/10/2011    Procedure: TOTAL KNEE ARTHROPLASTY;  Surgeon: Loanne Drilling, MD;  Location: [facility];  Service: Orthopedics;  Laterality: Left;  . Rotator cuff repair  1992, 1993    Bilateral  . Tubal ligation      Bilateral  . Ventral hernia repair  05/25/2005    Incarcerated, lysis of adhesions, Dr. Luretha Murphy  . Appendectomy      as child  . Bronchogenic cyst  2007  . Total knee arthroplasty Right 10/08/2012    Procedure: RIGHT TOTAL KNEE ARTHROPLASTY;  Surgeon: Loanne Drilling, MD;  Location: [facility];  Service: Orthopedics;  Laterality: Right;  . Irrigation and debridement knee Right 10/11/2012    Procedure: IRRIGATION AND DEBRIDEMENT KNEE;  Surgeon: Loanne Drilling, MD;  Location: [facility];  Service: Orthopedics;  Laterality: Right;  . Irrigation and debridement  knee Right 12/12/2012    Procedure: RIGHT KNEE IRRIGATION AND DEBRIDEMENT, APPLICATION OF WOUND VAC;  Surgeon: Gearlean Alf, MD;  Location: [facility];  Service: Orthopedics;  Laterality: Right;  . Joint replacement    . Irrigation and debridement knee Right 01/28/2013    Procedure: INCISION AND DRAINAGE RIGHT KNEE  RESECTION ARTHROPLASTY ANTIBIOTIC CEMENT SPACER   ;  Surgeon: Gearlean Alf, MD;  Location: [facility];  Service: Orthopedics;  Laterality: Right;  . Irrigation and debridement knee Right 02/06/2013    Procedure: IRRIGATION AND DEBRIDEMENT RIGHT KNEE;  Surgeon: Gearlean Alf, MD;  Location: [facility];  Service: Orthopedics;  Laterality: Right;  .  Excisional total knee arthroplasty with antibiotic spacers Right 02/15/2013    Procedure: IRRIGATION AND DEBRIDIMENT RIGHT KNEE  WITH EXCHANGE OF  ANTIBIOTIC SPACERS;  Surgeon: Gearlean Alf, MD;  Location: [facility];  Service: Orthopedics;  Laterality: Right;  . Excisional total knee arthroplasty with antibiotic spacers Right 02/25/2013    Procedure: IRRIGATION AND DEBRIDIMENT  RIGHT KNEE AND REMOVAL AND REPLACEMENT OF ANTIBIOTIC SPACERS RIGHT KNEE;  Surgeon: Gearlean Alf, MD;  Location: [facility];  Service: Orthopedics;  Laterality: Right;   Prior to Admission medications   Medication Sig Start Date End Date Taking? Authorizing Provider  acetaminophen (TYLENOL) 325 MG tablet Take 2 tablets (650 mg total) by mouth every 6 (six) hours as needed for mild pain (or Fever >/= 101). 03/04/13   Alexzandrew Dara Lords, PA-C  ALPRAZolam Duanne Moron) 0.5 MG tablet Take 0.5-1 mg by mouth 2 (two) times daily. Take one tablet in the morning and two tablets in the evening.    Historical Provider, MD  aspirin EC 81 MG tablet Take 81 mg by mouth every morning.    Historical Provider, MD  bisacodyl (DULCOLAX) 10 MG suppository Place 1 suppository (10 mg total) rectally daily as needed for moderate constipation. 03/04/13   Alexzandrew Perkins, PA-C  docusate sodium (COLACE) 100 MG capsule Take 100 mg by mouth every morning.     Historical Provider, MD  esomeprazole (NEXIUM) 40 MG capsule Take 40 mg by mouth every morning.  06/26/12   Gearlean Alf, PA-C  feeding supplement, ENSURE COMPLETE, (ENSURE COMPLETE) LIQD Take 237 mLs by mouth 3 (three) times daily with meals. 03/04/13   Alexzandrew Dara Lords, PA-C  ferrous sulfate 325 (65 FE) MG tablet Take 325 mg by mouth 2 (two) times daily.     Historical Provider, MD  gabapentin (NEURONTIN) 300 MG capsule Take 300 mg by mouth 2 (two) times daily. 06/26/12   Gearlean Alf, PA-C  HYDROcodone-acetaminophen (NORCO/VICODIN) 5-325 MG per tablet Take 1-2 tablets by mouth every 4 (four) hours as  needed (moderate to severe pain). 03/04/13   Alexzandrew Dara Lords, PA-C  levothyroxine (SYNTHROID, LEVOTHROID) 150 MCG tablet Take 1 tablet (150 mcg total) by mouth daily before breakfast. 01/16/13   Chipper Herb, MD  methocarbamol (ROBAXIN) 500 MG tablet Take 1 tablet (500 mg total) by mouth every 6 (six) hours as needed (For muscle spasms.). 03/04/13   Alexzandrew Dara Lords, PA-C  metoCLOPramide (REGLAN) 5 MG tablet Take 1-2 tablets (5-10 mg total) by mouth every 8 (eight) hours as needed for nausea (if ondansetron (ZOFRAN) ineffective.). 03/04/13   Alexzandrew Perkins, PA-C  ondansetron (ZOFRAN) 4 MG tablet Take 1 tablet (4 mg total) by mouth every 6 (six) hours as needed for nausea. 10/12/12   Alexzandrew Perkins, PA-C  polyethylene glycol (MIRALAX / GLYCOLAX) packet Take 17 g by mouth daily as  needed for mild constipation. 03/04/13   Alexzandrew Dara Lords, PA-C  promethazine (PHENERGAN) 25 MG tablet Take 0.5-1 tablets (12.5-25 mg total) by mouth every 6 (six) hours as needed for nausea. 10/12/12   Alexzandrew Dara Lords, PA-C  simvastatin (ZOCOR) 20 MG tablet Take 20 mg by mouth every morning. 06/26/12   Gearlean Alf, PA-C  tolterodine (DETROL LA) 4 MG 24 hr capsule Take 4 mg by mouth every morning.    Historical Provider, MD  traMADol (ULTRAM) 50 MG tablet Take 1 tablet (50 mg total) by mouth every 6 (six) hours as needed (mild pain). 03/04/13   Alexzandrew Dara Lords, PA-C  Vancomycin (VANCOCIN) 750 MG/150ML SOLN Inject 150 mLs (750 mg total) into the vein every 12 (twelve) hours. IV Vancomycin for 50 more doses to complete a six week course of IV ABX via PICC Line. 03/04/13   Alexzandrew Dara Lords, PA-C   Allergies  Allergen Reactions  . Omeprazole Nausea And Vomiting  . Penicillins Itching  . Morphine Rash    And hot flashes  . Xarelto [Rivaroxaban] Other (See Comments)    Bleeding,spitting up blood    FAMILY HISTORY:  Family History  Problem Relation Age of Onset  . Cancer    . Stroke    . Diabetes     . Hypertension    . Colon cancer Neg Hx   . Colon polyps Neg Hx   . Rectal cancer Neg Hx   . Stomach cancer Neg Hx    SOCIAL HISTORY:  reports that she has never smoked. She has never used smokeless tobacco. She reports that she does not drink alcohol or use illicit drugs.  REVIEW OF SYSTEMS:  General ROS: positive for  - none  negative for - chills, fever or night sweats Psychological ROS: positive for - none negative for - anxiety or irritability Hematological and Lymphatic ROS: positive for - none negative for - night sweats or pallor Endocrine ROS: positive for - none negative for - palpitations or unexpected weight changes Respiratory ROS: no cough, shortness of breath, or wheezing Cardiovascular ROS: no chest pain or dyspnea on exertion Gastrointestinal ROS: positive for - abdominal pain and diarrhea negative for - nausea/vomiting Genito-Urinary ROS: positive for - none negative for - dysuria Musculoskeletal ROS: negative Neurological ROS: no TIA or stroke symptoms Dermatological ROS: positive for warm right knee negative for dry skin and pruritus   SUBJECTIVE:   VITAL SIGNS: Temp:  [97.6 F (36.4 C)] 97.6 F (36.4 C) (01/19 1318) Pulse Rate:  [104-111] 111 (01/19 1318) Resp:  [25-26] 26 (01/19 1318) BP: (97)/(66) 97/66 mmHg (01/19 1300) SpO2:  [95 %-96 %] 95 % (01/19 1318) Weight:  [69 kg (152 lb 1.9 oz)] 69 kg (152 lb 1.9 oz) (01/19 1318) HEMODYNAMICS:   VENTILATOR SETTINGS:   INTAKE / OUTPUT: Intake/Output   None    PHYSICAL EXAMINATION: General appearance - alert, well appearing, and in no distress Mental status - alert, oriented to person, place, and time Eyes - pupils equal and reactive, extraocular eye movements intact Mouth - mucous membranes moist, pharynx normal without lesions Chest - clear to auscultation, no wheezes, rales or rhonchi, symmetric air entry, poor effort Heart - normal rate, regular rhythm, normal S1, S2, no murmurs, rubs,  clicks or gallops Abdomen - no abdominal bruits, BS present, TTP diffusely throughout Neurological - alert, oriented, normal speech, no focal findings or movement disorder noted Musculoskeletal - swollen R knee, ROM limited Extremities - peripheral pulses normal, no pedal edema, no clubbing  or cyanosis Skin - R knee with calor, tumor, mild tenderness, edema  LABS:  CBC No results found for this basename: WBC, HGB, HCT, PLT,  in the last 168 hours Coag's No results found for this basename: APTT, INR,  in the last 168 hours BMET No results found for this basename: NA, K, CL, CO2, BUN, CREATININE, GLUCOSE,  in the last 168 hours Electrolytes No results found for this basename: CALCIUM, MG, PHOS,  in the last 168 hours Sepsis Markers No results found for this basename: LATICACIDVEN, PROCALCITON, O2SATVEN,  in the last 168 hours ABG No results found for this basename: PHART, PCO2ART, PO2ART,  in the last 168 hours Liver Enzymes No results found for this basename: AST, ALT, ALKPHOS, BILITOT, ALBUMIN,  in the last 168 hours Cardiac Enzymes No results found for this basename: TROPONINI, PROBNP,  in the last 168 hours Glucose No results found for this basename: GLUCAP,  in the last 168 hours  Imaging No results found.  CXR: none  ASSESSMENT / PLAN:  PULMONARY A: Hx bronchitis No active issues P:   O2 support to maintain >92%  CARDIOVASCULAR A:  Uncontrolled afib CHF (EF unknown) Hypotension P:  Continue NS @ 75 Needs repeat Echo Continue amio, diltiazem -keep rate 120 or lower Check troponin  RENAL A:   No hx renal disease Hypokalemia per [facility] records P:   Follow electrolytes, replete as needed Check Mg, repletePhos  GASTROINTESTINAL A:  C. Diff colitis Edematous left colon GERD Hx GIB P:   Abx per ID No indication for PPI ppx Flat abd xray --completed >>> Colonic ileus with edema in the left colon. Negative for bowel obstruction. Continue heart diet  (?ileus) Consider NGT for decompression  HEMATOLOGIC A:  Leukemoid reaction Anemia (baseline Hgb ~9) P:  Follow CBC Check PT/INR, PTT Consider FOBT if Hgb falls Lovenox for DVT ppx Consider heme consult to review PS for blasts if Wc remains this high  INFECTIOUS A:   Multiple I&Ds to R TKA Hx enterococcus wound infection C. Diff colitis Leukocytosis (168,000 @ [facility]), afebrile P:   Vanc PO, Vanc IV, Flagyl IV per Rx ID consult Trend fever curve Follow CBC w/ diff Check lactic acid  ENDOCRINE A:   Hypothyroid P:   Check TSH  NEUROLOGIC A:   No active issues P:   Will follow  TODAY'S SUMMARY: Admitted to PCCM, ID consult obtained, will follow WBC count  I have personally obtained a history, examined the patient, evaluated laboratory and imaging results, formulated the assessment and plan and placed orders. CRITICAL CARE: The patient is critically ill with multiple organ systems failure and requires high complexity decision making for assessment and support, frequent evaluation and titration of therapies, application of advanced monitoring technologies and extensive interpretation of multiple databases. Critical Care Time devoted to patient care services described in this note is 60 minutes.   Rigoberto Noel  Pulmonary and [place] Pager: [phone]  04/21/2013, 1:59 PM

## 2013-04-15 NOTE — Progress Notes (Signed)
04/01/2013 4:07 PM Dr. Nelda Marseille notified by Luellen Pucker RN at e-link of WBC count 182.0  Caitlyn Buchanan, Carolynn Comment

## 2013-04-16 ENCOUNTER — Inpatient Hospital Stay (HOSPITAL_COMMUNITY): Payer: Medicare Other

## 2013-04-16 ENCOUNTER — Inpatient Hospital Stay (HOSPITAL_COMMUNITY): Payer: Medicare Other | Admitting: Anesthesiology

## 2013-04-16 ENCOUNTER — Encounter (HOSPITAL_COMMUNITY): Payer: Self-pay | Admitting: *Deleted

## 2013-04-16 ENCOUNTER — Encounter (HOSPITAL_COMMUNITY): Payer: Self-pay | Admitting: Anesthesiology

## 2013-04-16 ENCOUNTER — Encounter (HOSPITAL_COMMUNITY): Admission: AD | Disposition: E | Payer: Self-pay | Source: Other Acute Inpatient Hospital | Attending: Pulmonary Disease

## 2013-04-16 ENCOUNTER — Encounter (HOSPITAL_COMMUNITY): Payer: Medicare Other | Admitting: Anesthesiology

## 2013-04-16 DIAGNOSIS — A0472 Enterocolitis due to Clostridium difficile, not specified as recurrent: Secondary | ICD-10-CM

## 2013-04-16 DIAGNOSIS — E871 Hypo-osmolality and hyponatremia: Secondary | ICD-10-CM

## 2013-04-16 DIAGNOSIS — K5190 Ulcerative colitis, unspecified, without complications: Secondary | ICD-10-CM

## 2013-04-16 DIAGNOSIS — D72829 Elevated white blood cell count, unspecified: Secondary | ICD-10-CM

## 2013-04-16 DIAGNOSIS — A409 Streptococcal sepsis, unspecified: Secondary | ICD-10-CM

## 2013-04-16 DIAGNOSIS — A419 Sepsis, unspecified organism: Secondary | ICD-10-CM

## 2013-04-16 DIAGNOSIS — Y849 Medical procedure, unspecified as the cause of abnormal reaction of the patient, or of later complication, without mention of misadventure at the time of the procedure: Secondary | ICD-10-CM

## 2013-04-16 DIAGNOSIS — T8450XA Infection and inflammatory reaction due to unspecified internal joint prosthesis, initial encounter: Secondary | ICD-10-CM

## 2013-04-16 HISTORY — PX: COLON RESECTION: SHX5231

## 2013-04-16 LAB — CBC
HCT: 35.3 % — ABNORMAL LOW (ref 36.0–46.0)
HEMOGLOBIN: 12.2 g/dL (ref 12.0–15.0)
MCH: 30.2 pg (ref 26.0–34.0)
MCHC: 34.6 g/dL (ref 30.0–36.0)
MCV: 87.4 fL (ref 78.0–100.0)
PLATELETS: 428 10*3/uL — AB (ref 150–400)
RBC: 4.04 MIL/uL (ref 3.87–5.11)
RDW: 16.4 % — AB (ref 11.5–15.5)
WBC: 186.3 10*3/uL (ref 4.0–10.5)

## 2013-04-16 LAB — CBC WITH DIFFERENTIAL/PLATELET
BAND NEUTROPHILS: 0 % (ref 0–10)
BLASTS: 0 %
Basophils Absolute: 0 10*3/uL (ref 0.0–0.1)
Basophils Relative: 0 % (ref 0–1)
EOS PCT: 0 % (ref 0–5)
Eosinophils Absolute: 0 10*3/uL (ref 0.0–0.7)
HEMATOCRIT: 17.7 % — AB (ref 36.0–46.0)
HEMOGLOBIN: 6 g/dL — AB (ref 12.0–15.0)
LYMPHS ABS: 3.9 10*3/uL (ref 0.7–4.0)
LYMPHS PCT: 3 % — AB (ref 12–46)
MCH: 29.7 pg (ref 26.0–34.0)
MCHC: 33.9 g/dL (ref 30.0–36.0)
MCV: 87.6 fL (ref 78.0–100.0)
MONO ABS: 2.6 10*3/uL — AB (ref 0.1–1.0)
MONOS PCT: 2 % — AB (ref 3–12)
Metamyelocytes Relative: 0 %
Myelocytes: 0 %
Neutro Abs: 124.5 10*3/uL — ABNORMAL HIGH (ref 1.7–7.7)
Neutrophils Relative %: 95 % — ABNORMAL HIGH (ref 43–77)
Platelets: 302 10*3/uL (ref 150–400)
Promyelocytes Absolute: 0 %
RBC: 2.02 MIL/uL — AB (ref 3.87–5.11)
RDW: 16.8 % — AB (ref 11.5–15.5)
WBC: 131 10*3/uL — AB (ref 4.0–10.5)
nRBC: 0 /100 WBC

## 2013-04-16 LAB — BASIC METABOLIC PANEL
BUN: 27 mg/dL — ABNORMAL HIGH (ref 6–23)
BUN: 28 mg/dL — ABNORMAL HIGH (ref 6–23)
CALCIUM: 7.7 mg/dL — AB (ref 8.4–10.5)
CO2: 17 mEq/L — ABNORMAL LOW (ref 19–32)
CO2: 13 meq/L — AB (ref 19–32)
CREATININE: 1.2 mg/dL — AB (ref 0.50–1.10)
Calcium: 5.8 mg/dL — CL (ref 8.4–10.5)
Chloride: 91 mEq/L — ABNORMAL LOW (ref 96–112)
Chloride: 95 mEq/L — ABNORMAL LOW (ref 96–112)
Creatinine, Ser: 1.02 mg/dL (ref 0.50–1.10)
GFR calc Af Amer: 59 mL/min — ABNORMAL LOW (ref >90)
GFR calc Af Amer: 48 mL/min — ABNORMAL LOW (ref >90)
GFR calc non Af Amer: 42 mL/min — ABNORMAL LOW (ref >90)
GFR, EST NON AFRICAN AMERICAN: 51 mL/min — AB (ref >90)
GLUCOSE: 124 mg/dL — AB (ref 70–99)
Glucose, Bld: 313 mg/dL — ABNORMAL HIGH (ref 70–99)
POTASSIUM: 4.7 meq/L (ref 3.7–5.3)
Potassium: 4.5 mEq/L (ref 3.7–5.3)
SODIUM: 122 meq/L — AB (ref 137–147)
SODIUM: 124 meq/L — AB (ref 137–147)

## 2013-04-16 LAB — POCT I-STAT 3, ART BLOOD GAS (G3+)
Acid-base deficit: 12 mmol/L — ABNORMAL HIGH (ref 0.0–2.0)
Acid-base deficit: 16 mmol/L — ABNORMAL HIGH (ref 0.0–2.0)
BICARBONATE: 14.8 meq/L — AB (ref 20.0–24.0)
BICARBONATE: 10.6 meq/L — AB (ref 20.0–24.0)
O2 Saturation: 100 %
O2 Saturation: 99 %
PH ART: 7.2 — AB (ref 7.350–7.450)
PH ART: 7.211 — AB (ref 7.350–7.450)
PO2 ART: 158 mmHg — AB (ref 80.0–100.0)
Patient temperature: 98.6
Patient temperature: 95
TCO2: 16 mmol/L (ref 0–100)
TCO2: 11 mmol/L (ref 0–100)
pCO2 arterial: 37.8 mmHg (ref 35.0–45.0)
pCO2 arterial: 25.7 mmHg — ABNORMAL LOW (ref 35.0–45.0)
pO2, Arterial: 209 mmHg — ABNORMAL HIGH (ref 80.0–100.0)

## 2013-04-16 LAB — PROTIME-INR
INR: 2.71 — AB (ref 0.00–1.49)
Prothrombin Time: 27.8 seconds — ABNORMAL HIGH (ref 11.6–15.2)

## 2013-04-16 LAB — URINE CULTURE
COLONY COUNT: NO GROWTH
Culture: NO GROWTH

## 2013-04-16 LAB — PATHOLOGIST SMEAR REVIEW

## 2013-04-16 LAB — LACTIC ACID, PLASMA: LACTIC ACID, VENOUS: 7 mmol/L — AB (ref 0.5–2.2)

## 2013-04-16 SURGERY — COLON RESECTION
Anesthesia: General

## 2013-04-16 MED ORDER — PHENYLEPHRINE HCL 10 MG/ML IJ SOLN
10.0000 mg | INTRAVENOUS | Status: DC | PRN
  Administered 2013-04-16: 50 ug/min via INTRAVENOUS

## 2013-04-16 MED ORDER — LACTATED RINGERS IV SOLN
INTRAVENOUS | Status: DC | PRN
  Administered 2013-04-16 (×3): via INTRAVENOUS

## 2013-04-16 MED ORDER — LIDOCAINE HCL (CARDIAC) 20 MG/ML IV SOLN
INTRAVENOUS | Status: DC | PRN
  Administered 2013-04-16: 100 mg via INTRAVENOUS

## 2013-04-16 MED ORDER — ROCURONIUM BROMIDE 100 MG/10ML IV SOLN
INTRAVENOUS | Status: DC | PRN
  Administered 2013-04-16 (×2): 50 mg via INTRAVENOUS

## 2013-04-16 MED ORDER — ONDANSETRON HCL 4 MG/2ML IJ SOLN: 4.0000 mg | Freq: Four times a day (QID) | INTRAMUSCULAR | Status: DC | PRN

## 2013-04-16 MED ORDER — 0.9 % SODIUM CHLORIDE (POUR BTL) OPTIME
TOPICAL | Status: DC | PRN
  Administered 2013-04-16 (×3): 1000 mL

## 2013-04-16 MED ORDER — ARTIFICIAL TEARS OP OINT
TOPICAL_OINTMENT | OPHTHALMIC | Status: DC | PRN
  Administered 2013-04-16: 1 via OPHTHALMIC

## 2013-04-16 MED ORDER — SUCCINYLCHOLINE CHLORIDE 20 MG/ML IJ SOLN
INTRAMUSCULAR | Status: DC | PRN
  Administered 2013-04-16: 100 mg via INTRAVENOUS

## 2013-04-16 MED ORDER — DILTIAZEM HCL 100 MG IV SOLR
5.0000 mg/h | INTRAVENOUS | Status: DC
  Filled 2013-04-16: qty 100

## 2013-04-16 MED ORDER — AMIODARONE HCL IN DEXTROSE 360-4.14 MG/200ML-% IV SOLN
60.0000 mg/h | INTRAVENOUS | Status: DC
  Filled 2013-04-16: qty 200

## 2013-04-16 MED ORDER — PHENYLEPHRINE HCL 10 MG/ML IJ SOLN
30.0000 ug/min | INTRAVENOUS | Status: DC
  Filled 2013-04-16: qty 1

## 2013-04-16 MED ORDER — HEMOSTATIC AGENTS (NO CHARGE) OPTIME
TOPICAL | Status: DC | PRN
  Administered 2013-04-16 (×2): 1 via TOPICAL

## 2013-04-16 MED ORDER — PROPOFOL 10 MG/ML IV BOLUS
INTRAVENOUS | Status: DC | PRN
  Administered 2013-04-16: 60 mg via INTRAVENOUS

## 2013-04-16 MED ORDER — SODIUM CHLORIDE 0.9 % IV BOLUS (SEPSIS)
1000.0000 mL | Freq: Once | INTRAVENOUS | Status: AC
  Administered 2013-04-16: 1000 mL via INTRAVENOUS

## 2013-04-16 MED ORDER — STERILE WATER FOR INJECTION IV SOLN
INTRAVENOUS | Status: DC
  Filled 2013-04-16: qty 850

## 2013-04-16 MED ORDER — PHENYLEPHRINE HCL 10 MG/ML IJ SOLN
30.0000 ug/min | INTRAMUSCULAR | Status: DC
  Administered 2013-04-16: 200 ug/min via INTRAVENOUS
  Filled 2013-04-16: qty 4

## 2013-04-16 MED ORDER — VASOPRESSIN 20 UNIT/ML IJ SOLN
0.0300 [IU]/min | INTRAVENOUS | Status: DC
  Administered 2013-04-16: 0.03 [IU]/min via INTRAVENOUS
  Filled 2013-04-16 (×2): qty 2.5

## 2013-04-16 MED ORDER — SODIUM CHLORIDE 0.9 % IV SOLN
INTRAVENOUS | Status: DC
  Administered 2013-04-16: 20:00:00 via INTRAVENOUS

## 2013-04-16 MED ORDER — BIOTENE DRY MOUTH MT LIQD: 15.0000 mL | Freq: Four times a day (QID) | OROMUCOSAL | Status: DC

## 2013-04-16 MED ORDER — MIDAZOLAM HCL 2 MG/2ML IJ SOLN: 1.0000 mg | INTRAMUSCULAR | Status: DC | PRN

## 2013-04-16 MED ORDER — PHENYLEPHRINE HCL 10 MG/ML IJ SOLN
30.0000 ug/min | INTRAVENOUS | Status: DC
  Administered 2013-04-16: 200 ug/min via INTRAVENOUS
  Filled 2013-04-16: qty 1

## 2013-04-16 MED ORDER — FENTANYL CITRATE 0.05 MG/ML IJ SOLN: 25.0000 ug | INTRAMUSCULAR | Status: DC | PRN

## 2013-04-16 MED ORDER — ESMOLOL HCL 10 MG/ML IV SOLN
INTRAVENOUS | Status: DC | PRN
  Administered 2013-04-16: 10 mg via INTRAVENOUS

## 2013-04-16 MED ORDER — LACTATED RINGERS IV SOLN: INTRAVENOUS | Status: DC

## 2013-04-16 MED ORDER — FAMOTIDINE IN NACL 20-0.9 MG/50ML-% IV SOLN
20.0000 mg | Freq: Two times a day (BID) | INTRAVENOUS | Status: DC
  Administered 2013-04-16: 20 mg via INTRAVENOUS
  Filled 2013-04-16 (×3): qty 50

## 2013-04-16 MED ORDER — SODIUM CHLORIDE 0.9 % IV BOLUS (SEPSIS)
500.0000 mL | Freq: Once | INTRAVENOUS | Status: AC
  Administered 2013-04-16: 500 mL via INTRAVENOUS

## 2013-04-16 MED ORDER — FENTANYL CITRATE 0.05 MG/ML IJ SOLN
INTRAMUSCULAR | Status: DC | PRN
  Administered 2013-04-16 (×2): 100 ug via INTRAVENOUS
  Administered 2013-04-16: 50 ug via INTRAVENOUS

## 2013-04-16 MED ORDER — HYDROCORTISONE SOD SUCCINATE 100 MG IJ SOLR
50.0000 mg | Freq: Four times a day (QID) | INTRAMUSCULAR | Status: DC
  Filled 2013-04-16 (×3): qty 1

## 2013-04-16 MED ORDER — CHLORHEXIDINE GLUCONATE 0.12 % MT SOLN
15.0000 mL | Freq: Two times a day (BID) | OROMUCOSAL | Status: DC
  Administered 2013-04-16: 15 mL via OROMUCOSAL
  Filled 2013-04-16: qty 15

## 2013-04-16 MED ORDER — MORPHINE SULFATE 4 MG/ML IJ SOLN: 4.0000 mg | INTRAMUSCULAR | Status: DC | PRN

## 2013-04-16 SURGICAL SUPPLY — 69 items
BANDAGE GAUZE ELAST BULKY 4 IN (GAUZE/BANDAGES/DRESSINGS) ×2 IMPLANT
BLADE SURG ROTATE 9660 (MISCELLANEOUS) IMPLANT
CANISTER SUCTION 2500CC (MISCELLANEOUS) ×3 IMPLANT
CHLORAPREP W/TINT 26ML (MISCELLANEOUS) ×3 IMPLANT
COVER MAYO STAND STRL (DRAPES) ×6 IMPLANT
COVER SURGICAL LIGHT HANDLE (MISCELLANEOUS) ×3 IMPLANT
DRAPE LAPAROSCOPIC ABDOMINAL (DRAPES) ×3 IMPLANT
DRAPE PROXIMA HALF (DRAPES) ×6 IMPLANT
DRAPE UTILITY 15X26 W/TAPE STR (DRAPE) ×15 IMPLANT
DRAPE WARM FLUID 44X44 (DRAPE) ×3 IMPLANT
DRSG OPSITE POSTOP 4X10 (GAUZE/BANDAGES/DRESSINGS) IMPLANT
DRSG OPSITE POSTOP 4X8 (GAUZE/BANDAGES/DRESSINGS) IMPLANT
ELECT BLADE 6.5 EXT (BLADE) ×5 IMPLANT
ELECT CAUTERY BLADE 6.4 (BLADE) ×6 IMPLANT
ELECT REM PT RETURN 9FT ADLT (ELECTROSURGICAL) ×3
ELECTRODE REM PT RTRN 9FT ADLT (ELECTROSURGICAL) ×1 IMPLANT
GLOVE BIO SURGEON STRL SZ 6 (GLOVE) ×2 IMPLANT
GLOVE BIOGEL PI IND STRL 6 (GLOVE) IMPLANT
GLOVE BIOGEL PI IND STRL 7.0 (GLOVE) IMPLANT
GLOVE BIOGEL PI IND STRL 7.5 (GLOVE) IMPLANT
GLOVE BIOGEL PI IND STRL 8 (GLOVE) IMPLANT
GLOVE BIOGEL PI INDICATOR 6 (GLOVE) ×2
GLOVE BIOGEL PI INDICATOR 7.0 (GLOVE) ×6
GLOVE BIOGEL PI INDICATOR 7.5 (GLOVE) ×2
GLOVE BIOGEL PI INDICATOR 8 (GLOVE) ×4
GLOVE ECLIPSE 8.0 STRL XLNG CF (GLOVE) ×4 IMPLANT
GLOVE SURG SIGNA 7.5 PF LTX (GLOVE) ×6 IMPLANT
GOWN STRL NON-REIN LRG LVL3 (GOWN DISPOSABLE) ×16 IMPLANT
GOWN STRL REIN XL XLG (GOWN DISPOSABLE) ×6 IMPLANT
GOWN STRL REUS W/ TWL XL LVL3 (GOWN DISPOSABLE) IMPLANT
GOWN STRL REUS W/TWL XL LVL3 (GOWN DISPOSABLE) ×6
HEMOSTAT SNOW SURGICEL 2X4 (HEMOSTASIS) ×4 IMPLANT
KIT BASIN OR (CUSTOM PROCEDURE TRAY) ×3 IMPLANT
KIT OSTOMY DRAINABLE 2.75 STR (WOUND CARE) ×2 IMPLANT
KIT ROOM TURNOVER OR (KITS) ×3 IMPLANT
LEGGING LITHOTOMY PAIR STRL (DRAPES) IMPLANT
LIGASURE IMPACT 36 18CM CVD LR (INSTRUMENTS) ×2 IMPLANT
NS IRRIG 1000ML POUR BTL (IV SOLUTION) ×12 IMPLANT
PACK GENERAL/GYN (CUSTOM PROCEDURE TRAY) ×3 IMPLANT
PAD ABD 8X10 STRL (GAUZE/BANDAGES/DRESSINGS) ×2 IMPLANT
PAD ARMBOARD 7.5X6 YLW CONV (MISCELLANEOUS) ×3 IMPLANT
PENCIL BUTTON HOLSTER BLD 10FT (ELECTRODE) ×3 IMPLANT
SPECIMEN JAR X LARGE (MISCELLANEOUS) ×3 IMPLANT
SPONGE GAUZE 4X4 12PLY STER LF (GAUZE/BANDAGES/DRESSINGS) ×2 IMPLANT
SPONGE LAP 18X18 X RAY DECT (DISPOSABLE) ×8 IMPLANT
STAPLER CUT CVD 40MM BLUE (STAPLE) ×2 IMPLANT
STAPLER PROXIMATE 75MM BLUE (STAPLE) ×2 IMPLANT
STAPLER VISISTAT 35W (STAPLE) ×3 IMPLANT
SUCTION POOLE TIP (SUCTIONS) ×3 IMPLANT
SURGILUBE 2OZ TUBE FLIPTOP (MISCELLANEOUS) IMPLANT
SUT NOVA NAB DX-16 0-1 5-0 T12 (SUTURE) ×4 IMPLANT
SUT PDS AB 1 TP1 96 (SUTURE) ×6 IMPLANT
SUT PROLENE 2 0 CT2 30 (SUTURE) IMPLANT
SUT PROLENE 2 0 KS (SUTURE) IMPLANT
SUT SILK 2 0 SH CR/8 (SUTURE) ×3 IMPLANT
SUT SILK 2 0 TIES 10X30 (SUTURE) ×3 IMPLANT
SUT SILK 3 0 SH CR/8 (SUTURE) ×3 IMPLANT
SUT SILK 3 0 TIES 10X30 (SUTURE) ×3 IMPLANT
SUT VIC AB 3-0 SH 8-18 (SUTURE) ×2 IMPLANT
SYR BULB IRRIGATION 50ML (SYRINGE) ×3 IMPLANT
TAPE CLOTH SURG 4X10 WHT LF (GAUZE/BANDAGES/DRESSINGS) ×2 IMPLANT
TOWEL OR 17X24 6PK STRL BLUE (TOWEL DISPOSABLE) ×2 IMPLANT
TOWEL OR 17X26 10 PK STRL BLUE (TOWEL DISPOSABLE) ×6 IMPLANT
TRAY FOLEY CATH 14FRSI W/METER (CATHETERS) IMPLANT
TRAY PROCTOSCOPIC FIBER OPTIC (SET/KITS/TRAYS/PACK) IMPLANT
TUBE CONNECTING 12'X1/4 (SUCTIONS) ×1
TUBE CONNECTING 12X1/4 (SUCTIONS) ×2 IMPLANT
WATER STERILE IRR 1000ML POUR (IV SOLUTION) IMPLANT
YANKAUER SUCT BULB TIP NO VENT (SUCTIONS) ×3 IMPLANT

## 2013-04-16 NOTE — Progress Notes (Signed)
Subjective: Patient still with GI complaints. No complaints related to knee   Objective: Vital signs in last 24 hours: Temp:  [97.4 F (36.3 C)-97.7 F (36.5 C)] 97.7 F (36.5 C) (01/20 0400) Pulse Rate:  [52-156] 120 (01/20 0700) Resp:  [21-34] 27 (01/20 0700) BP: (88-148)/(61-105) 128/78 mmHg (01/20 0700) SpO2:  [93 %-97 %] 95 % (01/20 0700) Weight:  [152 lb 1.9 oz (69 kg)-156 lb 1.4 oz (70.8 kg)] 156 lb 1.4 oz (70.8 kg) (01/20 0500)  Intake/Output from previous day: 01/19 0701 - 01/20 0700 In: 2718.7 [P.O.:600; I.[name].:1768.7; IV Piggyback:350] Out: 150 [Urine:150] Intake/Output this shift:     Recent Labs  04/02/2013 1434 04/15/2013 0248  HGB 12.5 12.2    Recent Labs  04/05/2013 1434  0248  WBC 182.0* 186.3*  RBC 4.17 4.04  HCT 36.0 35.3*  PLT 423* 428*    Recent Labs  04/24/2013 1434 04/12/2013 0248  NA 126* 122*  K 4.7 4.7  CL 92* 91*  CO2 19 17*  BUN 21 27*  CREATININE 0.89 1.02  GLUCOSE 127* 124*  CALCIUM 7.8* 7.7*    Recent Labs  04/01/2013 1434  INR 1.49    No cellulitis present Compartment soft Right knee- No drainage. Minimal swelling. This is actually the best her knee has looked in months.  Assessment/Plan: Right knee septic arthritis- Knee is stable at this time. Wound looks better now than at any point in the past 6 months. Patient has an antibiotic spacer in her knee thus should remain in full extension and not be moved. Will eventually have knee fused once other issues stabilize/resolve   [name] [name] 04/20/2013, 7:37 AM

## 2013-04-16 NOTE — Progress Notes (Signed)
Clinical Social Work Department BRIEF PSYCHOSOCIAL ASSESSMENT 04/02/2013  Patient:  [name] [name], [name] [name]     Account Number:  [id number]     Admit date:  04/13/2013  Clinical Social Worker:  Freeman Caldron  Date/Time:  03/30/2013 03:49 PM  Referred by:  Physician  Date Referred:  04/01/2013 Referred for  SNF Placement   Other Referral:   Interview type:  Patient Other interview type:   Pt's family in room as well (2 sons, and 3 extended family members/close family friends)    PSYCHOSOCIAL DATA Living Status:  FACILITY Admitted from facility:  [facility] Level of care:  [place] Primary support name:  [name] [name] ([phone] & [phone]) Primary support relationship to patient:  CHILD, ADULT Degree of support available:   Great--5 family members/friends in pt's room and provide support to pt. Pt from [facility].    CURRENT CONCERNS Current Concerns  Post-Acute Placement   Other Concerns:    SOCIAL WORK ASSESSMENT / PLAN CSW met with pt at bedside, and pt had many family/friends visiting. CSW verified pt is from [facility] and pt and her sons verified. Pt stated she was going to have surgery today, and CSW asked if pt was nervious/anxious. Pt states she was not, that she was ready to do whatever is needed to get better. Pt says she has felt sick for a long time, and she wants to feel better. CSW provided support and encouragement. Pt thanked CSW for coming to see her. CSW continues to follow case and will assist with discharge back to SNF when pt is medically ready, continuing to check on pt and provide support while she is hospitalized.   Assessment/plan status:  Psychosocial Support/Ongoing Assessment of Needs Other assessment/ plan:   Information/referral to community resources:   SNF [facility]).    PATIENT'S/FAMILY'S RESPONSE TO PLAN OF CARE: Good--pt thanked CSW for checking on her and providing support. Pt's family also thanked CSW  for assistance with discharge back to SNF. CSW will continue to provide support to pt while she is in the hospital on CSW's unit and will facilitate discharge back to [facility].       Ky Barban, MSW, [facility] Clinical Social Worker [phone]

## 2013-04-16 NOTE — Preoperative (Signed)
Beta Blockers   Reason not to administer Beta Blockers:Not Applicable 

## 2013-04-16 NOTE — Progress Notes (Signed)
[place] Progress Note Patient Name: [name] [name] DOB: [birth year] MRN: [id number]  Date of Service  04/09/2013   HPI/Events of Note   Patient is in refractory shock.  Will not be able to use any other agents other than neo due to severe tachycardia.  eICU Interventions  Will place limitation where neo does not increase beyond 200, continue vasopressin and fluid resuscitation.  Spoke with son and POA, will no longer increase drugs any further and change code status to LCB with intubation and pressors only with current as cap, no further increase.      YACOUB,WESAM , 7:20 PM

## 2013-04-16 NOTE — Progress Notes (Addendum)
Elink notified of decreased urine output.

## 2013-04-16 NOTE — Progress Notes (Signed)
I was consulted to see patient with elevated WBC Patient is currently in the OR Will return to see patient tomorrow

## 2013-04-16 NOTE — Progress Notes (Signed)
Patient ID: [name] [name], female   DOB: April 13, 1930, 80 y.o.   MRN: [id number]  Family agrees to proceed with surgery

## 2013-04-16 NOTE — Anesthesia Postprocedure Evaluation (Signed)
  Anesthesia Post-op Note  Patient: [name] [name]  Procedure(s) Performed: Procedure(s) with comments: Exp. Lap.; Partial Colectomy and Ostomy (N/A) - ileostomy  Patient Location: ICU  Anesthesia Type:General  Level of Consciousness: sedated and Patient remains intubated per anesthesia plan  Airway and Oxygen Therapy: Patient remains intubated per anesthesia plan and Patient placed on Ventilator (see vital sign flow sheet for setting)  Post-op Pain: none  Post-op Assessment: Post-op Vital signs reviewed, Patient's Cardiovascular Status Stable, Respiratory Function Stable, Patent Airway and Pain level controlled  Post-op Vital Signs: Reviewed and stable  Complications: No apparent anesthesia complications

## 2013-04-16 NOTE — Transfer of Care (Signed)
Immediate Anesthesia Transfer of Care Note  Patient: [name] [name]  Procedure(s) Performed: Procedure(s) with comments: Exp. Lap.; Partial Colectomy and Ostomy (N/A) - ileostomy  Patient Location: ICU  Anesthesia Type:General  Level of Consciousness: sedated and Patient remains intubated per anesthesia plan  Airway & Oxygen Therapy: Patient remains intubated per anesthesia plan and Patient placed on Ventilator (see vital sign flow sheet for setting)  Post-op Assessment: Report given to PACU RN  Post vital signs: Reviewed and stable  Complications: No apparent anesthesia complications

## 2013-04-16 NOTE — Progress Notes (Signed)
[place] Progress Note Patient Name: [name] [name] DOB: 01-30-1931 MRN: [id number]  Date of Service  04/26/2013   HPI/Events of Note   Called for refractory shock.  eICU Interventions  Stress dose steroids, cortisol, vasopressin, CVP and volume.      Cloyce Blankenhorn 04/22/2013, 6:11 PM

## 2013-04-16 NOTE — Progress Notes (Signed)
[place] Progress Note Patient Name: [name] [name] DOB: 26-Sep-1930 MRN: [id number]  Date of Service  04/22/2013   HPI/Events of Note   Intubated post op, hypotensive and tachycardic.  eICU Interventions  Vent setting, sedation orders, neo ordered, 1L NS bolus given with ABG and CXR ordered.      YACOUB,WESAM 04/15/2013, 5:30 PM

## 2013-04-16 NOTE — Consult Note (Signed)
[facility]  Total days of antibiotics 2        Day 2 iv metronidazole        Day 2 oral vanco        Day 2 iv vanco       Reason for Consult:severe cdifficile colitis    Referring Physician: alva  Active Problems:   Clostridium difficile colitis    HPI: [name] [name] is a 80 y.o. female known to the ID service for chronic prosthetic right knee joint infection s/p resection and antibiotic spacer implant just finishing 6 wks of IV vancomycin for group B strep and enterococcal infection last seen in the ID clinic on 04/09/13. Nyelah had a complicated hospitalization from 11/1- 03/05/13 initially due to ground level fall and sustained fractures. She had numerous surgeries for her infected right knee prosthesis:  #1 - 01/28/2013 - Right knee resection arthroplasty, antibiotic cement spacer placement. OR cultures isolated Group B Strep -> placed on ceftriaxone 2gm IV daily (pt has hx of PCN hives, but tolerated ctx without difficulty)  #2 - 02/06/2013 - Irrigation and debridement of right knee.  #3 - 02/15/2013 - Irrigation and debridement of right knee with exchange of antibiotic spacer. OR cultures isolated enterococcus (amp S) -> abtx changed to vancomycin x 6 wks  #4 - 02/25/2013 - Irrigation and debridement of right knee and exchange of antibiotic spacer.  She was discharged on 12/09 to SNF to be on vancomycin until January 2nd. Discharge records also looks like she was also placed on doxycycline in addn to vanco for wound dehiscence. Dr. Wynelle Link was planning to do a fusion sometime in January. She was doing well at last ID follow up appt on 04/09/13 with no complaint of diarrhea.  She was admitted to on 1/16 found to have severe sepsis, leukocytosis/leukamoid reaction in the 70-160K, worsening due to c.difficile colitis. Transferred to [facility] on 04/20/2013 for sepsis management. Labs revealed worsening leukocytosis of 186K, with left shift of 90% N. Creatinine function  stable previously 0.7 in dec now 1.0.  afib with RVR. abd xray revealing colonic ileus with edema. General surgery evaluated patient and felt she met criteria for colectomy as treatment to cdifficile sepsis and reviewed risks. Patient's family has consented to surgery.   Past Medical History  Diagnosis Date  . Mixed hyperlipidemia   . Hypothyroidism   . GERD (gastroesophageal reflux disease)   . Atrial fibrillation     Remote amiodarone use, recurred in 07/2011 post op from knee arthroplasty  . Obese   . History of blood transfusion 08/31/2011    A+ type  . History of GI bleed     Gastritis, was on Xarelto and ASA  . Carpal tunnel syndrome   . Arthritis     Knees  . DJD (degenerative joint disease), thoracolumbar   . Rheumatoid arthritis   . H/O bronchitis   . Hemorrhoids   . Measles     as child  . Mumps     as child  . Carotid artery disease   . PONV (postoperative nausea and vomiting)   . Anemia   . Difficulty sleeping   . Multiple bruises     DUE TO FALL 01/24/13    Allergies:  Allergies  Allergen Reactions  . Omeprazole Nausea And Vomiting  . Penicillins Itching  . Morphine Rash    And hot flashes  . Xarelto [Rivaroxaban] Other (See Comments)    Bleeding,spitting up blood  MEDICATIONS: . [MAR HOLD] enoxaparin (LOVENOX) injection  40 mg Subcutaneous Q24H  . [MAR HOLD] famotidine (PEPCID) IV  20 mg Intravenous Q12H  . [facility] HOLD] metronidazole  500 mg Intravenous Q8H  . [MAR HOLD] sodium chloride  500 mL Intravenous Once  . [facility] HOLD] vancomycin  500 mg Oral Q6H  . [facility] HOLD] vancomycin  750 mg Intravenous Q12H    History  Substance Use Topics  . Smoking status: Never Smoker   . Smokeless tobacco: Never Used  . Alcohol Use: No    Family History  Problem Relation Age of Onset  . Cancer    . Stroke    . Diabetes    . Hypertension    . Colon cancer Neg Hx   . Colon polyps Neg Hx   . Rectal cancer Neg Hx   . Stomach cancer Neg Hx     Review of  Systems - Unable to obtain since altered mental status due to sepsis  OBJECTIVE: Temp:  [97.4 F (36.3 C)-97.7 F (36.5 C)] 97.4 F (36.3 C) (01/20 0730) Pulse Rate:  [52-131] 115 (01/20 1100) Resp:  [21-37] 37 (01/20 1100) BP: (88-159)/(65-121) 150/75 mmHg (01/20 1100) SpO2:  [93 %-97 %] 93 % (01/20 1100) Weight:  [156 lb 1.4 oz (70.8 kg)] 156 lb 1.4 oz (70.8 kg) (01/20 0500) General appearance - alert, well appearing, and in no distress  Mental status - alert, oriented to person, place, and time  Eyes - pupils equal and reactive, extraocular eye movements intact  Mouth - mucous membranes moist, pharynx normal without lesions  Chest - clear to auscultation, no wheezes, rales or rhonchi, symmetric air entry, poor effort  Heart - normal rate, regular rhythm, normal S1, S2, no murmurs,  Abdomen - no abdominal bruits, BS present, TTP diffusely throughout  Musculoskeletal - swollen R knee, ROM limited  Extremities - peripheral pulses normal, no pedal edema, no clubbing or cyanosis  Skin - R knee with erythema, mild tenderness, edema     LABS: Results for orders placed during the hospital encounter of 03/30/2013 (from the past 48 hour(s))  TROPONIN I     Status: None   Collection Time    04/10/2013  1:16 PM      Result Value Range   Troponin I <0.30  <0.30 ng/mL   Comment:            Due to the release kinetics of cTnI,     a negative result within the first hours     of the onset of symptoms does not rule out     myocardial infarction with certainty.     If myocardial infarction is still suspected,     repeat the test at appropriate intervals.  MRSA PCR SCREENING     Status: None   Collection Time    03/30/2013  1:22 PM      Result Value Range   MRSA by PCR NEGATIVE  NEGATIVE   Comment:            The GeneXpert MRSA Assay (FDA     approved for NASAL specimens     only), is one component of a     comprehensive MRSA colonization     surveillance program. It is not     intended  to diagnose MRSA     infection nor to guide or     monitor treatment for     MRSA infections.  URINALYSIS, ROUTINE W REFLEX MICROSCOPIC  Status: Abnormal   Collection Time    03/28/2013  1:54 PM      Result Value Range   Color, Urine RED (*) YELLOW   Comment: BIOCHEMICALS MAY BE AFFECTED BY COLOR   APPearance CLOUDY (*) CLEAR   Specific Gravity, Urine 1.038 (*) 1.005 - 1.030   pH 5.5  5.0 - 8.0   Glucose, UA NEGATIVE  NEGATIVE mg/dL   Hgb urine dipstick NEGATIVE  NEGATIVE   Bilirubin Urine LARGE (*) NEGATIVE   Ketones, ur 15 (*) NEGATIVE mg/dL   Protein, ur 100 (*) NEGATIVE mg/dL   Urobilinogen, UA 1.0  0.0 - 1.0 mg/dL   Nitrite POSITIVE (*) NEGATIVE   Leukocytes, UA MODERATE (*) NEGATIVE  URINE MICROSCOPIC-ADD ON     Status: Abnormal   Collection Time    04/05/2013  1:54 PM      Result Value Range   Squamous Epithelial / LPF RARE  RARE   WBC, UA 7-10  <3 WBC/hpf   Bacteria, UA MANY (*) RARE   Crystals CA OXALATE CRYSTALS (*) NEGATIVE   Urine-Other MANY YEAST    URINE CULTURE     Status: None   Collection Time    04/05/2013  1:54 PM      Result Value Range   Specimen Description URINE, CATHETERIZED     Special Requests NONE     Culture  Setup Time       Value: 04/14/2013 15:45     Performed at [facility]       Value: NO GROWTH     Performed at [facility]   Culture       Value: NO GROWTH     Performed at [facility]   Report Status 04/08/2013 FINAL    COMPREHENSIVE METABOLIC PANEL     Status: Abnormal   Collection Time    04/21/2013  2:34 PM      Result Value Range   Sodium 126 (*) 137 - 147 mEq/L   Potassium 4.7  3.7 - 5.3 mEq/L   Chloride 92 (*) 96 - 112 mEq/L   CO2 19  19 - 32 mEq/L   Glucose, Bld 127 (*) 70 - 99 mg/dL   BUN 21  6 - 23 mg/dL   Creatinine, Ser 0.89  0.50 - 1.10 mg/dL   Calcium 7.8 (*) 8.4 - 10.5 mg/dL   Total Protein 5.9 (*) 6.0 - 8.3 g/dL   Albumin 1.9 (*) 3.5 - 5.2 g/dL   AST 37  0 - 37 U/L    Comment: SLIGHT HEMOLYSIS   ALT 16  0 - 35 U/L   Alkaline Phosphatase 150 (*) 39 - 117 U/L   Total Bilirubin 0.8  0.3 - 1.2 mg/dL   GFR calc non Af Amer 60 (*) >90 mL/min   GFR calc Af Amer 69 (*) >90 mL/min   Comment: (NOTE)     The eGFR has been calculated using the CKD EPI equation.     This calculation has not been validated in all clinical situations.     eGFR's persistently <90 mL/min signify possible Chronic Kidney     Disease.  MAGNESIUM     Status: None   Collection Time    03/28/2013  2:34 PM      Result Value Range   Magnesium 1.9  1.5 - 2.5 mg/dL  PHOSPHORUS     Status: Abnormal   Collection Time    04/27/2013  2:34 PM  Result Value Range   Phosphorus 1.9 (*) 2.3 - 4.6 mg/dL  LIPASE, BLOOD     Status: Abnormal   Collection Time    04/27/2013  2:34 PM      Result Value Range   Lipase 10 (*) 11 - 59 U/L  LACTIC ACID, PLASMA     Status: Abnormal   Collection Time    04/25/2013  2:34 PM      Result Value Range   Lactic Acid, Venous 2.6 (*) 0.5 - 2.2 mmol/L  PROCALCITONIN     Status: None   Collection Time    04/14/2013  2:34 PM      Result Value Range   Procalcitonin 2.19     Comment:            Interpretation:     PCT > 2 ng/mL:     Systemic infection (sepsis) is likely,     unless other causes are known.     (NOTE)             ICU PCT Algorithm               Non ICU PCT Algorithm        ----------------------------     ------------------------------             PCT < 0.25 ng/mL                 PCT < 0.1 ng/mL         Stopping of antibiotics            Stopping of antibiotics           strongly encouraged.               strongly encouraged.        ----------------------------     ------------------------------           PCT level decrease by               PCT < 0.25 ng/mL           >= 80% from peak PCT           OR PCT 0.25 - 0.5 ng/mL          Stopping of antibiotics                                                 encouraged.         Stopping of antibiotics                encouraged.        ----------------------------     ------------------------------           PCT level decrease by              PCT >= 0.25 ng/mL           < 80% from peak PCT            AND PCT >= 0.5 ng/mL            Continuing antibiotics  encouraged.           Continuing antibiotics                encouraged.        ----------------------------     ------------------------------         PCT level increase compared          PCT > 0.5 ng/mL             with peak PCT AND              PCT >= 0.5 ng/mL             Escalation of antibiotics                                              strongly encouraged.          Escalation of antibiotics            strongly encouraged.  CBC WITH DIFFERENTIAL     Status: Abnormal   Collection Time    04/19/2013  2:34 PM      Result Value Range   WBC 182.0 (*) 4.0 - 10.5 K/uL   Comment: WHITE COUNT CONFIRMED ON SMEAR     CRITICAL RESULT CALLED TO, READ BACK BY AND VERIFIED WITH:     R CULLOM,RN 1533 04/20/2013 D BRADLEY   RBC 4.17  3.87 - 5.11 MIL/uL   Hemoglobin 12.5  12.0 - 15.0 g/dL   HCT 36.0  36.0 - 46.0 %   MCV 86.3  78.0 - 100.0 fL   MCH 30.0  26.0 - 34.0 pg   MCHC 34.7  30.0 - 36.0 g/dL   RDW 16.1 (*) 11.5 - 15.5 %   Platelets 423 (*) 150 - 400 K/uL   Comment: PLATELET COUNT CONFIRMED BY SMEAR   Neutrophils Relative % 95 (*) 43 - 77 %   Lymphocytes Relative 3 (*) 12 - 46 %   Monocytes Relative 2 (*) 3 - 12 %   Eosinophils Relative 0  0 - 5 %   Basophils Relative 0  0 - 1 %   Band Neutrophils 0  0 - 10 %   Metamyelocytes Relative 0     Myelocytes 0     Promyelocytes Absolute 0     Blasts 0     nRBC 0  0 /100 WBC   Neutro Abs 172.9 (*) 1.7 - 7.7 K/uL   Lymphs Abs 5.5 (*) 0.7 - 4.0 K/uL   Monocytes Absolute 3.6 (*) 0.1 - 1.0 K/uL   Eosinophils Absolute 0.0  0.0 - 0.7 K/uL   Basophils Absolute 0.0  0.0 - 0.1 K/uL   WBC Morphology       Value: MODERATE LEFT SHIFT (>5% METAS AND  MYELOS,OCC PRO NOTED)  PROTIME-INR     Status: Abnormal   Collection Time    04/13/2013  2:34 PM      Result Value Range   Prothrombin Time 17.6 (*) 11.6 - 15.2 seconds   INR 1.49  0.00 - 1.49  APTT     Status: None   Collection Time    04/24/2013  2:34 PM      Result Value Range   aPTT 31  24 - 37 seconds  TYPE AND SCREEN     Status: None   Collection Time  04/21/2013  2:34 PM      Result Value Range   ABO/RH(D) A POS     Antibody Screen NEG     Sample Expiration 04/05/2013    PATHOLOGIST SMEAR REVIEW     Status: None   Collection Time    04/03/2013  2:34 PM      Result Value Range   Path Review Marked Leukocytosis with     Comment: left shifted neutrophilia.     Suggestive of myeloproliferative disorder.     Hematological evaluation is needed.     Reviewed By Violet Baldy, M.D.     04/08/2013  CBC     Status: Abnormal   Collection Time    04/24/2013  2:48 AM      Result Value Range   WBC 186.3 (*) 4.0 - 10.5 K/uL   Comment: REPEATED TO VERIFY     CRITICAL VALUE NOTED.  VALUE IS CONSISTENT WITH PREVIOUSLY REPORTED AND CALLED VALUE.   RBC 4.04  3.87 - 5.11 MIL/uL   Hemoglobin 12.2  12.0 - 15.0 g/dL   HCT 35.3 (*) 36.0 - 46.0 %   MCV 87.4  78.0 - 100.0 fL   MCH 30.2  26.0 - 34.0 pg   MCHC 34.6  30.0 - 36.0 g/dL   RDW 16.4 (*) 11.5 - 15.5 %   Platelets 428 (*) 150 - 400 K/uL  BASIC METABOLIC PANEL     Status: Abnormal   Collection Time    04/07/2013  2:48 AM      Result Value Range   Sodium 122 (*) 137 - 147 mEq/L   Potassium 4.7  3.7 - 5.3 mEq/L   Chloride 91 (*) 96 - 112 mEq/L   CO2 17 (*) 19 - 32 mEq/L   Glucose, Bld 124 (*) 70 - 99 mg/dL   BUN 27 (*) 6 - 23 mg/dL   Creatinine, Ser 1.02  0.50 - 1.10 mg/dL   Calcium 7.7 (*) 8.4 - 10.5 mg/dL   GFR calc non Af Amer 51 (*) >90 mL/min   GFR calc Af Amer 59 (*) >90 mL/min   Comment: (NOTE)     The eGFR has been calculated using the CKD EPI equation.     This calculation has not been validated in all clinical situations.      eGFR's persistently <90 mL/min signify possible Chronic Kidney     Disease.    MICRO: 1/19 urine cx pending  IMAGING: Dg Chest Port 1 View  04/06/2013   CLINICAL DATA:  Effusions  EXAM: PORTABLE CHEST - 1 VIEW  COMPARISON:  Portable chest x-ray at 14 April 2013  FINDINGS: The lung volumes are low. The left lateral costophrenic angle is sharp. There is mild blunting of the right lateral costophrenic angle and there is haziness over the lower 1/2 of the right hemi thorax that may reflect a pleural effusion layering posteriorly. There is no alveolar infiltrate. The interstitial markings are mildly prominent on the right today. The cardiopericardial silhouette is normal in size. The pulmonary vascularity is not clearly engorged. There is a PICC line in place via the right upper extremity with the tip in the region of the junction of the SVC with the right atrium. The gas pattern within the upper abdomen is nonspecific.  IMPRESSION: 1. A small right pleural effusion is suspected. The pulmonary interstitial markings on the right are more conspicuous today than in the past. The lung volumes remain low. 2. The cardiopericardial silhouette is not  enlarged. The central pulmonary vascularity is more conspicuous today which may indicate an element of underlying CHF.   Electronically Signed   By: David  Martinique   On: 04/20/2013 07:47   Dg Abd Portable 1v  04/24/2013   CLINICAL DATA:  Colitis.  EXAM: PORTABLE ABDOMEN - 1 VIEW  COMPARISON:  CT 04/13/2013  FINDINGS: The transverse colon is mildly distended. Mucosal edema is present in the left colon, best seen on the recent CT. There is some retained stool in the right colon. Small bowel nondilated  Lumbar levoscoliosis without acute bony abnormality.  IMPRESSION: Colonic ileus with edema in the left colon. Negative for bowel obstruction.   Electronically Signed   By: Franchot Gallo M.D.   On: 03/31/2013 14:00   Assessment/Plan:  80yo F with severe cdifficile  colitis/sepsis and impressive leukamoid reaction with wbc 186K.   - agree with plan for colon resection - continue on IV metronidazole and oral vancomycin at high dose of 537m QID - can consider giving oral vanco enemas if not going to surgery  - chronic prosthetic joint infection - currently on Iv vanco for nearly 7 wks. Can stop since she has received sufficient antibiotics for group B strep joint infection. Recommend to stop IV vanco tomorrow. Per aluisio evaluation, her knee looks best that is has in over  6 months.

## 2013-04-16 NOTE — Consult Note (Signed)
Reason for Consult: toxic megacolon  Referring Physician: Dr. Rockwell Alexandria  [name] [name] is an 80 y.o. female.  HPI: she was transferred from [facility] [place] yesterday with sepsis due to severe C. Diff colitis, a fib with RVR, infected right TKA.  Her WBC at [facility] was approximately 60K, repeat yesterday 182K and this AM 186K.  She has remained afebrile.  She exhibits tachycardia and tachypnea.  She is alert and oriented to person only.  She is poor historian and therefore a history was unable to be obtained.  She complains of abdominal pain and nausea.  She is a DNR.  Son POA was contacted by nursing and will be arriving to the unit shortly.     Past Medical History  Diagnosis Date  . Mixed hyperlipidemia   . Hypothyroidism   . GERD (gastroesophageal reflux disease)   . Atrial fibrillation     Remote amiodarone use, recurred in 07/2011 post op from knee arthroplasty  . Obese   . History of blood transfusion 08/31/2011    A+ type  . History of GI bleed     Gastritis, was on Xarelto and ASA  . Carpal tunnel syndrome   . Arthritis     Knees  . DJD (degenerative joint disease), thoracolumbar   . Rheumatoid arthritis   . H/O bronchitis   . Hemorrhoids   . Measles     as child  . Mumps     as child  . Carotid artery disease   . PONV (postoperative nausea and vomiting)   . Anemia   . Difficulty sleeping   . Multiple bruises     DUE TO FALL 01/24/13    Past Surgical History  Procedure Laterality Date  . Tonsillectomy    . Cholecystectomy  04/29/2005    Dr. Johnathan Hausen  . Thoracotomy  4/07    Removal bronchogenic cyst  . Total knee arthroplasty  08/10/2011    Procedure: TOTAL KNEE ARTHROPLASTY;  Surgeon: Gearlean Alf, MD;  Location: [facility];  Service: Orthopedics;  Laterality: Left;  . Rotator cuff repair  1992, 1993    Bilateral  . Tubal ligation      Bilateral  . Ventral hernia repair  05/25/2005    Incarcerated, lysis of adhesions, Dr. Johnathan Hausen  . Appendectomy      as child   . Bronchogenic cyst  2007  . Total knee arthroplasty Right 10/08/2012    Procedure: RIGHT TOTAL KNEE ARTHROPLASTY;  Surgeon: Gearlean Alf, MD;  Location: [facility];  Service: Orthopedics;  Laterality: Right;  . Irrigation and debridement knee Right 10/11/2012    Procedure: IRRIGATION AND DEBRIDEMENT KNEE;  Surgeon: Gearlean Alf, MD;  Location: [facility];  Service: Orthopedics;  Laterality: Right;  . Irrigation and debridement knee Right 12/12/2012    Procedure: RIGHT KNEE IRRIGATION AND DEBRIDEMENT, APPLICATION OF WOUND VAC;  Surgeon: Gearlean Alf, MD;  Location: [facility];  Service: Orthopedics;  Laterality: Right;  . Joint replacement    . Irrigation and debridement knee Right 01/28/2013    Procedure: INCISION AND DRAINAGE RIGHT KNEE  RESECTION ARTHROPLASTY ANTIBIOTIC CEMENT SPACER   ;  Surgeon: Gearlean Alf, MD;  Location: [facility];  Service: Orthopedics;  Laterality: Right;  . Irrigation and debridement knee Right 02/06/2013    Procedure: IRRIGATION AND DEBRIDEMENT RIGHT KNEE;  Surgeon: Gearlean Alf, MD;  Location: [facility];  Service: Orthopedics;  Laterality: Right;  . Excisional total knee arthroplasty with antibiotic spacers Right  02/15/2013    Procedure: IRRIGATION AND DEBRIDIMENT RIGHT KNEE  WITH EXCHANGE OF  ANTIBIOTIC SPACERS;  Surgeon: Gearlean Alf, MD;  Location: [facility];  Service: Orthopedics;  Laterality: Right;  . Excisional total knee arthroplasty with antibiotic spacers Right 02/25/2013    Procedure: IRRIGATION AND DEBRIDIMENT  RIGHT KNEE AND REMOVAL AND REPLACEMENT OF ANTIBIOTIC SPACERS RIGHT KNEE;  Surgeon: Gearlean Alf, MD;  Location: [facility];  Service: Orthopedics;  Laterality: Right;    Family History  Problem Relation Age of Onset  . Cancer    . Stroke    . Diabetes    . Hypertension    . Colon cancer Neg Hx   . Colon polyps Neg Hx   . Rectal cancer Neg Hx   . Stomach cancer Neg Hx     Social History:  reports that she has never smoked. She has never used  smokeless tobacco. She reports that she does not drink alcohol or use illicit drugs.  Allergies:  Allergies  Allergen Reactions  . Omeprazole Nausea And Vomiting  . Penicillins Itching  . Morphine Rash    And hot flashes  . Xarelto [Rivaroxaban] Other (See Comments)    Bleeding,spitting up blood    Medications: Current facility-administered medications:0.9 %  sodium chloride infusion, 250 mL, Intravenous, PRN, Rigoberto Noel, MD;  0.9 %  sodium chloride infusion, , Intravenous, Continuous, Rigoberto Noel, MD, Last Rate: 75 mL/hr at 04/07/2013 0800;  amiodarone (NEXTERONE PREMIX) 360 mg/200 mL dextrose IV infusion, 30 mg/hr, Intravenous, Continuous, Rigoberto Noel, MD, Last Rate: 16.7 mL/hr at 03/30/2013 0541, 30 mg/hr at 04/24/2013 0541 diltiazem (CARDIZEM) 100 mg in dextrose 5 % 100 mL infusion, 5-15 mg/hr, Intravenous, Continuous, Rigoberto Noel, MD, Last Rate: 15 mL/hr at 04/24/2013 0105, 15 mg/hr at 04/27/2013 0105;  enoxaparin (LOVENOX) injection 40 mg, 40 mg, Subcutaneous, Q24H, Rigoberto Noel, MD, 40 mg at 04/24/2013 1528;  famotidine (PEPCID) IVPB 20 mg, 20 mg, Intravenous, Q12H, Rigoberto Noel, MD metoprolol (LOPRESSOR) injection 2.5-5 mg, 2.5-5 mg, Intravenous, Q3H PRN, Rigoberto Noel, MD, 5 mg at 04/14/2013 0850;  metroNIDAZOLE (FLAGYL) IVPB 500 mg, 500 mg, Intravenous, Q8H, Dareen Piano, RPH, 500 mg at 04/09/2013 8786;  ondansetron [facility]) injection 4 mg, 4 mg, Intravenous, Q6H PRN, Rigoberto Noel, MD;  sodium chloride 0.9 % bolus 500 mL, 500 mL, Intravenous, Once, Rigoberto Noel, MD vancomycin (VANCOCIN) 50 mg/mL oral solution 500 mg, 500 mg, Oral, Q6H, Carlyle Basques, MD, 500 mg at 04/21/2013 0548;  vancomycin (VANCOCIN) IVPB 750 mg/150 ml premix, 750 mg, Intravenous, Q12H, Dareen Piano, RPH, 750 mg at 04/26/2013 2105  Results for orders placed during the hospital encounter of 04/03/2013 (from the past 48 hour(s))  TROPONIN I     Status: None   Collection Time    04/09/2013  1:16 PM      Result Value  Range   Troponin I <0.30  <0.30 ng/mL   Comment:            Due to the release kinetics of cTnI,     a negative result within the first hours     of the onset of symptoms does not rule out     myocardial infarction with certainty.     If myocardial infarction is still suspected,     repeat the test at appropriate intervals.  MRSA PCR SCREENING     Status: None   Collection Time    04/27/2013  1:22  PM      Result Value Range   MRSA by PCR NEGATIVE  NEGATIVE   Comment:            The GeneXpert MRSA Assay (FDA     approved for NASAL specimens     only), is one component of a     comprehensive MRSA colonization     surveillance program. It is not     intended to diagnose MRSA     infection nor to guide or     monitor treatment for     MRSA infections.  URINALYSIS, ROUTINE W REFLEX MICROSCOPIC     Status: Abnormal   Collection Time    04/20/2013  1:54 PM      Result Value Range   Color, Urine RED (*) YELLOW   Comment: BIOCHEMICALS MAY BE AFFECTED BY COLOR   APPearance CLOUDY (*) CLEAR   Specific Gravity, Urine 1.038 (*) 1.005 - 1.030   pH 5.5  5.0 - 8.0   Glucose, UA NEGATIVE  NEGATIVE mg/dL   Hgb urine dipstick NEGATIVE  NEGATIVE   Bilirubin Urine LARGE (*) NEGATIVE   Ketones, ur 15 (*) NEGATIVE mg/dL   Protein, ur 100 (*) NEGATIVE mg/dL   Urobilinogen, UA 1.0  0.0 - 1.0 mg/dL   Nitrite POSITIVE (*) NEGATIVE   Leukocytes, UA MODERATE (*) NEGATIVE  URINE MICROSCOPIC-ADD ON     Status: Abnormal   Collection Time      1:54 PM      Result Value Range   Squamous Epithelial / LPF RARE  RARE   WBC, UA 7-10  <3 WBC/hpf   Bacteria, UA MANY (*) RARE   Crystals CA OXALATE CRYSTALS (*) NEGATIVE   Urine-Other MANY YEAST    COMPREHENSIVE METABOLIC PANEL     Status: Abnormal   Collection Time    04/14/2013  2:34 PM      Result Value Range   Sodium 126 (*) 137 - 147 mEq/L   Potassium 4.7  3.7 - 5.3 mEq/L   Chloride 92 (*) 96 - 112 mEq/L   CO2 19  19 - 32 mEq/L   Glucose, Bld  127 (*) 70 - 99 mg/dL   BUN 21  6 - 23 mg/dL   Creatinine, Ser 0.89  0.50 - 1.10 mg/dL   Calcium 7.8 (*) 8.4 - 10.5 mg/dL   Total Protein 5.9 (*) 6.0 - 8.3 g/dL   Albumin 1.9 (*) 3.5 - 5.2 g/dL   AST 37  0 - 37 U/L   Comment: SLIGHT HEMOLYSIS   ALT 16  0 - 35 U/L   Alkaline Phosphatase 150 (*) 39 - 117 U/L   Total Bilirubin 0.8  0.3 - 1.2 mg/dL   GFR calc non Af Amer 60 (*) >90 mL/min   GFR calc Af Amer 69 (*) >90 mL/min   Comment: (NOTE)     The eGFR has been calculated using the CKD EPI equation.     This calculation has not been validated in all clinical situations.     eGFR's persistently <90 mL/min signify possible Chronic Kidney     Disease.  MAGNESIUM     Status: None   Collection Time    04/27/2013  2:34 PM      Result Value Range   Magnesium 1.9  1.5 - 2.5 mg/dL  PHOSPHORUS     Status: Abnormal   Collection Time    04/02/2013  2:34 PM      Result Value Range  Phosphorus 1.9 (*) 2.3 - 4.6 mg/dL  LIPASE, BLOOD     Status: Abnormal   Collection Time    04/23/2013  2:34 PM      Result Value Range   Lipase 10 (*) 11 - 59 U/L  LACTIC ACID, PLASMA     Status: Abnormal   Collection Time    04/05/2013  2:34 PM      Result Value Range   Lactic Acid, Venous 2.6 (*) 0.5 - 2.2 mmol/L  PROCALCITONIN     Status: None   Collection Time    04/26/2013  2:34 PM      Result Value Range   Procalcitonin 2.19     Comment:            Interpretation:     PCT > 2 ng/mL:     Systemic infection (sepsis) is likely,     unless other causes are known.     (NOTE)             ICU PCT Algorithm               Non ICU PCT Algorithm        ----------------------------     ------------------------------             PCT < 0.25 ng/mL                 PCT < 0.1 ng/mL         Stopping of antibiotics            Stopping of antibiotics           strongly encouraged.               strongly encouraged.        ----------------------------     ------------------------------           PCT level decrease by                PCT < 0.25 ng/mL           >= 80% from peak PCT           OR PCT 0.25 - 0.5 ng/mL          Stopping of antibiotics                                                 encouraged.         Stopping of antibiotics               encouraged.        ----------------------------     ------------------------------           PCT level decrease by              PCT >= 0.25 ng/mL           < 80% from peak PCT            AND PCT >= 0.5 ng/mL            Continuing antibiotics                                                  encouraged.  Continuing antibiotics                encouraged.        ----------------------------     ------------------------------         PCT level increase compared          PCT > 0.5 ng/mL             with peak PCT AND              PCT >= 0.5 ng/mL             Escalation of antibiotics                                              strongly encouraged.          Escalation of antibiotics            strongly encouraged.  CBC WITH DIFFERENTIAL     Status: Abnormal   Collection Time      2:34 PM      Result Value Range   WBC 182.0 (*) 4.0 - 10.5 K/uL   Comment: WHITE COUNT CONFIRMED ON SMEAR     CRITICAL RESULT CALLED TO, READ BACK BY AND VERIFIED WITH:     R CULLOM,RN 1533 04/22/2013 D BRADLEY   RBC 4.17  3.87 - 5.11 MIL/uL   Hemoglobin 12.5  12.0 - 15.0 g/dL   HCT 76.1  91.5 - 50.2 %   MCV 86.3  78.0 - 100.0 fL   MCH 30.0  26.0 - 34.0 pg   MCHC 34.7  30.0 - 36.0 g/dL   RDW 71.4 (*) 23.2 - 00.9 %   Platelets 423 (*) 150 - 400 K/uL   Comment: PLATELET COUNT CONFIRMED BY SMEAR   Neutrophils Relative % 95 (*) 43 - 77 %   Lymphocytes Relative 3 (*) 12 - 46 %   Monocytes Relative 2 (*) 3 - 12 %   Eosinophils Relative 0  0 - 5 %   Basophils Relative 0  0 - 1 %   Band Neutrophils 0  0 - 10 %   Metamyelocytes Relative 0     Myelocytes 0     Promyelocytes Absolute 0     Blasts 0     nRBC 0  0 /100 WBC   Neutro Abs 172.9 (*) 1.7 - 7.7 K/uL   Lymphs Abs  5.5 (*) 0.7 - 4.0 K/uL   Monocytes Absolute 3.6 (*) 0.1 - 1.0 K/uL   Eosinophils Absolute 0.0  0.0 - 0.7 K/uL   Basophils Absolute 0.0  0.0 - 0.1 K/uL   WBC Morphology       Value: MODERATE LEFT SHIFT (>5% METAS AND MYELOS,OCC PRO NOTED)  PROTIME-INR     Status: Abnormal   Collection Time    04/12/2013  2:34 PM      Result Value Range   Prothrombin Time 17.6 (*) 11.6 - 15.2 seconds   INR 1.49  0.00 - 1.49  APTT     Status: None   Collection Time    04/06/2013  2:34 PM      Result Value Range   aPTT 31  24 - 37 seconds  TYPE AND SCREEN     Status: None   Collection Time    04/14/2013  2:34 PM      Result  Value Range   ABO/RH(D) A POS     Antibody Screen NEG     Sample Expiration 04/09/2013    CBC     Status: Abnormal   Collection Time    04/01/2013  2:48 AM      Result Value Range   WBC 186.3 (*) 4.0 - 10.5 K/uL   Comment: REPEATED TO VERIFY     CRITICAL VALUE NOTED.  VALUE IS CONSISTENT WITH PREVIOUSLY REPORTED AND CALLED VALUE.   RBC 4.04  3.87 - 5.11 MIL/uL   Hemoglobin 12.2  12.0 - 15.0 g/dL   HCT 44.9 (*) 20.1 - 00.7 %   MCV 87.4  78.0 - 100.0 fL   MCH 30.2  26.0 - 34.0 pg   MCHC 34.6  30.0 - 36.0 g/dL   RDW 12.1 (*) 97.5 - 88.3 %   Platelets 428 (*) 150 - 400 K/uL  BASIC METABOLIC PANEL     Status: Abnormal   Collection Time    03/30/2013  2:48 AM      Result Value Range   Sodium 122 (*) 137 - 147 mEq/L   Potassium 4.7  3.7 - 5.3 mEq/L   Chloride 91 (*) 96 - 112 mEq/L   CO2 17 (*) 19 - 32 mEq/L   Glucose, Bld 124 (*) 70 - 99 mg/dL   BUN 27 (*) 6 - 23 mg/dL   Creatinine, Ser 2.54  0.50 - 1.10 mg/dL   Calcium 7.7 (*) 8.4 - 10.5 mg/dL   GFR calc non Af Amer 51 (*) >90 mL/min   GFR calc Af Amer 59 (*) >90 mL/min   Comment: (NOTE)     The eGFR has been calculated using the CKD EPI equation.     This calculation has not been validated in all clinical situations.     eGFR's persistently <90 mL/min signify possible Chronic Kidney     Disease.    Dg Chest Port 1  View  04/09/2013   CLINICAL DATA:  Effusions  EXAM: PORTABLE CHEST - 1 VIEW  COMPARISON:  Portable chest x-ray at 14 April 2013  FINDINGS: The lung volumes are low. The left lateral costophrenic angle is sharp. There is mild blunting of the right lateral costophrenic angle and there is haziness over the lower 1/2 of the right hemi thorax that may reflect a pleural effusion layering posteriorly. There is no alveolar infiltrate. The interstitial markings are mildly prominent on the right today. The cardiopericardial silhouette is normal in size. The pulmonary vascularity is not clearly engorged. There is a PICC line in place via the right upper extremity with the tip in the region of the junction of the SVC with the right atrium. The gas pattern within the upper abdomen is nonspecific.  IMPRESSION: 1. A small right pleural effusion is suspected. The pulmonary interstitial markings on the right are more conspicuous today than in the past. The lung volumes remain low. 2. The cardiopericardial silhouette is not enlarged. The central pulmonary vascularity is more conspicuous today which may indicate an element of underlying CHF.   Electronically Signed   By: David  [place]   On: 04/19/2013 07:47   Dg Abd Portable 1v  04/07/2013   CLINICAL DATA:  Colitis.  EXAM: PORTABLE ABDOMEN - 1 VIEW  COMPARISON:  CT 04/13/2013  FINDINGS: The transverse colon is mildly distended. Mucosal edema is present in the left colon, best seen on the recent CT. There is some retained stool in the right colon. Small bowel nondilated  Lumbar levoscoliosis without acute bony abnormality.  IMPRESSION: Colonic ileus with edema in the left colon. Negative for bowel obstruction.   Electronically Signed   By: Franchot Gallo M.D.   On: 04/09/2013 14:00    Review of Systems  Unable to perform ROS  Blood pressure 159/121, pulse 74, temperature 97.4 F (36.3 C), temperature source Oral, resp. rate 31, height 5' 5.5" (1.664 m), weight 156 lb 1.4  oz (70.8 kg), SpO2 95.00%. Physical Exam  Constitutional: She appears distressed.  Neck: Neck supple.  Cardiovascular: Exam reveals no gallop and no friction rub.   No murmur heard. s1s2 irregularly irregular, tachycardic   Respiratory: Effort normal and breath sounds normal.  tachypnic  GI: Soft. Bowel sounds are normal. She exhibits distension. She exhibits no mass. There is tenderness. There is no guarding.  Prior laparoscopic cholecystectomy scars   Musculoskeletal: She exhibits edema.  Neurological: She is alert.  Oriented to person only  Skin: Skin is warm and dry. She is not diaphoretic.  Psychiatric:  confused    Assessment/Plan: Sepsis, c. Diff colitis Suspected megacolon  Leukocytosis  Anemia CHF PCM  Given increasing white count, radiology results we recommend a subtotal colectomy with colostomy versus comfort care and consult to palliative care.  She would be a moderate-high surgical risk candidate given comorbidities, age and present illness.  The patients son is on his way, we will discuss treatment options.  Should the family wish to proceed with surgical management, we plan to take her to the OR later today.  I will make her NPO for the time being.    Erby Pian ANP-BC Pager [phone] 04/24/2013, 10:57 AM

## 2013-04-16 NOTE — Op Note (Signed)
Exp. Lap.; Partial Colectomy and Ostomy  Procedure Note  [name] [name] 03/31/2013 - 04/02/2013   Pre-op Diagnosis: C-Diff Colitis     Post-op Diagnosis: same  Procedure(s): Exp. Lap.; Partial Colectomy and Ostomy  Surgeon(s): Harl Bowie, MD  Anesthesia: General  Staff:  Circulator: Nobie Putnam, RN; Megan Day Cavanaugh, RN Relief Scrub: Amy Geraldo Pitter, RN Scrub Person: Hassell Halim, RN; Montel Culver, RN; Kateri Plummer, CST Circulator Assistant: Donne Hazel, RN RN First Assistant: Hassell Halim, RN  Estimated Blood Loss: Minimal               Specimens: sent to path          [facility]   Date: 04/02/2013  Time: 4:34 PM

## 2013-04-16 NOTE — Anesthesia Preprocedure Evaluation (Addendum)
Anesthesia Evaluation  Patient identified by MRN, date of birth, ID band Patient awake    Reviewed: Allergy & Precautions, H&P , NPO status , Patient's Chart, lab work & pertinent test results, reviewed documented beta blocker date and time   History of Anesthesia Complications (+) PONV and history of anesthetic complications  Airway Mallampati: II TM Distance: >3 FB Neck ROM: Limited  Mouth opening: Limited Mouth Opening  Dental  (+) Teeth Intact   Pulmonary    + wheezing      Cardiovascular hypertension, Pt. on medications + Peripheral Vascular Disease + dysrhythmias Atrial Fibrillation Rhythm:Irregular Rate:Tachycardia     Neuro/Psych    GI/Hepatic GERD-  Controlled,  Endo/Other  Hypothyroidism   Renal/GU      Musculoskeletal  (+) Arthritis -, Rheumatoid disorders,    Abdominal   Peds  Hematology  (+) anemia ,   Anesthesia Other Findings   Reproductive/Obstetrics                          Anesthesia Physical Anesthesia Plan  ASA: IV and emergent  Anesthesia Plan: General   Post-op Pain Management:    Induction: Intravenous  Airway Management Planned: Oral ETT  Additional Equipment:   Intra-op Plan:   Post-operative Plan: Extubation in OR and Post-operative intubation/ventilation  Informed Consent: I have reviewed the patients History and Physical, chart, labs and discussed the procedure including the risks, benefits and alternatives for the proposed anesthesia with the patient or authorized representative who has indicated his/her understanding and acceptance.   Dental advisory given  Plan Discussed with: CRNA and Surgeon  Anesthesia Plan Comments:       Anesthesia Quick Evaluation

## 2013-04-16 NOTE — Progress Notes (Signed)
Name: [name] [name] MRN: [id number] DOB: 02/01/1931    ADMISSION DATE:  04/24/2013 CONSULTATION DATE: 1/19   REFERRING MD : Lovie Macadamia   PRIMARY SERVICE: PCCM   CHIEF COMPLAINT: Sepsis due to C. Diff colitis   BRIEF PATIENT DESCRIPTION:  80 yo caucasian female transferred from [facility] with sepsis due to severe C. Diff colitis with leukemoid reaction, afib-RVR, infected R TKA (s/p multiple surgeries). Patient arrived with PICC line, peripheral IV, amiodarone, and vancomycin. Admitted to PCCM for sepsis management and possible pressors for hypotension if needed.   SIGNIFICANT EVENTS / STUDIES:  1/16 CT abdomen - no megacolon  1/19 - Transfer from [place] due to C. Diff colitis  1/19 Abdominal xray - Colonic ileus with edema in the left colon. Negative for bowel obstruction.  LINES / TUBES:  PICC (right basilic) 78/9/38 >>>  Foley 1/17 >>>  PIV 1/17 >>>   CULTURES:  none   ANTIBIOTICS:  Vancomycin IV (1/19>>> Vancomycin PO (1/19>>> Flagyl (1/19>>>  SUBJECTIVE:   VITAL SIGNS: Temp:  [97.4 F (36.3 C)-97.7 F (36.5 C)] 97.4 F (36.3 C) (01/20 0730) Pulse Rate:  [52-156] 120 (01/20 0700) Resp:  [21-34] 27 (01/20 0700) BP: (88-148)/(61-105) 128/78 mmHg (01/20 0700) SpO2:  [93 %-97 %] 95 % (01/20 0700) Weight:  [69 kg (152 lb 1.9 oz)-70.8 kg (156 lb 1.4 oz)] 70.8 kg (156 lb 1.4 oz) (01/20 0500) HEMODYNAMICS:   VENTILATOR SETTINGS:   INTAKE / OUTPUT: Intake/Output     01/19 0701 - 01/20 0700 01/20 0701 - 01/21 0700   P.O. 600    I.V. (mL/kg) 1768.7 (25)    IV Piggyback 350    Total Intake(mL/kg) 2718.7 (38.4)    Urine (mL/kg/hr) 150    Total Output 150     Net +2568.7          Stool Occurrence 1 x      PHYSICAL EXAMINATION: General appearance - nauseous, alert, well appearing, and in no distress  Mental status - alert, oriented to person, place, and time  Eyes - pupils equal and reactive, extraocular eye movements intact  Mouth - mucous membranes dry,  pharynx normal without lesions  Chest - clear to auscultation, no wheezes, rales or rhonchi, symmetric air entry, poor effort  Heart - normal rate, regular rhythm, normal S1, S2, no murmurs, rubs, clicks or gallops  Abdomen - no abdominal bruits, BS present, TTP diffusely throughout  Neurological - alert, oriented, normal speech, no focal findings or movement disorder noted  Musculoskeletal - swollen R knee, ROM limited (immobilizer in place) Extremities - peripheral pulses normal, no pedal edema, no clubbing or cyanosis  Skin - R knee with no drainage, minimal swelling, no cellulitis  LABS:  CBC  Recent Labs Lab 04/09/2013 1434 04/20/2013 0248  WBC 182.0* 186.3*  HGB 12.5 12.2  HCT 36.0 35.3*  PLT 423* 428*   Coag's  Recent Labs Lab 04/05/2013 1434  APTT 31  INR 1.49   BMET  Recent Labs Lab 04/26/2013 1434 04/02/2013 0248  NA 126* 122*  K 4.7 4.7  CL 92* 91*  CO2 19 17*  BUN 21 27*  CREATININE 0.89 1.02  GLUCOSE 127* 124*   Electrolytes  Recent Labs Lab 04/13/2013 1434 04/10/2013 0248  CALCIUM 7.8* 7.7*  MG 1.9  --   PHOS 1.9*  --    Sepsis Markers  Recent Labs Lab 04/21/2013 1434  LATICACIDVEN 2.6*  PROCALCITON 2.19   ABG No results found for this basename: PHART, PCO2ART,  PO2ART,  in the last 168 hours Liver Enzymes  Recent Labs Lab 04/19/2013 1434  AST 37  ALT 16  ALKPHOS 150*  BILITOT 0.8  ALBUMIN 1.9*   Cardiac Enzymes  Recent Labs Lab 04/27/2013 1316  TROPONINI <0.30   Glucose No results found for this basename: GLUCAP,  in the last 168 hours  Imaging Dg Chest Port 1 View     CLINICAL DATA:  Effusions  EXAM: PORTABLE CHEST - 1 VIEW  COMPARISON:  Portable chest x-ray at 14 April 2013  FINDINGS: The lung volumes are low. The left lateral costophrenic angle is sharp. There is mild blunting of the right lateral costophrenic angle and there is haziness over the lower 1/2 of the right hemi thorax that may reflect a pleural effusion  layering posteriorly. There is no alveolar infiltrate. The interstitial markings are mildly prominent on the right today. The cardiopericardial silhouette is normal in size. The pulmonary vascularity is not clearly engorged. There is a PICC line in place via the right upper extremity with the tip in the region of the junction of the SVC with the right atrium. The gas pattern within the upper abdomen is nonspecific.  IMPRESSION: 1. A small right pleural effusion is suspected. The pulmonary interstitial markings on the right are more conspicuous today than in the past. The lung volumes remain low. 2. The cardiopericardial silhouette is not enlarged. The central pulmonary vascularity is more conspicuous today which may indicate an element of underlying CHF.   Electronically Signed   By: Artesia Berkey  [place]   On: 03/31/2013 07:47   Dg Abd Portable 1v  04/26/2013   CLINICAL DATA:  Colitis.  EXAM: PORTABLE ABDOMEN - 1 VIEW  COMPARISON:  CT 04/13/2013  FINDINGS: The transverse colon is mildly distended. Mucosal edema is present in the left colon, best seen on the recent CT. There is some retained stool in the right colon. Small bowel nondilated  Lumbar levoscoliosis without acute bony abnormality.  IMPRESSION: Colonic ileus with edema in the left colon. Negative for bowel obstruction.   Electronically Signed   By: Franchot Gallo M.D.   On: 04/20/2013 14:00     CXR 1/20 - A small right pleural effusion is suspected. The pulmonary interstitial markings on the right are more conspicuous today than in the past. The lung volumes remain low. The cardiopericardial silhouette is not enlarged. The central pulmonary vascularity is more conspicuous today which may indicate an element of underlying CHF.  ASSESSMENT / PLAN:   PULMONARY  A:  Hx bronchitis  Small R pleural effusion P:  O2 support to maintain >92%   CARDIOVASCULAR  A:  Uncontrolled afib with RVR CHF (EF unknown - mildly depressed per 07/2011  echo) Hypotension  P:  Continue NS @ 75, will bolus with 500cc NS Continue amiodarone, diltiazem -keep rate 120 or lower    RENAL  A:  No hx renal disease  Hypokalemia - resolved Hyponatremia Hypocalcemia - corrected Ca WNL P:  Follow electrolytes, replete as needed  Check Mg, Phos in am  GASTROINTESTINAL  A:  Severe C. Diff colitis  Suspected toxic megacolon Edematous left colon with suspected ileus - no obstruction GERD  Hx GIB  Hypoalbuminemia P:  Abx per ID  No indication for PPI ppx  Continue dysphagia diet Consider NGT for decompression if n/v or worsening abdominal pain/distension  Will obtain surgery consult  HEMATOLOGIC  A: Leukemoid reaction - WBC trending up Anemia (baseline Hgb ~9)  P:  Follow CBC  Consider FOBT if Hgb falls  Lovenox for DVT ppx  Heme consult to review PS   INFECTIOUS  A:  Multiple I&Ds to R TKA (antibiotic spacer) Hx enterococcus wound infection  C. Diff colitis  Leukocytosis, afebrile  Lactic acid mild elevation, 2.6  P:  Vanc PO, Vanc IV, Flagyl IV per Rx  ID consult  Trend fever curve  Follow CBC w/ diff   ENDOCRINE  A:  Hypothyroid  P:  Check TSH   NEUROLOGIC  A:  No active issues  P:  Will follow   TODAY'S SUMMARY: Concern for toxic megacolon and increasing WBC. Will obtain heme and surgery consult. Exchange PICC for double lumen.   Dwyane Dee, MSIV, Medical Student 04/11/2013 10:04 AM   I have personally obtained a history, examined the patient, evaluated laboratory and imaging results, formulated the assessment and plan and placed orders. CRITICAL CARE: The patient is critically ill with multiple organ systems failure and requires high complexity decision making for assessment and support, frequent evaluation and titration of therapies, application of advanced monitoring technologies and extensive interpretation of multiple databases. Critical Care Time devoted to patient care services described in this  note is ... minutes.    Pulmonary and [place] Pager: [phone]  04/05/2013, 9:09 AM

## 2013-04-16 NOTE — Consult Note (Signed)
I have seen and examined [name] [name] and have had a long discussion with her two sons.  I believe she needs surgery based on her exam, her elevated WBC and her tachypnea.  Even with surgery, I think her chance of survival is poor.  I explained this to them in detail.  I discussed the risks of surgery which include but are not limited to bleeding, ongoing infection, need for bowel resection and ostomy, injury to surrounding structures, prolonged ventilation, other cardiopulmonary issues, and death.  They understand her situation and are going to discuss it further as to whether or not to allow surgery.

## 2013-04-16 NOTE — Progress Notes (Signed)
Name: [name] [name] MRN: [id number] DOB: 10-18-1930    ADMISSION DATE:  04/02/2013 CONSULTATION DATE: 1/19   REFERRING MD : Lovie Macadamia   PRIMARY SERVICE: PCCM   CHIEF COMPLAINT: Sepsis due to C. Diff colitis   BRIEF PATIENT DESCRIPTION:  80 yo caucasian female transferred from [facility] with sepsis due to severe C. Diff colitis with leukemoid reaction, afib-RVR, infected R TKA (s/p multiple surgeries). Patient arrived with PICC line, peripheral IV, amiodarone, and vancomycin. Admitted to PCCM for sepsis management and possible pressors for hypotension if needed.   SIGNIFICANT EVENTS / STUDIES:  1/16 CT abdomen - no megacolon  1/19 - Transfer from [place] due to C. Diff colitis  1/19 Abdominal xray - Colonic ileus with edema in the left colon. Negative for bowel obstruction.  LINES / TUBES:  PICC (right basilic) 01/0/93 >>>  Foley 1/17 >>>  PIV 1/17 >>>   CULTURES:  none   ANTIBIOTICS:  Vancomycin IV (1/19>>> Vancomycin PO (1/19>>> Flagyl (1/19>>>  SUBJECTIVE:   VITAL SIGNS: Temp:  [97.4 F (36.3 C)-97.7 F (36.5 C)] 97.4 F (36.3 C) (01/20 0730) Pulse Rate:  [52-131] 115 (01/20 1100) Resp:  [21-37] 37 (01/20 1100) BP: (88-159)/(61-121) 150/75 mmHg (01/20 1100) SpO2:  [93 %-97 %] 93 % (01/20 1100) Weight:  [70.8 kg (156 lb 1.4 oz)] 70.8 kg (156 lb 1.4 oz) (01/20 0500) HEMODYNAMICS:   VENTILATOR SETTINGS:   INTAKE / OUTPUT: Intake/Output     01/19 0701 - 01/20 0700 01/20 0701 - 01/21 0700   P.O. 600 120   I.V. (mL/kg) 1800.4 (25.4) 351.8 (5)   IV Piggyback 350 500   Total Intake(mL/kg) 2750.4 (38.8) 971.8 (13.7)   Urine (mL/kg/hr) 150 50 (0.1)   Total Output 150 50   Net +2600.4 +921.8        Stool Occurrence 1 x      PHYSICAL EXAMINATION: General appearance - nauseous, alert, well appearing, and in no distress  Mental status - alert, oriented to person, place, and time  Eyes - pupils equal and reactive, extraocular eye movements intact  Mouth -  mucous membranes dry, pharynx normal without lesions  Chest - clear to auscultation, no wheezes, rales or rhonchi, symmetric air entry, poor effort  Heart - normal rate, regular rhythm, normal S1, S2, no murmurs, rubs, clicks or gallops  Abdomen - no abdominal bruits, BS present, TTP diffusely throughout  Neurological - alert, oriented, normal speech, no focal findings or movement disorder noted  Musculoskeletal - swollen R knee, ROM limited (immobilizer in place) Extremities - peripheral pulses normal, no pedal edema, no clubbing or cyanosis  Skin - R knee with no drainage, minimal swelling, no cellulitis  LABS:  CBC  Recent Labs Lab 04/04/2013 1434 04/27/2013 0248  WBC 182.0* 186.3*  HGB 12.5 12.2  HCT 36.0 35.3*  PLT 423* 428*   Coag's  Recent Labs Lab 04/23/2013 1434  APTT 31  INR 1.49   BMET  Recent Labs Lab 04/22/2013 1434 04/10/2013 0248  NA 126* 122*  K 4.7 4.7  CL 92* 91*  CO2 19 17*  BUN 21 27*  CREATININE 0.89 1.02  GLUCOSE 127* 124*   Electrolytes  Recent Labs Lab 04/07/2013 1434 04/11/2013 0248  CALCIUM 7.8* 7.7*  MG 1.9  --   PHOS 1.9*  --    Sepsis Markers  Recent Labs Lab 04/27/2013 1434  LATICACIDVEN 2.6*  PROCALCITON 2.19   ABG No results found for this basename: PHART, PCO2ART, PO2ART,  in the  last 168 hours Liver Enzymes  Recent Labs Lab 04/13/2013 1434  AST 37  ALT 16  ALKPHOS 150*  BILITOT 0.8  ALBUMIN 1.9*   Cardiac Enzymes  Recent Labs Lab 04/03/2013 1316  TROPONINI <0.30   Glucose No results found for this basename: GLUCAP,  in the last 168 hours  Imaging Dg Chest Port 1 View  04/12/2013   CLINICAL DATA:  Effusions  EXAM: PORTABLE CHEST - 1 VIEW  COMPARISON:  Portable chest x-ray at 14 April 2013  FINDINGS: The lung volumes are low. The left lateral costophrenic angle is sharp. There is mild blunting of the right lateral costophrenic angle and there is haziness over the lower 1/2 of the right hemi thorax that may reflect a  pleural effusion layering posteriorly. There is no alveolar infiltrate. The interstitial markings are mildly prominent on the right today. The cardiopericardial silhouette is normal in size. The pulmonary vascularity is not clearly engorged. There is a PICC line in place via the right upper extremity with the tip in the region of the junction of the SVC with the right atrium. The gas pattern within the upper abdomen is nonspecific.  IMPRESSION: 1. A small right pleural effusion is suspected. The pulmonary interstitial markings on the right are more conspicuous today than in the past. The lung volumes remain low. 2. The cardiopericardial silhouette is not enlarged. The central pulmonary vascularity is more conspicuous today which may indicate an element of underlying CHF.   Electronically Signed   By: David  [place]   On: 04/15/2013 07:47   Dg Abd Portable 1v     CLINICAL DATA:  Colitis.  EXAM: PORTABLE ABDOMEN - 1 VIEW  COMPARISON:  CT 04/13/2013  FINDINGS: The transverse colon is mildly distended. Mucosal edema is present in the left colon, best seen on the recent CT. There is some retained stool in the right colon. Small bowel nondilated  Lumbar levoscoliosis without acute bony abnormality.  IMPRESSION: Colonic ileus with edema in the left colon. Negative for bowel obstruction.   Electronically Signed   By: Franchot Gallo M.D.   On: 04/10/2013 14:00     CXR 1/20 - A small right pleural effusion is suspected. The pulmonary interstitial markings on the right are more conspicuous today than in the past. The lung volumes remain low. The cardiopericardial silhouette is not enlarged. The central pulmonary vascularity is more conspicuous today which may indicate an element of underlying CHF.  ASSESSMENT / PLAN:   PULMONARY  A:  Hx bronchitis  Small R pleural effusion P:  O2 support to maintain >92%   CARDIOVASCULAR  A:  Uncontrolled afib with RVR CHF (EF unknown - mildly depressed per 07/2011  echo) Hypotension  P:  Continue NS @ 75, will bolus with 500cc NS Continue amiodarone, diltiazem -keep rate 120 or lower    RENAL  A:  No hx renal disease  Hypokalemia - resolved Hyponatremia Hypocalcemia - corrected Ca WNL P:  Follow electrolytes, replete as needed  Check Mg, Phos in am  GASTROINTESTINAL  A:  Severe C. Diff colitis  Suspected toxic megacolon Edematous left colon with suspected ileus - no obstruction GERD  Hx GIB  Hypoalbuminemia P:  Abx per ID  No indication for PPI ppx  Continue dysphagia diet Consider NGT for decompression if n/v or worsening abdominal pain/distension  Will obtain surgery consult  HEMATOLOGIC  A: Leukemoid reaction - WBC trending up Anemia (baseline Hgb ~9)  P:  Follow CBC  Consider FOBT if  Hgb falls  Lovenox for DVT ppx  Heme consult to review PS   INFECTIOUS  A:  Multiple I&Ds to R TKA (antibiotic spacer) Hx enterococcus wound infection  C. Diff colitis  Leukocytosis, afebrile  Lactic acid mild elevation, 2.6  P:  Vanc PO, Vanc IV, Flagyl IV per Rx  ID consult  Trend fever curve  Follow CBC w/ diff   ENDOCRINE  A:  Hypothyroid  P:  Check TSH   NEUROLOGIC  A:  No active issues  P:  Will follow   TODAY'S SUMMARY: Concern for toxic megacolon and increasing WBC. Will obtain heme and surgery consult. Exchange PICC for double lumen.   Dwyane Dee, MSIV, Medical Student 04/22/2013 1:36 PM   I have personally obtained a history, examined the patient, evaluated laboratory and imaging results, formulated the assessment and plan and placed orders. CRITICAL CARE: The patient is critically ill with multiple organ systems failure and requires high complexity decision making for assessment and support, frequent evaluation and titration of therapies, application of advanced monitoring technologies and extensive interpretation of multiple databases. Critical Care Time devoted to patient care services described in this  note is 35 minutes.   Rigoberto Noel  Pulmonary and [place] Pager: [phone]  04/13/2013, 1:36 PM

## 2013-04-17 ENCOUNTER — Inpatient Hospital Stay (HOSPITAL_COMMUNITY): Payer: Medicare Other

## 2013-04-17 LAB — CORTISOL: Cortisol, Plasma: 47.5 ug/dL

## 2013-04-18 ENCOUNTER — Encounter (HOSPITAL_COMMUNITY): Payer: Self-pay | Admitting: Surgery

## 2013-04-18 LAB — POCT I-STAT 7, (LYTES, BLD GAS, ICA,H+H)
Acid-base deficit: 5 mmol/L — ABNORMAL HIGH (ref 0.0–2.0)
Acid-base deficit: 9 mmol/L — ABNORMAL HIGH (ref 0.0–2.0)
BICARBONATE: 18.8 meq/L — AB (ref 20.0–24.0)
Bicarbonate: 20.9 meq/L (ref 20.0–24.0)
Calcium, Ion: 1.11 mmol/L — ABNORMAL LOW (ref 1.13–1.30)
Calcium, Ion: 1.13 mmol/L (ref 1.13–1.30)
HCT: 31 % — ABNORMAL LOW (ref 36.0–46.0)
HCT: 36 % (ref 36.0–46.0)
HEMOGLOBIN: 12.2 g/dL (ref 12.0–15.0)
Hemoglobin: 10.5 g/dL — ABNORMAL LOW (ref 12.0–15.0)
O2 Saturation: 100 %
O2 Saturation: 100 %
PCO2 ART: 48.9 mmHg — AB (ref 35.0–45.0)
PH ART: 7.194 — AB (ref 7.350–7.450)
POTASSIUM: 3.9 meq/L (ref 3.7–5.3)
Potassium: 3.2 meq/L — ABNORMAL LOW (ref 3.7–5.3)
Sodium: 125 mEq/L — ABNORMAL LOW (ref 137–147)
Sodium: 127 meq/L — ABNORMAL LOW (ref 137–147)
TCO2: 22 mmol/L (ref 0–100)
TCO2: 20 mmol/L (ref 0–100)
pCO2 arterial: 41.7 mmHg (ref 35.0–45.0)
pH, Arterial: 7.308 — ABNORMAL LOW (ref 7.350–7.450)
pO2, Arterial: 259 mmHg — ABNORMAL HIGH (ref 80.0–100.0)
pO2, Arterial: 273 mmHg — ABNORMAL HIGH (ref 80.0–100.0)

## 2013-04-18 NOTE — Progress Notes (Signed)
See my note on same date

## 2013-04-19 NOTE — Discharge Summary (Signed)
Name: [name] [name] MRN: [id number] DOB: [birth year]    ADMISSION DATE:  2013-05-09 CONSULTATION DATE: 1/19   REFERRING MD : Lovie Macadamia   PRIMARY SERVICE: PCCM   CHIEF COMPLAINT: Sepsis due to C. Diff colitis   BRIEF PATIENT DESCRIPTION:  80 yo caucasian female transferred from [facility] with sepsis due to severe C. Diff colitis with leukemoid reaction, afib-RVR, infected R TKA (s/p multiple surgeries). Patient arrived with PICC line, peripheral IV, amiodarone, and vancomycin. Admitted to PCCM for sepsis management and possible pressors for hypotension if needed.   SIGNIFICANT EVENTS / STUDIES:  1/16 CT abdomen - no megacolon  1/19 - Transfer from [place] due to C. Diff colitis  1/19 Abdominal xray - Colonic ileus with edema in the left colon. Negative for bowel obstruction.  LINES / TUBES:  PICC (right basilic) 25/9/56 >>>  Foley 1/17 >>>  PIV 1/17 >>>   CULTURES:  none   ANTIBIOTICS:  Vancomycin IV (1/19>>> Vancomycin PO (1/19>>> Flagyl (1/19>>>  HISTORY OF PRESENT ILLNESS: 80 yo female with PMH uncontrolled afib, chronic anemia, CHF (Difficult to assess LVEF per 07/2011 echo), chronic infected Right TKA (s/p 4 I&Ds, 01/28/13, 02/06/13, 02/15/13, 02/25/13) treated with vancomycin via PICC until 03/29/13. Patient presented with sepsis due to C. Diff colitis and in afib with RVR.  [facility] hospital course included IV flagyl, po vanc, IV KCL, cardizem drip, amio drip, and IV lopressor. Lab workup revealed a WBC 73K>>168K, positive C. Diff colitis from splenic flexure to rectum per CT abd/pelvis.  She is DNR admitted to [facility] for care.     CXR 1/20 - A small right pleural effusion is suspected. The pulmonary interstitial markings on the right are more conspicuous today than in the past. The lung volumes remain low. The cardiopericardial silhouette is not enlarged. The central pulmonary vascularity is more conspicuous today which may indicate an element of underlying CHF.  COURSE  :  PULMONARY  A:  Hx bronchitis  Small R pleural effusion P:  O2 support to maintain >92%   CARDIOVASCULAR  A:  Uncontrolled afib with RVR CHF (EF unknown - mildly depressed per 07/2011 echo) Hypotension  P:  Continue NS @ 75, will bolus with 500cc NS Continue amiodarone, diltiazem -keep rate 120 or lower    RENAL  A:  No hx renal disease  Hypokalemia - resolved Hyponatremia Hypocalcemia - corrected Ca WNL   GASTROINTESTINAL  A:  Severe C. Diff colitis  Suspected toxic megacolon Edematous left colon with suspected ileus - no obstruction GERD  Hx GIB  Hypoalbuminemia P:  Abx per ID  No indication for PPI ppx  Continue dysphagia diet Consider NGT for decompression if n/v or worsening abdominal pain/distension   surgery consulted & taken to OR  HEMATOLOGIC  A: Leukemoid reaction - WBC 184k !! Anemia (baseline Hgb ~9)  P:  Follow CBC  Consider FOBT if Hgb falls  Lovenox for DVT ppx  Heme consult to review PS   INFECTIOUS  A:  Multiple I&Ds to R TKA (antibiotic spacer) Hx enterococcus & Grp B strep wound infection  C. Diff colitis  Leukocytosis, afebrile  Lactic acid mild elevation, 2.6  P:  Vanc PO, Vanc IV, Flagyl IV per Rx  ID consulted   ENDOCRINE  A:  Hypothyroid  P:  Check TSH   NEUROLOGIC  A:  No active issues  P:  Will follow    Course - She  Was taken to OR , underwent Subtotal colectomy with ileostomy, developed  post op refractory shock & passed away.  Cause of death - Toxic megacolon due to c diff colitis, septic shock, Atrial fibrillation      [facility]  Pulmonary and Critical Care Medicine [facility] Pager: [phone]  04/19/2013, 1:17 AM

## 2013-04-28 NOTE — Addendum Note (Signed)
Addendum created 04-29-13 [id number] by Lavina Hamman, CRNA   Modules edited: Anesthesia LDA

## 2013-04-28 NOTE — Progress Notes (Addendum)
CRITICAL VALUE ALERT  Critical value received: Ca 5.9  Date of notification:  04/05/2013  Time of notification:  2000  Critical value read back:yes  Nurse who received alert:  [place]  MD notified (1st page):  Dr. Nelda Marseille  Time of first page:  2015  MD notified (2nd page):  Time of second page:  Responding MD:  Dr. Nelda Marseille  Time MD responded:  2030

## 2013-04-28 NOTE — Op Note (Signed)
[name], [name] [name]                 ACCOUNT NO.:  [id number]  MEDICAL RECORD NO.:  [id number]  LOCATION:  2H11C                        FACILITY:  [facility]  PHYSICIAN:  Coralie Keens, M.D. DATE OF BIRTH:  12-12-1930  DATE OF PROCEDURE:  04/04/2013 DATE OF DISCHARGE:                              OPERATIVE REPORT   PREOPERATIVE DIAGNOSES:  Clostridium difficile colitis with toxic megacolon and sepsis.  POSTOPERATIVE DIAGNOSES:  Clostridium difficile colitis with toxic megacolon and sepsis.  PROCEDURE:  Subtotal colectomy with ileostomy.  SURGEON:  Coralie Keens, M.D.  ANESTHESIA:  General endotracheal anesthesia.  ESTIMATED BLOOD LOSS:  Minimal.  INDICATIONS:  This is an 80 year old female who has been on long-term antibiotics for recurrent infections in her knee.  She developed C. difficile colitis and became acutely ill with a CAT scan showing diffuse edema of the colon.  Her white blood count increased to 186,000.  Given these findings, decision was made to proceed to the operating room for subtotal colectomy after a long discussion with the family.  FINDINGS:  The patient was found to have distention and thickening of her entire colon with edema in the colonic mesentery and a large amount of turbid fluid in the abdomen.  PROCEDURE IN DETAIL:  The patient was brought to the operating room, identified as [name] [name].  She was placed supine on the operating table and general anesthesia was induced.  Her abdomen was then prepped and draped in usual sterile fashion.  Using scalpel, I created a midline incision from xiphoid to the pubis.  I took this down through the fascia with electrocautery.  I then opened the fascia the entire length of the incision.  Upon entering the abdomen, the patient was found to have a distended colon especially at the transverse colon.  The entire colon was thick-walled and edematous.  There was a large amount of turbid fluid in the abdomen.  All  the fluid was suctioned free.  I then identified the rectum and transected it with a contour stapler.  I then mobilized the left colon along the white line of Toldt and then took down the mesentery with the ligature.  I then took down the hepatic flexure with the ligature as well.  I then worked across transverse colon taking the omentum off with electrocautery.  I then identified the terminal ileum, and transected with a GIA 75 stapler.  I then mobilized the right colon along the white line of Toldt as well and then took down the mesentery with the ligature also.  I then worked my way towards the middle colic vessel, which I controlled with a clamp and silk tie and completed the transection of the rest of the mesentery with the ligature.  I then removed the entire colon from the field and sent to Pathology for evaluation.  I had to place a suture in the left gonadal vein for hemostasis.  Achieved hemostasis in the left upper quadrant with the ligature as well.  I evaluated the spleen and it was found to be intact.  The small bowel from ligament of Treitz to the terminal ileum was normal as well.  I placed a piece of Surgicel  SNoW in the left upper quadrant as well as in the pelvis.  Again hemostasis appeared to be achieved.  I then thoroughly irrigated the abdomen with saline.  I made an elliptical incision in the patient's right lower quadrant.  I took this down to the fascia, which I opened in a cruciate fashion.  I then separated the underlying abdominal wall muscles and opened the peritoneum.  I then pulled out small bowel at the staple line of the ileum as the ostomy.  I then closed the patient's midline fascia with a running #1 looped PDS suture as well as interrupted #1 Novafil internal retention sutures.  The skin was then packed open.  I then opened up the staple line at the ileum and created ileostomy circumferentially with interrupted 3-0 Vicryl sutures.  An ostomy appliance  was then applied. The patient appeared to tolerate the procedure fairly well.  All sponge, needle, and instrument counts were correct at the end of the procedure. She remained in a critical condition, was transferred, intubated from the operating room to the recovery room.     Coralie Keens, M.D.     DB/MEDQ  D:  03/31/2013  T:  04-25-2013  Job:  [id number]

## 2013-04-28 NOTE — Progress Notes (Signed)
CRITICAL VALUE ALERT  Critical value received:  Hgb 6  Date of notification:  03/30/2013  Time of notification:  1945  Critical value read back:yes  Nurse who received alert:  Martinique Perkins  MD notified (1st page):  Dr. Nelda Marseille  Time of first page:  1945  MD notified (2nd page):  Time of second page:  Responding MD:  Dr. Nelda Marseille  Time MD responded:  [phone]

## 2013-04-28 NOTE — Progress Notes (Signed)
Pt passed away peacefully at 2101. Family decided to not esscalate care any further. Asystole on monitor. See strip. Family at bedside. Pt comfortable. Absence of breath sounds auscultated by myself and May x2 minutes. Chaplain offered. [place] donor notified. CCM notified.

## 2013-04-28 DEATH — deceased

## 2013-05-24 NOTE — Progress Notes (Signed)
This encounter was created in error - please disregard.

## 2013-06-23 IMAGING — CR DG ABDOMEN ACUTE W/ 1V CHEST
3 series · 3 of 3 positions shown · non-contrast
Comparison: 07/10/2008

CLINICAL DATA: Abdominal pain, nausea and vomiting.

ACUTE ABDOMEN SERIES (ABDOMEN 2 VIEW & CHEST 1 VIEW)

[w abdomen decub]
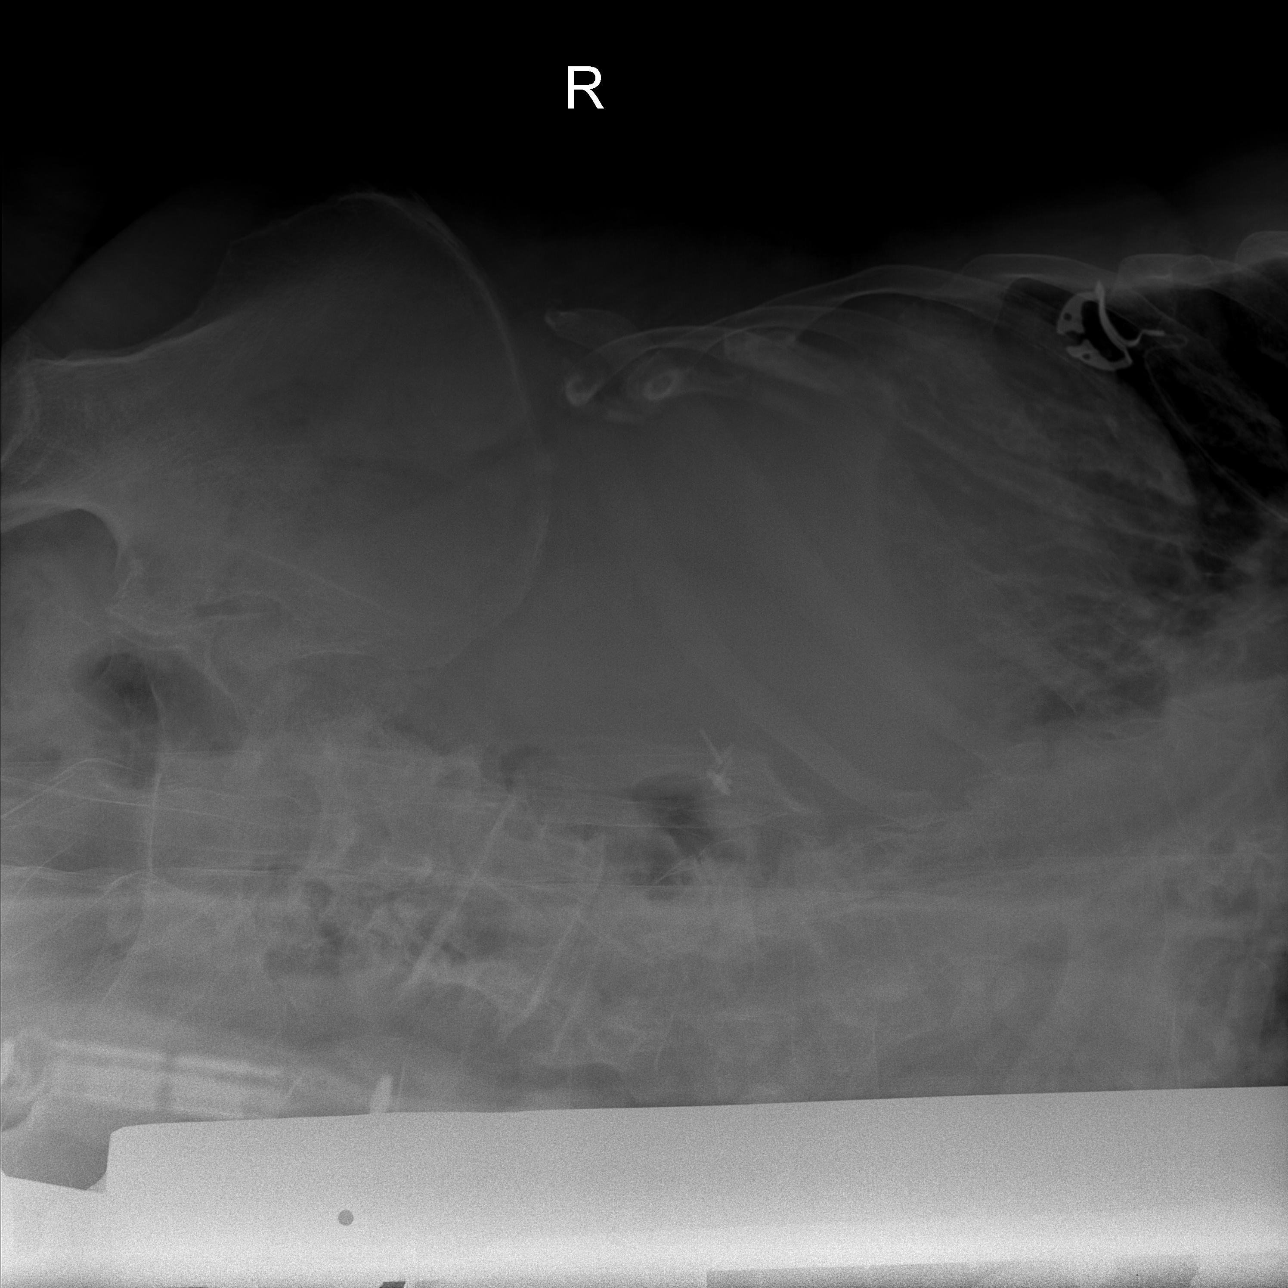

[view not recorded (1 of 2)]
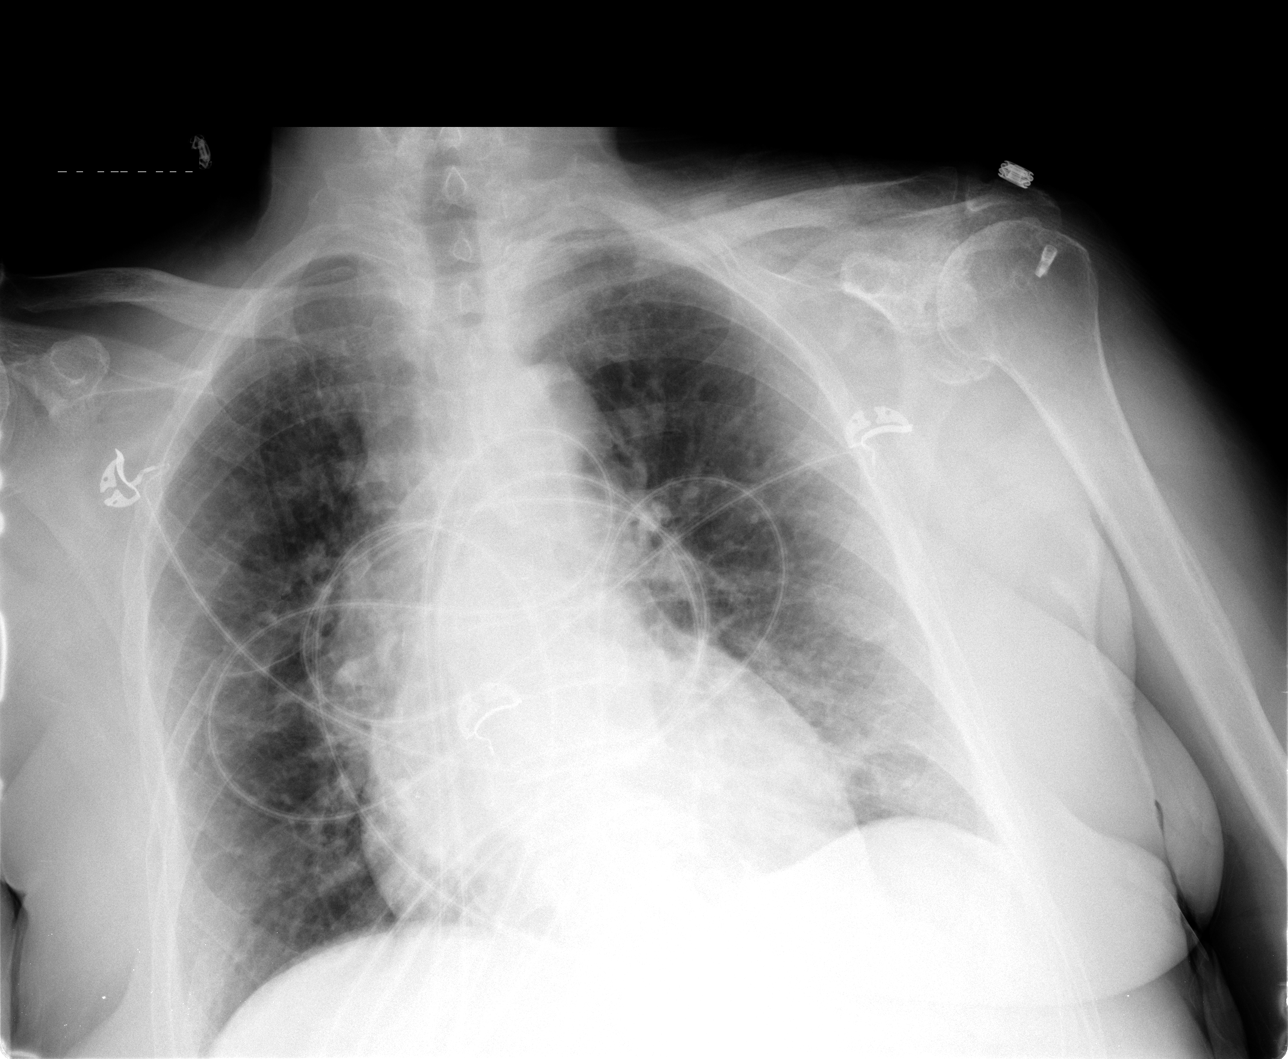

[view not recorded (2 of 2)]
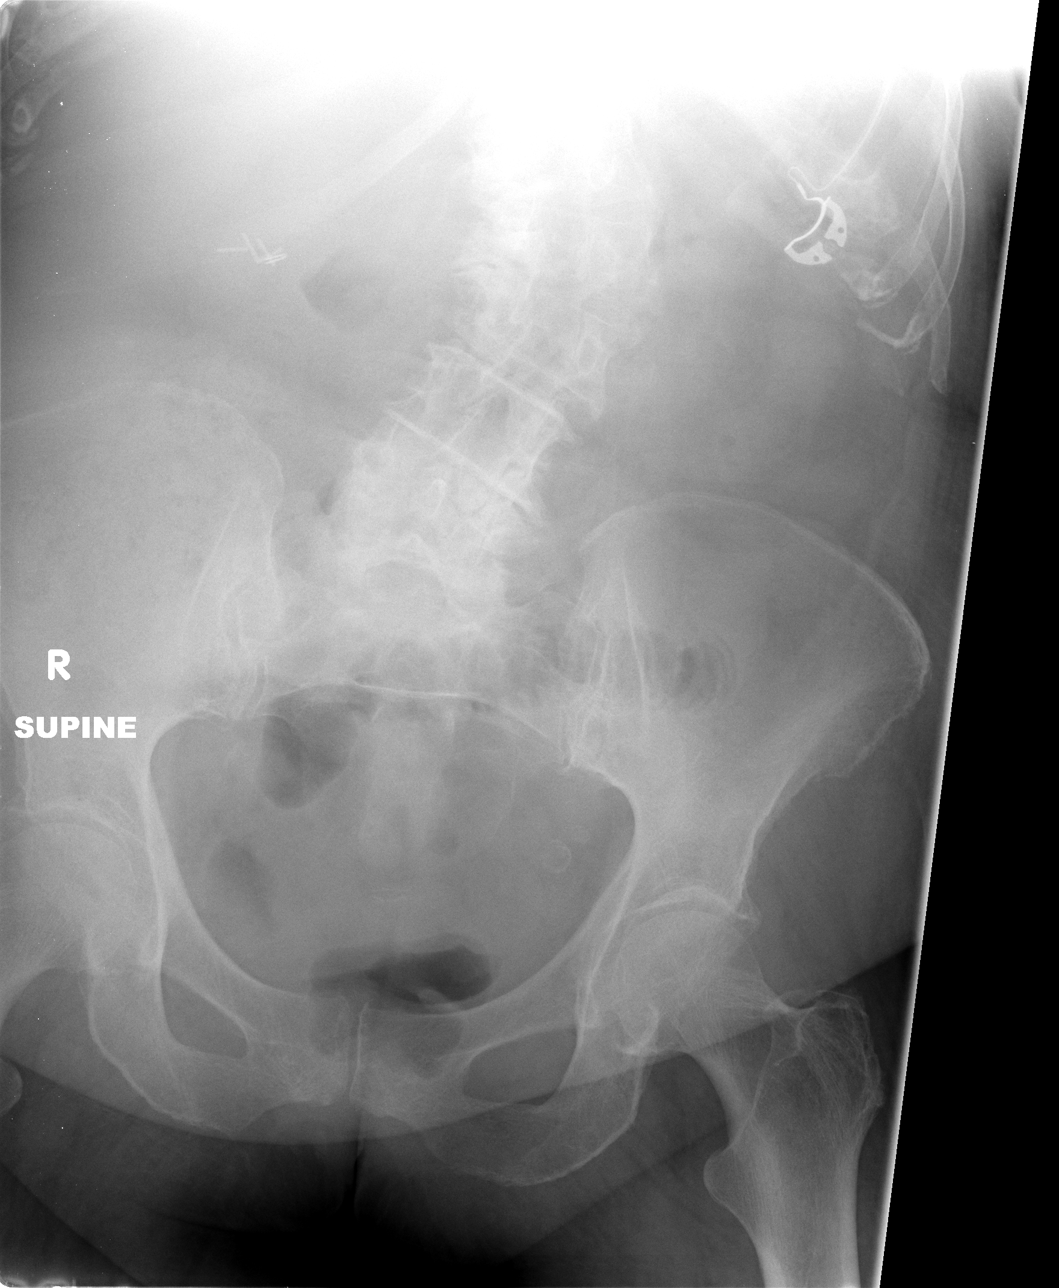

[3 of 3 positions shown; findings below may reference images not displayed]

FINDINGS: Cholecystectomy clips noted.  No free air identified.
Leftward curvature of the lumbar spine again noted.  No dilated gas
filled loop of bowel.  No differential air-fluid level.

Mild cardiomegaly noted.  Central vascular congestion without overt
edema.  Patchy retrocardiac possible airspace opacities noted.
Trace right pleural effusion.
IMPRESSION: Nonobstructive bowel gas pattern.

Cardiomegaly with retrocardiac airspace opacity.  Please see
dedicated report dictated separately.

## 2014-12-08 IMAGING — CR DG HUMERUS 2V *L*
3 series · 3 of 3 positions shown · non-contrast
Comparison: None.

CLINICAL DATA: Fall. Left shoulder pain

EXAM:
LEFT HUMERUS - 2+ VIEW

[x humerus ap left]
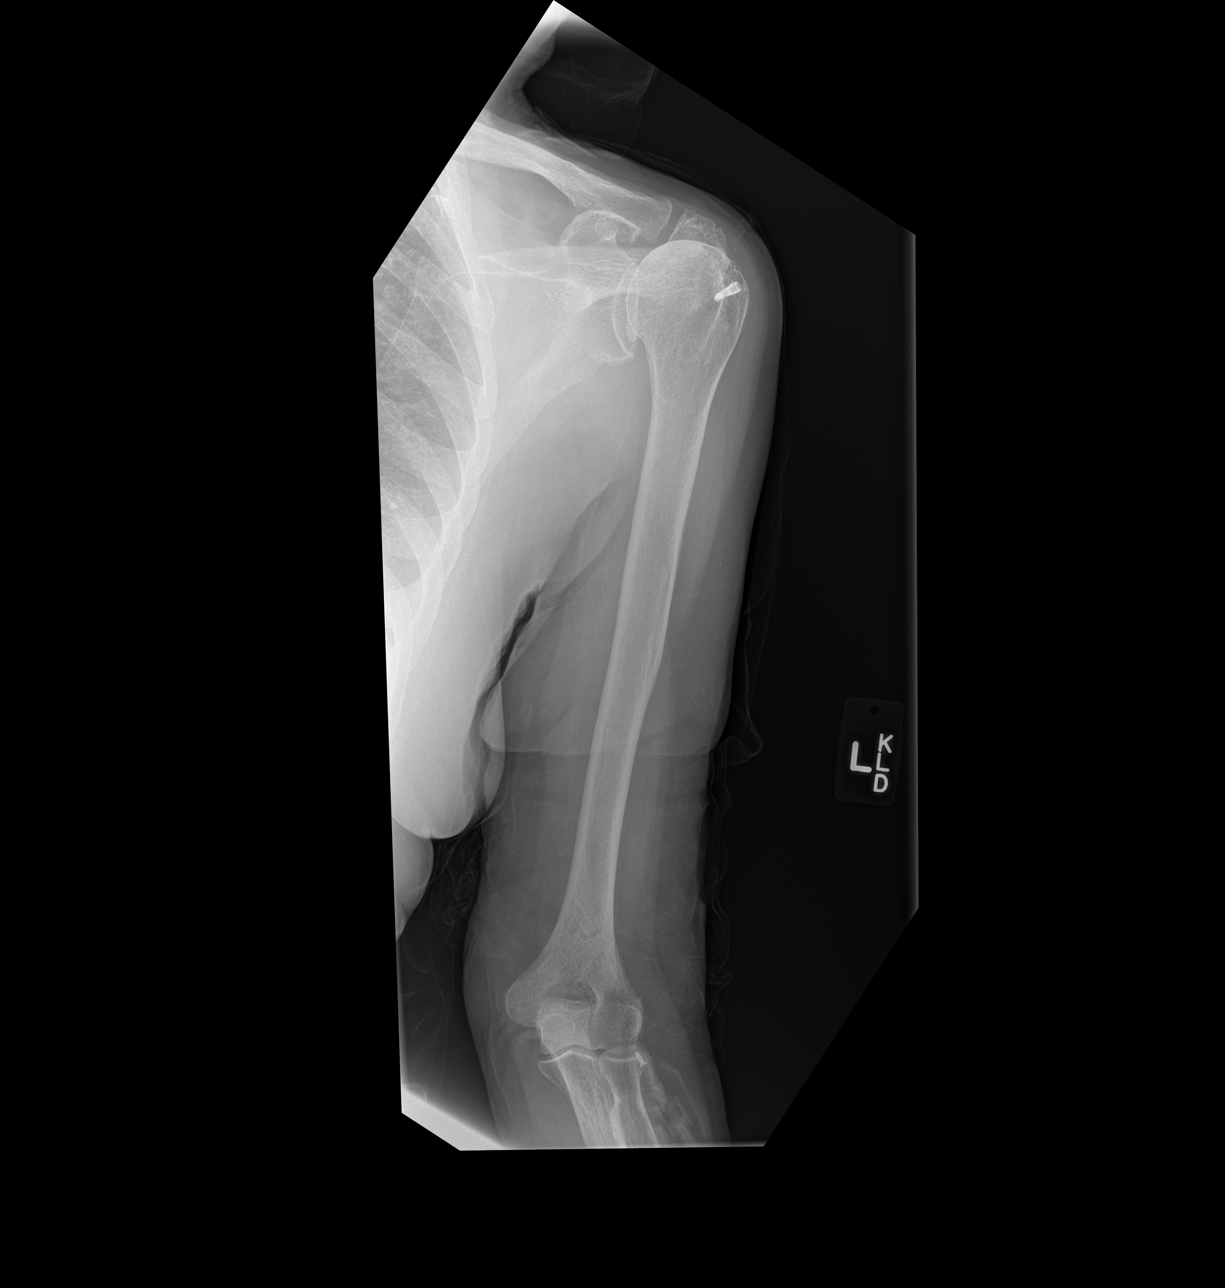

[x humerus lat left (1 of 2)]
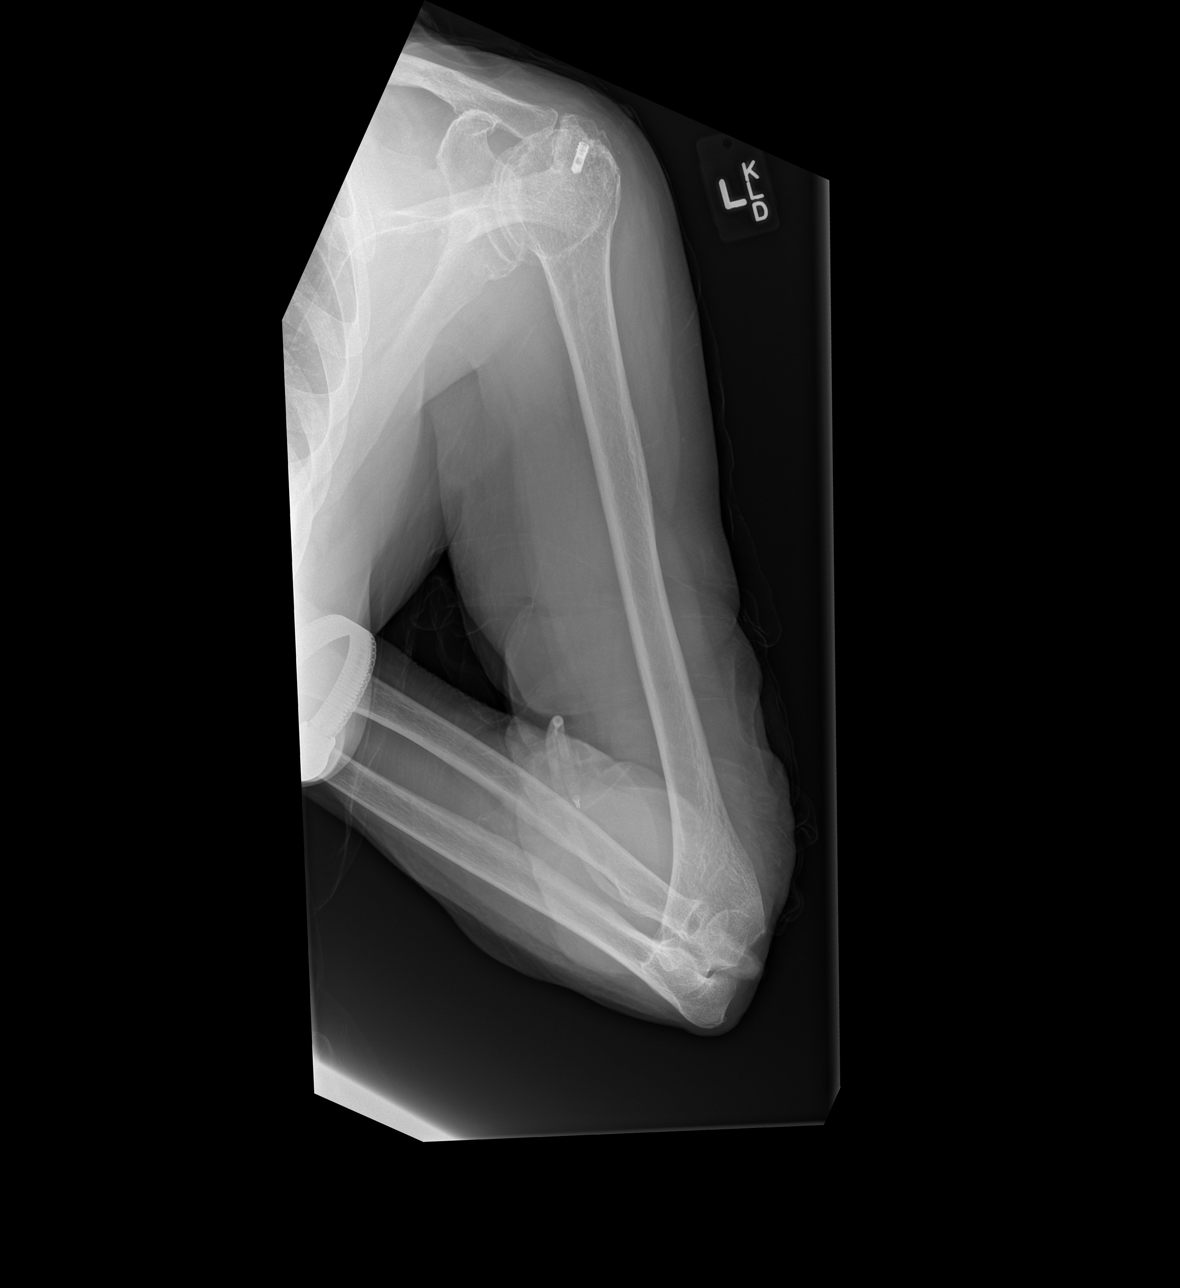

[x humerus lat left (2 of 2)]
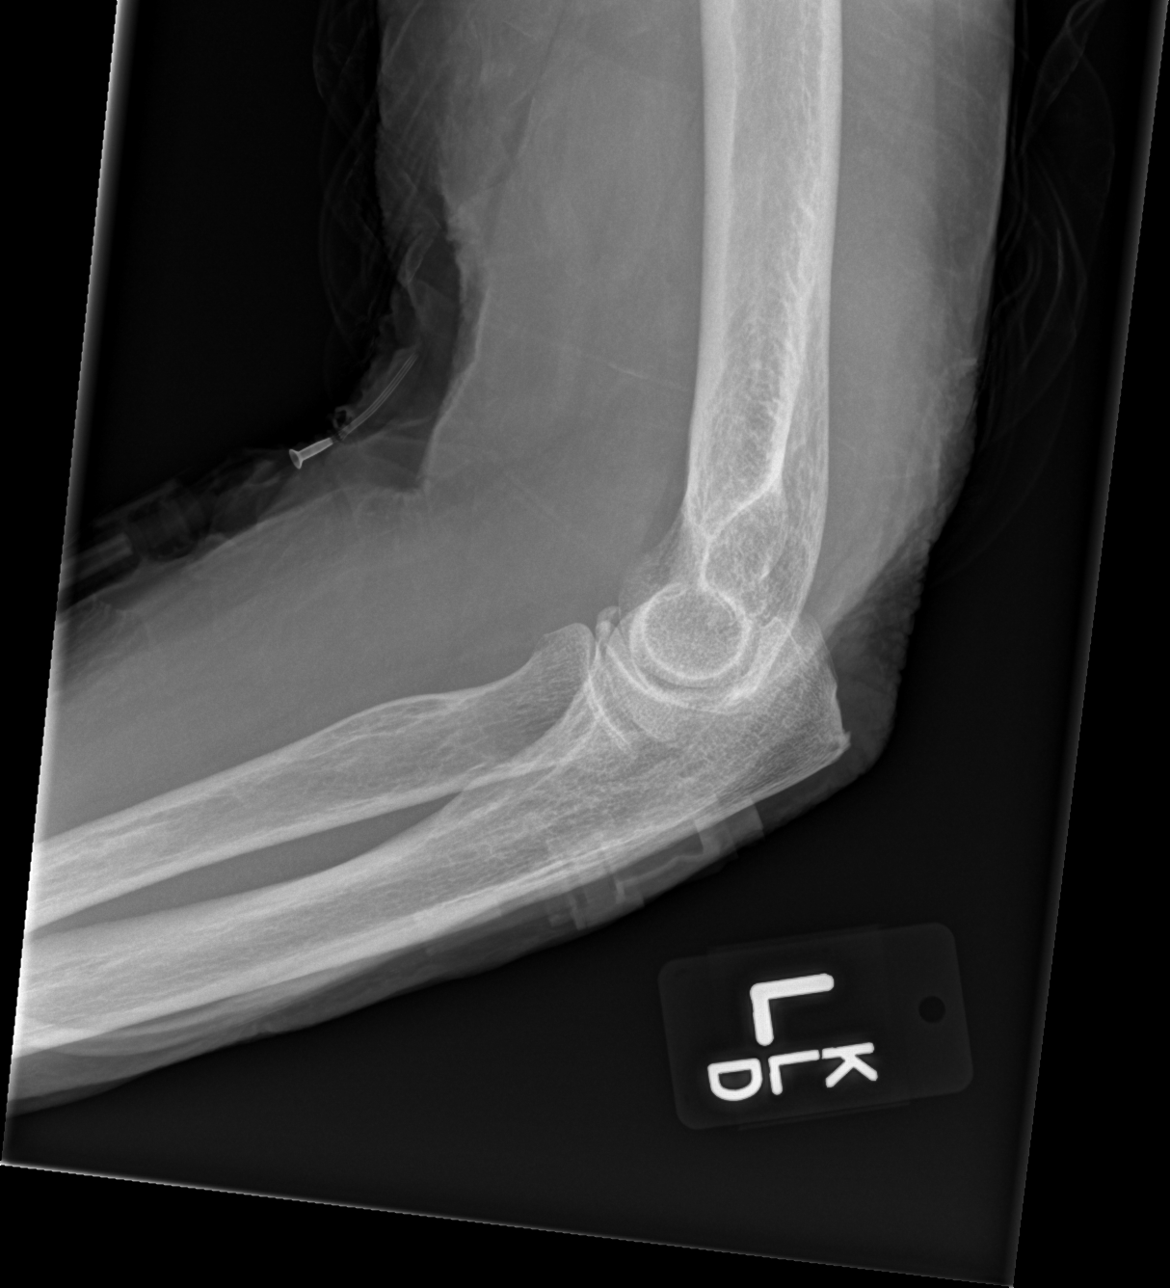

[3 of 3 positions shown; findings below may reference images not displayed]

FINDINGS: No evidence of acute fracture. The humerus appears located. There is
glenohumeral osteoarthritis with marginal spurs. Surgical anchor
present in the proximal humerus. Osteopenia.
IMPRESSION: Negative for acute osseous injury.
